# Patient Record
Sex: Male | Born: 1940 | Race: White | Hispanic: No | Marital: Married | Smoking: Former smoker
Health system: Southern US, Community
[De-identification: ages and names within clinical notes are randomized; demographics above are authoritative.]

## PROBLEM LIST (undated history)

## (undated) DIAGNOSIS — E119 Type 2 diabetes mellitus without complications: Secondary | ICD-10-CM

## (undated) DIAGNOSIS — I2510 Atherosclerotic heart disease of native coronary artery without angina pectoris: Secondary | ICD-10-CM

## (undated) DIAGNOSIS — Z8612 Personal history of poliomyelitis: Secondary | ICD-10-CM

## (undated) DIAGNOSIS — I714 Abdominal aortic aneurysm, without rupture: Secondary | ICD-10-CM

## (undated) DIAGNOSIS — N183 Chronic kidney disease, stage 3 unspecified: Secondary | ICD-10-CM

## (undated) DIAGNOSIS — I10 Essential (primary) hypertension: Secondary | ICD-10-CM

## (undated) DIAGNOSIS — K635 Polyp of colon: Secondary | ICD-10-CM

## (undated) DIAGNOSIS — M1990 Unspecified osteoarthritis, unspecified site: Secondary | ICD-10-CM

## (undated) DIAGNOSIS — I6521 Occlusion and stenosis of right carotid artery: Secondary | ICD-10-CM

## (undated) DIAGNOSIS — E785 Hyperlipidemia, unspecified: Secondary | ICD-10-CM

## (undated) DIAGNOSIS — I739 Peripheral vascular disease, unspecified: Secondary | ICD-10-CM

## (undated) DIAGNOSIS — G4730 Sleep apnea, unspecified: Secondary | ICD-10-CM

## (undated) DIAGNOSIS — G709 Myoneural disorder, unspecified: Secondary | ICD-10-CM

## (undated) HISTORY — DX: Peripheral vascular disease, unspecified: I73.9

## (undated) HISTORY — DX: Personal history of poliomyelitis: Z86.12

## (undated) HISTORY — DX: Occlusion and stenosis of right carotid artery: I65.21

## (undated) HISTORY — DX: Chronic kidney disease, stage 3 unspecified: N18.30

## (undated) HISTORY — DX: Essential (primary) hypertension: I10

## (undated) HISTORY — PX: CATARACT EXTRACTION W/ INTRAOCULAR LENS IMPLANT: SHX1309

## (undated) HISTORY — PX: TONSILLECTOMY: SUR1361

## (undated) HISTORY — DX: Type 2 diabetes mellitus without complications: E11.9

## (undated) HISTORY — DX: Polyp of colon: K63.5

## (undated) HISTORY — DX: Atherosclerotic heart disease of native coronary artery without angina pectoris: I25.10

## (undated) HISTORY — DX: Myoneural disorder, unspecified: G70.9

## (undated) HISTORY — DX: Abdominal aortic aneurysm, without rupture: I71.4

## (undated) HISTORY — DX: Chronic kidney disease, stage 3 (moderate): N18.3

## (undated) HISTORY — PX: CORONARY ANGIOPLASTY: SHX604

## (undated) HISTORY — DX: Hyperlipidemia, unspecified: E78.5

---
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
## 2006-03-13 LAB — HM COLONOSCOPY

## 2009-12-15 ENCOUNTER — Encounter: Admission: RE | Admit: 2009-12-15 | Discharge: 2009-12-15 | Payer: Self-pay | Admitting: Family Medicine

## 2010-03-17 ENCOUNTER — Ambulatory Visit (HOSPITAL_COMMUNITY)
Admission: RE | Admit: 2010-03-17 | Discharge: 2010-03-17 | Payer: Self-pay | Source: Home / Self Care | Attending: Family Medicine | Admitting: Family Medicine

## 2010-10-12 DIAGNOSIS — I714 Abdominal aortic aneurysm, without rupture, unspecified: Secondary | ICD-10-CM

## 2010-10-12 HISTORY — DX: Abdominal aortic aneurysm, without rupture, unspecified: I71.40

## 2010-10-12 HISTORY — DX: Abdominal aortic aneurysm, without rupture: I71.4

## 2010-10-17 ENCOUNTER — Other Ambulatory Visit: Payer: Self-pay | Admitting: Family Medicine

## 2010-10-17 DIAGNOSIS — M545 Low back pain: Secondary | ICD-10-CM

## 2010-10-17 DIAGNOSIS — M5410 Radiculopathy, site unspecified: Secondary | ICD-10-CM

## 2010-10-19 ENCOUNTER — Ambulatory Visit (HOSPITAL_COMMUNITY)
Admission: RE | Admit: 2010-10-19 | Discharge: 2010-10-19 | Disposition: A | Discharge status: Home / Self Care | Payer: Medicare HMO | Source: Ambulatory Visit | Attending: Family Medicine | Admitting: Family Medicine

## 2010-10-19 DIAGNOSIS — M51379 Other intervertebral disc degeneration, lumbosacral region without mention of lumbar back pain or lower extremity pain: Secondary | ICD-10-CM | POA: Insufficient documentation

## 2010-10-19 DIAGNOSIS — M545 Low back pain, unspecified: Secondary | ICD-10-CM

## 2010-10-19 DIAGNOSIS — M5410 Radiculopathy, site unspecified: Secondary | ICD-10-CM

## 2010-10-19 DIAGNOSIS — M5137 Other intervertebral disc degeneration, lumbosacral region: Secondary | ICD-10-CM | POA: Insufficient documentation

## 2010-11-04 ENCOUNTER — Other Ambulatory Visit: Payer: Self-pay | Admitting: Family Medicine

## 2010-11-04 DIAGNOSIS — M545 Low back pain: Secondary | ICD-10-CM

## 2010-11-04 DIAGNOSIS — M79604 Pain in right leg: Secondary | ICD-10-CM

## 2010-11-07 ENCOUNTER — Ambulatory Visit
Admission: RE | Admit: 2010-11-07 | Discharge: 2010-11-07 | Disposition: A | Discharge status: Home / Self Care | Payer: Medicare HMO | Source: Ambulatory Visit | Attending: Family Medicine | Admitting: Family Medicine

## 2010-11-07 DIAGNOSIS — M79604 Pain in right leg: Secondary | ICD-10-CM

## 2010-11-07 DIAGNOSIS — M545 Low back pain: Secondary | ICD-10-CM

## 2010-11-07 MED ORDER — METHYLPREDNISOLONE ACETATE 40 MG/ML INJ SUSP (RADIOLOG
120.0000 mg | Freq: Once | INTRAMUSCULAR | Status: AC
  Administered 2010-11-07: 120 mg via EPIDURAL

## 2010-11-07 MED ORDER — IOHEXOL 180 MG/ML  SOLN
1.0000 mL | Freq: Once | INTRAMUSCULAR | Status: AC | PRN
  Administered 2010-11-07: 1 mL via EPIDURAL

## 2010-12-02 ENCOUNTER — Other Ambulatory Visit: Payer: Self-pay | Admitting: Family Medicine

## 2010-12-02 DIAGNOSIS — M79604 Pain in right leg: Secondary | ICD-10-CM

## 2010-12-08 ENCOUNTER — Other Ambulatory Visit: Payer: Self-pay | Admitting: Family Medicine

## 2010-12-08 ENCOUNTER — Ambulatory Visit
Admission: RE | Admit: 2010-12-08 | Discharge: 2010-12-08 | Disposition: A | Discharge status: Home / Self Care | Payer: Medicare HMO | Source: Ambulatory Visit | Attending: Family Medicine | Admitting: Family Medicine

## 2010-12-08 DIAGNOSIS — M79604 Pain in right leg: Secondary | ICD-10-CM

## 2010-12-08 MED ORDER — IOHEXOL 180 MG/ML  SOLN
1.0000 mL | Freq: Once | INTRAMUSCULAR | Status: AC | PRN
  Administered 2010-12-08: 1 mL via EPIDURAL

## 2010-12-08 MED ORDER — METHYLPREDNISOLONE ACETATE 40 MG/ML INJ SUSP (RADIOLOG
120.0000 mg | Freq: Once | INTRAMUSCULAR | Status: AC
  Administered 2010-12-08: 120 mg via EPIDURAL

## 2011-12-01 ENCOUNTER — Other Ambulatory Visit: Payer: Self-pay | Admitting: Family Medicine

## 2011-12-01 DIAGNOSIS — I714 Abdominal aortic aneurysm, without rupture: Secondary | ICD-10-CM

## 2011-12-07 ENCOUNTER — Ambulatory Visit (HOSPITAL_COMMUNITY)
Admission: RE | Admit: 2011-12-07 | Discharge: 2011-12-07 | Disposition: A | Discharge status: Home / Self Care | Payer: Medicare Other | Source: Ambulatory Visit | Attending: Family Medicine | Admitting: Family Medicine

## 2011-12-07 ENCOUNTER — Other Ambulatory Visit: Payer: Self-pay | Admitting: Family Medicine

## 2011-12-07 DIAGNOSIS — E7800 Pure hypercholesterolemia, unspecified: Secondary | ICD-10-CM | POA: Insufficient documentation

## 2011-12-07 DIAGNOSIS — I714 Abdominal aortic aneurysm, without rupture, unspecified: Secondary | ICD-10-CM

## 2011-12-07 DIAGNOSIS — E119 Type 2 diabetes mellitus without complications: Secondary | ICD-10-CM | POA: Insufficient documentation

## 2011-12-07 DIAGNOSIS — I10 Essential (primary) hypertension: Secondary | ICD-10-CM | POA: Insufficient documentation

## 2011-12-27 ENCOUNTER — Encounter: Payer: Self-pay | Admitting: *Deleted

## 2011-12-27 ENCOUNTER — Encounter: Payer: Self-pay | Admitting: Vascular Surgery

## 2012-01-01 ENCOUNTER — Encounter: Payer: Self-pay | Admitting: Vascular Surgery

## 2012-01-02 ENCOUNTER — Ambulatory Visit (INDEPENDENT_AMBULATORY_CARE_PROVIDER_SITE_OTHER): Payer: Medicare Other | Admitting: Vascular Surgery

## 2012-01-02 ENCOUNTER — Encounter: Payer: Self-pay | Admitting: Vascular Surgery

## 2012-01-02 VITALS — BP 169/69 | HR 97 | Resp 20 | Ht 71.0 in | Wt 233.0 lb

## 2012-01-02 DIAGNOSIS — I714 Abdominal aortic aneurysm, without rupture, unspecified: Secondary | ICD-10-CM | POA: Insufficient documentation

## 2012-01-02 NOTE — Progress Notes (Signed)
Subjective:     Patient ID: Juan [name], male   DOB: March 13, 1941, 71 y.o.   MRN: [id number]  HPI this 71 year old male is referred for examination regarding abdominal aortic aneurysm. Patient had an MR scan about 14 months ago for back problems and was found to have a 3.5 cm infrarenal abdominal aortic aneurysm. He had injections for his back problem and that his resolved. In September of this year he had ultrasound which revealed enlargement of the aneurysm to 3.7 x 4.1 cm in maximum diameter. Patient has had no abnormal or new back symptoms.  Past Medical History  Diagnosis Date  . Diabetes mellitus without complication   . Hypertension   . Hyperlipidemia   . AAA (abdominal aortic aneurysm)   . CAD (coronary artery disease)     History  Substance Use Topics  . Smoking status: Former Smoker -- 35 years    Types: Cigarettes    Quit date: 01/02/1988  . Smokeless tobacco: Never Used  . Alcohol Use: Yes    Family History  Problem Relation Age of Onset  . Diabetes Mother   . Heart disease Mother   . Diabetes Father   . Heart disease Father     No Known Allergies  Current outpatient prescriptions:allopurinol (ZYLOPRIM) 300 MG tablet, Take 300 mg by mouth daily., Disp: , Rfl: ;  aspirin 325 MG EC tablet, Take 325 mg by mouth daily., Disp: , Rfl: ;  diltiazem (DILACOR XR) 240 MG 24 hr capsule, Take 240 mg by mouth daily., Disp: , Rfl: ;  glipiZIDE (GLUCOTROL) 5 MG tablet, Take 5 mg by mouth 2 (two) times daily before a meal., Disp: , Rfl:  lisinopril (PRINIVIL,ZESTRIL) 40 MG tablet, Take 40 mg by mouth daily., Disp: , Rfl: ;  metFORMIN (GLUCOPHAGE) 1000 MG tablet, Take 1,000 mg by mouth 2 (two) times daily with a meal., Disp: , Rfl: ;  simvastatin (ZOCOR) 20 MG tablet, Take 20 mg by mouth every evening., Disp: , Rfl: ;  hydrochlorothiazide (HYDRODIURIL) 25 MG tablet, Take 25 mg by mouth daily., Disp: , Rfl:   BP 169/69  Pulse 97  Resp 20  Ht 5\' 11"  (1.803 m)  Wt 233 lb (105.688  kg)  BMI 32.50 kg/m2  Body mass index is 32.50 kg/(m^2).          Review of Systems denies chest pain, dyspnea on exertion, PND, orthopnea, claudication. Has a remote history of PT CA in 1989 this had no cardiac symptoms since that time. Other systems negative and complete review of systems    Objective:   Physical Exam blood pressure 169/69 heart rate 97 respirations 20 Gen.-alert and oriented x3 in no apparent distress HEENT normal for age Lungs no rhonchi or wheezing Cardiovascular regular rhythm no murmurs carotid pulses 3+ palpable no bruits audible Abdomen soft nontender no palpable masses Musculoskeletal free of  major deformities Skin clear -no rashes Neurologic normal Lower extremities 3+ femoral and dorsalis pedis pulses palpable bilaterally with no edema  I reviewed the ultrasound study report performed at [facility] on 12/07/2011 which reveals 3.7 x 4.1 cm AAA     Assessment:     3.7 x 4.1 cm AAA the ultrasound September of 2013    Plan:     Return in one year with duplex scan of the abdominal aortic aneurysm and see nurse practitioner

## 2012-01-03 NOTE — Addendum Note (Signed)
Addended by: Sharee Pimple on: 01/03/2012 08:35 AM   Modules accepted: Orders

## 2012-05-11 ENCOUNTER — Encounter: Payer: Self-pay | Admitting: Family Medicine

## 2012-05-11 DIAGNOSIS — E119 Type 2 diabetes mellitus without complications: Secondary | ICD-10-CM | POA: Insufficient documentation

## 2012-05-11 DIAGNOSIS — I2510 Atherosclerotic heart disease of native coronary artery without angina pectoris: Secondary | ICD-10-CM | POA: Insufficient documentation

## 2012-05-11 DIAGNOSIS — E785 Hyperlipidemia, unspecified: Secondary | ICD-10-CM | POA: Insufficient documentation

## 2012-08-20 ENCOUNTER — Other Ambulatory Visit: Payer: Self-pay | Admitting: Family Medicine

## 2012-08-20 DIAGNOSIS — Z79899 Other long term (current) drug therapy: Secondary | ICD-10-CM

## 2012-08-20 DIAGNOSIS — E785 Hyperlipidemia, unspecified: Secondary | ICD-10-CM

## 2012-08-20 DIAGNOSIS — Z125 Encounter for screening for malignant neoplasm of prostate: Secondary | ICD-10-CM

## 2012-08-20 DIAGNOSIS — E119 Type 2 diabetes mellitus without complications: Secondary | ICD-10-CM

## 2012-08-22 ENCOUNTER — Other Ambulatory Visit (INDEPENDENT_AMBULATORY_CARE_PROVIDER_SITE_OTHER): Payer: Medicare Other

## 2012-08-22 DIAGNOSIS — E785 Hyperlipidemia, unspecified: Secondary | ICD-10-CM

## 2012-08-22 DIAGNOSIS — Z79899 Other long term (current) drug therapy: Secondary | ICD-10-CM

## 2012-08-22 DIAGNOSIS — Z125 Encounter for screening for malignant neoplasm of prostate: Secondary | ICD-10-CM

## 2012-08-22 DIAGNOSIS — E119 Type 2 diabetes mellitus without complications: Secondary | ICD-10-CM

## 2012-08-22 LAB — CBC WITH DIFFERENTIAL/PLATELET
Basophils Absolute: 0 10*3/uL (ref 0.0–0.1)
Basophils Relative: 1 % (ref 0–1)
HCT: 37.9 % — ABNORMAL LOW (ref 39.0–52.0)
Lymphocytes Relative: 30 % (ref 12–46)
MCHC: 33.2 g/dL (ref 30.0–36.0)
Monocytes Absolute: 0.4 10*3/uL (ref 0.1–1.0)
Neutro Abs: 3.9 10*3/uL (ref 1.7–7.7)
Neutrophils Relative %: 59 % (ref 43–77)
RDW: 14.4 % (ref 11.5–15.5)
WBC: 6.5 10*3/uL (ref 4.0–10.5)

## 2012-08-22 LAB — COMPREHENSIVE METABOLIC PANEL
ALT: 32 U/L (ref 0–53)
AST: 24 U/L (ref 0–37)
Albumin: 4.3 g/dL (ref 3.5–5.2)
Alkaline Phosphatase: 59 U/L (ref 39–117)
BUN: 21 mg/dL (ref 6–23)
Calcium: 9.5 mg/dL (ref 8.4–10.5)
Chloride: 104 mEq/L (ref 96–112)
Potassium: 4.4 mEq/L (ref 3.5–5.3)
Sodium: 142 mEq/L (ref 135–145)
Total Protein: 6.4 g/dL (ref 6.0–8.3)

## 2012-08-22 LAB — LIPID PANEL
Cholesterol: 156 mg/dL (ref 0–200)
LDL Cholesterol: 77 mg/dL (ref 0–99)
Total CHOL/HDL Ratio: 4.3 Ratio
VLDL: 43 mg/dL — ABNORMAL HIGH (ref 0–40)

## 2012-08-27 ENCOUNTER — Encounter: Payer: Self-pay | Admitting: Family Medicine

## 2012-08-27 ENCOUNTER — Ambulatory Visit (INDEPENDENT_AMBULATORY_CARE_PROVIDER_SITE_OTHER): Payer: Medicare Other | Admitting: Family Medicine

## 2012-08-27 VITALS — BP 146/68 | HR 72 | Temp 98.0°F | Resp 16 | Ht 71.0 in | Wt 236.0 lb

## 2012-08-27 DIAGNOSIS — R198 Other specified symptoms and signs involving the digestive system and abdomen: Secondary | ICD-10-CM

## 2012-08-27 DIAGNOSIS — Z1211 Encounter for screening for malignant neoplasm of colon: Secondary | ICD-10-CM

## 2012-08-27 DIAGNOSIS — Z23 Encounter for immunization: Secondary | ICD-10-CM

## 2012-08-27 DIAGNOSIS — E1149 Type 2 diabetes mellitus with other diabetic neurological complication: Secondary | ICD-10-CM

## 2012-08-27 DIAGNOSIS — Z0000 Encounter for general adult medical examination without abnormal findings: Secondary | ICD-10-CM

## 2012-08-27 DIAGNOSIS — Z2911 Encounter for prophylactic immunotherapy for respiratory syncytial virus (RSV): Secondary | ICD-10-CM

## 2012-08-27 DIAGNOSIS — K6289 Other specified diseases of anus and rectum: Secondary | ICD-10-CM

## 2012-08-27 NOTE — Progress Notes (Signed)
Subjective:    Patient ID: Juan [name], male    DOB: 09/22/1940, 72 y.o.   MRN: [id number]  HPI  Patient is here today for complete physical exam.  He is also here to discuss his lab values with regards to his diabetes and hyperlipidemia. He also complains of a lump around his rectum that has been present for one month. He denies melena or hematochezia. He denies pain with defecation.  He is due for his next colonoscopy due to his history of colon polyps.  He has had a Pneumovax in 2005. He would like to get the shingle shot today. His tetanus shot is over due that he would like to defer today. Appointment on 08/22/2012  Component Date Value Range Status  . WBC 08/22/2012 6.5  4.0 - 10.5 K/uL Final  . RBC 08/22/2012 4.28  4.22 - 5.81 MIL/uL Final  . Hemoglobin 08/22/2012 12.6* 13.0 - 17.0 g/dL Final  . HCT 08/65/7846 37.9* 39.0 - 52.0 % Final  . MCV 08/22/2012 88.6  78.0 - 100.0 fL Final  . MCH 08/22/2012 29.4  26.0 - 34.0 pg Final  . MCHC 08/22/2012 33.2  30.0 - 36.0 g/dL Final  . RDW 96/29/5284 14.4  11.5 - 15.5 % Final  . Platelets 08/22/2012 161  150 - 400 K/uL Final  . Neutrophils Relative % 08/22/2012 59  43 - 77 % Final  . Neutro Abs 08/22/2012 3.9  1.7 - 7.7 K/uL Final  . Lymphocytes Relative 08/22/2012 30  12 - 46 % Final  . Lymphs Abs 08/22/2012 1.9  0.7 - 4.0 K/uL Final  . Monocytes Relative 08/22/2012 7  3 - 12 % Final  . Monocytes Absolute 08/22/2012 0.4  0.1 - 1.0 K/uL Final  . Eosinophils Relative 08/22/2012 3  0 - 5 % Final  . Eosinophils Absolute 08/22/2012 0.2  0.0 - 0.7 K/uL Final  . Basophils Relative 08/22/2012 1  0 - 1 % Final  . Basophils Absolute 08/22/2012 0.0  0.0 - 0.1 K/uL Final  . Smear Review 08/22/2012 Criteria for review not met   Final  . Sodium 08/22/2012 142  135 - 145 mEq/L Final  . Potassium 08/22/2012 4.4  3.5 - 5.3 mEq/L Final  . Chloride 08/22/2012 104  96 - 112 mEq/L Final  . CO2 08/22/2012 26  19 - 32 mEq/L Final  . Glucose, Bld  08/22/2012 179* 70 - 99 mg/dL Final  . BUN 13/24/4010 21  6 - 23 mg/dL Final  . Creat 27/25/3664 1.29  0.50 - 1.35 mg/dL Final  . Total Bilirubin 08/22/2012 0.4  0.3 - 1.2 mg/dL Final  . Alkaline Phosphatase 08/22/2012 59  39 - 117 U/L Final  . AST 08/22/2012 24  0 - 37 U/L Final  . ALT 08/22/2012 32  0 - 53 U/L Final  . Total Protein 08/22/2012 6.4  6.0 - 8.3 g/dL Final  . Albumin 40/34/7425 4.3  3.5 - 5.2 g/dL Final  . Calcium 95/63/8756 9.5  8.4 - 10.5 mg/dL Final  . Hemoglobin E3P 08/22/2012 6.4* <5.7 % Final   Comment:  According to the ADA Clinical Practice Recommendations for 2011, when                          HbA1c is used as a screening test:                                                       >=6.5%   Diagnostic of Diabetes Mellitus                                     (if abnormal result is confirmed)                                                     5.7-6.4%   Increased risk of developing Diabetes Mellitus                                                     References:Diagnosis and Classification of Diabetes Mellitus,Diabetes                          Care,2011,34(Suppl 1):S62-S69 and Standards of Medical Care in                                  Diabetes - 2011,Diabetes Care,2011,34 (Suppl 1):S11-S61.                             . Mean Plasma Glucose 08/22/2012 137* <117 mg/dL Final  . Cholesterol 16/12/9602 156  0 - 200 mg/dL Final   Comment: ATP III Classification:                                < 200        mg/dL        Desirable                               200 - 239     mg/dL        Borderline High                               >= 240        mg/dL        High                             . Triglycerides 08/22/2012 215* <150 mg/dL Final  . HDL 54/11/8117 36* >39 mg/dL Final  . Total CHOL/HDL Ratio 08/22/2012 4.3   Final  . VLDL 08/22/2012 43* 0 - 40 mg/dL Final  . LDL  Cholesterol 08/22/2012 77  0 - 99 mg/dL Final   Comment:                            Total Cholesterol/HDL Ratio:CHD Risk                                                 Coronary Heart Disease Risk Table                                                                 Men       Women                                   1/2 Average Risk              3.4        3.3                                       Average Risk              5.0        4.4                                    2X Average Risk              9.6        7.1                                    3X Average Risk             23.4       11.0                          Use the calculated Patient Ratio above and the CHD Risk table                           to determine the patient's CHD Risk.                          ATP III Classification (LDL):                                < 100        mg/dL         Optimal                               100 - 129     mg/dL  Near or Above Optimal                               130 - 159     mg/dL         Borderline High                               160 - 189     mg/dL         High                                > 190        mg/dL         Very High                             . PSA 08/22/2012 0.61  <=4.00 ng/mL Final   Comment: Test Methodology: ECLIA PSA (Electrochemiluminescence Immunoassay)                                                     For PSA values from 2.5-4.0, particularly in younger men <60 years                          old, the AUA and NCCN suggest testing for % Free PSA (3515) and                          evaluation of the rate of increase in PSA (PSA velocity).   these are significant for an LDL of 77, triglycerides of 215, HDL of 36, and hemoglobin W0J of 6.4. Past Medical History  Diagnosis Date  . AAA (abdominal aortic aneurysm) 8/12    3.5cm  . CAD (coronary artery disease)   . Colon polyps   . Diabetes mellitus without complication   . Hyperlipidemia   . Hypertension   .  Neuromuscular disorder     L2-3,L5-S1 bulging disc /nerve impingement  . 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 Tonsillectomy    . Coronary angioplasty      1989   Current Outpatient Prescriptions on File Prior to Visit  Medication Sig Dispense Refill  . allopurinol (ZYLOPRIM) 300 MG tablet Take 300 mg by mouth daily.      Marland Kitchen aspirin 325 MG EC tablet Take 325 mg by mouth daily.      Marland Kitchen diltiazem (DILACOR XR) 240 MG 24 hr capsule Take 240 mg by mouth daily.      Marland Kitchen glipiZIDE (GLUCOTROL) 5 MG tablet Take 5 mg by mouth 2 (two) times daily before a meal.      . lisinopril (PRINIVIL,ZESTRIL) 40 MG tablet Take 40 mg by mouth daily.      . metFORMIN (GLUCOPHAGE) 1000 MG tablet Take 1,000 mg by mouth 2 (two) times daily with a meal.      . metoprolol succinate (TOPROL-XL) 25 MG 24 hr tablet Take 25 mg by mouth daily.      . simvastatin (ZOCOR) 20  MG tablet Take 20 mg by mouth every evening.       No current facility-administered medications on file prior to visit.   No Known Allergies History   Social History  . Marital Status: Married    Spouse Name: N/A    Number of Children: N/A  . Years of Education: N/A   Occupational History  . Not on file.   Social History Main Topics  . Smoking status: Former Smoker -- 35 years    Types: Cigarettes    Quit date: 01/02/1988  . Smokeless tobacco: Never Used  . Alcohol Use: Yes  . Drug Use: No  . Sexually Active: Not on file   Other Topics Concern  . Not on file   Social History Narrative  . No narrative on file   Family History  Problem Relation Age of Onset  . Diabetes Mother   . Heart disease Mother   . Diabetes Father   . Heart disease Father      Review of Systems  All other systems reviewed and are negative.       Objective:   Physical Exam  Vitals reviewed. Constitutional: He is oriented to person, place, and time. He appears well-developed and  well-nourished. No distress.  HENT:  Head: Normocephalic and atraumatic.  Right Ear: External ear normal.  Left Ear: External ear normal.  Nose: Nose normal.  Mouth/Throat: Oropharynx is clear and moist.  Eyes: Conjunctivae and EOM are normal. Pupils are equal, round, and reactive to light. Right eye exhibits no discharge. Left eye exhibits no discharge. No scleral icterus.  Neck: Normal range of motion. Neck supple. No JVD present. No tracheal deviation present. No thyromegaly present.  Cardiovascular: Normal rate, regular rhythm, normal heart sounds and intact distal pulses.  Exam reveals no gallop and no friction rub.   No murmur heard. Pulmonary/Chest: Effort normal and breath sounds normal. No stridor. No respiratory distress. He has no wheezes. He has no rales. He exhibits no tenderness.  Abdominal: Soft. Bowel sounds are normal. He exhibits no distension and no mass. There is no tenderness. There is no rebound and no guarding.  Genitourinary: Prostate normal. Rectal exam shows mass (final palpable lump approximately 1 cm x 2 cm at 10:00.  Location would be consistent with a hemorrhoid. However the appearance and texture did not feel like a hemorrhoid.).  Musculoskeletal: Normal range of motion. He exhibits no edema and no tenderness.  Lymphadenopathy:    He has no cervical adenopathy.  Neurological: He is alert and oriented to person, place, and time. He has normal reflexes. He displays normal reflexes. No cranial nerve deficit. He exhibits normal muscle tone. Coordination normal.  Skin: Skin is warm and dry. No rash noted. He is not diaphoretic. No erythema. No pallor.  Psychiatric: He has a normal mood and affect. His behavior is normal. Judgment and thought content normal.          Assessment & Plan:  1. Routine general medical examination at a health care facility Physical exam Ms. normal except for mildly elevated blood pressure and a rectal mass. I've asked the patient to  check his blood pressure daily for one to 2 weeks and then allow me to review these readings. I would increase his medication if his blood pressures greater than 140/90. Patient was given a shingles shot today in clinic. His other vaccines are up-to-date. His cancer screening is up to date except for colonoscopy.  His diabetes is well-controlled. His cholesterol significantly  low HDL and elevated triglyceride level. I have asked the patient to begin increasing his aerobic exercise to try to increase his HDL and decrease his triglycerides. I also recommended a low carbohydrate low saturated fat diet  2. Need for prophylactic vaccination and inoculation against unspecified single disease - Varicella-zoster vaccine subcutaneous  3. Screening for colon cancer - Ambulatory referral to Gastroenterology  4. Rectal mass Location would be consistent with a hemorrhoid. However the mass does not appear like a hemorrhoid and does not feel like a hemorrhoid. Therefore I will consult GI.  He is most likely has a thrombosed hemorrhoid but the patient requires a colonoscopy. - Ambulatory referral to Gastroenterology  5. Type II or unspecified type diabetes mellitus with neurological manifestations, not stated as uncontrolled(250.60) Patient has a history of neuropathy. Otherwise his diabetes appears well controlled. I did recommend annual eye exam.

## 2012-09-05 ENCOUNTER — Encounter: Payer: Self-pay | Admitting: Gastroenterology

## 2012-09-09 ENCOUNTER — Encounter: Payer: Self-pay | Admitting: Gastroenterology

## 2012-09-09 ENCOUNTER — Ambulatory Visit (INDEPENDENT_AMBULATORY_CARE_PROVIDER_SITE_OTHER): Payer: Medicare Other | Admitting: Gastroenterology

## 2012-09-09 VITALS — BP 133/73 | HR 80 | Temp 97.3°F | Ht 70.5 in | Wt 234.0 lb

## 2012-09-09 DIAGNOSIS — Z860100 Personal history of colon polyps, unspecified: Secondary | ICD-10-CM | POA: Insufficient documentation

## 2012-09-09 MED ORDER — HYDROCORTISONE ACETATE 25 MG RE SUPP: 25.0000 mg | Freq: Two times a day (BID) | RECTAL | Status: DC

## 2012-09-09 MED ORDER — PEG 3350-KCL-NA BICARB-NACL 420 G PO SOLR: 4000.0000 mL | ORAL | Status: DC

## 2012-09-09 NOTE — Patient Instructions (Addendum)
Start taking Anusol twice a day for 6 days. I have sent this to the pharmacy.   We have scheduled you for a colonoscopy with Dr. Jena Gauss in the near future.

## 2012-09-09 NOTE — Progress Notes (Signed)
Referring Provider: Pickard, Warren T, MD Primary Care Physician:  PICKARD,WARREN TOM, MD Primary Gastroenterologist:  Dr. Rourk   Chief Complaint  Patient presents with  . Colonoscopy    HPI:   Juan [name] is a pleasant 72-year-old male presenting at the request of Dr. Pickard for surveillance colonoscopy. He was also noted to have a possible hemorrhoid on rectal exam recently.   Last colonoscopy in 2008 in [place], unsure who completed this. History of polyps in 2003. In 2008, 3 polyps. Denies rectal bleeding, no rectal discomfort. Notes rectal lump that has recurred; notes incidence of this in the past. No constipation or loose stools. No weight loss or lack of appetite. Walking more for exercise. Rare indigestion with spicy foods. No dysphagia.   Past Medical History  Diagnosis Date  . AAA (abdominal aortic aneurysm) 8/12    3.5cm  . CAD (coronary artery disease)   . Colon polyps   . Diabetes mellitus without complication   . Hyperlipidemia   . Hypertension   . Neuromuscular disorder     L2-3,L5-S1 bulging disc /nerve impingement  . 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 Tonsillectomy    . Coronary angioplasty      1989    Current Outpatient Prescriptions  Medication Sig Dispense Refill  . allopurinol (ZYLOPRIM) 300 MG tablet Take 300 mg by mouth daily.      . aspirin 325 MG EC tablet Take 325 mg by mouth daily.      . diltiazem (DILACOR XR) 240 MG 24 hr capsule Take 240 mg by mouth daily.      . glipiZIDE (GLUCOTROL) 5 MG tablet Take 5 mg by mouth 2 (two) times daily before a meal.      . lisinopril (PRINIVIL,ZESTRIL) 40 MG tablet Take 40 mg by mouth daily.      . metFORMIN (GLUCOPHAGE) 1000 MG tablet Take 1,000 mg by mouth 2 (two) times daily with a meal.      . metoprolol succinate (TOPROL-XL) 25 MG 24 hr tablet Take 25 mg by mouth daily.      . simvastatin (ZOCOR) 20 MG tablet Take 20 mg by mouth  every evening.       No current facility-administered medications for this visit.    Allergies as of 09/09/2012  . (No Known Allergies)    Family History  Problem Relation Age of Onset  . Diabetes Mother   . Heart disease Mother   . Diabetes Father   . Heart disease Father   . Colon cancer Neg Hx   . Brain cancer Sister     History   Social History  . Marital Status: Married    Spouse Name: N/A    Number of Children: N/A  . Years of Education: N/A   Occupational History  . retired     Automotive    Social History Main Topics  . Smoking status: Former Smoker -- 35 years    Types: Cigarettes    Quit date: 01/02/1988  . Smokeless tobacco: Never Used  . Alcohol Use: Yes     Comment: rare beer, no history of ETOH abuse  . Drug Use: No  . Sexually Active: Not on file   Other Topics Concern  . Not on file   Social History Narrative  . No narrative on file    Review of Systems: Gen: Denies any fever, chills, loss of appetite, fatigue, weight loss. CV:   Denies chest pain, heart palpitations, syncope, peripheral edema. Resp: Denies shortness of breath with rest, cough, wheezing GI: see HPI GU : Denies urinary burning, urinary frequency, urinary incontinence.  MS: occasional joint pain Derm: Denies rash, itching, dry skin Psych: Denies depression, anxiety, confusion or memory loss  Heme: Denies bruising, bleeding, and enlarged lymph nodes.  Physical Exam: BP 133/73  Pulse 80  Temp(Src) 97.3 F (36.3 C) (Oral)  Ht 5' 10.5" (1.791 m)  Wt 234 lb (106.142 kg)  BMI 33.09 kg/m2 General:   Alert and oriented. Well-developed, well-nourished, pleasant and cooperative. Head:  Normocephalic and atraumatic. Eyes:  Conjunctiva pink, sclera clear, no icterus.   Conjunctiva pink. Ears:  Normal auditory acuity. Nose:  No deformity, discharge,  or lesions. Mouth:  No deformity or lesions, mucosa pink and moist.  Neck:  Supple, without mass or thyromegaly. Lungs:  Clear to  auscultation bilaterally, without wheezing, rales, or rhonchi.  Heart:  S1, S2 present without murmurs noted.  Abdomen:  +BS, soft, non-tender and non-distended. Without mass or HSM. No rebound or guarding. No hernias noted. Rectal: internal exam with small, pea-sized "lump" around 8oclock just inside rectum, no pain or gross blood Msk:  Symmetrical without gross deformities. Normal posture. Extremities:  Without clubbing or edema. Neurologic:  Alert and  oriented x4;  grossly normal neurologically. Skin:  Intact, warm and dry without significant lesions or rashes Cervical Nodes:  No significant cervical adenopathy. Psych:  Alert and cooperative. Normal mood and affect.   

## 2012-09-10 ENCOUNTER — Telehealth: Payer: Self-pay | Admitting: Family Medicine

## 2012-09-10 MED ORDER — HYDROCHLOROTHIAZIDE 12.5 MG PO CAPS: 12.5000 mg | ORAL_CAPSULE | Freq: Every day | ORAL | Status: DC

## 2012-09-10 NOTE — Telephone Encounter (Signed)
Per Dr. Tanya Nones.....Marland KitchenMarland KitchenRecently his bp has been too high. I'd like to add HCTZ 12.5 mg poqday and recheck BP in 1 month. Can he tolerate diarrhea if metformin was the cause or does he want to switch to a different medication?     Pt is aware and would like to change his Metformin to something else. BP med sent to pharmacy.

## 2012-09-10 NOTE — Telephone Encounter (Signed)
Sandy, I reviewed his LOV note 08/27/12 and his last labs 08/22/12.  I want to make sure I understand your message correctly. 1-his bp has been high so you have added hctz 2- he is having diarrhea, which he thinks is secondary to metformin. Is this correct? If so...regarding the diarrhea: Med list shows he is on Metformin 1,000mg  BID-confirm this is correct. If so, then rather than stopping the metformin completely, would decrease it to 500 mg BID and see if diarrhea improves. Tell him to decrease to 500mg  BID for 2 weeks then let us know whether diarrhea is better or not. (If any of above is not correct, let me know)

## 2012-09-11 ENCOUNTER — Other Ambulatory Visit: Payer: Self-pay | Admitting: Internal Medicine

## 2012-09-11 NOTE — Progress Notes (Signed)
CC PCP 

## 2012-09-11 NOTE — Assessment & Plan Note (Signed)
72 year old male with history of polyps, due for surveillance now. Last colonoscopy in [place] around 2008, and patient is unsure where this was performed. Notes intermittent, recurrent rectal "bump", just inside the rectum, without associated discomfort or bleeding. Internal exam completed today with confirmation of firm, almost pea-sized area that may be a benign finding. He may have internal hemorrhoids, and I have provided Anusol in the interim until colonoscopy can be completed.   Proceed with TCS with Dr. Jena Gauss in near future: the risks, benefits, and alternatives have been discussed with the patient in detail. The patient states understanding and desires to proceed.

## 2012-09-12 ENCOUNTER — Encounter (HOSPITAL_COMMUNITY): Payer: Self-pay | Admitting: Pharmacy Technician

## 2012-09-12 NOTE — Telephone Encounter (Signed)
Patient aware.

## 2012-09-27 ENCOUNTER — Encounter (HOSPITAL_COMMUNITY)
Admission: RE | Disposition: A | Discharge status: Home / Self Care | Payer: Self-pay | Source: Ambulatory Visit | Attending: Internal Medicine

## 2012-09-27 ENCOUNTER — Ambulatory Visit (HOSPITAL_COMMUNITY)
Admission: RE | Admit: 2012-09-27 | Discharge: 2012-09-27 | Disposition: A | Discharge status: Home / Self Care | Payer: Medicare Other | Source: Ambulatory Visit | Attending: Internal Medicine | Admitting: Internal Medicine

## 2012-09-27 ENCOUNTER — Encounter (HOSPITAL_COMMUNITY): Payer: Self-pay

## 2012-09-27 DIAGNOSIS — E119 Type 2 diabetes mellitus without complications: Secondary | ICD-10-CM | POA: Insufficient documentation

## 2012-09-27 DIAGNOSIS — Z87891 Personal history of nicotine dependence: Secondary | ICD-10-CM | POA: Insufficient documentation

## 2012-09-27 DIAGNOSIS — Z860100 Personal history of colon polyps, unspecified: Secondary | ICD-10-CM | POA: Insufficient documentation

## 2012-09-27 DIAGNOSIS — Z8612 Personal history of poliomyelitis: Secondary | ICD-10-CM | POA: Insufficient documentation

## 2012-09-27 DIAGNOSIS — K5730 Diverticulosis of large intestine without perforation or abscess without bleeding: Secondary | ICD-10-CM | POA: Insufficient documentation

## 2012-09-27 DIAGNOSIS — I10 Essential (primary) hypertension: Secondary | ICD-10-CM | POA: Insufficient documentation

## 2012-09-27 DIAGNOSIS — I714 Abdominal aortic aneurysm, without rupture, unspecified: Secondary | ICD-10-CM | POA: Insufficient documentation

## 2012-09-27 DIAGNOSIS — Z7982 Long term (current) use of aspirin: Secondary | ICD-10-CM | POA: Insufficient documentation

## 2012-09-27 DIAGNOSIS — Z1211 Encounter for screening for malignant neoplasm of colon: Secondary | ICD-10-CM

## 2012-09-27 DIAGNOSIS — D126 Benign neoplasm of colon, unspecified: Secondary | ICD-10-CM

## 2012-09-27 DIAGNOSIS — E785 Hyperlipidemia, unspecified: Secondary | ICD-10-CM | POA: Insufficient documentation

## 2012-09-27 DIAGNOSIS — Z79899 Other long term (current) drug therapy: Secondary | ICD-10-CM | POA: Insufficient documentation

## 2012-09-27 DIAGNOSIS — I2510 Atherosclerotic heart disease of native coronary artery without angina pectoris: Secondary | ICD-10-CM | POA: Insufficient documentation

## 2012-09-27 DIAGNOSIS — G709 Myoneural disorder, unspecified: Secondary | ICD-10-CM | POA: Insufficient documentation

## 2012-09-27 HISTORY — PX: COLONOSCOPY: SHX5424

## 2012-09-27 LAB — GLUCOSE, CAPILLARY: Glucose-Capillary: 149 mg/dL — ABNORMAL HIGH (ref 70–99)

## 2012-09-27 SURGERY — COLONOSCOPY
Anesthesia: Moderate Sedation

## 2012-09-27 MED ORDER — MIDAZOLAM HCL 5 MG/5ML IJ SOLN
INTRAMUSCULAR | Status: DC | PRN
  Administered 2012-09-27: 1 mg via INTRAVENOUS
  Administered 2012-09-27: 2 mg via INTRAVENOUS
  Administered 2012-09-27 (×2): 1 mg via INTRAVENOUS

## 2012-09-27 MED ORDER — MIDAZOLAM HCL 5 MG/5ML IJ SOLN
INTRAMUSCULAR | Status: AC
  Filled 2012-09-27: qty 10

## 2012-09-27 MED ORDER — MEPERIDINE HCL 100 MG/ML IJ SOLN
INTRAMUSCULAR | Status: AC
  Filled 2012-09-27: qty 1

## 2012-09-27 MED ORDER — ONDANSETRON HCL 4 MG/2ML IJ SOLN
INTRAMUSCULAR | Status: DC | PRN
  Administered 2012-09-27: 4 mg via INTRAVENOUS

## 2012-09-27 MED ORDER — MEPERIDINE HCL 100 MG/ML IJ SOLN
INTRAMUSCULAR | Status: DC | PRN
  Administered 2012-09-27: 25 mg via INTRAVENOUS
  Administered 2012-09-27: 50 mg via INTRAVENOUS
  Administered 2012-09-27: 25 mg via INTRAVENOUS

## 2012-09-27 MED ORDER — SODIUM CHLORIDE 0.9 % IV SOLN
INTRAVENOUS | Status: DC
  Administered 2012-09-27: 09:00:00 via INTRAVENOUS

## 2012-09-27 MED ORDER — ONDANSETRON HCL 4 MG/2ML IJ SOLN
INTRAMUSCULAR | Status: AC
  Filled 2012-09-27: qty 2

## 2012-09-27 NOTE — Op Note (Signed)
[facility] [address] [place], [address]   COLONOSCOPY PROCEDURE REPORT  PATIENT: Juan [name], Juan [name]  MR#:         [id number] BIRTHDATE: [birth year] , 72  yrs. old GENDER: Male ENDOSCOPIST: R.  Roetta Sessions, MD FACP FACG REFERRED BY:  Lynnea Ferrier, M.D. PROCEDURE DATE:  09/27/2012 PROCEDURE:     Colonoscopy with multiple snare polypectomies  INDICATIONS:  History of colonic adenoma  INFORMED CONSENT:  The risks, benefits, alternatives and imponderables including but not limited to bleeding, perforation as well as the possibility of a missed lesion have been reviewed.  The potential for biopsy, lesion removal, etc. have also been discussed.  Questions have been answered.  All parties agreeable. Please see the history and physical in the medical record for more information.  MEDICATIONS: Versed 5 mg IV and Demerol 100 mg IV in divided doses. Zofran 4 mg IV. Cetacaine spray.  DESCRIPTION OF PROCEDURE:  After a digital rectal exam was performed, the [id number] ([id number])  colonoscope was advanced from the anus through the rectum and colon to the area of the cecum, ileocecal valve and appendiceal orifice.  The cecum was deeply intubated.  These structures were well-seen and photographed for the record.  From the level of the cecum and ileocecal valve, the scope was slowly and cautiously withdrawn.  The mucosal surfaces were carefully surveyed utilizing scope tip deflection to facilitate fold flattening as needed.  The scope was pulled down into the rectum where a thorough examination including retroflexion was performed.    FINDINGS:  Adequate preparation. Normal rectum. Scattered left-sided diverticula; (1) 3 mm polyp opposite the ileocecal valve and (1) 6 mm polyp in the mid ascending segment; otherwise, the remainder of the colonic mucosa appeared normal.  THERAPEUTIC / DIAGNOSTIC MANEUVERS PERFORMED:  The above-mentioned polyps were cold and hot snare  removed, respectively.  COMPLICATIONS: None  CECAL WITHDRAWAL TIME:  14 minutes  IMPRESSION:  Colonic polyps-removed as described above. Colonic diverticulosis  RECOMMENDATIONS: Followup on pathology. Further recommendations to follow   _______________________________ eSigned:  R. Roetta Sessions, MD FACP [facility] 09/27/2012 10:37 AM   CC:    PATIENT NAME:  Juan [name], Juan [name] MR#: [id number]

## 2012-09-27 NOTE — Interval H&P Note (Signed)
History and Physical Interval Note:  09/27/2012 9:52 AM  Juan [name]  has presented today for surgery, with the diagnosis of PERSONAL HISTORY OF COLON POLYPS  The various methods of treatment have been discussed with the patient and family. After consideration of risks, benefits and other options for treatment, the patient has consented to  Procedure(s) with comments: COLONOSCOPY (N/A) - 9:30 as a surgical intervention .  The patient's history has been reviewed, patient examined, no change in status, stable for surgery.  I have reviewed the patient's chart and labs.  Questions were answered to the patient's satisfaction.     No change. Colonoscopy per plan. The risks, benefits, limitations, alternatives and imponderables have been reviewed with the patient. Questions have been answered. All parties are agreeable.   Eula Listen

## 2012-09-27 NOTE — H&P (View-Only) (Signed)
Referring Provider: Donita Brooks, MD Primary Care Physician:  Leo Grosser, MD Primary Gastroenterologist:  Dr. Jena Gauss   Chief Complaint  Patient presents with  . Colonoscopy    HPI:   Mr. Juan [name] is a pleasant 72 year old male presenting at the request of Dr. Tanya Nones for surveillance colonoscopy. He was also noted to have a possible hemorrhoid on rectal exam recently.   Last colonoscopy in 2008 in [place], unsure who completed this. History of polyps in 2003. In 2008, 3 polyps. Denies rectal bleeding, no rectal discomfort. Notes rectal lump that has recurred; notes incidence of this in the past. No constipation or loose stools. No weight loss or lack of appetite. Walking more for exercise. Rare indigestion with spicy foods. No dysphagia.   Past Medical History  Diagnosis Date  . AAA (abdominal aortic aneurysm) 8/12    3.5cm  . CAD (coronary artery disease)   . Colon polyps   . Diabetes mellitus without complication   . Hyperlipidemia   . Hypertension   . Neuromuscular disorder     L2-3,L5-S1 bulging disc /nerve impingement  . 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 Tonsillectomy    . Coronary angioplasty      1989    Current Outpatient Prescriptions  Medication Sig Dispense Refill  . allopurinol (ZYLOPRIM) 300 MG tablet Take 300 mg by mouth daily.      Marland Kitchen aspirin 325 MG EC tablet Take 325 mg by mouth daily.      Marland Kitchen diltiazem (DILACOR XR) 240 MG 24 hr capsule Take 240 mg by mouth daily.      Marland Kitchen glipiZIDE (GLUCOTROL) 5 MG tablet Take 5 mg by mouth 2 (two) times daily before a meal.      . lisinopril (PRINIVIL,ZESTRIL) 40 MG tablet Take 40 mg by mouth daily.      . metFORMIN (GLUCOPHAGE) 1000 MG tablet Take 1,000 mg by mouth 2 (two) times daily with a meal.      . metoprolol succinate (TOPROL-XL) 25 MG 24 hr tablet Take 25 mg by mouth daily.      . simvastatin (ZOCOR) 20 MG tablet Take 20 mg by mouth  every evening.       No current facility-administered medications for this visit.    Allergies as of 09/09/2012  . (No Known Allergies)    Family History  Problem Relation Age of Onset  . Diabetes Mother   . Heart disease Mother   . Diabetes Father   . Heart disease Father   . Colon cancer Neg Hx   . Brain cancer Sister     History   Social History  . Marital Status: Married    Spouse Name: N/A    Number of Children: N/A  . Years of Education: N/A   Occupational History  . retired     Haematologist    Social History Main Topics  . Smoking status: Former Smoker -- 35 years    Types: Cigarettes    Quit date: 01/02/1988  . Smokeless tobacco: Never Used  . Alcohol Use: Yes     Comment: rare beer, no history of ETOH abuse  . Drug Use: No  . Sexually Active: Not on file   Other Topics Concern  . Not on file   Social History Narrative  . No narrative on file    Review of Systems: Gen: Denies any fever, chills, loss of appetite, fatigue, weight loss. CV:  Denies chest pain, heart palpitations, syncope, peripheral edema. Resp: Denies shortness of breath with rest, cough, wheezing GI: see HPI GU : Denies urinary burning, urinary frequency, urinary incontinence.  MS: occasional joint pain Derm: Denies rash, itching, dry skin Psych: Denies depression, anxiety, confusion or memory loss  Heme: Denies bruising, bleeding, and enlarged lymph nodes.  Physical Exam: BP 133/73  Pulse 80  Temp(Src) 97.3 F (36.3 C) (Oral)  Ht 5' 10.5" (1.791 m)  Wt 234 lb (106.142 kg)  BMI 33.09 kg/m2 General:   Alert and oriented. Well-developed, well-nourished, pleasant and cooperative. Head:  Normocephalic and atraumatic. Eyes:  Conjunctiva pink, sclera clear, no icterus.   Conjunctiva pink. Ears:  Normal auditory acuity. Nose:  No deformity, discharge,  or lesions. Mouth:  No deformity or lesions, mucosa pink and moist.  Neck:  Supple, without mass or thyromegaly. Lungs:  Clear to  auscultation bilaterally, without wheezing, rales, or rhonchi.  Heart:  S1, S2 present without murmurs noted.  Abdomen:  +BS, soft, non-tender and non-distended. Without mass or HSM. No rebound or guarding. No hernias noted. Rectal: internal exam with small, pea-sized "lump" around 8oclock just inside rectum, no pain or gross blood Msk:  Symmetrical without gross deformities. Normal posture. Extremities:  Without clubbing or edema. Neurologic:  Alert and  oriented x4;  grossly normal neurologically. Skin:  Intact, warm and dry without significant lesions or rashes Cervical Nodes:  No significant cervical adenopathy. Psych:  Alert and cooperative. Normal mood and affect.

## 2012-10-01 ENCOUNTER — Encounter (HOSPITAL_COMMUNITY): Payer: Self-pay | Admitting: Internal Medicine

## 2012-10-02 ENCOUNTER — Encounter: Payer: Self-pay | Admitting: Internal Medicine

## 2012-11-19 ENCOUNTER — Encounter: Payer: Medicare Other | Admitting: Gastroenterology

## 2012-12-24 ENCOUNTER — Ambulatory Visit (INDEPENDENT_AMBULATORY_CARE_PROVIDER_SITE_OTHER): Payer: Medicare Other | Admitting: *Deleted

## 2012-12-24 VITALS — Temp 98.1°F

## 2012-12-24 DIAGNOSIS — Z23 Encounter for immunization: Secondary | ICD-10-CM

## 2012-12-27 ENCOUNTER — Encounter: Payer: Self-pay | Admitting: Family

## 2012-12-30 ENCOUNTER — Ambulatory Visit (INDEPENDENT_AMBULATORY_CARE_PROVIDER_SITE_OTHER): Payer: Medicare Other | Admitting: Family

## 2012-12-30 ENCOUNTER — Encounter: Payer: Self-pay | Admitting: Family

## 2012-12-30 ENCOUNTER — Ambulatory Visit (HOSPITAL_COMMUNITY)
Admission: RE | Admit: 2012-12-30 | Discharge: 2012-12-30 | Disposition: A | Discharge status: Home / Self Care | Payer: Medicare Other | Source: Ambulatory Visit | Attending: Vascular Surgery | Admitting: Vascular Surgery

## 2012-12-30 VITALS — BP 151/78 | HR 70 | Resp 16 | Ht 70.0 in | Wt 232.0 lb

## 2012-12-30 DIAGNOSIS — I714 Abdominal aortic aneurysm, without rupture, unspecified: Secondary | ICD-10-CM

## 2012-12-30 NOTE — Patient Instructions (Signed)
Abdominal Aortic Aneurysm  An aneurysm is the enlargement (dilatation), bulging, or ballooning out of part of the wall of a vein or artery. An aortic aneurysm is a bulging in the largest artery of the body. This artery supplies blood from the heart to the rest of the body.  The first part of the aorta is called the thoracic aorta. It leaves the heart, rises (ascends), arches, and goes down (descends) through the chest until it reaches the diaphragm. The diaphragm is the muscular part between the chest and abdomen.  The second part of the aorta is called the abdominal aorta after it has passed the diaphragm and continues down through the abdomen. The abdominal aorta ends where it splits to form the two iliac arteries that go to the legs. Aortic aneurysms can develop anywhere along the length of the aorta. The majority are located along the abdominal aorta. The major concern with an aortic aneurysm is that it can enlarge and rupture. This can cause death unless diagnosed and treated promptly. Aneurysms can also develop blood clots or infections. CAUSES  Many aortic aneurysms are caused by arteriosclerosis. Arteriosclerosis can weaken the aortic wall. The pressure of the blood being pumped through the aorta causes it to balloon out at the site of weakness. Therefore, high blood pressure (hypertension) is associated with aneurysm. Other risk factors include:  Age over 60.  Tobacco use.  Being male.  White race.  Family history of aneurysm.  Less frequent causes of abdominal aortic aneurysms include:  Connective tissue diseases.  Abdominal trauma.  Inflammation of blood vessles (arteritis).  Inherited (congenital) malformations.  Infection. SYMPTOMS  The signs and symptoms of an unruptured aneurysm will partly depend on its size and rate of growth.   Abdominal aortic aneurysms may cause pain. The pain typically has a deep quality as if it is piercing into the person. It is felt most  often in the lower back area. The pain is usually steady but may be relieved by changing your body position.  The person may also become aware of an abnormally prominent pulse in the belly (abdominal pulsation). DIAGNOSIS  An aortic aneurysm may be discovered by chance on physical exam, or on X-ray studies done for other reasons. It may be suspected because of other problems such as back or abdominal pain. The following tests may help identify the problem.  X-rays of the abdomen can show calcium deposits in the aneurysm wall.  CT scanning of the abdomen, particularly with contrast medium, is accurate at showing the exact size and shape of the aneurysm.  Ultrasounds give a clear picture of the size of an aneurysm (about 98% accuracy).  MRI scanning is accurate, but often unnecessary.  An abdominal angiogram shows the source of the major blood vessels arising from the aorta. It reveals the size and extent of any aneurysm. It can also show a clot clinging to the wall of the aneurysm (mural thrombus). TREATMENT  Treating an abdominal aortic aneurysm depends on the size. A rupture of an aneurysm is uncommon when they are less than 5 cm wide (2 inches). Rupture is far more common in aneurysms that are over 6 cm wide (2.4 inches).  Surgical repair is usually recommended for all aneurysms over 6 cm wide (2.4 inches). This depends on the health, age, and other circumstances of the individual. This type of surgery consists of opening the abdomen, removing the aneurysm, and sewing a synthetic graft (similar to a cloth tube) in its place. A   less invasive form of this surgery, using stent grafts, is sometimes recommended.  For most patients, elective repair is recommended for aneurysms between 4 and 6 cm (1.6 and 2.4 inches). Elective means the surgery can be done at your convenience. This should not be put off too long if surgery is recommended.  If you smoke, stop immediately. Smoking is a major risk  factor for enlargement and rupture.  Medications may be used to help decrease complications  these include medicine to lower blood pressure and control cholesterol. HOME CARE INSTRUCTIONS   If you smoke, stop. Do not start smoking.  Take all medications as prescribed.  Your caregiver will tell you when to have your aneurysm rechecked, either by ultrasound or CT scan.  If your caregiver has given you a follow-up appointment, it is very important to keep that appointment. Not keeping the appointment could result in a chronic or permanent injury, pain, or disability. If there is any problem keeping the appointment, you must call back to this facility for assistance. SEEK MEDICAL CARE IF:   You develop mild abdominal pain or pressure.  You are able to feel or perceive your aneurysm, and you sense any change. SEEK IMMEDIATE MEDICAL CARE IF:   You develop severe abdominal pain, or severe pain moving (radiating) to your back.  You suddenly develop cold or blue toes or feet.  You suddenly develop lightheadedness or fainting spells. MAKE SURE YOU:   Understand these instructions.  Will watch your condition.  Will get help right away if you are not doing well or get worse. Document Released: 12/07/2004 Document Revised: 05/22/2011 Document Reviewed: 10/01/2007 [employer] Patient Information 2014 [employer].  

## 2012-12-30 NOTE — Progress Notes (Signed)
[facility]   Established Abdominal Aortic Aneurysm  History of Present Illness  Juan [name] is a 72 y.o. (30-Jul-1940) male patient initially referred to Dr. Hart Rochester for evaluation regarding abdominal aortic aneurysm. Patient had an MR scan in 2012 for back problems and was found to have a 3.5 cm infrarenal abdominal aortic aneurysm. He had injections for his back problem and that his resolved. In September, 2013 he had ultrasound which revealed enlargement of the aneurysm to 3.7 x 4.1 cm in maximum diameter.  The patient does not have back or abdominal pain.  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 The patient states rare tightness in calves after walking over an hour. The patient denies history of stroke or TIA symptoms. He had balloon angioplasty of a coronary artery in 1989 and stopped smoking then; patient denies history of MI. Patient states he is working with his PCP on getting his blood pressure under better control.   Pt Diabetic: Yes, states under control Pt smoker: former smoker, quit at age 79  Past Medical History  Diagnosis Date  . AAA (abdominal aortic aneurysm) 8/12    3.5cm  . CAD (coronary artery disease)   . Colon polyps   . Diabetes mellitus without complication   . Hyperlipidemia   . Hypertension   . Neuromuscular disorder     L2-3,L5-S1 bulging disc /nerve impingement  . 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 Tonsillectomy    . Coronary angioplasty      1989  . Colonoscopy N/A 09/27/2012    Procedure: COLONOSCOPY;  Surgeon: Corbin Ade, MD;  Location: [facility];  Service: Endoscopy;  Laterality: N/A;  9:30   Social History History   Social History  . Marital Status: Married    Spouse Name: N/A    Number of Children: N/A  . Years of Education: N/A   Occupational History  . retired     Haematologist    Social  History Main Topics  . Smoking status: Former Smoker -- 35 years    Types: Cigarettes    Quit date: 01/02/1988  . Smokeless tobacco: Never Used  . Alcohol Use: Yes     Comment: rare beer, no history of ETOH abuse  . Drug Use: No  . Sexual Activity: Not on file   Other Topics Concern  . Not on file   Social History Narrative  . No narrative on file   Family History Family History  Problem Relation Age of Onset  . Diabetes Mother   . Heart disease Mother   . Diabetes Father   . Heart disease Father   . Colon cancer Neg Hx   . Brain cancer Sister     Current Outpatient Prescriptions on File Prior to Visit  Medication Sig Dispense Refill  . allopurinol (ZYLOPRIM) 300 MG tablet Take 300 mg by mouth daily.      Marland Kitchen aspirin 325 MG EC tablet Take 325 mg by mouth daily.      Marland Kitchen diltiazem (DILACOR XR) 240 MG 24 hr capsule Take 240 mg by mouth daily.      Marland Kitchen glipiZIDE (GLUCOTROL) 5 MG tablet Take 5 mg by mouth daily.       . hydrochlorothiazide (MICROZIDE) 12.5 MG capsule Take 1 capsule (12.5 mg total) by mouth daily.  30 capsule  5  . hydrocortisone (ANUSOL-HC) 25 MG suppository Place 1 suppository (  25 mg total) rectally every 12 (twelve) hours.  12 suppository  1  . lisinopril (PRINIVIL,ZESTRIL) 40 MG tablet Take 40 mg by mouth daily.      . metoprolol succinate (TOPROL-XL) 25 MG 24 hr tablet Take 25 mg by mouth daily.      . Multiple Vitamin (MULTIVITAMIN WITH MINERALS) TABS Take 1 tablet by mouth daily.      . polyethylene glycol-electrolytes (TRILYTE) 420 G solution Take 4,000 mLs by mouth as directed.  4000 mL  0  . simvastatin (ZOCOR) 20 MG tablet Take 20 mg by mouth every evening.       No current facility-administered medications on file prior to visit.   No Known Allergies  ROS: [x]  Positive   [ ]  Negative   [ ]  All sytems reviewed and are negative  General: [ ]  Weight loss, [ ]  Fever, [ ]  chills Neurologic: [ ]  Dizziness, [ ]  Blackouts, [ ]  Seizure [ ]  Stroke, [ ]  "Mini  stroke", [ ]  Slurred speech, [ ]  Temporary blindness; [ ]  weakness in arms or legs, [ ]  Hoarseness Cardiac: [ ]  Chest pain/pressure, [ ]  Shortness of breath at rest [ ]  Shortness of breath with exertion, [ ]  Atrial fibrillation or irregular heartbeat Vascular: [ ]  Pain in legs with walking, [ ]  Pain in legs at rest, [ ]  Pain in legs at night,  [ ]  Non-healing ulcer, [ ]  Blood clot in vein/DVT,   Pulmonary: [ ]  Home oxygen, [ ]  Productive cough, [ ]  Coughing up blood, [ ]  Asthma,  [ ]  Wheezing Musculoskeletal:  [ ]  Arthritis, [ ]  Low back pain, [ ]  Joint pain Hematologic: [ ]  Easy Bruising, [ ]  Anemia; [ ]  Hepatitis Gastrointestinal: [ ]  Blood in stool, [ ]  Gastroesophageal Reflux/heartburn, [ ]  Trouble swallowing Urinary: [ ]  chronic Kidney disease, [ ]  on HD - [ ]  MWF or [ ]  TTHS, [ ]  Burning with urination, [ ]  Difficulty urinating Skin: [ ]  Rashes, [ ]  Wounds Psychological: [ ]  Anxiety, [ ]  Depression  Physical Examination Filed Vitals:   12/30/12 0915  BP: 151/78  Pulse: 70  Resp: 16   Filed Weights   12/30/12 0915  Weight: 232 lb (105.235 kg)   Body mass index is 33.29 kg/(m^2).  General: A&O x 3, WD, Obese.  Pulmonary: Sym exp, good air movt, CTAB, no rales, rhonchi, or wheezing.   Cardiac: RRR, Nl S1, S2, no Murmurs, rubs or gallops.  Carotid Bruits Left Right   Negative Negative   Aorta is not palpable Radial pulses are 3+ palpable and equal.                          VASCULAR EXAM:                                                                                                         LE Pulses LEFT RIGHT       POPLITEAL  not palpable   not palpable  POSTERIOR TIBIAL   palpable    palpable        DORSALIS PEDIS      ANTERIOR TIBIAL not palpable   palpable      Gastrointestinal: soft, NTND, -G/R, - HSM, - masses, - CVAT B.  Musculoskeletal: M/S 5/5 throughout, Extremities without ischemic changes.   Neurologic: CN 2-12 intact , Pain and light touch  intact in extremities, Motor exam as listed above.  Non-Invasive Vascular Imaging  AAA Duplex (12/30/2012)  Previous size: 4.1 cm (Date: 01/02/12)  Current size:  3.36 x 3.25 cm (Date: 12/30/2012)  Medical Decision Making  The patient is a 72 y.o. male who presents with asymptomatic AAA with no increase in size.   Based on this patient's exam and diagnostic studies, the patient will follow up in 1 year  with the following studies: AAA Duplex.  The threshold for repair is AAA size > 5.5 cm, growth > 1 cm/yr, and symptomatic status.  I emphasized the importance of maximal medical management including strict control of blood pressure, blood glucose, and lipid levels, antiplatelet agents, obtaining regular exercise, and cessation of smoking.   The patient was given information about AAA including signs, symptoms, treatment, and how to minimize the risk of enlargement and rupture of aneurysms.    The patient was advised to call [phone] should the patient experience sudden onset abdominal or back pain.   Thank you for allowing [place] to participate in this patient's care.  Charisse March, RN, MSN, FNP-C [facility] [place] Office: [phone]  Clinic Physician: Hart Rochester  12/30/2012, 9:00 AM

## 2012-12-31 ENCOUNTER — Ambulatory Visit: Payer: Medicare Other | Admitting: Neurosurgery

## 2012-12-31 NOTE — Addendum Note (Signed)
Addended by: Sharee Pimple on: 12/31/2012 07:50 AM   Modules accepted: Orders

## 2013-02-13 ENCOUNTER — Other Ambulatory Visit: Payer: Medicare Other

## 2013-02-13 DIAGNOSIS — I10 Essential (primary) hypertension: Secondary | ICD-10-CM

## 2013-02-13 DIAGNOSIS — E119 Type 2 diabetes mellitus without complications: Secondary | ICD-10-CM

## 2013-02-13 DIAGNOSIS — Z79899 Other long term (current) drug therapy: Secondary | ICD-10-CM

## 2013-02-13 DIAGNOSIS — E785 Hyperlipidemia, unspecified: Secondary | ICD-10-CM

## 2013-02-13 LAB — COMPREHENSIVE METABOLIC PANEL
AST: 26 U/L (ref 0–37)
Alkaline Phosphatase: 49 U/L (ref 39–117)
BUN: 22 mg/dL (ref 6–23)
Glucose, Bld: 172 mg/dL — ABNORMAL HIGH (ref 70–99)
Potassium: 4.2 mEq/L (ref 3.5–5.3)
Sodium: 140 mEq/L (ref 135–145)
Total Bilirubin: 0.5 mg/dL (ref 0.3–1.2)

## 2013-02-13 LAB — CBC WITH DIFFERENTIAL/PLATELET
Basophils Absolute: 0 10*3/uL (ref 0.0–0.1)
Basophils Relative: 0 % (ref 0–1)
Eosinophils Absolute: 0.2 10*3/uL (ref 0.0–0.7)
Hemoglobin: 12.6 g/dL — ABNORMAL LOW (ref 13.0–17.0)
MCH: 31 pg (ref 26.0–34.0)
MCHC: 35 g/dL (ref 30.0–36.0)
Monocytes Absolute: 0.4 10*3/uL (ref 0.1–1.0)
Monocytes Relative: 8 % (ref 3–12)
Neutro Abs: 2.7 10*3/uL (ref 1.7–7.7)
Neutrophils Relative %: 57 % (ref 43–77)
RDW: 14.5 % (ref 11.5–15.5)

## 2013-02-13 LAB — HEMOGLOBIN A1C: Hgb A1c MFr Bld: 7.5 % — ABNORMAL HIGH (ref <5.7)

## 2013-02-13 LAB — LIPID PANEL
HDL: 33 mg/dL — ABNORMAL LOW (ref >39)
LDL Cholesterol: 80 mg/dL (ref 0–99)
Triglycerides: 228 mg/dL — ABNORMAL HIGH (ref <150)
VLDL: 46 mg/dL — ABNORMAL HIGH (ref 0–40)

## 2013-02-18 ENCOUNTER — Ambulatory Visit (INDEPENDENT_AMBULATORY_CARE_PROVIDER_SITE_OTHER): Payer: Medicare Other | Admitting: Family Medicine

## 2013-02-18 ENCOUNTER — Encounter: Payer: Self-pay | Admitting: Family Medicine

## 2013-02-18 VITALS — BP 142/70 | HR 68 | Temp 97.1°F | Resp 16 | Ht 70.5 in | Wt 236.0 lb

## 2013-02-18 DIAGNOSIS — I10 Essential (primary) hypertension: Secondary | ICD-10-CM

## 2013-02-18 DIAGNOSIS — E119 Type 2 diabetes mellitus without complications: Secondary | ICD-10-CM

## 2013-02-18 DIAGNOSIS — Z23 Encounter for immunization: Secondary | ICD-10-CM

## 2013-02-18 DIAGNOSIS — E785 Hyperlipidemia, unspecified: Secondary | ICD-10-CM

## 2013-02-18 NOTE — Progress Notes (Signed)
Subjective:    Patient ID: Juan [name], male    DOB: 1940-03-30, 72 y.o.   MRN: [id number]  HPI  Patient is here for followup of his diabetes, hypertension, and hyperlipidemia. Discussed taking metformin 500 mg by mouth twice a day due to diarrhea. He is also on glipizide 5 mg by mouth daily. Since decreasing his metformin his hemoglobin A1c has risen from 6.4-7.5. He is asymptomatic. He otherwise is feeling well. He continues to take an aspirin 325 mg by mouth daily due to his history of coronary artery disease. His most recent labwork as listed below. His LDL is at goal. His creatinine has risen slightly to 1.4. Lab on 02/13/2013  Component Date Value Range Status  . WBC 02/13/2013 4.8  4.0 - 10.5 K/uL Final  . RBC 02/13/2013 4.07* 4.22 - 5.81 MIL/uL Final  . Hemoglobin 02/13/2013 12.6* 13.0 - 17.0 g/dL Final  . HCT 82/95/6213 36.0* 39.0 - 52.0 % Final  . MCV 02/13/2013 88.5  78.0 - 100.0 fL Final  . MCH 02/13/2013 31.0  26.0 - 34.0 pg Final  . MCHC 02/13/2013 35.0  30.0 - 36.0 g/dL Final  . RDW 08/65/7846 14.5  11.5 - 15.5 % Final  . Platelets 02/13/2013 136* 150 - 400 K/uL Final  . Neutrophils Relative % 02/13/2013 57  43 - 77 % Final  . Neutro Abs 02/13/2013 2.7  1.7 - 7.7 K/uL Final  . Lymphocytes Relative 02/13/2013 32  12 - 46 % Final  . Lymphs Abs 02/13/2013 1.5  0.7 - 4.0 K/uL Final  . Monocytes Relative 02/13/2013 8  3 - 12 % Final  . Monocytes Absolute 02/13/2013 0.4  0.1 - 1.0 K/uL Final  . Eosinophils Relative 02/13/2013 3  0 - 5 % Final  . Eosinophils Absolute 02/13/2013 0.2  0.0 - 0.7 K/uL Final  . Basophils Relative 02/13/2013 0  0 - 1 % Final  . Basophils Absolute 02/13/2013 0.0  0.0 - 0.1 K/uL Final  . Smear Review 02/13/2013 Criteria for review not met   Final  . Sodium 02/13/2013 140  135 - 145 mEq/L Final  . Potassium 02/13/2013 4.2  3.5 - 5.3 mEq/L Final  . Chloride 02/13/2013 104  96 - 112 mEq/L Final  . CO2 02/13/2013 25  19 - 32 mEq/L Final  .  Glucose, Bld 02/13/2013 172* 70 - 99 mg/dL Final  . BUN 96/29/5284 22  6 - 23 mg/dL Final  . Creat 13/24/4010 1.43* 0.50 - 1.35 mg/dL Final  . Total Bilirubin 02/13/2013 0.5  0.3 - 1.2 mg/dL Final  . Alkaline Phosphatase 02/13/2013 49  39 - 117 U/L Final  . AST 02/13/2013 26  0 - 37 U/L Final  . ALT 02/13/2013 26  0 - 53 U/L Final  . Total Protein 02/13/2013 6.5  6.0 - 8.3 g/dL Final  . Albumin 27/25/3664 3.9  3.5 - 5.2 g/dL Final  . Calcium 40/34/7425 9.1  8.4 - 10.5 mg/dL Final  . Cholesterol 95/63/8756 159  0 - 200 mg/dL Final   Comment: ATP III Classification:                                < 200        mg/dL        Desirable  200 - 239     mg/dL        Borderline High                               >= 240        mg/dL        High                             . Triglycerides 02/13/2013 228* <150 mg/dL Final  . HDL 81/19/1478 33* >39 mg/dL Final  . Total CHOL/HDL Ratio 02/13/2013 4.8   Final  . VLDL 02/13/2013 46* 0 - 40 mg/dL Final  . LDL Cholesterol 02/13/2013 80  0 - 99 mg/dL Final   Comment:                            Total Cholesterol/HDL Ratio:CHD Risk                                                 Coronary Heart Disease Risk Table                                                                 Men       Women                                   1/2 Average Risk              3.4        3.3                                       Average Risk              5.0        4.4                                    2X Average Risk              9.6        7.1                                    3X Average Risk             23.4       11.0                          Use the calculated Patient Ratio above and the CHD Risk table  to determine the patient's CHD Risk.                          ATP III Classification (LDL):                                < 100        mg/dL         Optimal                               100 - 129     mg/dL         Near  or Above Optimal                               130 - 159     mg/dL         Borderline High                               160 - 189     mg/dL         High                                > 190        mg/dL         Very High                             . Hemoglobin A1C 02/13/2013 7.5* <5.7 % Final   Comment:                                                                                                 According to the ADA Clinical Practice Recommendations for 2011, when                          HbA1c is used as a screening test:                                                       >=6.5%   Diagnostic of Diabetes Mellitus                                     (if abnormal result is confirmed)  5.7-6.4%   Increased risk of developing Diabetes Mellitus                                                     References:Diagnosis and Classification of Diabetes Mellitus,Diabetes                          Care,2011,34(Suppl 1):S62-S69 and Standards of Medical Care in                                  Diabetes - 2011,Diabetes Care,2011,34 (Suppl 1):S11-S61.                             . Mean Plasma Glucose 02/13/2013 169* <117 mg/dL Final   Past Medical History  Diagnosis Date  . AAA (abdominal aortic aneurysm) 8/12    3.5cm  . CAD (coronary artery disease)   . Colon polyps   . Diabetes mellitus without complication   . Hyperlipidemia   . Hypertension   . Neuromuscular disorder     L2-3,L5-S1 bulging disc /nerve impingement  . 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 allopurinol (ZYLOPRIM) 300 MG tablet Take 300 mg by mouth daily.      Marland Kitchen aspirin 325 MG EC tablet Take 325 mg by mouth daily.      Marland Kitchen CARTIA XT 240 MG 24 hr capsule Take 240 mg by mouth daily.       Marland Kitchen diltiazem (DILACOR XR) 240 MG 24 hr capsule Take 240 mg by mouth daily.      Marland Kitchen  glipiZIDE (GLUCOTROL) 5 MG tablet Take 5 mg by mouth 2 (two) times daily before a meal.       . lisinopril (PRINIVIL,ZESTRIL) 40 MG tablet Take 40 mg by mouth daily.      . metFORMIN (GLUCOPHAGE) 500 MG tablet Take 500 mg by mouth 2 (two) times daily with a meal.      . metoprolol succinate (TOPROL-XL) 25 MG 24 hr tablet Take 25 mg by mouth daily.      . Multiple Vitamin (MULTIVITAMIN WITH MINERALS) TABS Take 1 tablet by mouth daily.      . simvastatin (ZOCOR) 20 MG tablet Take 20 mg by mouth every evening.       No current facility-administered medications on file prior to visit.   No Known Allergies Past Surgical History  Procedure Laterality Date  . Tonsillectomy    . Coronary angioplasty      1989  . Colonoscopy N/A 09/27/2012    Procedure: COLONOSCOPY;  Surgeon: Corbin Ade, MD;  Location: [facility];  Service: Endoscopy;  Laterality: N/A;  9:30   History   Social History  . Marital Status: Married    Spouse Name: N/A    Number of Children: N/A  . Years of Education: N/A   Occupational History  . retired     Haematologist    Social History Main Topics  . Smoking status: Former Smoker -- 35 years    Types: Cigarettes    Quit date: 01/02/1988  . Smokeless tobacco: Never Used  . Alcohol  Use: Yes     Comment: rare beer, no history of ETOH abuse  . Drug Use: No  . Sexual Activity: Not on file   Other Topics Concern  . Not on file   Social History Narrative  . No narrative on file     Review of Systems  All other systems reviewed and are negative.       Objective:   Physical Exam  Vitals reviewed. Neck: Neck supple. No JVD present. No thyromegaly present.  Cardiovascular: Normal rate, regular rhythm and normal heart sounds.  Exam reveals no gallop and no friction rub.   No murmur heard. Pulmonary/Chest: Effort normal and breath sounds normal. No respiratory distress. He has no wheezes. He has no rales. He exhibits no tenderness.  Abdominal: Soft. Bowel  sounds are normal. He exhibits no distension. There is no tenderness. There is no rebound and no guarding.  Musculoskeletal: He exhibits no edema.  Lymphadenopathy:    He has no cervical adenopathy.          Assessment & Plan:  1. Type II or unspecified type diabetes mellitus without mention of complication, not stated as uncontrolled Increase glipizide to 5 mg by mouth twice a day and recheck hemoglobin A1c in 3 months. Patient received Prevnar 13 today in clinic.  2. Essential hypertension, benign Discontinue hydrochlorothiazide due to the slight rise in creatinine. Recheck creatinine in 3 months. Replace hydrochlorothiazide with Cardura 4 mg by mouth daily and recheck blood pressure in one month.  3. Other and unspecified hyperlipidemia LDL is acceptable. Continue simvastatin 20 mg by mouth daily.

## 2013-02-18 NOTE — Addendum Note (Signed)
Addended by: Legrand Rams B on: 02/18/2013 02:51 PM   Modules accepted: Orders

## 2013-03-18 ENCOUNTER — Telehealth: Payer: Self-pay | Admitting: Family Medicine

## 2013-03-18 MED ORDER — SIMVASTATIN 20 MG PO TABS: 20.0000 mg | ORAL_TABLET | Freq: Every evening | ORAL | Status: DC

## 2013-03-18 MED ORDER — ALLOPURINOL 300 MG PO TABS: 300.0000 mg | ORAL_TABLET | Freq: Every day | ORAL | Status: DC

## 2013-03-18 MED ORDER — METOPROLOL SUCCINATE ER 25 MG PO TB24: 25.0000 mg | ORAL_TABLET | Freq: Every day | ORAL | Status: DC

## 2013-03-18 MED ORDER — LISINOPRIL 40 MG PO TABS: 40.0000 mg | ORAL_TABLET | Freq: Every day | ORAL | Status: DC

## 2013-03-18 MED ORDER — METFORMIN HCL 500 MG PO TABS
500.0000 mg | ORAL_TABLET | Freq: Two times a day (BID) | ORAL | Status: DC
Start: 2013-03-18 — End: 2013-08-04

## 2013-03-18 MED ORDER — DILTIAZEM HCL ER 240 MG PO CP24: 240.0000 mg | ORAL_CAPSULE | Freq: Every day | ORAL | Status: DC

## 2013-03-18 MED ORDER — GLIPIZIDE 5 MG PO TABS: 5.0000 mg | ORAL_TABLET | Freq: Two times a day (BID) | ORAL | Status: DC

## 2013-03-18 MED ORDER — DOXAZOSIN MESYLATE 4 MG PO TABS
4.0000 mg | ORAL_TABLET | Freq: Every day | ORAL | Status: DC
Start: 2013-03-18 — End: 2013-03-25

## 2013-03-18 NOTE — Telephone Encounter (Signed)
All medications sent to mail order for 90 day supply.  Duplicate medication Cartia XT 240mg  discontinued per provider.

## 2013-03-18 NOTE — Telephone Encounter (Signed)
Please call regarding sending Rx's to [facility]

## 2013-03-20 ENCOUNTER — Other Ambulatory Visit: Payer: Self-pay | Admitting: Family Medicine

## 2013-03-20 MED ORDER — SIMVASTATIN 20 MG PO TABS: 20.0000 mg | ORAL_TABLET | Freq: Every evening | ORAL | Status: DC

## 2013-03-20 NOTE — Telephone Encounter (Signed)
Rx Refilled  

## 2013-03-25 ENCOUNTER — Other Ambulatory Visit: Payer: Self-pay | Admitting: Family Medicine

## 2013-03-25 MED ORDER — DILTIAZEM HCL ER 240 MG PO CP24: 240.0000 mg | ORAL_CAPSULE | Freq: Every day | ORAL | Status: DC

## 2013-03-25 MED ORDER — ALLOPURINOL 300 MG PO TABS: 300.0000 mg | ORAL_TABLET | Freq: Every day | ORAL | Status: DC

## 2013-03-25 MED ORDER — GLIPIZIDE 5 MG PO TABS: 5.0000 mg | ORAL_TABLET | Freq: Two times a day (BID) | ORAL | Status: DC

## 2013-03-25 MED ORDER — LISINOPRIL 40 MG PO TABS: 40.0000 mg | ORAL_TABLET | Freq: Every day | ORAL | Status: DC

## 2013-03-25 MED ORDER — DOXAZOSIN MESYLATE 4 MG PO TABS: 4.0000 mg | ORAL_TABLET | Freq: Every day | ORAL | Status: DC

## 2013-03-25 MED ORDER — SIMVASTATIN 20 MG PO TABS: 20.0000 mg | ORAL_TABLET | Freq: Every evening | ORAL | Status: DC

## 2013-03-26 ENCOUNTER — Encounter: Payer: Self-pay | Admitting: Family Medicine

## 2013-03-26 NOTE — Telephone Encounter (Signed)
This encounter was created in error - please disregard.

## 2013-03-27 ENCOUNTER — Other Ambulatory Visit: Payer: Self-pay | Admitting: Family Medicine

## 2013-03-27 MED ORDER — ATORVASTATIN CALCIUM 20 MG PO TABS: 20.0000 mg | ORAL_TABLET | Freq: Every day | ORAL | Status: DC

## 2013-03-27 MED ORDER — DILTIAZEM HCL ER COATED BEADS 240 MG PO CP24: 240.0000 mg | ORAL_CAPSULE | Freq: Every day | ORAL | Status: DC

## 2013-03-27 NOTE — Telephone Encounter (Signed)
Sent in new rx for Diltiazem CD instead of XR

## 2013-06-04 ENCOUNTER — Other Ambulatory Visit: Payer: Commercial Managed Care - HMO

## 2013-06-04 DIAGNOSIS — IMO0001 Reserved for inherently not codable concepts without codable children: Secondary | ICD-10-CM

## 2013-06-04 DIAGNOSIS — I10 Essential (primary) hypertension: Secondary | ICD-10-CM

## 2013-06-04 DIAGNOSIS — E1165 Type 2 diabetes mellitus with hyperglycemia: Principal | ICD-10-CM

## 2013-06-04 DIAGNOSIS — E785 Hyperlipidemia, unspecified: Secondary | ICD-10-CM

## 2013-06-04 DIAGNOSIS — Z79899 Other long term (current) drug therapy: Secondary | ICD-10-CM

## 2013-06-04 LAB — CBC WITH DIFFERENTIAL/PLATELET
BASOS ABS: 0 10*3/uL (ref 0.0–0.1)
Basophils Relative: 0 % (ref 0–1)
Eosinophils Absolute: 0.1 10*3/uL (ref 0.0–0.7)
Eosinophils Relative: 3 % (ref 0–5)
HCT: 37.5 % — ABNORMAL LOW (ref 39.0–52.0)
Hemoglobin: 12.6 g/dL — ABNORMAL LOW (ref 13.0–17.0)
LYMPHS PCT: 35 % (ref 12–46)
Lymphs Abs: 1.7 10*3/uL (ref 0.7–4.0)
MCH: 29.8 pg (ref 26.0–34.0)
MCHC: 33.6 g/dL (ref 30.0–36.0)
MCV: 88.7 fL (ref 78.0–100.0)
Monocytes Absolute: 0.4 10*3/uL (ref 0.1–1.0)
Monocytes Relative: 8 % (ref 3–12)
NEUTROS ABS: 2.6 10*3/uL (ref 1.7–7.7)
Neutrophils Relative %: 54 % (ref 43–77)
PLATELETS: 139 10*3/uL — AB (ref 150–400)
RBC: 4.23 MIL/uL (ref 4.22–5.81)
RDW: 14.5 % (ref 11.5–15.5)
WBC: 4.8 10*3/uL (ref 4.0–10.5)

## 2013-06-04 LAB — COMPLETE METABOLIC PANEL WITH GFR
ALBUMIN: 4.1 g/dL (ref 3.5–5.2)
ALT: 18 U/L (ref 0–53)
AST: 15 U/L (ref 0–37)
Alkaline Phosphatase: 61 U/L (ref 39–117)
BUN: 15 mg/dL (ref 6–23)
CALCIUM: 9.1 mg/dL (ref 8.4–10.5)
CHLORIDE: 105 meq/L (ref 96–112)
CO2: 27 mEq/L (ref 19–32)
Creat: 1.25 mg/dL (ref 0.50–1.35)
GFR, Est African American: 65 mL/min
GFR, Est Non African American: 56 mL/min — ABNORMAL LOW
Glucose, Bld: 106 mg/dL — ABNORMAL HIGH (ref 70–99)
Potassium: 4.2 mEq/L (ref 3.5–5.3)
Sodium: 140 mEq/L (ref 135–145)
Total Bilirubin: 0.5 mg/dL (ref 0.2–1.2)
Total Protein: 6.2 g/dL (ref 6.0–8.3)

## 2013-06-04 LAB — LIPID PANEL
CHOLESTEROL: 133 mg/dL (ref 0–200)
HDL: 37 mg/dL — ABNORMAL LOW (ref >39)
LDL Cholesterol: 73 mg/dL (ref 0–99)
Total CHOL/HDL Ratio: 3.6 Ratio
Triglycerides: 116 mg/dL (ref <150)
VLDL: 23 mg/dL (ref 0–40)

## 2013-06-04 LAB — HEMOGLOBIN A1C
Hgb A1c MFr Bld: 6.6 % — ABNORMAL HIGH (ref <5.7)
Mean Plasma Glucose: 143 mg/dL — ABNORMAL HIGH (ref <117)

## 2013-06-09 ENCOUNTER — Ambulatory Visit (INDEPENDENT_AMBULATORY_CARE_PROVIDER_SITE_OTHER): Payer: Commercial Managed Care - HMO | Admitting: Family Medicine

## 2013-06-09 ENCOUNTER — Encounter: Payer: Self-pay | Admitting: Family Medicine

## 2013-06-09 VITALS — BP 146/60 | HR 80 | Temp 97.7°F | Resp 18 | Ht 70.5 in | Wt 230.0 lb

## 2013-06-09 DIAGNOSIS — E785 Hyperlipidemia, unspecified: Secondary | ICD-10-CM

## 2013-06-09 DIAGNOSIS — E119 Type 2 diabetes mellitus without complications: Secondary | ICD-10-CM

## 2013-06-09 DIAGNOSIS — I10 Essential (primary) hypertension: Secondary | ICD-10-CM

## 2013-06-09 NOTE — Progress Notes (Signed)
Subjective:    Patient ID: Juan [name], male    DOB: 13-Mar-1941, 73 y.o.   MRN: [id number]  HPI  Patient is here today for followup of his diabetes, hypertension, hyperlipidemia. Regards to his hypertension, he is currently taking diltiazem CD 240 mg by mouth daily, Cardura 4 mg by mouth daily, lisinopril 40 mg by mouth daily, and Toprol-XL.  He denies any chest pain, shortness of breath, dyspnea on exertion. Unfortunately his blood pressure is elevated especially given his history of a AAA.Marland Kitchen  Regards to his diabetes, he is still taking metformin 500 mg by mouth twice a day and glipizide 5 mg by mouth twice a day. He is 6 pounds since his last office visit. He is exercising and trying to watch his diet. The hemoglobin A1c has dropped from 7.5 6.6. He is also taking Lipitor 20 mg by mouth daily. He denies any myalgia or right upper quadrant pain. His recent labwork as listed below: Lab on 06/04/2013  Component Date Value Ref Range Status  . WBC 06/04/2013 4.8  4.0 - 10.5 K/uL Final  . RBC 06/04/2013 4.23  4.22 - 5.81 MIL/uL Final  . Hemoglobin 06/04/2013 12.6* 13.0 - 17.0 g/dL Final  . HCT 06/04/2013 37.5* 39.0 - 52.0 % Final  . MCV 06/04/2013 88.7  78.0 - 100.0 fL Final  . MCH 06/04/2013 29.8  26.0 - 34.0 pg Final  . MCHC 06/04/2013 33.6  30.0 - 36.0 g/dL Final  . RDW 06/04/2013 14.5  11.5 - 15.5 % Final  . Platelets 06/04/2013 139* 150 - 400 K/uL Final  . Neutrophils Relative % 06/04/2013 54  43 - 77 % Final  . Neutro Abs 06/04/2013 2.6  1.7 - 7.7 K/uL Final  . Lymphocytes Relative 06/04/2013 35  12 - 46 % Final  . Lymphs Abs 06/04/2013 1.7  0.7 - 4.0 K/uL Final  . Monocytes Relative 06/04/2013 8  3 - 12 % Final  . Monocytes Absolute 06/04/2013 0.4  0.1 - 1.0 K/uL Final  . Eosinophils Relative 06/04/2013 3  0 - 5 % Final  . Eosinophils Absolute 06/04/2013 0.1  0.0 - 0.7 K/uL Final  . Basophils Relative 06/04/2013 0  0 - 1 % Final  . Basophils Absolute 06/04/2013 0.0  0.0 - 0.1 K/uL  Final  . Smear Review 06/04/2013 Criteria for review not met   Final  . Hemoglobin A1C 06/04/2013 6.6* <5.7 % Final   Comment:                                                                                                 According to the ADA Clinical Practice Recommendations for 2011, when                          HbA1c is used as a screening test:                                                       >=  6.5%   Diagnostic of Diabetes Mellitus                                     (if abnormal result is confirmed)                                                     5.7-6.4%   Increased risk of developing Diabetes Mellitus                                                     References:Diagnosis and Classification of Diabetes Mellitus,Diabetes                          EXBM,8413,24(MWNUU 1):S62-S69 and Standards of Medical Care in                                  Diabetes - 2011,Diabetes Care,2011,34 (Suppl 1):S11-S61.                             . Mean Plasma Glucose 06/04/2013 143* <117 mg/dL Final  . Cholesterol 06/04/2013 133  0 - 200 mg/dL Final   Comment: ATP III Classification:                                < 200        mg/dL        Desirable                               200 - 239     mg/dL        Borderline High                               >= 240        mg/dL        High                             . Triglycerides 06/04/2013 116  <150 mg/dL Final  . HDL 06/04/2013 37* >39 mg/dL Final  . Total CHOL/HDL Ratio 06/04/2013 3.6   Final  . VLDL 06/04/2013 23  0 - 40 mg/dL Final  . LDL Cholesterol 06/04/2013 73  0 - 99 mg/dL Final   Comment:                            Total Cholesterol/HDL Ratio:CHD Risk                                                 Coronary Heart Disease Risk  Table                                                                 Men       Women                                   1/2 Average Risk              3.4        3.3                                        Average Risk              5.0        4.4                                    2X Average Risk              9.6        7.1                                    3X Average Risk             23.4       11.0                          Use the calculated Patient Ratio above and the CHD Risk table                           to determine the patient's CHD Risk.                          ATP III Classification (LDL):                                < 100        mg/dL         Optimal                               100 - 129     mg/dL         Near or Above Optimal                               130 - 159     mg/dL         Borderline High                               160 - 189     mg/dL  High                                > 190        mg/dL         Very High                             . Sodium 06/04/2013 140  135 - 145 mEq/L Final  . Potassium 06/04/2013 4.2  3.5 - 5.3 mEq/L Final  . Chloride 06/04/2013 105  96 - 112 mEq/L Final  . CO2 06/04/2013 27  19 - 32 mEq/L Final  . Glucose, Bld 06/04/2013 106* 70 - 99 mg/dL Final  . BUN 06/04/2013 15  6 - 23 mg/dL Final  . Creat 06/04/2013 1.25  0.50 - 1.35 mg/dL Final  . Total Bilirubin 06/04/2013 0.5  0.2 - 1.2 mg/dL Final  . Alkaline Phosphatase 06/04/2013 61  39 - 117 U/L Final  . AST 06/04/2013 15  0 - 37 U/L Final  . ALT 06/04/2013 18  0 - 53 U/L Final  . Total Protein 06/04/2013 6.2  6.0 - 8.3 g/dL Final  . Albumin 06/04/2013 4.1  3.5 - 5.2 g/dL Final  . Calcium 06/04/2013 9.1  8.4 - 10.5 mg/dL Final  . GFR, Est African American 06/04/2013 65   Final  . GFR, Est Non African American 06/04/2013 56*  Final   Comment:                            The estimated GFR is a calculation valid for adults (>=55 years old)                          that uses the CKD-EPI algorithm to adjust for age and sex. It is                            not to be used for children, pregnant women, hospitalized patients,                             patients on dialysis, or with  rapidly changing kidney function.                          According to the NKDEP, eGFR >89 is normal, 60-89 shows mild                          impairment, 30-59 shows moderate impairment, 15-29 shows severe                          impairment and <15 is ESRD.                              Past Medical History  Diagnosis Date  . AAA (abdominal aortic aneurysm) 8/12    3.5cm  . CAD (coronary artery disease)   . Colon polyps   . Diabetes mellitus without complication   . Hyperlipidemia   . Hypertension   . Neuromuscular disorder     L2-3,L5-S1 bulging disc /nerve impingement  .  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 Tonsillectomy    . Coronary angioplasty      1989  . Colonoscopy N/A 09/27/2012    Procedure: COLONOSCOPY;  Surgeon: Daneil Dolin, MD;  Location: [facility];  Service: Endoscopy;  Laterality: N/A;  9:30   Current Outpatient Prescriptions on File Prior to Visit  Medication Sig Dispense Refill  . allopurinol (ZYLOPRIM) 300 MG tablet Take 1 tablet (300 mg total) by mouth daily.  90 tablet  3  . aspirin 325 MG EC tablet Take 325 mg by mouth daily.      Marland Kitchen atorvastatin (LIPITOR) 20 MG tablet Take 1 tablet (20 mg total) by mouth daily.  90 tablet  3  . diltiazem (CARDIZEM CD) 240 MG 24 hr capsule Take 1 capsule (240 mg total) by mouth daily.  90 capsule  4  . doxazosin (CARDURA) 4 MG tablet Take 1 tablet (4 mg total) by mouth daily.  90 tablet  3  . glipiZIDE (GLUCOTROL) 5 MG tablet Take 1 tablet (5 mg total) by mouth 2 (two) times daily before a meal.  180 tablet  3  . lisinopril (PRINIVIL,ZESTRIL) 40 MG tablet Take 1 tablet (40 mg total) by mouth daily.  90 tablet  3  . metFORMIN (GLUCOPHAGE) 500 MG tablet Take 1 tablet (500 mg total) by mouth 2 (two) times daily with a meal.  180 tablet  1  . metoprolol succinate (TOPROL-XL) 25 MG 24 hr tablet Take 1 tablet (25 mg total) by mouth daily.  90 tablet  1  .  Multiple Vitamin (MULTIVITAMIN WITH MINERALS) TABS Take 1 tablet by mouth daily.       No current facility-administered medications on file prior to visit.   No Known Allergies History   Social History  . Marital Status: Married    Spouse Name: N/A    Number of Children: N/A  . Years of Education: N/A   Occupational History  . retired     Designer, television/film set    Social History Main Topics  . Smoking status: Former Smoker -- 35 years    Types: Cigarettes    Quit date: 01/02/1988  . Smokeless tobacco: Never Used  . Alcohol Use: Yes     Comment: rare beer, no history of ETOH abuse  . Drug Use: No  . Sexual Activity: Not on file   Other Topics Concern  . Not on file   Social History Narrative  . No narrative on file     Review of Systems  All other systems reviewed and are negative.       Objective:   Physical Exam  Vitals reviewed. Constitutional: He appears well-developed and well-nourished.  HENT:  Mouth/Throat: Oropharynx is clear and moist.  Eyes: Conjunctivae are normal. No scleral icterus.  Neck: Neck supple. No JVD present. No thyromegaly present.  Cardiovascular: Normal rate, regular rhythm, normal heart sounds and intact distal pulses.  Exam reveals no gallop and no friction rub.   No murmur heard. Pulmonary/Chest: Effort normal and breath sounds normal. No respiratory distress. He has no wheezes. He has no rales. He exhibits no tenderness.  Abdominal: Soft. Bowel sounds are normal. He exhibits no distension and no mass. There is no tenderness. There is no rebound and no guarding.  Musculoskeletal: He exhibits edema.  Lymphadenopathy:    He has no cervical adenopathy.          Assessment & Plan:  1. Type II or unspecified type diabetes mellitus without mention of complication, not stated as uncontrolled Diabetes is well controlled. Patient currently taking an aspirin every day. His diabetic foot exam is normal. He has been over a year since his last  diabetic eye exam. I recommended the patient call his ophthalmologist and schedule this appointment.  2. HLD (hyperlipidemia) Patient's cholesterol is well controlled. I congratulated him on his diet exercise and lifestyle changes.  3. HTN (hypertension) Blood pressures elevated, I recommend we increase Toprol-XL to 50 mg by mouth daily and recheck blood pressure in one month.

## 2013-08-04 ENCOUNTER — Other Ambulatory Visit: Payer: Self-pay | Admitting: Family Medicine

## 2013-09-25 ENCOUNTER — Other Ambulatory Visit: Payer: Commercial Managed Care - HMO

## 2013-09-25 ENCOUNTER — Other Ambulatory Visit: Payer: Self-pay | Admitting: Family Medicine

## 2013-09-25 DIAGNOSIS — E119 Type 2 diabetes mellitus without complications: Secondary | ICD-10-CM

## 2013-09-25 DIAGNOSIS — E785 Hyperlipidemia, unspecified: Secondary | ICD-10-CM

## 2013-09-25 LAB — COMPLETE METABOLIC PANEL WITH GFR
ALBUMIN: 4 g/dL (ref 3.5–5.2)
ALT: 13 U/L (ref 0–53)
AST: 14 U/L (ref 0–37)
Alkaline Phosphatase: 61 U/L (ref 39–117)
BUN: 12 mg/dL (ref 6–23)
CALCIUM: 9.1 mg/dL (ref 8.4–10.5)
CHLORIDE: 105 meq/L (ref 96–112)
CO2: 26 mEq/L (ref 19–32)
CREATININE: 1.08 mg/dL (ref 0.50–1.35)
GFR, Est African American: 77 mL/min
GFR, Est Non African American: 67 mL/min
Glucose, Bld: 115 mg/dL — ABNORMAL HIGH (ref 70–99)
POTASSIUM: 4.2 meq/L (ref 3.5–5.3)
SODIUM: 137 meq/L (ref 135–145)
Total Bilirubin: 0.5 mg/dL (ref 0.2–1.2)
Total Protein: 6.1 g/dL (ref 6.0–8.3)

## 2013-09-25 LAB — CBC WITH DIFFERENTIAL/PLATELET
BASOS PCT: 0 % (ref 0–1)
Basophils Absolute: 0 10*3/uL (ref 0.0–0.1)
Eosinophils Absolute: 0.1 10*3/uL (ref 0.0–0.7)
Eosinophils Relative: 3 % (ref 0–5)
HEMATOCRIT: 35.9 % — AB (ref 39.0–52.0)
Hemoglobin: 12.3 g/dL — ABNORMAL LOW (ref 13.0–17.0)
Lymphocytes Relative: 29 % (ref 12–46)
Lymphs Abs: 1.4 10*3/uL (ref 0.7–4.0)
MCH: 29.9 pg (ref 26.0–34.0)
MCHC: 34.3 g/dL (ref 30.0–36.0)
MCV: 87.1 fL (ref 78.0–100.0)
Monocytes Absolute: 0.3 10*3/uL (ref 0.1–1.0)
Monocytes Relative: 6 % (ref 3–12)
Neutro Abs: 3 10*3/uL (ref 1.7–7.7)
Neutrophils Relative %: 62 % (ref 43–77)
Platelets: 136 10*3/uL — ABNORMAL LOW (ref 150–400)
RBC: 4.12 MIL/uL — ABNORMAL LOW (ref 4.22–5.81)
RDW: 15 % (ref 11.5–15.5)
WBC: 4.9 10*3/uL (ref 4.0–10.5)

## 2013-09-25 LAB — LIPID PANEL
Cholesterol: 129 mg/dL (ref 0–200)
HDL: 39 mg/dL — AB (ref >39)
LDL CALC: 68 mg/dL (ref 0–99)
Total CHOL/HDL Ratio: 3.3 Ratio
Triglycerides: 110 mg/dL (ref <150)
VLDL: 22 mg/dL (ref 0–40)

## 2013-09-25 LAB — HEMOGLOBIN A1C
Hgb A1c MFr Bld: 6.2 % — ABNORMAL HIGH (ref <5.7)
MEAN PLASMA GLUCOSE: 131 mg/dL — AB (ref <117)

## 2013-09-30 ENCOUNTER — Encounter: Payer: Self-pay | Admitting: Family Medicine

## 2013-09-30 ENCOUNTER — Ambulatory Visit (INDEPENDENT_AMBULATORY_CARE_PROVIDER_SITE_OTHER): Payer: Commercial Managed Care - HMO | Admitting: Family Medicine

## 2013-09-30 VITALS — BP 108/60 | HR 84 | Temp 97.0°F | Resp 18 | Ht 70.5 in | Wt 227.0 lb

## 2013-09-30 DIAGNOSIS — E119 Type 2 diabetes mellitus without complications: Secondary | ICD-10-CM

## 2013-09-30 DIAGNOSIS — E785 Hyperlipidemia, unspecified: Secondary | ICD-10-CM

## 2013-09-30 DIAGNOSIS — I10 Essential (primary) hypertension: Secondary | ICD-10-CM

## 2013-09-30 NOTE — Progress Notes (Signed)
Subjective:    Patient ID: Juan [name], male    DOB: 12-Jan-1941, 73 y.o.   MRN: [id number]  HPI   Patient is here today for followup of his diabetes, hypertension, hyperlipidemia. He is currently taking diltiazem CD 240 mg by mouth daily, Cardura 4 mg by mouth daily, lisinopril 40 mg by mouth daily, and Toprol-XL 50 mg poqday.  He denies any chest pain, shortness of breath, dyspnea on exertion. Regards to his diabetes, he is still taking metformin 500 mg by mouth twice a day and glipizide 5 mg by mouth twice a day.  He is exercising and trying to watch his diet. The hemoglobin A1c has dropped ever further to 6.2.   He is also taking Lipitor 20 mg by mouth daily. He denies any myalgia or right upper quadrant pain. His recent labwork as listed below: Lab on 09/25/2013  Component Date Value Ref Range Status  . Sodium 09/25/2013 137  135 - 145 mEq/L Final  . Potassium 09/25/2013 4.2  3.5 - 5.3 mEq/L Final  . Chloride 09/25/2013 105  96 - 112 mEq/L Final  . CO2 09/25/2013 26  19 - 32 mEq/L Final  . Glucose, Bld 09/25/2013 115* 70 - 99 mg/dL Final  . BUN 09/25/2013 12  6 - 23 mg/dL Final  . Creat 09/25/2013 1.08  0.50 - 1.35 mg/dL Final  . Total Bilirubin 09/25/2013 0.5  0.2 - 1.2 mg/dL Final  . Alkaline Phosphatase 09/25/2013 61  39 - 117 U/L Final  . AST 09/25/2013 14  0 - 37 U/L Final  . ALT 09/25/2013 13  0 - 53 U/L Final  . Total Protein 09/25/2013 6.1  6.0 - 8.3 g/dL Final  . Albumin 09/25/2013 4.0  3.5 - 5.2 g/dL Final  . Calcium 09/25/2013 9.1  8.4 - 10.5 mg/dL Final  . GFR, Est African American 09/25/2013 77   Final  . GFR, Est Non African American 09/25/2013 67   Final   Comment:                            The estimated GFR is a calculation valid for adults (>=57 years old)                          that uses the CKD-EPI algorithm to adjust for age and sex. It is                            not to be used for children, pregnant women, hospitalized patients,             patients on dialysis, or with rapidly changing kidney function.                          According to the NKDEP, eGFR >89 is normal, 60-89 shows mild                          impairment, 30-59 shows moderate impairment, 15-29 shows severe                          impairment and <15 is ESRD.                             Marland Kitchen  Hemoglobin A1C 09/25/2013 6.2* <5.7 % Final   Comment:                                                                                                 According to the ADA Clinical Practice Recommendations for 2011, when                          HbA1c is used as a screening test:                                                       >=6.5%   Diagnostic of Diabetes Mellitus                                     (if abnormal result is confirmed)                                                     5.7-6.4%   Increased risk of developing Diabetes Mellitus                                                     References:Diagnosis and Classification of Diabetes Mellitus,Diabetes                          YIAX,6553,74(MOLMB 1):S62-S69 and Standards of Medical Care in                                  Diabetes - 2011,Diabetes EMLJ,4492,01 (Suppl 1):S11-S61.                             . Mean Plasma Glucose 09/25/2013 131* <117 mg/dL Final  . WBC 09/25/2013 4.9  4.0 - 10.5 K/uL Final  . RBC 09/25/2013 4.12* 4.22 - 5.81 MIL/uL Final  . Hemoglobin 09/25/2013 12.3* 13.0 - 17.0 g/dL Final  . HCT 09/25/2013 35.9* 39.0 - 52.0 % Final  . MCV 09/25/2013 87.1  78.0 - 100.0 fL Final  . MCH 09/25/2013 29.9  26.0 - 34.0 pg Final  . MCHC 09/25/2013 34.3  30.0 - 36.0 g/dL Final  . RDW 09/25/2013 15.0  11.5 - 15.5 % Final  . Platelets 09/25/2013 136* 150 - 400 K/uL Final  . Neutrophils Relative % 09/25/2013 62  43 - 77 % Final  . Neutro Abs 09/25/2013 3.0  1.7 - 7.7 K/uL Final  .  Lymphocytes Relative 09/25/2013 29  12 - 46 % Final  . Lymphs Abs 09/25/2013 1.4  0.7 - 4.0 K/uL Final  .  Monocytes Relative 09/25/2013 6  3 - 12 % Final  . Monocytes Absolute 09/25/2013 0.3  0.1 - 1.0 K/uL Final  . Eosinophils Relative 09/25/2013 3  0 - 5 % Final  . Eosinophils Absolute 09/25/2013 0.1  0.0 - 0.7 K/uL Final  . Basophils Relative 09/25/2013 0  0 - 1 % Final  . Basophils Absolute 09/25/2013 0.0  0.0 - 0.1 K/uL Final  . Smear Review 09/25/2013 Criteria for review not met   Final  . Cholesterol 09/25/2013 129  0 - 200 mg/dL Final   Comment: ATP III Classification:                                < 200        mg/dL        Desirable                               200 - 239     mg/dL        Borderline High                               >= 240        mg/dL        High                             . Triglycerides 09/25/2013 110  <150 mg/dL Final  . HDL 09/25/2013 39* >39 mg/dL Final  . Total CHOL/HDL Ratio 09/25/2013 3.3   Final  . VLDL 09/25/2013 22  0 - 40 mg/dL Final  . LDL Cholesterol 09/25/2013 68  0 - 99 mg/dL Final   Comment:                            Total Cholesterol/HDL Ratio:CHD Risk                                                 Coronary Heart Disease Risk Table                                                                 Men       Women                                   1/2 Average Risk              3.4        3.3  Average Risk              5.0        4.4                                    2X Average Risk              9.6        7.1                                    3X Average Risk             23.4       11.0                          Use the calculated Patient Ratio above and the CHD Risk table                           to determine the patient's CHD Risk.                          ATP III Classification (LDL):                                < 100        mg/dL         Optimal                               100 - 129     mg/dL         Near or Above Optimal                               130 - 159     mg/dL         Borderline High                                160 - 189     mg/dL         High                                > 190        mg/dL         Very High                              Past Medical History  Diagnosis Date  . AAA (abdominal aortic aneurysm) 8/12    3.5cm  . CAD (coronary artery disease)   . Colon polyps   . Diabetes mellitus without complication   . Hyperlipidemia   . Hypertension   . Neuromuscular disorder     L2-3,L5-S1 bulging disc /nerve impingement  . 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 Tonsillectomy    . Coronary angioplasty  1989  . Colonoscopy N/A 09/27/2012    Procedure: COLONOSCOPY;  Surgeon: Daneil Dolin, MD;  [facility];  Service: Endoscopy;  Laterality: N/A;  9:30   Current Outpatient Prescriptions on File Prior to Visit  Medication Sig Dispense Refill  . allopurinol (ZYLOPRIM) 300 MG tablet Take 1 tablet (300 mg total) by mouth daily.  90 tablet  3  . aspirin 325 MG EC tablet Take 325 mg by mouth daily.      Marland Kitchen atorvastatin (LIPITOR) 20 MG tablet Take 1 tablet (20 mg total) by mouth daily.  90 tablet  3  . diltiazem (CARDIZEM CD) 240 MG 24 hr capsule Take 1 capsule (240 mg total) by mouth daily.  90 capsule  4  . doxazosin (CARDURA) 4 MG tablet Take 1 tablet (4 mg total) by mouth daily.  90 tablet  3  . glipiZIDE (GLUCOTROL) 5 MG tablet Take 1 tablet (5 mg total) by mouth 2 (two) times daily before a meal.  180 tablet  3  . lisinopril (PRINIVIL,ZESTRIL) 40 MG tablet Take 1 tablet (40 mg total) by mouth daily.  90 tablet  3  . metFORMIN (GLUCOPHAGE) 500 MG tablet TAKE 1 TABLET TWICE DAILY WITH MEALS  180 tablet  1  . metoprolol succinate (TOPROL-XL) 25 MG 24 hr tablet Take 1 tablet (25 mg total) by mouth daily.  90 tablet  1  . Multiple Vitamin (MULTIVITAMIN WITH MINERALS) TABS Take 1 tablet by mouth daily.       No current facility-administered medications on file prior to visit.   No Known  Allergies History   Social History  . Marital Status: Married    Spouse Name: N/A    Number of Children: N/A  . Years of Education: N/A   Occupational History  . retired     Designer, television/film set    Social History Main Topics  . Smoking status: Former Smoker -- 35 years    Types: Cigarettes    Quit date: 01/02/1988  . Smokeless tobacco: Never Used  . Alcohol Use: Yes     Comment: rare beer, no history of ETOH abuse  . Drug Use: No  . Sexual Activity: Not on file   Other Topics Concern  . Not on file   Social History Narrative  . No narrative on file     Review of Systems  All other systems reviewed and are negative.      Objective:   Physical Exam  Vitals reviewed. Constitutional: He appears well-developed and well-nourished.  HENT:  Mouth/Throat: Oropharynx is clear and moist.  Eyes: Conjunctivae are normal. No scleral icterus.  Neck: Neck supple. No JVD present. No thyromegaly present.  Cardiovascular: Normal rate, regular rhythm, normal heart sounds and intact distal pulses.  Exam reveals no gallop and no friction rub.   No murmur heard. Pulmonary/Chest: Effort normal and breath sounds normal. No respiratory distress. He has no wheezes. He has no rales. He exhibits no tenderness.  Abdominal: Soft. Bowel sounds are normal. He exhibits no distension and no mass. There is no tenderness. There is no rebound and no guarding.  Musculoskeletal: He exhibits edema.  Lymphadenopathy:    He has no cervical adenopathy.          Assessment & Plan:  1. Type II or unspecified type diabetes mellitus without mention of complication, not stated as uncontrolled Diabetes is well controlled. Patient currently taking an aspirin every day. His diabetic foot exam is normal.   2. HLD (hyperlipidemia)  Patient's cholesterol is well controlled. I congratulated him on his diet exercise and lifestyle changes.  3. HTN (hypertension) Is now well controlled. We'll make no changes in his  medication at this time.  Recheck an ultrasound of his AAA in November 2015.

## 2013-10-01 ENCOUNTER — Other Ambulatory Visit: Payer: Self-pay | Admitting: Family Medicine

## 2013-10-01 LAB — PSA: PSA: 0.67 ng/mL (ref <=4.00)

## 2013-10-01 NOTE — Telephone Encounter (Signed)
Strips sent to [facility] via fax

## 2013-10-25 ENCOUNTER — Other Ambulatory Visit: Payer: Self-pay | Admitting: *Deleted

## 2013-10-25 MED ORDER — TRUEPLUS LANCETS 28G MISC: Status: DC

## 2013-10-25 NOTE — Telephone Encounter (Signed)
Received fax requesting refill on lancets.   Refill appropriate and filled per protocol.

## 2013-11-20 ENCOUNTER — Ambulatory Visit (INDEPENDENT_AMBULATORY_CARE_PROVIDER_SITE_OTHER): Payer: Commercial Managed Care - HMO | Admitting: *Deleted

## 2013-11-20 DIAGNOSIS — Z23 Encounter for immunization: Secondary | ICD-10-CM

## 2013-11-20 NOTE — Progress Notes (Signed)
Patient ID: Juan [name], male   DOB: [birth year], 73 y.o.   MRN: [id number] Patient here for Flu shot only.

## 2013-12-05 ENCOUNTER — Telehealth: Payer: Self-pay | Admitting: Family Medicine

## 2013-12-05 NOTE — Telephone Encounter (Signed)
John from [employer] regarding an rx we should have received for diabetic testing supplies  [phone]

## 2013-12-05 NOTE — Telephone Encounter (Signed)
lmtrc

## 2013-12-08 NOTE — Telephone Encounter (Signed)
Pt called back in reference to RX, pt does not who [facility] discount is and states he just received all his supplies last week.

## 2013-12-22 ENCOUNTER — Other Ambulatory Visit: Payer: Self-pay | Admitting: Family Medicine

## 2013-12-22 NOTE — Telephone Encounter (Signed)
Medication refilled per protocol. 

## 2014-01-05 ENCOUNTER — Ambulatory Visit: Payer: Medicare Other | Admitting: Family

## 2014-01-05 ENCOUNTER — Other Ambulatory Visit (HOSPITAL_COMMUNITY): Payer: Medicare Other

## 2014-02-13 ENCOUNTER — Other Ambulatory Visit: Payer: Commercial Managed Care - HMO

## 2014-02-13 DIAGNOSIS — E119 Type 2 diabetes mellitus without complications: Secondary | ICD-10-CM

## 2014-02-13 DIAGNOSIS — E785 Hyperlipidemia, unspecified: Secondary | ICD-10-CM

## 2014-02-13 LAB — CBC WITH DIFFERENTIAL/PLATELET
BASOS ABS: 0 10*3/uL (ref 0.0–0.1)
BASOS PCT: 0 % (ref 0–1)
EOS ABS: 0.2 10*3/uL (ref 0.0–0.7)
Eosinophils Relative: 3 % (ref 0–5)
HCT: 34.6 % — ABNORMAL LOW (ref 39.0–52.0)
Hemoglobin: 12 g/dL — ABNORMAL LOW (ref 13.0–17.0)
Lymphocytes Relative: 29 % (ref 12–46)
Lymphs Abs: 1.5 10*3/uL (ref 0.7–4.0)
MCH: 31 pg (ref 26.0–34.0)
MCHC: 34.7 g/dL (ref 30.0–36.0)
MCV: 89.4 fL (ref 78.0–100.0)
MONO ABS: 0.4 10*3/uL (ref 0.1–1.0)
MPV: 9.6 fL (ref 9.4–12.4)
Monocytes Relative: 8 % (ref 3–12)
Neutro Abs: 3.2 10*3/uL (ref 1.7–7.7)
Neutrophils Relative %: 60 % (ref 43–77)
PLATELETS: 128 10*3/uL — AB (ref 150–400)
RBC: 3.87 MIL/uL — ABNORMAL LOW (ref 4.22–5.81)
RDW: 13.8 % (ref 11.5–15.5)
WBC: 5.3 10*3/uL (ref 4.0–10.5)

## 2014-02-13 LAB — COMPLETE METABOLIC PANEL WITH GFR
ALT: 11 U/L (ref 0–53)
AST: 13 U/L (ref 0–37)
Albumin: 3.9 g/dL (ref 3.5–5.2)
Alkaline Phosphatase: 58 U/L (ref 39–117)
BILIRUBIN TOTAL: 0.6 mg/dL (ref 0.2–1.2)
BUN: 10 mg/dL (ref 6–23)
CO2: 26 meq/L (ref 19–32)
Calcium: 9 mg/dL (ref 8.4–10.5)
Chloride: 105 mEq/L (ref 96–112)
Creat: 1.13 mg/dL (ref 0.50–1.35)
GFR, EST AFRICAN AMERICAN: 73 mL/min
GFR, EST NON AFRICAN AMERICAN: 63 mL/min
GLUCOSE: 98 mg/dL (ref 70–99)
Potassium: 4 mEq/L (ref 3.5–5.3)
Sodium: 141 mEq/L (ref 135–145)
Total Protein: 6.1 g/dL (ref 6.0–8.3)

## 2014-02-13 LAB — LIPID PANEL
Cholesterol: 129 mg/dL (ref 0–200)
HDL: 37 mg/dL — ABNORMAL LOW (ref >39)
LDL CALC: 75 mg/dL (ref 0–99)
TRIGLYCERIDES: 87 mg/dL (ref <150)
Total CHOL/HDL Ratio: 3.5 Ratio
VLDL: 17 mg/dL (ref 0–40)

## 2014-02-13 LAB — HEMOGLOBIN A1C
Hgb A1c MFr Bld: 5.8 % — ABNORMAL HIGH (ref <5.7)
MEAN PLASMA GLUCOSE: 120 mg/dL — AB (ref <117)

## 2014-02-20 ENCOUNTER — Encounter: Payer: Self-pay | Admitting: Family Medicine

## 2014-02-20 ENCOUNTER — Ambulatory Visit (INDEPENDENT_AMBULATORY_CARE_PROVIDER_SITE_OTHER): Payer: Commercial Managed Care - HMO | Admitting: Family Medicine

## 2014-02-20 VITALS — BP 140/70 | HR 78 | Temp 97.1°F | Resp 18 | Ht 71.0 in | Wt 217.0 lb

## 2014-02-20 DIAGNOSIS — E119 Type 2 diabetes mellitus without complications: Secondary | ICD-10-CM

## 2014-02-20 DIAGNOSIS — E785 Hyperlipidemia, unspecified: Secondary | ICD-10-CM

## 2014-02-20 DIAGNOSIS — I10 Essential (primary) hypertension: Secondary | ICD-10-CM

## 2014-02-20 MED ORDER — GLIPIZIDE 5 MG PO TABS: 5.0000 mg | ORAL_TABLET | Freq: Two times a day (BID) | ORAL | Status: DC

## 2014-02-20 MED ORDER — ATORVASTATIN CALCIUM 20 MG PO TABS: 20.0000 mg | ORAL_TABLET | Freq: Every day | ORAL | Status: DC

## 2014-02-20 MED ORDER — DOXAZOSIN MESYLATE 4 MG PO TABS: 4.0000 mg | ORAL_TABLET | Freq: Every day | ORAL | Status: DC

## 2014-02-20 MED ORDER — ALLOPURINOL 300 MG PO TABS: 300.0000 mg | ORAL_TABLET | Freq: Every day | ORAL | Status: DC

## 2014-02-20 MED ORDER — METOPROLOL SUCCINATE ER 25 MG PO TB24: 25.0000 mg | ORAL_TABLET | Freq: Every day | ORAL | Status: DC

## 2014-02-20 MED ORDER — LISINOPRIL 40 MG PO TABS: 40.0000 mg | ORAL_TABLET | Freq: Every day | ORAL | Status: DC

## 2014-02-20 MED ORDER — DILTIAZEM HCL ER COATED BEADS 240 MG PO CP24: 240.0000 mg | ORAL_CAPSULE | Freq: Every day | ORAL | Status: DC

## 2014-02-20 MED ORDER — METFORMIN HCL 500 MG PO TABS: 500.0000 mg | ORAL_TABLET | Freq: Two times a day (BID) | ORAL | Status: DC

## 2014-02-20 NOTE — Progress Notes (Signed)
Subjective:    Patient ID: Juan [name], male    DOB: 08/30/1940, 73 y.o.   MRN: [id number]  HPI Patient is a very pleasant 73 year old white male with a history of diabetes mellitus type 2. He is here today for follow-up of that. His hemoglobin A1c has fallen from 6.2-5.8. He denies any hypoglycemic episodes. He is currently on metformin as well as glipizide. He is taking glipizide 5 mg twice a day. He does complain of some slight numbness in his feet although there is no appreciable numbness to 10 g monofilament. His diabetic eye exam is up-to-date. His diabetic foot exam was performed today. He is due for urine microalbumin at his next visit. Blood pressure is marginal today at 140/70. Furthermore the patient has discontinue metoprolol at some point accidentally. I would like the patient to resume metoprolol. He denies any chest pain shortness of breath or dyspnea on exertion. His LDL cholesterol is slightly elevated today at 75. He is currently on Lipitor 20 mg by mouth daily. I've given the patient the option of increasing Lipitor versus working on therapeutic lifestyle changes to try to achieve a goal LDL cholesterol less than 70. Patient also has a history of a AAA. He was due for repeat ultrasound in October. The patient has canceled that and will reschedule it for after the new year. Past Medical History  Diagnosis Date  . AAA (abdominal aortic aneurysm) 8/12    3.5cm  . CAD (coronary artery disease)   . Colon polyps   . Diabetes mellitus without complication   . Hyperlipidemia   . Hypertension   . Neuromuscular disorder     L2-3,L5-S1 bulging disc /nerve impingement  . 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 Tonsillectomy    . Coronary angioplasty      1989  . Colonoscopy N/A 09/27/2012    Procedure: COLONOSCOPY;  Surgeon: Daneil Dolin, MD;  Location: [facility];  Service: Endoscopy;  Laterality: N/A;  9:30    Current Outpatient Prescriptions on File Prior to Visit  Medication Sig Dispense Refill  . aspirin 325 MG EC tablet Take 325 mg by mouth daily.    Marland Kitchen glucose blood (TRUETEST TEST) test strip 1 each by Other route as needed for other. Check bs bid  #200 / 11 refills    . Multiple Vitamin (MULTIVITAMIN WITH MINERALS) TABS Take 1 tablet by mouth daily.    . TRUEPLUS LANCETS 28G MISC Use as directed to monitor FSBS. DX: 250.00 210 each 3   No current facility-administered medications on file prior to visit.   No Known Allergies History   Social History  . Marital Status: Married    Spouse Name: N/A    Number of Children: N/A  . Years of Education: N/A   Occupational History  . retired     Designer, television/film set    Social History Main Topics  . Smoking status: Former Smoker -- 35 years    Types: Cigarettes    Quit date: 01/02/1988  . Smokeless tobacco: Never Used  . Alcohol Use: Yes     Comment: rare beer, no history of ETOH abuse  . Drug Use: No  . Sexual Activity: Not on file   Other Topics Concern  . Not on file   Social History Narrative      Review of Systems  All other systems reviewed and are negative.      Objective:  Physical Exam  Constitutional: He appears well-developed and well-nourished. No distress.  Neck: Neck supple. No JVD present. No thyromegaly present.  Cardiovascular: Normal rate, regular rhythm, normal heart sounds and intact distal pulses.   No murmur heard. Pulmonary/Chest: Effort normal and breath sounds normal. No respiratory distress. He has no wheezes. He has no rales. He exhibits no tenderness.  Abdominal: Soft. Bowel sounds are normal. He exhibits no distension. There is no tenderness. There is no rebound and no guarding.  Musculoskeletal: He exhibits no edema.  Lymphadenopathy:    He has no cervical adenopathy.  Skin: He is not diaphoretic.  Vitals reviewed.         Assessment & Plan:  HLD (hyperlipidemia)  Essential hypertension,  benign  Diabetes mellitus type II, controlled  Add metoprolol XL 25 mg by mouth daily to help achieve a goal blood pressure closer to 135/85. I also would like to resume a beta blocker in this patient with a history of coronary artery disease. Patient Simon A1c is excellent. I will decrease his glipizide to 5 mg once a day to avoid the risk of hypoglycemia. I would like to recheck a urine microalbumin at his next office visit. I encouraged the patient to have annual diabetic eye exams. LDL cholesterol is slightly elevated at 75. I have recommended decreasing saturated fat and increasing aerobic exercise to manage. Recheck in 3 months. Also recommended that the patient schedule an ultrasound of the abdomen to monitor his AAA at his convenience

## 2014-03-19 ENCOUNTER — Telehealth: Payer: Self-pay | Admitting: Family Medicine

## 2014-03-19 DIAGNOSIS — Z0100 Encounter for examination of eyes and vision without abnormal findings: Principal | ICD-10-CM

## 2014-03-19 DIAGNOSIS — E119 Type 2 diabetes mellitus without complications: Secondary | ICD-10-CM

## 2014-03-19 NOTE — Telephone Encounter (Signed)
[phone] Pt is wanting to be referred to [facility]

## 2014-03-20 NOTE — Telephone Encounter (Signed)
Contacted pt and spoke to his wife she is stating that pt is needing a referral to [facility] for AAA aneursym, I informed her that pt is already Established with them and should not need a referral from [place], pt has [employer] so informed her that I can do the [facility] referral but an appointment has to be made first so that I can put it it, pt is going to have husband to call and schedule appt and cal me back to let me know, also pt needs a referral to eye doctor for Diabetic Eye exam, referral has been placed.

## 2014-03-27 ENCOUNTER — Other Ambulatory Visit: Payer: Self-pay | Admitting: Family Medicine

## 2014-03-31 ENCOUNTER — Telehealth: Payer: Self-pay | Admitting: *Deleted

## 2014-03-31 NOTE — Telephone Encounter (Signed)
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

## 2014-04-13 ENCOUNTER — Encounter: Payer: Self-pay | Admitting: Family

## 2014-04-14 ENCOUNTER — Telehealth: Payer: Self-pay | Admitting: Family Medicine

## 2014-04-14 ENCOUNTER — Ambulatory Visit (HOSPITAL_COMMUNITY)
Admission: RE | Admit: 2014-04-14 | Discharge: 2014-04-14 | Disposition: A | Discharge status: Home / Self Care | Payer: Commercial Managed Care - HMO | Source: Ambulatory Visit | Attending: Family | Admitting: Family

## 2014-04-14 ENCOUNTER — Ambulatory Visit (INDEPENDENT_AMBULATORY_CARE_PROVIDER_SITE_OTHER): Payer: Commercial Managed Care - HMO | Admitting: Family

## 2014-04-14 ENCOUNTER — Encounter: Payer: Self-pay | Admitting: Family

## 2014-04-14 VITALS — BP 184/88 | HR 62 | Resp 16 | Ht 70.5 in | Wt 214.0 lb

## 2014-04-14 DIAGNOSIS — I714 Abdominal aortic aneurysm, without rupture, unspecified: Secondary | ICD-10-CM

## 2014-04-14 DIAGNOSIS — Z87891 Personal history of nicotine dependence: Secondary | ICD-10-CM

## 2014-04-14 DIAGNOSIS — I10 Essential (primary) hypertension: Secondary | ICD-10-CM | POA: Diagnosis not present

## 2014-04-14 NOTE — Telephone Encounter (Signed)
Juan [name] has called and the pt has a apt on Feb 23 with Dr Gershon Crane she was not sure of the diagnosis because we referred the patient but there are needing a [employer] referral

## 2014-04-14 NOTE — Patient Instructions (Signed)
Abdominal Aortic Aneurysm An aneurysm is a weakened or damaged part of an artery wall that bulges from the normal force of blood pumping through the body. An abdominal aortic aneurysm is an aneurysm that occurs in the lower part of the aorta, the main artery of the body.  The major concern with an abdominal aortic aneurysm is that it can enlarge and burst (rupture) or blood can flow between the layers of the wall of the aorta through a tear (aorticdissection). Both of these conditions can cause bleeding inside the body and can be life threatening unless diagnosed and treated promptly. CAUSES  The exact cause of an abdominal aortic aneurysm is unknown. Some contributing factors are:   A hardening of the arteries caused by the buildup of fat and other substances in the lining of a blood vessel (arteriosclerosis).  Inflammation of the walls of an artery (arteritis).   Connective tissue diseases, such as Marfan syndrome.   Abdominal trauma.   An infection, such as syphilis or staphylococcus, in the wall of the aorta (infectious aortitis) caused by bacteria. RISK FACTORS  Risk factors that contribute to an abdominal aortic aneurysm may include:  Age older than 60 years.   High blood pressure (hypertension).  Male gender.  Ethnicity (white race).  Obesity.  Family history of aneurysm (first degree relatives only).  Tobacco use. PREVENTION  The following healthy lifestyle habits may help decrease your risk of abdominal aortic aneurysm:  Quitting smoking. Smoking can raise your blood pressure and cause arteriosclerosis.  Limiting or avoiding alcohol.  Keeping your blood pressure, blood sugar level, and cholesterol levels within normal limits.  Decreasing your salt intake. In somepeople, too much salt can raise blood pressure and increase your risk of abdominal aortic aneurysm.  Eating a diet low in saturated fats and cholesterol.  Increasing your fiber intake by including  whole grains, vegetables, and fruits in your diet. Eating these foods may help lower blood pressure.  Maintaining a healthy weight.  Staying physically active and exercising regularly. SYMPTOMS  The symptoms of abdominal aortic aneurysm may vary depending on the size and rate of growth of the aneurysm.Most grow slowly and do not have any symptoms. When symptoms do occur, they may include:  Pain (abdomen, side, lower back, or groin). The pain may vary in intensity. A sudden onset of severe pain may indicate that the aneurysm has ruptured.  Feeling full after eating only small amounts of food.  Nausea or vomiting or both.  Feeling a pulsating lump in the abdomen.  Feeling faint or passing out. DIAGNOSIS  Since most unruptured abdominal aortic aneurysms have no symptoms, they are often discovered during diagnostic exams for other conditions. An aneurysm may be found during the following procedures:  Ultrasonography (A one-time screening for abdominal aortic aneurysm by ultrasonography is also recommended for all men aged 65-75 years who have ever smoked).  X-ray exams.  A computed tomography (CT).  Magnetic resonance imaging (MRI).  Angiography or arteriography. TREATMENT  Treatment of an abdominal aortic aneurysm depends on the size of your aneurysm, your age, and risk factors for rupture. Medication to control blood pressure and pain may be used to manage aneurysms smaller than 6 cm. Regular monitoring for enlargement may be recommended by your caregiver if:  The aneurysm is 3-4 cm in size (an annual ultrasonography may be recommended).  The aneurysm is 4-4.5 cm in size (an ultrasonography every 6 months may be recommended).  The aneurysm is larger than 4.5 cm in   size (your caregiver may ask that you be examined by a vascular surgeon). If your aneurysm is larger than 6 cm, surgical repair may be recommended. There are two main methods for repair of an aneurysm:   Endovascular  repair (a minimally invasive surgery). This is done most often.  Open repair. This method is used if an endovascular repair is not possible. Document Released: 12/07/2004 Document Revised: 06/24/2012 Document Reviewed: 03/29/2012 Exit[employer] Patient Information 2015 [employer]. This information is not intended to replace advice given to you by your health care provider. Make sure you discuss any questions you have with your health care provider.  

## 2014-04-14 NOTE — Progress Notes (Signed)
[facility]  Established Abdominal Aortic Aneurysm  History of Present Illness  Juan [name] is a 74 y.o. ([birth year]) male patient initially referred to Dr. Kellie Simmering for evaluation regarding abdominal aortic aneurysm. Patient had an MR scan in 2012 for back problems and was found to have a 3.5 cm infrarenal abdominal aortic aneurysm. He had injections for his back problem and that his resolved. In September, 2013 he had ultrasound which revealed enlargement of the aneurysm to 3.7 x 4.1 cm in maximum diameter.  The patient does not have back or abdominal pain. 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 The patient states rare tightness in calves after walking over an hour. The patient denies history of stroke or TIA symptoms. He had balloon angioplasty of a coronary artery in 1989 and stopped smoking then; patient denies history of MI. Patient states he is working with his PCP on getting his blood pressure under better control, states it runs 884-166 systolic.   He takes a daily 325 mg ASA and a daily statin, no other antiplatelet agents nor anticoagulants.   Pt Diabetic: Yes, states under control Pt smoker: former smoker, quit at age 39  Past Medical History  Diagnosis Date  . AAA (abdominal aortic aneurysm) 8/12    3.5cm  . CAD (coronary artery disease)   . Colon polyps   . Diabetes mellitus without complication   . Hyperlipidemia   . Hypertension   . Neuromuscular disorder     L2-3,L5-S1 bulging disc /nerve impingement  . 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 Tonsillectomy    . Coronary angioplasty      1989  . Colonoscopy N/A 09/27/2012    Procedure: COLONOSCOPY;  Surgeon: Daneil Dolin, MD;  Location: [facility];  Service: Endoscopy;  Laterality: N/A;  9:30   Social History History   Social History  . Marital Status: Married    Spouse  Name: N/A    Number of Children: N/A  . Years of Education: N/A   Occupational History  . retired     Designer, television/film set    Social History Main Topics  . Smoking status: Former Smoker -- 35 years    Types: Cigarettes    Quit date: 01/02/1988  . Smokeless tobacco: Never Used  . Alcohol Use: Yes     Comment: rare beer, no history of ETOH abuse  . Drug Use: No  . Sexual Activity: Not on file   Other Topics Concern  . Not on file   Social History Narrative   Family History Family History  Problem Relation Age of Onset  . Diabetes Mother   . Heart disease Mother     After age 53  . Heart attack Mother   . Hypertension Mother   . Diabetes Father   . Heart disease Father     After age 6  . Hypertension Father   . Heart attack Father   . Colon cancer Neg Hx   . Brain cancer Sister   . Cancer Sister     Brain  . Diabetes Sister   . Hypertension Sister   . Cancer Brother     Chest Tumor  . Diabetes Brother   . Hypertension Brother   . Deep vein thrombosis Sister   . Diabetes Sister   . Diabetes Brother     Bilateral leg    Current Outpatient Prescriptions on  File Prior to Visit  Medication Sig Dispense Refill  . allopurinol (ZYLOPRIM) 300 MG tablet TAKE 1 TABLET EVERY DAY 90 tablet 3  . aspirin 325 MG EC tablet Take 325 mg by mouth daily.    Marland Kitchen atorvastatin (LIPITOR) 20 MG tablet Take 1 tablet (20 mg total) by mouth daily. 90 tablet 3  . diltiazem (CARDIZEM CD) 240 MG 24 hr capsule Take 1 capsule (240 mg total) by mouth daily. 90 capsule 4  . glipiZIDE (GLUCOTROL) 5 MG tablet TAKE 1 TABLET TWO TIMES DAILY BEFORE A MEAL 180 tablet 3  . glucose blood (TRUETEST TEST) test strip 1 each by Other route as needed for other. Check bs bid  #200 / 11 refills    . lisinopril (PRINIVIL,ZESTRIL) 40 MG tablet TAKE 1 TABLET EVERY DAY 90 tablet 3  . metFORMIN (GLUCOPHAGE) 500 MG tablet Take 1 tablet (500 mg total) by mouth 2 (two) times daily with a meal. 180 tablet 3  . metoprolol  succinate (TOPROL-XL) 25 MG 24 hr tablet Take 1 tablet (25 mg total) by mouth daily. 90 tablet 3  . Multiple Vitamin (MULTIVITAMIN WITH MINERALS) TABS Take 1 tablet by mouth daily.    . TRUEPLUS LANCETS 28G MISC Use as directed to monitor FSBS. DX: 250.00 210 each 3  . doxazosin (CARDURA) 4 MG tablet Take 1 tablet (4 mg total) by mouth daily. (Patient not taking: Reported on 04/14/2014) 90 tablet 3   No current facility-administered medications on file prior to visit.   No Known Allergies  ROS: See HPI for pertinent positives and negatives.  Physical Examination  Filed Vitals:   04/14/14 1037  BP: 184/88  Pulse: 62  Resp: 16  Height: 5' 10.5" (1.791 m)  Weight: 214 lb (97.07 kg)  SpO2: 100%   Body mass index is 30.26 kg/(m^2).  General: A&O x 3, WD, Obese male  Pulmonary: Sym exp, good air movt, CTAB, no rales, rhonchi, or wheezing.   Cardiac: RRR, Nl S1, S2, no detected murmur.  Carotid Bruits Left Right   Negative Negative  Aorta is not palpable Radial pulses are 3+ palpable and equal.   VASCULAR EXAM:     LE Pulses LEFT RIGHT       FEMORAL palpable palpable   POPLITEAL not palpable  not palpable   POSTERIOR TIBIAL  palpable   palpable    DORSALIS PEDIS  ANTERIOR TIBIAL not palpable  palpable      Gastrointestinal: soft, NTND, -G/R, - HSM, - palpable masses, - CVAT B.  Musculoskeletal: M/S 5/5 throughout, Extremities without ischemic changes.   Neurologic: CN 2-12 intact , Pain and light touch intact in extremities, Motor exam as listed above.    Non-Invasive Vascular Imaging  AAA Duplex (04/14/2014) ABDOMINAL AORTA DUPLEX EVALUATION    INDICATION: Abdominal aortic aneurysm     PREVIOUS INTERVENTION(S): NA    DUPLEX EXAM:     LOCATION DIAMETER AP (cm)  DIAMETER TRANSVERSE (cm) VELOCITIES (cm/sec)  Aorta Proximal 2.74 2.65 61  Aorta Mid 2.07 2.10 79  Aorta Distal 3.35 3.30 51  Right Common Iliac Artery 1.10 1.15 252  Left Common Iliac Artery 0.80 0.90 208    Previous max aortic diameter:  3.36 x 3.25 Date: 12/30/2012  ADDITIONAL FINDINGS:     IMPRESSION: Abdominal aortic aneurysm present measuring 3.35cm AP x 3.30cm TRV, with non-occluding intramural thrombus present. Mildly elevated bilateral common iliac artery velocities which may suggest greater than 50% stenosis.    Compared to the previous exam:  Essentially unchanged since previous study on 12/30/2012.     Medical Decision Making  The patient is a 74 y.o. male who presents with asymptomatic AAA with no increase in size. Patient was advised to work closely with his PCP to get his blood pressure under control.  Face to face time with patient was 25 minutes. Over 50% of this time was spent on counseling and coordination of care.    Based on this patient's exam and diagnostic studies, the patient will follow up in 1nyear  with the following studies: AAA Duplex.  Consideration for repair of AAA would be made when the size approaches 4.8 or 5.0 cm, growth > 1 cm/yr, and symptomatic status.  I emphasized the importance of maximal medical management including strict control of blood pressure, blood glucose, and lipid levels, antiplatelet agents, obtaining regular exercise, and continued cessation of smoking.   The patient was given information about AAA including signs, symptoms, treatment, and how to minimize the risk of enlargement and rupture of aneurysms.    The patient was advised to call 911 should the patient experience sudden onset abdominal or back pain.   Thank you for allowing [place] to participate in this patient's care.  Clemon Chambers, RN, MSN, FNP-C [facility] Office: [facility] Physician: Kellie Simmering  04/14/2014, 10:53  AM

## 2014-04-15 ENCOUNTER — Other Ambulatory Visit: Payer: Self-pay | Admitting: *Deleted

## 2014-04-15 DIAGNOSIS — I714 Abdominal aortic aneurysm, without rupture, unspecified: Secondary | ICD-10-CM

## 2014-04-15 NOTE — Telephone Encounter (Signed)
Submitted [employer] referral thru [facility] for authorization for Dr. Rutherford Guys with authorization number [id number]  Dx: E11.9, Z01.00  Number of visits: 6  Start Date: 05/05/14 end date 10/13/14  Once receive paper copy from [employer] will fax authorization to [employer]

## 2014-05-05 DIAGNOSIS — H5210 Myopia, unspecified eye: Secondary | ICD-10-CM | POA: Diagnosis not present

## 2014-05-05 DIAGNOSIS — H5203 Hypermetropia, bilateral: Secondary | ICD-10-CM | POA: Diagnosis not present

## 2014-05-05 LAB — HM DIABETES EYE EXAM

## 2014-05-20 ENCOUNTER — Other Ambulatory Visit: Payer: Commercial Managed Care - HMO

## 2014-05-20 DIAGNOSIS — E785 Hyperlipidemia, unspecified: Secondary | ICD-10-CM | POA: Diagnosis not present

## 2014-05-20 DIAGNOSIS — E119 Type 2 diabetes mellitus without complications: Secondary | ICD-10-CM

## 2014-05-20 LAB — CBC WITH DIFFERENTIAL/PLATELET
BASOS PCT: 0 % (ref 0–1)
Basophils Absolute: 0 10*3/uL (ref 0.0–0.1)
Eosinophils Absolute: 0.2 10*3/uL (ref 0.0–0.7)
Eosinophils Relative: 3 % (ref 0–5)
HCT: 38.6 % — ABNORMAL LOW (ref 39.0–52.0)
Hemoglobin: 12.6 g/dL — ABNORMAL LOW (ref 13.0–17.0)
LYMPHS ABS: 1.5 10*3/uL (ref 0.7–4.0)
Lymphocytes Relative: 28 % (ref 12–46)
MCH: 30.1 pg (ref 26.0–34.0)
MCHC: 32.6 g/dL (ref 30.0–36.0)
MCV: 92.3 fL (ref 78.0–100.0)
MONOS PCT: 8 % (ref 3–12)
MPV: 9 fL (ref 8.6–12.4)
Monocytes Absolute: 0.4 10*3/uL (ref 0.1–1.0)
Neutro Abs: 3.2 10*3/uL (ref 1.7–7.7)
Neutrophils Relative %: 61 % (ref 43–77)
PLATELETS: 146 10*3/uL — AB (ref 150–400)
RBC: 4.18 MIL/uL — AB (ref 4.22–5.81)
RDW: 14.1 % (ref 11.5–15.5)
WBC: 5.3 10*3/uL (ref 4.0–10.5)

## 2014-05-20 LAB — COMPLETE METABOLIC PANEL WITH GFR
ALBUMIN: 3.8 g/dL (ref 3.5–5.2)
ALT: 11 U/L (ref 0–53)
AST: 14 U/L (ref 0–37)
Alkaline Phosphatase: 61 U/L (ref 39–117)
BUN: 12 mg/dL (ref 6–23)
CO2: 24 mEq/L (ref 19–32)
Calcium: 9 mg/dL (ref 8.4–10.5)
Chloride: 102 mEq/L (ref 96–112)
Creat: 1.15 mg/dL (ref 0.50–1.35)
GFR, EST NON AFRICAN AMERICAN: 62 mL/min
GFR, Est African American: 72 mL/min
Glucose, Bld: 134 mg/dL — ABNORMAL HIGH (ref 70–99)
Potassium: 3.9 mEq/L (ref 3.5–5.3)
Sodium: 138 mEq/L (ref 135–145)
Total Bilirubin: 0.5 mg/dL (ref 0.2–1.2)
Total Protein: 6 g/dL (ref 6.0–8.3)

## 2014-05-20 LAB — LIPID PANEL
CHOL/HDL RATIO: 3.3 ratio
Cholesterol: 125 mg/dL (ref 0–200)
HDL: 38 mg/dL — ABNORMAL LOW (ref >=40)
LDL Cholesterol: 66 mg/dL (ref 0–99)
TRIGLYCERIDES: 104 mg/dL (ref <150)
VLDL: 21 mg/dL (ref 0–40)

## 2014-05-20 LAB — HEMOGLOBIN A1C
Hgb A1c MFr Bld: 6.2 % — ABNORMAL HIGH (ref <5.7)
Mean Plasma Glucose: 131 mg/dL — ABNORMAL HIGH (ref <117)

## 2014-05-26 ENCOUNTER — Encounter: Payer: Self-pay | Admitting: Family Medicine

## 2014-05-26 ENCOUNTER — Ambulatory Visit (INDEPENDENT_AMBULATORY_CARE_PROVIDER_SITE_OTHER): Payer: Commercial Managed Care - HMO | Admitting: Family Medicine

## 2014-05-26 VITALS — BP 110/58 | HR 86 | Temp 97.9°F | Resp 18 | Ht 70.5 in | Wt 216.0 lb

## 2014-05-26 DIAGNOSIS — Z0000 Encounter for general adult medical examination without abnormal findings: Secondary | ICD-10-CM | POA: Diagnosis not present

## 2014-05-26 NOTE — Progress Notes (Signed)
Subjective:    Patient ID: Juan [name], male    DOB: [birth year], 74 y.o.   MRN: [id number]  HPI Patient is here today for complete physical exam. He has no concerns. His last colonoscopy was 2014. They did find several tubular adenomas. He is due again in 2019. His last prostate exam was in July of last year and is not due until July of this year. Prevnar 13, Pneumovax 23, and the shingles vaccine are all up-to-date. We did discuss tetanus shot in the patient does not want a tetanus shot today. Patient's AAA was recently evaluated in February and there was no significant change in the size of his aneurysm. Otherwise he is doing well and his most recent lab work is listed below: Appointment on 05/20/2014  Component Date Value Ref Range Status  . Sodium 05/20/2014 138  135 - 145 mEq/L Final  . Potassium 05/20/2014 3.9  3.5 - 5.3 mEq/L Final  . Chloride 05/20/2014 102  96 - 112 mEq/L Final  . CO2 05/20/2014 24  19 - 32 mEq/L Final  . Glucose, Bld 05/20/2014 134* 70 - 99 mg/dL Final  . BUN 05/20/2014 12  6 - 23 mg/dL Final  . Creat 05/20/2014 1.15  0.50 - 1.35 mg/dL Final  . Total Bilirubin 05/20/2014 0.5  0.2 - 1.2 mg/dL Final  . Alkaline Phosphatase 05/20/2014 61  39 - 117 U/L Final  . AST 05/20/2014 14  0 - 37 U/L Final  . ALT 05/20/2014 11  0 - 53 U/L Final  . Total Protein 05/20/2014 6.0  6.0 - 8.3 g/dL Final  . Albumin 05/20/2014 3.8  3.5 - 5.2 g/dL Final  . Calcium 05/20/2014 9.0  8.4 - 10.5 mg/dL Final  . GFR, Est African American 05/20/2014 72   Final  . GFR, Est Non African American 05/20/2014 62   Final   Comment:   The estimated GFR is a calculation valid for adults (>=28 years old) that uses the CKD-EPI algorithm to adjust for age and sex. It is   not to be used for children, pregnant women, hospitalized patients,    patients on dialysis, or with rapidly changing kidney function. According to the NKDEP, eGFR >89 is normal, 60-89 shows mild impairment, 30-59 shows  moderate impairment, 15-29 shows severe impairment and <15 is ESRD.     Marland Kitchen Hgb A1c MFr Bld 05/20/2014 6.2* <5.7 % Final   Comment:                                                                        According to the ADA Clinical Practice Recommendations for 2011, when HbA1c is used as a screening test:     >=6.5%   Diagnostic of Diabetes Mellitus            (if abnormal result is confirmed)   5.7-6.4%   Increased risk of developing Diabetes Mellitus   References:Diagnosis and Classification of Diabetes Mellitus,Diabetes QJJH,4174,08(XKGYJ 1):S62-S69 and Standards of Medical Care in         Diabetes - 2011,Diabetes EHUD,1497,02 (Suppl 1):S11-S61.     . Mean Plasma Glucose 05/20/2014 131* <117 mg/dL Final  . WBC 05/20/2014 5.3  4.0 - 10.5 K/uL Final  . RBC 05/20/2014  4.18* 4.22 - 5.81 MIL/uL Final  . Hemoglobin 05/20/2014 12.6* 13.0 - 17.0 g/dL Final  . HCT 05/20/2014 38.6* 39.0 - 52.0 % Final  . MCV 05/20/2014 92.3  78.0 - 100.0 fL Final  . MCH 05/20/2014 30.1  26.0 - 34.0 pg Final  . MCHC 05/20/2014 32.6  30.0 - 36.0 g/dL Final  . RDW 05/20/2014 14.1  11.5 - 15.5 % Final  . Platelets 05/20/2014 146* 150 - 400 K/uL Final  . MPV 05/20/2014 9.0  8.6 - 12.4 fL Final  . Neutrophils Relative % 05/20/2014 61  43 - 77 % Final  . Neutro Abs 05/20/2014 3.2  1.7 - 7.7 K/uL Final  . Lymphocytes Relative 05/20/2014 28  12 - 46 % Final  . Lymphs Abs 05/20/2014 1.5  0.7 - 4.0 K/uL Final  . Monocytes Relative 05/20/2014 8  3 - 12 % Final  . Monocytes Absolute 05/20/2014 0.4  0.1 - 1.0 K/uL Final  . Eosinophils Relative 05/20/2014 3  0 - 5 % Final  . Eosinophils Absolute 05/20/2014 0.2  0.0 - 0.7 K/uL Final  . Basophils Relative 05/20/2014 0  0 - 1 % Final  . Basophils Absolute 05/20/2014 0.0  0.0 - 0.1 K/uL Final  . Smear Review 05/20/2014 Criteria for review not met   Final  . Cholesterol 05/20/2014 125  0 - 200 mg/dL Final   Comment: ATP III Classification:       < 200        mg/dL         Desirable      200 - 239     mg/dL        Borderline High      >= 240        mg/dL        High     . Triglycerides 05/20/2014 104  <150 mg/dL Final  . HDL 05/20/2014 38* >=40 mg/dL Final   ** Please note change in reference range(s). **  . Total CHOL/HDL Ratio 05/20/2014 3.3   Final  . VLDL 05/20/2014 21  0 - 40 mg/dL Final  . LDL Cholesterol 05/20/2014 66  0 - 99 mg/dL Final   Comment:   Total Cholesterol/HDL Ratio:CHD Risk                        Coronary Heart Disease Risk Table                                        Men       Women          1/2 Average Risk              3.4        3.3              Average Risk              5.0        4.4           2X Average Risk              9.6        7.1           3X Average Risk             23.4       11.0 Use the  calculated Patient Ratio above and the CHD Risk table  to determine the patient's CHD Risk. ATP III Classification (LDL):       < 100        mg/dL         Optimal      100 - 129     mg/dL         Near or Above Optimal      130 - 159     mg/dL         Borderline High      160 - 189     mg/dL         High       > 190        mg/dL         Very High      Past Medical History  Diagnosis Date  . AAA (abdominal aortic aneurysm) 8/12    3.5cm  . CAD (coronary artery disease)   . Colon polyps   . Diabetes mellitus without complication   . Hyperlipidemia   . Hypertension   . Neuromuscular disorder     L2-3,L5-S1 bulging disc /nerve impingement  . 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 Tonsillectomy    . Coronary angioplasty      1989  . Colonoscopy N/A 09/27/2012    Procedure: COLONOSCOPY;  Surgeon: Daneil Dolin, MD;  [facility];  Service: Endoscopy;  Laterality: N/A;  9:30   Current Outpatient Prescriptions on File Prior to Visit  Medication Sig Dispense Refill  . allopurinol (ZYLOPRIM) 300 MG tablet TAKE 1 TABLET EVERY DAY 90 tablet 3  .  aspirin 325 MG EC tablet Take 325 mg by mouth daily.    Marland Kitchen atorvastatin (LIPITOR) 20 MG tablet Take 1 tablet (20 mg total) by mouth daily. 90 tablet 3  . diltiazem (CARDIZEM CD) 240 MG 24 hr capsule Take 1 capsule (240 mg total) by mouth daily. 90 capsule 4  . glipiZIDE (GLUCOTROL) 5 MG tablet TAKE 1 TABLET TWO TIMES DAILY BEFORE A MEAL (Patient taking differently: TAKE 1 TABLET ONCE DAILY BEFORE A MEAL) 180 tablet 3  . glucose blood (TRUETEST TEST) test strip 1 each by Other route as needed for other. Check bs bid  #200 / 11 refills    . lisinopril (PRINIVIL,ZESTRIL) 40 MG tablet TAKE 1 TABLET EVERY DAY 90 tablet 3  . metFORMIN (GLUCOPHAGE) 500 MG tablet Take 1 tablet (500 mg total) by mouth 2 (two) times daily with a meal. 180 tablet 3  . metoprolol succinate (TOPROL-XL) 25 MG 24 hr tablet Take 1 tablet (25 mg total) by mouth daily. 90 tablet 3  . Multiple Vitamin (MULTIVITAMIN WITH MINERALS) TABS Take 1 tablet by mouth daily.    . TRUEPLUS LANCETS 28G MISC Use as directed to monitor FSBS. DX: 250.00 210 each 3   No current facility-administered medications on file prior to visit.   No Known Allergies History   Social History  . Marital Status: Married    Spouse Name: N/A  . Number of Children: N/A  . Years of Education: N/A   Occupational History  . retired     Designer, television/film set    Social History Main Topics  . Smoking status: Former Smoker -- 35 years    Types: Cigarettes    Quit date: 01/02/1988  . Smokeless tobacco: Never Used  . Alcohol Use: Yes  Comment: rare beer, no history of ETOH abuse  . Drug Use: No  . Sexual Activity: Not on file   Other Topics Concern  . Not on file   Social History Narrative   Family History  Problem Relation Age of Onset  . Diabetes Mother   . Heart disease Mother     After age 32  . Heart attack Mother   . Hypertension Mother   . Diabetes Father   . Heart disease Father     After age 76  . Hypertension Father   . Heart attack Father     . Colon cancer Neg Hx   . Brain cancer Sister   . Cancer Sister     Brain  . Diabetes Sister   . Hypertension Sister   . Cancer Brother     Chest Tumor  . Diabetes Brother   . Hypertension Brother   . Deep vein thrombosis Sister   . Diabetes Sister   . Diabetes Brother     Bilateral leg      Review of Systems  All other systems reviewed and are negative.      Objective:   Physical Exam  Constitutional: He is oriented to person, place, and time. He appears well-developed and well-nourished. No distress.  HENT:  Head: Normocephalic and atraumatic.  Right Ear: External ear normal.  Left Ear: External ear normal.  Nose: Nose normal.  Mouth/Throat: Oropharynx is clear and moist. No oropharyngeal exudate.  Eyes: Conjunctivae are normal. Pupils are equal, round, and reactive to light. Right eye exhibits no discharge. Left eye exhibits no discharge. No scleral icterus.  Neck: Normal range of motion. Neck supple. No JVD present. No tracheal deviation present. No thyromegaly present.  Cardiovascular: Normal rate, regular rhythm, normal heart sounds and intact distal pulses.  Exam reveals no gallop and no friction rub.   No murmur heard. Pulmonary/Chest: Effort normal and breath sounds normal. No stridor. No respiratory distress. He has no wheezes. He has no rales. He exhibits no tenderness.  Abdominal: Soft. Bowel sounds are normal. He exhibits no distension and no mass. There is no tenderness. There is no rebound and no guarding.  Musculoskeletal: Normal range of motion. He exhibits no edema or tenderness.  Lymphadenopathy:    He has no cervical adenopathy.  Neurological: He is alert and oriented to person, place, and time. He has normal reflexes. He displays normal reflexes. No cranial nerve deficit. He exhibits normal muscle tone. Coordination normal.  Skin: Skin is warm. No rash noted. He is not diaphoretic. No erythema. No pallor.  Psychiatric: He has a normal mood and affect.  His behavior is normal. Judgment and thought content normal.  Vitals reviewed.         Assessment & Plan:  Routine general medical examination at a health care facility  Patient's physical exam is normal. His lab work is excellent. His blood pressure is excellent. Patient's AAA screening is up-to-date. His colonoscopy is up-to-date. His immunizations are up-to-date. I have recommended that the patient have his prostate checked here in September when he comes back for six-month check. At that time we can recheck a PSA as well as perform a digital rectal exam. Diabetic eye exam is up-to-date. Diabetic foot exam is performed today. He does have some slightly diminished pulses in his left foot particularly the posterior tibialis. However he has normal sensation in the feet

## 2014-05-29 ENCOUNTER — Telehealth: Payer: Self-pay | Admitting: Family Medicine

## 2014-05-29 NOTE — Telephone Encounter (Signed)
Patient would like a call back from you asap regarding j

## 2014-05-29 NOTE — Telephone Encounter (Signed)
Pt wife calling stating that pt had gotten up this am and had pain in his shoulder and neck, and when he raises his head or stands up he vomits, wife has put ice pack on his shoulder for the pain for 57mins and stated it has helped but now is coming back, wife said that he had same symptoms back in Dec last year and wants to know what can he do. Please advise!  [facility] [place]

## 2014-05-29 NOTE — Telephone Encounter (Signed)
Called and I will see him at 10 on Monday.

## 2014-06-01 ENCOUNTER — Encounter: Payer: Self-pay | Admitting: Family Medicine

## 2014-06-01 ENCOUNTER — Ambulatory Visit (INDEPENDENT_AMBULATORY_CARE_PROVIDER_SITE_OTHER): Payer: Commercial Managed Care - HMO | Admitting: Family Medicine

## 2014-06-01 VITALS — BP 140/60 | HR 76 | Temp 97.7°F | Resp 18 | Ht 70.5 in | Wt 217.0 lb

## 2014-06-01 DIAGNOSIS — S161XXA Strain of muscle, fascia and tendon at neck level, initial encounter: Secondary | ICD-10-CM | POA: Diagnosis not present

## 2014-06-01 DIAGNOSIS — H8110 Benign paroxysmal vertigo, unspecified ear: Secondary | ICD-10-CM | POA: Diagnosis not present

## 2014-06-01 NOTE — Progress Notes (Signed)
Subjective:    Patient ID: Juan [name], male    DOB: [birth year], 74 y.o.   MRN: [id number]  HPI  Patient reports episodic pain in his neck that radiates into both shoulders and down into his middle of his back. It seems to relate to position. It also seems to be associated with poor posture in his head and his neck. He does report stiffness and pain in the trapezius muscle bilaterally. There are no palpable muscle spasms but he is tender to palpation along the lateral borders of the trapezius and the lower edges of the trapezius in the thoracic spine. There is no tenderness to palpation over the spinous processes in the cervical spine or upper thoracic spine. He has normal strength in both arms and normal reflexes. He denies any numbness or tingling or weakness in his arms. He also reports occasional nausea and vertigo. At least once a year, he will stand up to get out of bed, he will feel very dizzy like the room is moving and he would become nauseated and feel like he needs to vomit. This usually last 2-3 days and then resolve spontaneously. At present he is completely asymptomatic. He denies any vision changes or other neurologic deficits. He denies any hearing loss or headaches. Today he is completely asymptomatic Past Medical History  Diagnosis Date  . AAA (abdominal aortic aneurysm) 8/12    3.5cm  . CAD (coronary artery disease)   . Colon polyps   . Diabetes mellitus without complication   . Hyperlipidemia   . Hypertension   . Neuromuscular disorder     L2-3,L5-S1 bulging disc /nerve impingement  . 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 Tonsillectomy    . Coronary angioplasty      1989  . Colonoscopy N/A 09/27/2012    Procedure: COLONOSCOPY;  Surgeon: Daneil Dolin, MD;  Location: [facility];  Service: Endoscopy;  Laterality: N/A;  9:30   Current Outpatient Prescriptions on File Prior to Visit  Medication Sig  Dispense Refill  . allopurinol (ZYLOPRIM) 300 MG tablet TAKE 1 TABLET EVERY DAY 90 tablet 3  . aspirin 325 MG EC tablet Take 325 mg by mouth daily.    Marland Kitchen atorvastatin (LIPITOR) 20 MG tablet Take 1 tablet (20 mg total) by mouth daily. 90 tablet 3  . diltiazem (CARDIZEM CD) 240 MG 24 hr capsule Take 1 capsule (240 mg total) by mouth daily. 90 capsule 4  . glipiZIDE (GLUCOTROL) 5 MG tablet TAKE 1 TABLET TWO TIMES DAILY BEFORE A MEAL (Patient taking differently: TAKE 1 TABLET ONCE DAILY BEFORE A MEAL) 180 tablet 3  . glucose blood (TRUETEST TEST) test strip 1 each by Other route as needed for other. Check bs bid  #200 / 11 refills    . lisinopril (PRINIVIL,ZESTRIL) 40 MG tablet TAKE 1 TABLET EVERY DAY 90 tablet 3  . metFORMIN (GLUCOPHAGE) 500 MG tablet Take 1 tablet (500 mg total) by mouth 2 (two) times daily with a meal. 180 tablet 3  . metoprolol succinate (TOPROL-XL) 25 MG 24 hr tablet Take 1 tablet (25 mg total) by mouth daily. 90 tablet 3  . Multiple Vitamin (MULTIVITAMIN WITH MINERALS) TABS Take 1 tablet by mouth daily.    . TRUEPLUS LANCETS 28G MISC Use as directed to monitor FSBS. DX: 250.00 210 each 3   No current facility-administered medications on file prior to visit.   No Known Allergies History  Social History  . Marital Status: Married    Spouse Name: N/A  . Number of Children: N/A  . Years of Education: N/A   Occupational History  . retired     Designer, television/film set    Social History Main Topics  . Smoking status: Former Smoker -- 35 years    Types: Cigarettes    Quit date: 01/02/1988  . Smokeless tobacco: Never Used  . Alcohol Use: Yes     Comment: rare beer, no history of ETOH abuse  . Drug Use: No  . Sexual Activity: Not on file   Other Topics Concern  . Not on file   Social History Narrative     Review of Systems  All other systems reviewed and are negative.      Objective:   Physical Exam  Constitutional: He is oriented to person, place, and time.    Cardiovascular: Normal rate, regular rhythm and normal heart sounds.   Pulmonary/Chest: Effort normal and breath sounds normal. No respiratory distress. He has no wheezes. He has no rales.  Musculoskeletal:       Cervical back: He exhibits tenderness and pain. He exhibits normal range of motion, no bony tenderness, no swelling, no edema, no deformity and no spasm.  Neurological: He is alert and oriented to person, place, and time. He has normal strength. He displays no atrophy and no tremor. No cranial nerve deficit or sensory deficit. He exhibits normal muscle tone. He displays a negative Romberg sign.  Reflex Scores:      Tricep reflexes are 2+ on the right side and 2+ on the left side.      Bicep reflexes are 2+ on the right side and 2+ on the left side.      Brachioradialis reflexes are 2+ on the right side and 2+ on the left side. Vitals reviewed.         Assessment & Plan:  Cervical strain, initial encounter - Plan: DG Cervical Spine Complete  Benign paroxysmal positional vertigo, unspecified laterality  Based on his physical exam, I believe he is having pain and tenderness in his trapezius muscle from likely a cervical strain. I recommended proceeding with a cervical spine x-ray to rule out underlying skeletal pathology and if normal, I will treat the cervical strain with muscle relaxers on an as-needed basis. The patient states that he can live with the pain. It is only very mild and it does seem to respond to position and better posture. Therefore he would like to treat the pain conservatively if the x-rays are normal. I believe the patient's dizziness and nausea is likely vertigo. At the present time he is completely asymptomatic and his exam is completely normal. Therefore I reassured the patient I thought that this is vertigo based on his history. If symptoms worsen or return week and certainly perform imaging of the brain however he has no concerning features for an acoustic  neuroma.

## 2014-06-05 ENCOUNTER — Ambulatory Visit
Admission: RE | Admit: 2014-06-05 | Discharge: 2014-06-05 | Disposition: A | Discharge status: Home / Self Care | Payer: Commercial Managed Care - HMO | Source: Ambulatory Visit | Attending: Family Medicine | Admitting: Family Medicine

## 2014-06-05 DIAGNOSIS — S161XXA Strain of muscle, fascia and tendon at neck level, initial encounter: Secondary | ICD-10-CM

## 2014-06-05 DIAGNOSIS — M4312 Spondylolisthesis, cervical region: Secondary | ICD-10-CM | POA: Diagnosis not present

## 2014-06-05 DIAGNOSIS — M5030 Other cervical disc degeneration, unspecified cervical region: Secondary | ICD-10-CM | POA: Diagnosis not present

## 2014-06-08 ENCOUNTER — Encounter: Payer: Self-pay | Admitting: Family Medicine

## 2014-11-30 ENCOUNTER — Other Ambulatory Visit: Payer: Self-pay | Admitting: Family Medicine

## 2014-11-30 ENCOUNTER — Other Ambulatory Visit: Payer: Commercial Managed Care - HMO

## 2014-11-30 DIAGNOSIS — E119 Type 2 diabetes mellitus without complications: Secondary | ICD-10-CM | POA: Diagnosis not present

## 2014-11-30 DIAGNOSIS — Z125 Encounter for screening for malignant neoplasm of prostate: Secondary | ICD-10-CM | POA: Diagnosis not present

## 2014-11-30 DIAGNOSIS — E785 Hyperlipidemia, unspecified: Secondary | ICD-10-CM

## 2014-11-30 LAB — COMPLETE METABOLIC PANEL WITH GFR
ALT: 14 U/L (ref 9–46)
AST: 15 U/L (ref 10–35)
Albumin: 4 g/dL (ref 3.6–5.1)
Alkaline Phosphatase: 59 U/L (ref 40–115)
BILIRUBIN TOTAL: 0.5 mg/dL (ref 0.2–1.2)
BUN: 13 mg/dL (ref 7–25)
CO2: 28 mmol/L (ref 20–31)
Calcium: 9.3 mg/dL (ref 8.6–10.3)
Chloride: 103 mmol/L (ref 98–110)
Creat: 1.18 mg/dL (ref 0.70–1.18)
GFR, EST AFRICAN AMERICAN: 69 mL/min (ref >=60)
GFR, Est Non African American: 60 mL/min (ref >=60)
GLUCOSE: 111 mg/dL — AB (ref 70–99)
Potassium: 4.2 mmol/L (ref 3.5–5.3)
SODIUM: 141 mmol/L (ref 135–146)
TOTAL PROTEIN: 6.1 g/dL (ref 6.1–8.1)

## 2014-11-30 LAB — CBC WITH DIFFERENTIAL/PLATELET
Basophils Absolute: 0 10*3/uL (ref 0.0–0.1)
Basophils Relative: 0 % (ref 0–1)
Eosinophils Absolute: 0.2 10*3/uL (ref 0.0–0.7)
Eosinophils Relative: 3 % (ref 0–5)
HEMATOCRIT: 38.4 % — AB (ref 39.0–52.0)
HEMOGLOBIN: 13.1 g/dL (ref 13.0–17.0)
LYMPHS ABS: 1.5 10*3/uL (ref 0.7–4.0)
LYMPHS PCT: 27 % (ref 12–46)
MCH: 31 pg (ref 26.0–34.0)
MCHC: 34.1 g/dL (ref 30.0–36.0)
MCV: 90.8 fL (ref 78.0–100.0)
MONOS PCT: 7 % (ref 3–12)
MPV: 9.2 fL (ref 8.6–12.4)
Monocytes Absolute: 0.4 10*3/uL (ref 0.1–1.0)
NEUTROS ABS: 3.5 10*3/uL (ref 1.7–7.7)
Neutrophils Relative %: 63 % (ref 43–77)
Platelets: 145 10*3/uL — ABNORMAL LOW (ref 150–400)
RBC: 4.23 MIL/uL (ref 4.22–5.81)
RDW: 14.4 % (ref 11.5–15.5)
WBC: 5.6 10*3/uL (ref 4.0–10.5)

## 2014-11-30 LAB — LIPID PANEL
CHOLESTEROL: 147 mg/dL (ref 125–200)
HDL: 43 mg/dL (ref >=40)
LDL Cholesterol: 84 mg/dL (ref <130)
TRIGLYCERIDES: 100 mg/dL (ref <150)
Total CHOL/HDL Ratio: 3.4 Ratio (ref <=5.0)
VLDL: 20 mg/dL (ref <30)

## 2014-11-30 LAB — HEMOGLOBIN A1C
Hgb A1c MFr Bld: 6.2 % — ABNORMAL HIGH (ref <5.7)
Mean Plasma Glucose: 131 mg/dL — ABNORMAL HIGH (ref <117)

## 2014-12-03 ENCOUNTER — Encounter: Payer: Self-pay | Admitting: Family Medicine

## 2014-12-03 ENCOUNTER — Ambulatory Visit (INDEPENDENT_AMBULATORY_CARE_PROVIDER_SITE_OTHER): Payer: Commercial Managed Care - HMO | Admitting: Family Medicine

## 2014-12-03 VITALS — BP 142/70 | HR 86 | Temp 98.5°F | Resp 16 | Ht 70.5 in | Wt 213.0 lb

## 2014-12-03 DIAGNOSIS — E1159 Type 2 diabetes mellitus with other circulatory complications: Secondary | ICD-10-CM

## 2014-12-03 DIAGNOSIS — E785 Hyperlipidemia, unspecified: Secondary | ICD-10-CM

## 2014-12-03 DIAGNOSIS — Z23 Encounter for immunization: Secondary | ICD-10-CM

## 2014-12-03 DIAGNOSIS — E1151 Type 2 diabetes mellitus with diabetic peripheral angiopathy without gangrene: Secondary | ICD-10-CM

## 2014-12-03 DIAGNOSIS — Z125 Encounter for screening for malignant neoplasm of prostate: Secondary | ICD-10-CM | POA: Diagnosis not present

## 2014-12-03 DIAGNOSIS — I10 Essential (primary) hypertension: Secondary | ICD-10-CM | POA: Diagnosis not present

## 2014-12-03 DIAGNOSIS — I739 Peripheral vascular disease, unspecified: Secondary | ICD-10-CM | POA: Insufficient documentation

## 2014-12-03 DIAGNOSIS — I2510 Atherosclerotic heart disease of native coronary artery without angina pectoris: Secondary | ICD-10-CM | POA: Diagnosis not present

## 2014-12-03 MED ORDER — METOPROLOL SUCCINATE ER 25 MG PO TB24: 50.0000 mg | ORAL_TABLET | Freq: Every day | ORAL | Status: DC

## 2014-12-03 NOTE — Progress Notes (Signed)
Subjective:    Patient ID: Juan [name], male    DOB: 02-01-1941, 74 y.o.   MRN: [id number]  HPI 05/2014 Patient is here today for complete physical exam. He has no concerns. His last colonoscopy was 2014. They did find several tubular adenomas. He is due again in 2019. His last prostate exam was in July of last year and is not due until July of this year. Prevnar 13, Pneumovax 23, and the shingles vaccine are all up-to-date. We did discuss tetanus shot in the patient does not want a tetanus shot today. Patient's AAA was recently evaluated in February and there was no significant change in the size of his aneurysm. At that time, my plan was: Patient's physical exam is normal. His lab work is excellent. His blood pressure is excellent. Patient's AAA screening is up-to-date. His colonoscopy is up-to-date. His immunizations are up-to-date. I have recommended that the patient have his prostate checked here in September when he comes back for six-month check. At that time we can recheck a PSA as well as perform a digital rectal exam. Diabetic eye exam is up-to-date. Diabetic foot exam is performed today. He does have some slightly diminished pulses in his left foot particularly the posterior tibialis. However he has normal sensation in the feet  12/03/14 He is here today for follow up.  Hemoglobin A1c is outstanding at 6.2. However his blood pressure slightly elevated at 142/70. He states his blood pressure tends to range between 135 and 546 at home systolic. He has a history of a AAA as well as peripheral artery disease with less than 50% blockages in the iliac arteries bilaterally. He is taking an aspirin. He is quit smoking many many years ago. His LDL cholesterol is slightly higher than goal at 84. He is due for a PSA today. He is also due for his flu shot. Appointment on 11/30/2014  Component Date Value Ref Range Status  . Sodium 11/30/2014 141  135 - 146 mmol/L Final  . Potassium 11/30/2014 4.2   3.5 - 5.3 mmol/L Final  . Chloride 11/30/2014 103  98 - 110 mmol/L Final  . CO2 11/30/2014 28  20 - 31 mmol/L Final  . Glucose, Bld 11/30/2014 111* 70 - 99 mg/dL Final  . BUN 11/30/2014 13  7 - 25 mg/dL Final  . Creat 11/30/2014 1.18  0.70 - 1.18 mg/dL Final  . Total Bilirubin 11/30/2014 0.5  0.2 - 1.2 mg/dL Final  . Alkaline Phosphatase 11/30/2014 59  40 - 115 U/L Final  . AST 11/30/2014 15  10 - 35 U/L Final  . ALT 11/30/2014 14  9 - 46 U/L Final  . Total Protein 11/30/2014 6.1  6.1 - 8.1 g/dL Final  . Albumin 11/30/2014 4.0  3.6 - 5.1 g/dL Final  . Calcium 11/30/2014 9.3  8.6 - 10.3 mg/dL Final  . GFR, Est African American 11/30/2014 69  >=60 mL/min Final  . GFR, Est Non African American 11/30/2014 60  >=60 mL/min Final   Comment:   The estimated GFR is a calculation valid for adults (>=45 years old) that uses the CKD-EPI algorithm to adjust for age and sex. It is   not to be used for children, pregnant women, hospitalized patients,    patients on dialysis, or with rapidly changing kidney function. According to the NKDEP, eGFR >89 is normal, 60-89 shows mild impairment, 30-59 shows moderate impairment, 15-29 shows severe impairment and <15 is ESRD.     Marland Kitchen Hgb A1c MFr  Bld 11/30/2014 6.2* <5.7 % Final   Comment:                                                                        According to the ADA Clinical Practice Recommendations for 2011, when HbA1c is used as a screening test:     >=6.5%   Diagnostic of Diabetes Mellitus            (if abnormal result is confirmed)   5.7-6.4%   Increased risk of developing Diabetes Mellitus   References:Diagnosis and Classification of Diabetes Mellitus,Diabetes AJOI,7867,67(MCNOB 1):S62-S69 and Standards of Medical Care in         Diabetes - 2011,Diabetes SJGG,8366,29 (Suppl 1):S11-S61.     . Mean Plasma Glucose 11/30/2014 131* <117 mg/dL Final   Comment:   Footnotes:  (1) ** Please note change in unit of measure and reference  range(s). **     . WBC 11/30/2014 5.6  4.0 - 10.5 K/uL Final  . RBC 11/30/2014 4.23  4.22 - 5.81 MIL/uL Final  . Hemoglobin 11/30/2014 13.1  13.0 - 17.0 g/dL Final  . HCT 11/30/2014 38.4* 39.0 - 52.0 % Final  . MCV 11/30/2014 90.8  78.0 - 100.0 fL Final  . MCH 11/30/2014 31.0  26.0 - 34.0 pg Final  . MCHC 11/30/2014 34.1  30.0 - 36.0 g/dL Final  . RDW 11/30/2014 14.4  11.5 - 15.5 % Final  . Platelets 11/30/2014 145* 150 - 400 K/uL Final  . MPV 11/30/2014 9.2  8.6 - 12.4 fL Final  . Neutrophils Relative % 11/30/2014 63  43 - 77 % Final  . Neutro Abs 11/30/2014 3.5  1.7 - 7.7 K/uL Final  . Lymphocytes Relative 11/30/2014 27  12 - 46 % Final  . Lymphs Abs 11/30/2014 1.5  0.7 - 4.0 K/uL Final  . Monocytes Relative 11/30/2014 7  3 - 12 % Final  . Monocytes Absolute 11/30/2014 0.4  0.1 - 1.0 K/uL Final  . Eosinophils Relative 11/30/2014 3  0 - 5 % Final  . Eosinophils Absolute 11/30/2014 0.2  0.0 - 0.7 K/uL Final  . Basophils Relative 11/30/2014 0  0 - 1 % Final  . Basophils Absolute 11/30/2014 0.0  0.0 - 0.1 K/uL Final  . Smear Review 11/30/2014 Criteria for review not met   Final  . Cholesterol 11/30/2014 147  125 - 200 mg/dL Final  . Triglycerides 11/30/2014 100  <150 mg/dL Final  . HDL 11/30/2014 43  >=40 mg/dL Final  . Total CHOL/HDL Ratio 11/30/2014 3.4  <=5.0 Ratio Final  . VLDL 11/30/2014 20  <30 mg/dL Final  . LDL Cholesterol 11/30/2014 84  <130 mg/dL Final   Comment:   Total Cholesterol/HDL Ratio:CHD Risk                        Coronary Heart Disease Risk Table                                        Men       Women          1/2 Average Risk  3.4        3.3              Average Risk              5.0        4.4           2X Average Risk              9.6        7.1           3X Average Risk             23.4       11.0 Use the calculated Patient Ratio above and the CHD Risk table  to determine the patient's CHD Risk.    Past Medical History  Diagnosis Date  .  AAA (abdominal aortic aneurysm) 8/12    3.5cm  . CAD (coronary artery disease)   . Colon polyps   . Diabetes mellitus without complication   . Hyperlipidemia   . Hypertension   . Neuromuscular disorder     L2-3,L5-S1 bulging disc /nerve impingement  . 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 Tonsillectomy    . Coronary angioplasty      1989  . Colonoscopy N/A 09/27/2012    Procedure: COLONOSCOPY;  Surgeon: Daneil Dolin, MD;  Location: [facility];  Service: Endoscopy;  Laterality: N/A;  9:30   Current Outpatient Prescriptions on File Prior to Visit  Medication Sig Dispense Refill  . allopurinol (ZYLOPRIM) 300 MG tablet TAKE 1 TABLET EVERY DAY 90 tablet 3  . aspirin 325 MG EC tablet Take 325 mg by mouth daily.    Marland Kitchen atorvastatin (LIPITOR) 20 MG tablet Take 1 tablet (20 mg total) by mouth daily. 90 tablet 3  . diltiazem (CARDIZEM CD) 240 MG 24 hr capsule Take 1 capsule (240 mg total) by mouth daily. 90 capsule 4  . glipiZIDE (GLUCOTROL) 5 MG tablet TAKE 1 TABLET TWO TIMES DAILY BEFORE A MEAL (Patient taking differently: TAKE 1 TABLET ONCE DAILY BEFORE A MEAL) 180 tablet 3  . glucose blood (TRUETEST TEST) test strip 1 each by Other route as needed for other. Check bs bid  #200 / 11 refills    . lisinopril (PRINIVIL,ZESTRIL) 40 MG tablet TAKE 1 TABLET EVERY DAY 90 tablet 3  . metFORMIN (GLUCOPHAGE) 500 MG tablet Take 1 tablet (500 mg total) by mouth 2 (two) times daily with a meal. 180 tablet 3  . metoprolol succinate (TOPROL-XL) 25 MG 24 hr tablet Take 1 tablet (25 mg total) by mouth daily. 90 tablet 3  . Multiple Vitamin (MULTIVITAMIN WITH MINERALS) TABS Take 1 tablet by mouth daily.    . TRUEPLUS LANCETS 28G MISC Use as directed to monitor FSBS. DX: 250.00 210 each 3   No current facility-administered medications on file prior to visit.   No Known Allergies Social History   Social History  . Marital Status: Married      Spouse Name: N/A  . Number of Children: N/A  . Years of Education: N/A   Occupational History  . retired     Designer, television/film set    Social History Main Topics  . Smoking status: Former Smoker -- 35 years    Types: Cigarettes    Quit date: 01/02/1988  . Smokeless tobacco: Never Used  . Alcohol Use: Yes     Comment: rare beer, no history of  ETOH abuse  . Drug Use: No  . Sexual Activity: Not on file   Other Topics Concern  . Not on file   Social History Narrative   Family History  Problem Relation Age of Onset  . Diabetes Mother   . Heart disease Mother     After age 36  . Heart attack Mother   . Hypertension Mother   . Diabetes Father   . Heart disease Father     After age 40  . Hypertension Father   . Heart attack Father   . Colon cancer Neg Hx   . Brain cancer Sister   . Cancer Sister     Brain  . Diabetes Sister   . Hypertension Sister   . Cancer Brother     Chest Tumor  . Diabetes Brother   . Hypertension Brother   . Deep vein thrombosis Sister   . Diabetes Sister   . Diabetes Brother     Bilateral leg      Review of Systems  All other systems reviewed and are negative.      Objective:   Physical Exam  Constitutional: He is oriented to person, place, and time. He appears well-developed and well-nourished. No distress.  HENT:  Head: Normocephalic and atraumatic.  Right Ear: External ear normal.  Left Ear: External ear normal.  Nose: Nose normal.  Mouth/Throat: Oropharynx is clear and moist. No oropharyngeal exudate.  Eyes: Conjunctivae are normal. Pupils are equal, round, and reactive to light. Right eye exhibits no discharge. Left eye exhibits no discharge. No scleral icterus.  Neck: Normal range of motion. Neck supple. No JVD present. No tracheal deviation present. No thyromegaly present.  Cardiovascular: Normal rate, regular rhythm, normal heart sounds and intact distal pulses.  Exam reveals no gallop and no friction rub.   No murmur  heard. Pulmonary/Chest: Effort normal and breath sounds normal. No stridor. No respiratory distress. He has no wheezes. He has no rales. He exhibits no tenderness.  Abdominal: Soft. Bowel sounds are normal. He exhibits no distension and no mass. There is no tenderness. There is no rebound and no guarding.  Musculoskeletal: Normal range of motion. He exhibits no edema or tenderness.  Lymphadenopathy:    He has no cervical adenopathy.  Neurological: He is alert and oriented to person, place, and time. He has normal reflexes. No cranial nerve deficit. He exhibits normal muscle tone. Coordination normal.  Skin: Skin is warm. No rash noted. He is not diaphoretic. No erythema. No pallor.  Psychiatric: He has a normal mood and affect. His behavior is normal. Judgment and thought content normal.  Vitals reviewed.         Assessment & Plan:  Benign essential HTN - Plan: metoprolol succinate (TOPROL-XL) 25 MG 24 hr tablet  Prostate cancer screening - Plan: PSA, Medicare  DM (diabetes mellitus) type II controlled peripheral vascular disorder  HLD (hyperlipidemia)  ASCVD (arteriosclerotic cardiovascular disease)  Blood pressure is high given his AAA. I will increase metoprolol XL to 50 mg by mouth daily. He is due for PSA and I will add that to his blood work. His diabetes is well controlled on make no changes in his medication at this time. However given his history of peripheral vascular disease, ASCVD, and coronary artery disease, his goal LDL cholesterol is less than 70. Patient would like to work on diet and exercise and recheck fasting lipid panel in 3 months. If LDL cholesterol is still greater than 70, I would increase  Lipitor to 80 mg a day

## 2014-12-03 NOTE — Addendum Note (Signed)
Addended by: Haskel Khan A on: 12/03/2014 03:00 PM   Modules accepted: Orders

## 2014-12-03 NOTE — Addendum Note (Signed)
Addended by: Shary Decamp B on: 12/03/2014 02:57 PM   Modules accepted: Orders

## 2014-12-04 ENCOUNTER — Encounter: Payer: Self-pay | Admitting: Family Medicine

## 2014-12-04 LAB — PSA: PSA: 0.73 ng/mL (ref <=4.00)

## 2015-04-14 ENCOUNTER — Encounter: Payer: Self-pay | Admitting: Family Medicine

## 2015-04-14 ENCOUNTER — Other Ambulatory Visit: Payer: Self-pay | Admitting: Family Medicine

## 2015-04-20 ENCOUNTER — Encounter: Payer: Self-pay | Admitting: Family

## 2015-04-23 ENCOUNTER — Telehealth: Payer: Self-pay | Admitting: *Deleted

## 2015-04-23 NOTE — Telephone Encounter (Signed)
Submitted [facility] referral thru [facility] for authorization to Dr Dolphus Jenny with authorization [phone]  Requesting provider: WarrenPickard,MD  Treating provider: Dolphus Jenny  Number of visits:6  Start Date: 04/27/15  End Date;10/24/15  Dx: [id number]- Abdominal aortic aneurysm,without rupture

## 2015-04-27 ENCOUNTER — Inpatient Hospital Stay (HOSPITAL_COMMUNITY): Admission: RE | Admit: 2015-04-27 | Payer: Commercial Managed Care - HMO | Source: Ambulatory Visit

## 2015-04-27 ENCOUNTER — Ambulatory Visit: Payer: Commercial Managed Care - HMO | Admitting: Family

## 2015-04-29 ENCOUNTER — Encounter: Payer: Self-pay | Admitting: Family Medicine

## 2015-04-30 MED ORDER — ATORVASTATIN CALCIUM 20 MG PO TABS: 20.0000 mg | ORAL_TABLET | Freq: Every day | ORAL | Status: DC

## 2015-05-04 ENCOUNTER — Encounter: Payer: Self-pay | Admitting: Family Medicine

## 2015-05-05 ENCOUNTER — Encounter: Payer: Self-pay | Admitting: Family Medicine

## 2015-05-05 ENCOUNTER — Ambulatory Visit (INDEPENDENT_AMBULATORY_CARE_PROVIDER_SITE_OTHER): Payer: Commercial Managed Care - HMO | Admitting: Family Medicine

## 2015-05-05 VITALS — BP 140/82 | HR 78 | Temp 98.7°F | Resp 16 | Ht 70.5 in | Wt 223.0 lb

## 2015-05-05 DIAGNOSIS — J069 Acute upper respiratory infection, unspecified: Secondary | ICD-10-CM

## 2015-05-05 MED ORDER — AZITHROMYCIN 250 MG PO TABS
ORAL_TABLET | ORAL | Status: DC
Start: 2015-05-05 — End: 2015-05-27

## 2015-05-05 MED ORDER — GUAIFENESIN-CODEINE 100-10 MG/5ML PO SOLN: 5.0000 mL | Freq: Four times a day (QID) | ORAL | Status: DC | PRN

## 2015-05-05 NOTE — Progress Notes (Signed)
Patient ID: Juan [name], male   DOB: [birth year], 75 y.o.   MRN: [id number]    Subjective:    Patient ID: Juan [name], male    DOB: [birth year], 75 y.o.   MRN: [id number]  Patient presents for Illness patient here with cough with production thick sputum and chest congestion for the past 3-4 days. Does not appear to be getting better. He's been taking Robitussin over-the-counter. He's not had any significant fever and he is a nonsmoker but the cough is keeping him up at night.    Review Of Systems:  GEN- denies fatigue, fever, weight loss,weakness, recent illness HEENT- denies eye drainage, change in vision, +nasal discharge, CVS- denies chest pain, palpitations RESP- denies SOB, +cough, wheeze ABD- denies N/V, change in stools, abd pain GU- denies dysuria, hematuria, dribbling, incontinence MSK- denies joint pain, muscle aches, injury Neuro- denies headache, dizziness, syncope, seizure activity       Objective:    BP 140/82 mmHg  Pulse 78  Temp(Src) 98.7 F (37.1 C) (Oral)  Resp 16  Ht 5' 10.5" (1.791 m)  Wt 223 lb (101.152 kg)  BMI 31.53 kg/m2  SpO2 98% GEN- NAD, alert and oriented x3 HEENT- PERRL, EOMI, non injected sclera, pink conjunctiva, MMM, oropharynx clear  TM clear bilat no effusion, no  maxillary sinus tenderness,  Clear  Nasal drainage  Neck- Supple, no LAD CVS- RRR, no murmur RESP-congestion bilat, no discrete rales, no wheeze, normal WOB  EXT- No edema Pulses- Radial 2+         Assessment & Plan:      Problem List Items Addressed This Visit    None    Visit Diagnoses    URI, acute    -  Primary    Based on age, worsening symptoms, higher risk for PNA, treat with ZPAK, robitussin Codiene    Relevant Medications    azithromycin (ZITHROMAX) 250 MG tablet       Note: This dictation was prepared with Dragon dictation along with smaller phrase technology. Any transcriptional errors that result from this process are unintentional.

## 2015-05-05 NOTE — Patient Instructions (Signed)
Take the cough medicine  Take antibiotics Nasal saline spray for nose  Call if not improved  F/U as needed

## 2015-05-24 ENCOUNTER — Other Ambulatory Visit: Payer: Commercial Managed Care - HMO

## 2015-05-24 ENCOUNTER — Encounter: Payer: Self-pay | Admitting: Family Medicine

## 2015-05-24 DIAGNOSIS — E785 Hyperlipidemia, unspecified: Secondary | ICD-10-CM | POA: Diagnosis not present

## 2015-05-24 DIAGNOSIS — E119 Type 2 diabetes mellitus without complications: Secondary | ICD-10-CM | POA: Diagnosis not present

## 2015-05-24 LAB — CBC WITH DIFFERENTIAL/PLATELET
BASOS ABS: 0 10*3/uL (ref 0.0–0.1)
BASOS PCT: 0 % (ref 0–1)
EOS ABS: 0.2 10*3/uL (ref 0.0–0.7)
EOS PCT: 3 % (ref 0–5)
HCT: 38.1 % — ABNORMAL LOW (ref 39.0–52.0)
Hemoglobin: 12.8 g/dL — ABNORMAL LOW (ref 13.0–17.0)
Lymphocytes Relative: 25 % (ref 12–46)
Lymphs Abs: 1.6 10*3/uL (ref 0.7–4.0)
MCH: 30.3 pg (ref 26.0–34.0)
MCHC: 33.6 g/dL (ref 30.0–36.0)
MCV: 90.1 fL (ref 78.0–100.0)
MPV: 9.3 fL (ref 8.6–12.4)
Monocytes Absolute: 0.5 10*3/uL (ref 0.1–1.0)
Monocytes Relative: 8 % (ref 3–12)
NEUTROS PCT: 64 % (ref 43–77)
Neutro Abs: 4 10*3/uL (ref 1.7–7.7)
PLATELETS: 158 10*3/uL (ref 150–400)
RBC: 4.23 MIL/uL (ref 4.22–5.81)
RDW: 14.3 % (ref 11.5–15.5)
WBC: 6.2 10*3/uL (ref 4.0–10.5)

## 2015-05-24 LAB — COMPLETE METABOLIC PANEL WITH GFR
ALK PHOS: 68 U/L (ref 40–115)
ALT: 17 U/L (ref 9–46)
AST: 14 U/L (ref 10–35)
Albumin: 3.9 g/dL (ref 3.6–5.1)
BUN: 12 mg/dL (ref 7–25)
CO2: 28 mmol/L (ref 20–31)
Calcium: 9 mg/dL (ref 8.6–10.3)
Chloride: 103 mmol/L (ref 98–110)
Creat: 1.08 mg/dL (ref 0.70–1.18)
GFR, EST NON AFRICAN AMERICAN: 66 mL/min (ref >=60)
GFR, Est African American: 77 mL/min (ref >=60)
GLUCOSE: 161 mg/dL — AB (ref 70–99)
Potassium: 4.6 mmol/L (ref 3.5–5.3)
SODIUM: 138 mmol/L (ref 135–146)
TOTAL PROTEIN: 6 g/dL — AB (ref 6.1–8.1)
Total Bilirubin: 0.4 mg/dL (ref 0.2–1.2)

## 2015-05-24 LAB — HEMOGLOBIN A1C
HEMOGLOBIN A1C: 6.8 % — AB (ref <5.7)
Mean Plasma Glucose: 148 mg/dL — ABNORMAL HIGH (ref <117)

## 2015-05-24 LAB — LIPID PANEL
CHOLESTEROL: 135 mg/dL (ref 125–200)
HDL: 42 mg/dL (ref >=40)
LDL Cholesterol: 72 mg/dL (ref <130)
TRIGLYCERIDES: 103 mg/dL (ref <150)
Total CHOL/HDL Ratio: 3.2 Ratio (ref <=5.0)
VLDL: 21 mg/dL (ref <30)

## 2015-05-25 ENCOUNTER — Other Ambulatory Visit: Payer: Self-pay | Admitting: Family Medicine

## 2015-05-25 DIAGNOSIS — Z0100 Encounter for examination of eyes and vision without abnormal findings: Secondary | ICD-10-CM

## 2015-05-25 DIAGNOSIS — H2513 Age-related nuclear cataract, bilateral: Secondary | ICD-10-CM | POA: Diagnosis not present

## 2015-05-25 DIAGNOSIS — E119 Type 2 diabetes mellitus without complications: Secondary | ICD-10-CM

## 2015-05-27 ENCOUNTER — Encounter: Payer: Self-pay | Admitting: Family Medicine

## 2015-05-27 ENCOUNTER — Ambulatory Visit (INDEPENDENT_AMBULATORY_CARE_PROVIDER_SITE_OTHER): Payer: Commercial Managed Care - HMO | Admitting: Family Medicine

## 2015-05-27 VITALS — BP 156/86 | HR 76 | Temp 98.1°F | Resp 18 | Wt 223.0 lb

## 2015-05-27 DIAGNOSIS — I10 Essential (primary) hypertension: Secondary | ICD-10-CM | POA: Diagnosis not present

## 2015-05-27 DIAGNOSIS — E1159 Type 2 diabetes mellitus with other circulatory complications: Secondary | ICD-10-CM

## 2015-05-27 DIAGNOSIS — I2510 Atherosclerotic heart disease of native coronary artery without angina pectoris: Secondary | ICD-10-CM | POA: Diagnosis not present

## 2015-05-27 DIAGNOSIS — E785 Hyperlipidemia, unspecified: Secondary | ICD-10-CM

## 2015-05-27 MED ORDER — HYDROCHLOROTHIAZIDE 25 MG PO TABS: 25.0000 mg | ORAL_TABLET | Freq: Every day | ORAL | Status: DC

## 2015-05-27 NOTE — Progress Notes (Signed)
Subjective:    Patient ID: Juan [name], male    DOB: 08-23-1940, 75 y.o.   MRN: [id number]  HPI 05/2014 Patient is here today for complete physical exam. He has no concerns. His last colonoscopy was 2014. They did find several tubular adenomas. He is due again in 2019. His last prostate exam was in July of last year and is not due until July of this year. Prevnar 13, Pneumovax 23, and the shingles vaccine are all up-to-date. We did discuss tetanus shot in the patient does not want a tetanus shot today. Patient's AAA was recently evaluated in February and there was no significant change in the size of his aneurysm. At that time, my plan was: Patient's physical exam is normal. His lab work is excellent. His blood pressure is excellent. Patient's AAA screening is up-to-date. His colonoscopy is up-to-date. His immunizations are up-to-date. I have recommended that the patient have his prostate checked here in September when he comes back for six-month check. At that time we can recheck a PSA as well as perform a digital rectal exam. Diabetic eye exam is up-to-date. Diabetic foot exam is performed today. He does have some slightly diminished pulses in his left foot particularly the posterior tibialis. However he has normal sensation in the feet  12/03/14 He is here today for follow up.  Hemoglobin A1c is outstanding at 6.2. However his blood pressure slightly elevated at 142/70. He states his blood pressure tends to range between 135 and 758 at home systolic. He has a history of a AAA as well as peripheral artery disease with less than 50% blockages in the iliac arteries bilaterally. He is taking an aspirin. He is quit smoking many many years ago. His LDL cholesterol is slightly higher than goal at 84. He is due for a PSA today. He is also due for his flu shot.  At that time, my plan was: Blood pressure is high given his AAA. I will increase metoprolol XL to 50 mg by mouth daily. He is due for PSA and I  will add that to his blood work. His diabetes is well controlled on make no changes in his medication at this time. However given his history of peripheral vascular disease, ASCVD, and coronary artery disease, his goal LDL cholesterol is less than 70. Patient would like to work on diet and exercise and recheck fasting lipid panel in 3 months. If LDL cholesterol is still greater than 70, I would increase Lipitor to 80 mg a day  05/27/15 Patient just had his diabetic eye exam and it was normal. His blood pressure is still high. He denies any chest pain shortness of breath or dyspnea on exertion. He has not been exercising recently and he has gained 10 pounds. Subsequently his hemoglobin A1c has risen from 6.2-6.8. He denies any polyuria, polydipsia, or blurred vision. He denies any myalgias or right upper quadrant pain. He has a history of peripheral artery disease with greater than 50% blockages in the iliac arteries bilaterally. However he denies any claudication. He does have some neuropathic pain in his feet. Appointment on 05/24/2015  Component Date Value Ref Range Status  . Sodium 05/24/2015 138  135 - 146 mmol/L Final  . Potassium 05/24/2015 4.6  3.5 - 5.3 mmol/L Final  . Chloride 05/24/2015 103  98 - 110 mmol/L Final  . CO2 05/24/2015 28  20 - 31 mmol/L Final  . Glucose, Bld 05/24/2015 161* 70 - 99 mg/dL Final  . BUN 05/24/2015  12  7 - 25 mg/dL Final  . Creat 05/24/2015 1.08  0.70 - 1.18 mg/dL Final  . Total Bilirubin 05/24/2015 0.4  0.2 - 1.2 mg/dL Final  . Alkaline Phosphatase 05/24/2015 68  40 - 115 U/L Final  . AST 05/24/2015 14  10 - 35 U/L Final  . ALT 05/24/2015 17  9 - 46 U/L Final  . Total Protein 05/24/2015 6.0* 6.1 - 8.1 g/dL Final  . Albumin 05/24/2015 3.9  3.6 - 5.1 g/dL Final  . Calcium 05/24/2015 9.0  8.6 - 10.3 mg/dL Final  . GFR, Est African American 05/24/2015 77  >=60 mL/min Final  . GFR, Est Non African American 05/24/2015 66  >=60 mL/min Final   Comment:   The  estimated GFR is a calculation valid for adults (>=51 years old) that uses the CKD-EPI algorithm to adjust for age and sex. It is   not to be used for children, pregnant women, hospitalized patients,    patients on dialysis, or with rapidly changing kidney function. According to the NKDEP, eGFR >89 is normal, 60-89 shows mild impairment, 30-59 shows moderate impairment, 15-29 shows severe impairment and <15 is ESRD.     Marland Kitchen Hgb A1c MFr Bld 05/24/2015 6.8* <5.7 % Final   Comment:                                                                        According to the ADA Clinical Practice Recommendations for 2011, when HbA1c is used as a screening test:     >=6.5%   Diagnostic of Diabetes Mellitus            (if abnormal result is confirmed)   5.7-6.4%   Increased risk of developing Diabetes Mellitus   References:Diagnosis and Classification of Diabetes Mellitus,Diabetes SWHQ,7591,63(WGYKZ 1):S62-S69 and Standards of Medical Care in         Diabetes - 2011,Diabetes LDJT,7017,79 (Suppl 1):S11-S61.     . Mean Plasma Glucose 05/24/2015 148* <117 mg/dL Final  . WBC 05/24/2015 6.2  4.0 - 10.5 K/uL Final  . RBC 05/24/2015 4.23  4.22 - 5.81 MIL/uL Final  . Hemoglobin 05/24/2015 12.8* 13.0 - 17.0 g/dL Final  . HCT 05/24/2015 38.1* 39.0 - 52.0 % Final  . MCV 05/24/2015 90.1  78.0 - 100.0 fL Final  . MCH 05/24/2015 30.3  26.0 - 34.0 pg Final  . MCHC 05/24/2015 33.6  30.0 - 36.0 g/dL Final  . RDW 05/24/2015 14.3  11.5 - 15.5 % Final  . Platelets 05/24/2015 158  150 - 400 K/uL Final  . MPV 05/24/2015 9.3  8.6 - 12.4 fL Final  . Neutrophils Relative % 05/24/2015 64  43 - 77 % Final  . Neutro Abs 05/24/2015 4.0  1.7 - 7.7 K/uL Final  . Lymphocytes Relative 05/24/2015 25  12 - 46 % Final  . Lymphs Abs 05/24/2015 1.6  0.7 - 4.0 K/uL Final  . Monocytes Relative 05/24/2015 8  3 - 12 % Final  . Monocytes Absolute 05/24/2015 0.5  0.1 - 1.0 K/uL Final  . Eosinophils Relative 05/24/2015 3  0 - 5 %  Final  . Eosinophils Absolute 05/24/2015 0.2  0.0 - 0.7 K/uL Final  . Basophils Relative 05/24/2015 0  0 - 1 % Final  . Basophils Absolute 05/24/2015 0.0  0.0 - 0.1 K/uL Final  . Smear Review 05/24/2015 Criteria for review not met   Final  . Cholesterol 05/24/2015 135  125 - 200 mg/dL Final  . Triglycerides 05/24/2015 103  <150 mg/dL Final  . HDL 05/24/2015 42  >=40 mg/dL Final  . Total CHOL/HDL Ratio 05/24/2015 3.2  <=5.0 Ratio Final  . VLDL 05/24/2015 21  <30 mg/dL Final  . LDL Cholesterol 05/24/2015 72  <130 mg/dL Final   Comment:   Total Cholesterol/HDL Ratio:CHD Risk                        Coronary Heart Disease Risk Table                                        Men       Women          1/2 Average Risk              3.4        3.3              Average Risk              5.0        4.4           2X Average Risk              9.6        7.1           3X Average Risk             23.4       11.0 Use the calculated Patient Ratio above and the CHD Risk table  to determine the patient's CHD Risk.    Past Medical History  Diagnosis Date  . AAA (abdominal aortic aneurysm) ([place]) 8/12    3.5cm  . CAD (coronary artery disease)   . Colon polyps   . Diabetes mellitus without complication ([place])   . Hyperlipidemia   . Hypertension   . Neuromuscular disorder ([place])     L2-3,L5-S1 bulging disc /nerve impingement  . 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 PAD (peripheral artery disease) [facility])    Past Surgical History  Procedure Laterality Date  . Tonsillectomy    . Coronary angioplasty      1989  . Colonoscopy N/A 09/27/2012    Procedure: COLONOSCOPY;  Surgeon: Daneil Dolin, MD;  Location: [facility];  Service: Endoscopy;  Laterality: N/A;  9:30   Current Outpatient Prescriptions on File Prior to Visit  Medication Sig Dispense Refill  . allopurinol (ZYLOPRIM) 300 MG tablet TAKE 1 TABLET EVERY DAY 90 tablet 0  . aspirin 325 MG EC tablet Take 325 mg by mouth daily.    Marland Kitchen  atorvastatin (LIPITOR) 20 MG tablet Take 1 tablet (20 mg total) by mouth daily. 90 tablet 3  . CARTIA XT 240 MG 24 hr capsule TAKE 1 CAPSULE EVERY DAY 90 capsule 0  . glipiZIDE (GLUCOTROL) 5 MG tablet TAKE 1 TABLET TWO TIMES DAILY BEFORE A MEAL (Patient taking differently: TAKE 1 TABLET ONCE DAILY BEFORE A MEAL) 180 tablet 3  . glucose blood (TRUETEST TEST) test strip 1 each by Other route as needed for other. Check bs bid  #200 / 11 refills    .  lisinopril (PRINIVIL,ZESTRIL) 40 MG tablet TAKE 1 TABLET EVERY DAY 90 tablet 0  . metFORMIN (GLUCOPHAGE) 500 MG tablet TAKE 1 TABLET TWICE DAILY WITH MEALS 180 tablet 0  . metoprolol succinate (TOPROL-XL) 25 MG 24 hr tablet Take 2 tablets (50 mg total) by mouth daily. 180 tablet 3  . Multiple Vitamin (MULTIVITAMIN WITH MINERALS) TABS Take 1 tablet by mouth daily.    . TRUEPLUS LANCETS 28G MISC Use as directed to monitor FSBS. DX: 250.00 210 each 3   No current facility-administered medications on file prior to visit.   No Known Allergies Social History   Social History  . Marital Status: Married    Spouse Name: N/A  . Number of Children: N/A  . Years of Education: N/A   Occupational History  . retired     Designer, television/film set    Social History Main Topics  . Smoking status: Former Smoker -- 35 years    Types: Cigarettes    Quit date: 01/02/1988  . Smokeless tobacco: Never Used  . Alcohol Use: Yes     Comment: rare beer, no history of ETOH abuse  . Drug Use: No  . Sexual Activity: Not on file   Other Topics Concern  . Not on file   Social History Narrative   Family History  Problem Relation Age of Onset  . Diabetes Mother   . Heart disease Mother     After age 48  . Heart attack Mother   . Hypertension Mother   . Diabetes Father   . Heart disease Father     After age 37  . Hypertension Father   . Heart attack Father   . Colon cancer Neg Hx   . Brain cancer Sister   . Cancer Sister     Brain  . Diabetes Sister   . Hypertension  Sister   . Cancer Brother     Chest Tumor  . Diabetes Brother   . Hypertension Brother   . Deep vein thrombosis Sister   . Diabetes Sister   . Diabetes Brother     Bilateral leg      Review of Systems  All other systems reviewed and are negative.      Objective:   Physical Exam  Constitutional: He is oriented to person, place, and time. He appears well-developed and well-nourished. No distress.  HENT:  Head: Normocephalic and atraumatic.  Right Ear: External ear normal.  Left Ear: External ear normal.  Nose: Nose normal.  Mouth/Throat: Oropharynx is clear and moist. No oropharyngeal exudate.  Eyes: Conjunctivae are normal. Pupils are equal, round, and reactive to light. Right eye exhibits no discharge. Left eye exhibits no discharge. No scleral icterus.  Neck: Normal range of motion. Neck supple. No JVD present. No tracheal deviation present. No thyromegaly present.  Cardiovascular: Normal rate, regular rhythm, normal heart sounds and intact distal pulses.  Exam reveals no gallop and no friction rub.   No murmur heard. Pulmonary/Chest: Effort normal and breath sounds normal. No stridor. No respiratory distress. He has no wheezes. He has no rales. He exhibits no tenderness.  Abdominal: Soft. Bowel sounds are normal. He exhibits no distension and no mass. There is no tenderness. There is no rebound and no guarding.  Musculoskeletal: Normal range of motion. He exhibits no edema or tenderness.  Lymphadenopathy:    He has no cervical adenopathy.  Neurological: He is alert and oriented to person, place, and time. He has normal reflexes. No cranial nerve deficit. He exhibits  normal muscle tone. Coordination normal.  Skin: Skin is warm. No rash noted. He is not diaphoretic. No erythema. No pallor.  Psychiatric: He has a normal mood and affect. His behavior is normal. Judgment and thought content normal.  Vitals reviewed.         Assessment & Plan:  Benign essential HTN - Plan:  hydrochlorothiazide (HYDRODIURIL) 25 MG tablet  HLD (hyperlipidemia)  ASCVD (arteriosclerotic cardiovascular disease)  Controlled type 2 diabetes mellitus with other circulatory complication, without long-term current use of insulin ([place])  Diabetic eye exam is up-to-date. Diabetic foot exam is performed today. I recommended adding hydrochlorothiazide 25 mg by mouth daily and recheck blood pressure in one month. Cholesterol is excellent. Hemoglobin A1c is above goal. I recommended diet exercise and 10 pounds weight loss and recheck in 6 months. We discussed repeating the ultrasound to evaluate his AAA which is been stable for 6 years. He would like to wait until next year. Prostate exam is due in September. The remainder of his preventative care is up-to-date.

## 2015-06-03 DIAGNOSIS — Z0100 Encounter for examination of eyes and vision without abnormal findings: Secondary | ICD-10-CM | POA: Diagnosis not present

## 2015-06-04 ENCOUNTER — Telehealth: Payer: Self-pay | Admitting: *Deleted

## 2015-06-04 NOTE — Telephone Encounter (Signed)
Submitted [employer] referral thru [facility] for authorization on 05/25/15 to Dr Early Chars with authorization [phone]  Requesting provider: Flonnie Hailstone  Treating provider:Mark Clayton  Number of visits: 6  Start Date: 05/25/15  End Date: 11/21/15  Dx: E11.9- Type 2 diabetes mellitus without complications       [id number] encounter for exam of eyes and vision w/o abnormal findings

## 2015-06-15 ENCOUNTER — Other Ambulatory Visit: Payer: Self-pay | Admitting: Family Medicine

## 2015-06-15 NOTE — Telephone Encounter (Signed)
Refill appropriate and filled per protocol. 

## 2015-08-19 ENCOUNTER — Other Ambulatory Visit: Payer: Self-pay | Admitting: Family Medicine

## 2015-08-20 NOTE — Telephone Encounter (Signed)
Refill appropriate and filled per protocol. 

## 2015-09-26 ENCOUNTER — Other Ambulatory Visit: Payer: Self-pay | Admitting: Family Medicine

## 2015-10-26 ENCOUNTER — Other Ambulatory Visit: Payer: Self-pay | Admitting: Family Medicine

## 2015-11-22 ENCOUNTER — Other Ambulatory Visit: Payer: Commercial Managed Care - HMO

## 2015-11-22 ENCOUNTER — Other Ambulatory Visit: Payer: Self-pay | Admitting: Family Medicine

## 2015-11-22 DIAGNOSIS — E785 Hyperlipidemia, unspecified: Secondary | ICD-10-CM

## 2015-11-22 DIAGNOSIS — Z125 Encounter for screening for malignant neoplasm of prostate: Secondary | ICD-10-CM | POA: Diagnosis not present

## 2015-11-22 DIAGNOSIS — E119 Type 2 diabetes mellitus without complications: Secondary | ICD-10-CM | POA: Diagnosis not present

## 2015-11-22 LAB — CBC WITH DIFFERENTIAL/PLATELET
BASOS ABS: 0 {cells}/uL (ref 0–200)
Basophils Relative: 0 %
EOS ABS: 186 {cells}/uL (ref 15–500)
Eosinophils Relative: 3 %
HEMATOCRIT: 37.8 % — AB (ref 38.5–50.0)
HEMOGLOBIN: 12.6 g/dL — AB (ref 13.0–17.0)
LYMPHS ABS: 1736 {cells}/uL (ref 850–3900)
LYMPHS PCT: 28 %
MCH: 30.7 pg (ref 27.0–33.0)
MCHC: 33.3 g/dL (ref 32.0–36.0)
MCV: 92 fL (ref 80.0–100.0)
MONO ABS: 558 {cells}/uL (ref 200–950)
MPV: 9.4 fL (ref 7.5–12.5)
Monocytes Relative: 9 %
NEUTROS PCT: 60 %
Neutro Abs: 3720 cells/uL (ref 1500–7800)
Platelets: 141 10*3/uL (ref 140–400)
RBC: 4.11 MIL/uL — AB (ref 4.20–5.80)
RDW: 14 % (ref 11.0–15.0)
WBC: 6.2 10*3/uL (ref 3.8–10.8)

## 2015-11-22 LAB — COMPLETE METABOLIC PANEL WITH GFR
ALBUMIN: 4.4 g/dL (ref 3.6–5.1)
ALK PHOS: 57 U/L (ref 40–115)
ALT: 16 U/L (ref 9–46)
AST: 18 U/L (ref 10–35)
BUN: 17 mg/dL (ref 7–25)
CALCIUM: 9.7 mg/dL (ref 8.6–10.3)
CO2: 26 mmol/L (ref 20–31)
Chloride: 101 mmol/L (ref 98–110)
Creat: 1.3 mg/dL — ABNORMAL HIGH (ref 0.70–1.18)
GFR, EST AFRICAN AMERICAN: 61 mL/min (ref >=60)
GFR, EST NON AFRICAN AMERICAN: 53 mL/min — AB (ref >=60)
GLUCOSE: 165 mg/dL — AB (ref 70–99)
POTASSIUM: 4.4 mmol/L (ref 3.5–5.3)
SODIUM: 140 mmol/L (ref 135–146)
Total Bilirubin: 0.5 mg/dL (ref 0.2–1.2)
Total Protein: 6.6 g/dL (ref 6.1–8.1)

## 2015-11-22 LAB — LIPID PANEL
Cholesterol: 137 mg/dL (ref 125–200)
HDL: 38 mg/dL — AB (ref >=40)
LDL CALC: 59 mg/dL (ref <130)
TRIGLYCERIDES: 198 mg/dL — AB (ref <150)
Total CHOL/HDL Ratio: 3.6 Ratio (ref <=5.0)
VLDL: 40 mg/dL — AB (ref <30)

## 2015-11-23 LAB — HEMOGLOBIN A1C
HEMOGLOBIN A1C: 6.3 % — AB (ref <5.7)
MEAN PLASMA GLUCOSE: 134 mg/dL

## 2015-11-26 ENCOUNTER — Encounter: Payer: Self-pay | Admitting: Family Medicine

## 2015-11-26 ENCOUNTER — Ambulatory Visit (INDEPENDENT_AMBULATORY_CARE_PROVIDER_SITE_OTHER): Payer: Commercial Managed Care - HMO | Admitting: Family Medicine

## 2015-11-26 VITALS — BP 132/66 | HR 74 | Temp 98.2°F | Resp 18 | Ht 70.5 in | Wt 223.0 lb

## 2015-11-26 DIAGNOSIS — Z0000 Encounter for general adult medical examination without abnormal findings: Secondary | ICD-10-CM | POA: Diagnosis not present

## 2015-11-26 DIAGNOSIS — R0989 Other specified symptoms and signs involving the circulatory and respiratory systems: Secondary | ICD-10-CM | POA: Diagnosis not present

## 2015-11-26 DIAGNOSIS — I2510 Atherosclerotic heart disease of native coronary artery without angina pectoris: Secondary | ICD-10-CM | POA: Diagnosis not present

## 2015-11-26 DIAGNOSIS — I10 Essential (primary) hypertension: Secondary | ICD-10-CM

## 2015-11-26 DIAGNOSIS — Z23 Encounter for immunization: Secondary | ICD-10-CM | POA: Diagnosis not present

## 2015-11-26 DIAGNOSIS — E785 Hyperlipidemia, unspecified: Secondary | ICD-10-CM

## 2015-11-26 DIAGNOSIS — Z125 Encounter for screening for malignant neoplasm of prostate: Secondary | ICD-10-CM

## 2015-11-26 DIAGNOSIS — E1159 Type 2 diabetes mellitus with other circulatory complications: Secondary | ICD-10-CM | POA: Diagnosis not present

## 2015-11-26 NOTE — Progress Notes (Signed)
Subjective:    Patient ID: Juan [name], male    DOB: 1940/04/19, 75 y.o.   MRN: [id number]  HPI Patient is here today for complete physical exam. He has no concerns. His last colonoscopy was 2014. They did find several tubular adenomas. He is due again in 2019.  Prevnar 13, Pneumovax 23, and the shingles vaccine are all up-to-date. We did discuss tetanus shot in the patient does not want a tetanus shot today. Today on his examination, he has a coincidental finding of a carotid bruit bilaterally.  He also has a very soft murmur stemming from the aortic valve which mimic a bruit.  Otherwise he is doing well and his most recent lab work is listed below: Appointment on 11/22/2015  Component Date Value Ref Range Status  . Sodium 11/22/2015 140  135 - 146 mmol/L Final  . Potassium 11/22/2015 4.4  3.5 - 5.3 mmol/L Final  . Chloride 11/22/2015 101  98 - 110 mmol/L Final  . CO2 11/22/2015 26  20 - 31 mmol/L Final  . Glucose, Bld 11/22/2015 165* 70 - 99 mg/dL Final  . BUN 11/22/2015 17  7 - 25 mg/dL Final  . Creat 11/22/2015 1.30* 0.70 - 1.18 mg/dL Final   Comment:   For patients > or = 77 years of age: The upper reference limit for Creatinine is approximately 13% higher for people identified as African-American.     . Total Bilirubin 11/22/2015 0.5  0.2 - 1.2 mg/dL Final  . Alkaline Phosphatase 11/22/2015 57  40 - 115 U/L Final  . AST 11/22/2015 18  10 - 35 U/L Final  . ALT 11/22/2015 16  9 - 46 U/L Final  . Total Protein 11/22/2015 6.6  6.1 - 8.1 g/dL Final  . Albumin 11/22/2015 4.4  3.6 - 5.1 g/dL Final  . Calcium 11/22/2015 9.7  8.6 - 10.3 mg/dL Final  . GFR, Est African American 11/22/2015 61  >=60 mL/min Final  . GFR, Est Non African American 11/22/2015 53* >=60 mL/min Final  . Hgb A1c MFr Bld 11/23/2015 6.3* <5.7 % Final   Comment:   For someone without known diabetes, a hemoglobin A1c value between 5.7% and 6.4% is consistent with prediabetes and should be confirmed with a  follow-up test.   For someone with known diabetes, a value <7% indicates that their diabetes is well controlled. A1c targets should be individualized based on duration of diabetes, age, co-morbid conditions and other considerations.   This assay result is consistent with an increased risk of diabetes.   Currently, no consensus exists regarding use of hemoglobin A1c for diagnosis of diabetes in children.     . Mean Plasma Glucose 11/23/2015 134  mg/dL Final  . WBC 11/22/2015 6.2  3.8 - 10.8 K/uL Final  . RBC 11/22/2015 4.11* 4.20 - 5.80 MIL/uL Final  . Hemoglobin 11/22/2015 12.6* 13.0 - 17.0 g/dL Final  . HCT 11/22/2015 37.8* 38.5 - 50.0 % Final  . MCV 11/22/2015 92.0  80.0 - 100.0 fL Final  . MCH 11/22/2015 30.7  27.0 - 33.0 pg Final  . MCHC 11/22/2015 33.3  32.0 - 36.0 g/dL Final  . RDW 11/22/2015 14.0  11.0 - 15.0 % Final  . Platelets 11/22/2015 141  140 - 400 K/uL Final  . MPV 11/22/2015 9.4  7.5 - 12.5 fL Final  . Neutro Abs 11/22/2015 3720  1,500 - 7,800 cells/uL Final  . Lymphs Abs 11/22/2015 1736  850 - 3,900 cells/uL Final  . Monocytes Absolute  11/22/2015 558  200 - 950 cells/uL Final  . Eosinophils Absolute 11/22/2015 186  15 - 500 cells/uL Final  . Basophils Absolute 11/22/2015 0  0 - 200 cells/uL Final  . Neutrophils Relative % 11/22/2015 60  % Final  . Lymphocytes Relative 11/22/2015 28  % Final  . Monocytes Relative 11/22/2015 9  % Final  . Eosinophils Relative 11/22/2015 3  % Final  . Basophils Relative 11/22/2015 0  % Final  . Smear Review 11/22/2015 Criteria for review not met   Final  . Cholesterol 11/22/2015 137  125 - 200 mg/dL Final  . Triglycerides 11/22/2015 198* <150 mg/dL Final  . HDL 11/22/2015 38* >=40 mg/dL Final  . Total CHOL/HDL Ratio 11/22/2015 3.6  <=5.0 Ratio Final  . VLDL 11/22/2015 40* <30 mg/dL Final  . LDL Cholesterol 11/22/2015 59  <130 mg/dL Final   Comment:   Total Cholesterol/HDL Ratio:CHD Risk                        Coronary Heart  Disease Risk Table                                        Men       Women          1/2 Average Risk              3.4        3.3              Average Risk              5.0        4.4           2X Average Risk              9.6        7.1           3X Average Risk             23.4       11.0 Use the calculated Patient Ratio above and the CHD Risk table  to determine the patient's CHD Risk.    Past Medical History:  Diagnosis Date  . AAA (abdominal aortic aneurysm) ([place]) 8/12   3.5cm  . CAD (coronary artery disease)   . Colon polyps   . Diabetes mellitus without complication ([place])   . 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 Hyperlipidemia   . Hypertension   . Neuromuscular disorder ([place])    L2-3,L5-S1 bulging disc /nerve impingement  . PAD (peripheral artery disease) ([place])    Past Surgical History:  Procedure Laterality Date  . COLONOSCOPY N/A 09/27/2012   Procedure: COLONOSCOPY;  Surgeon: Daneil Dolin, MD;  Location: [facility];  Service: Endoscopy;  Laterality: N/A;  9:30  . CORONARY ANGIOPLASTY     1989  . TONSILLECTOMY     Current Outpatient Prescriptions on File Prior to Visit  Medication Sig Dispense Refill  . allopurinol (ZYLOPRIM) 300 MG tablet TAKE 1 TABLET EVERY DAY (NEED MD APPOINTMENT) 90 tablet 3  . aspirin 325 MG EC tablet Take 325 mg by mouth daily.    Marland Kitchen atorvastatin (LIPITOR) 20 MG tablet Take 1 tablet (20 mg total) by mouth daily. 90 tablet 3  . diltiazem (CARTIA XT) 240 MG 24 hr capsule Take 1 capsule (240 mg  total) by mouth daily. 90 capsule 3  . glipiZIDE (GLUCOTROL) 5 MG tablet TAKE 1 TABLET TWO TIMES DAILY BEFORE A MEAL (Patient taking differently: TAKE 1 TABLET ONCE DAILY BEFORE A MEAL) 180 tablet 3  . glucose blood (TRUETEST TEST) test strip 1 each by Other route as needed for other. Check bs bid  #200 / 11 refills    . hydrochlorothiazide (HYDRODIURIL) 25 MG tablet Take 1 tablet (25 mg total) by mouth daily. 90 tablet 3  . lisinopril  (PRINIVIL,ZESTRIL) 40 MG tablet TAKE 1 TABLET EVERY DAY (NEED MD APPOINTMENT) 90 tablet 3  . metFORMIN (GLUCOPHAGE) 500 MG tablet TAKE 1 TABLET TWICE DAILY WITH MEALS (NEED MD APPOINTMENT) 180 tablet 3  . metoprolol succinate (TOPROL-XL) 25 MG 24 hr tablet TAKE 2 TABLETS EVERY DAY 180 tablet 3  . Multiple Vitamin (MULTIVITAMIN WITH MINERALS) TABS Take 1 tablet by mouth daily.    . TRUEPLUS LANCETS 28G MISC Use as directed to monitor FSBS. DX: 250.00 210 each 3   No current facility-administered medications on file prior to visit.    No Known Allergies Social History   Social History  . Marital status: Married    Spouse name: N/A  . Number of children: N/A  . Years of education: N/A   Occupational History  . retired Retired    Designer, television/film set    Social History Main Topics  . Smoking status: Former Smoker    Years: 35.00    Types: Cigarettes    Quit date: 01/02/1988  . Smokeless tobacco: Never Used  . Alcohol use Yes     Comment: rare beer, no history of ETOH abuse  . Drug use: No  . Sexual activity: Not on file   Other Topics Concern  . Not on file   Social History Narrative  . No narrative on file   Family History  Problem Relation Age of Onset  . Diabetes Mother   . Heart disease Mother     After age 42  . Heart attack Mother   . Hypertension Mother   . Diabetes Father   . Heart disease Father     After age 34  . Hypertension Father   . Heart attack Father   . Brain cancer Sister   . Cancer Sister     Brain  . Diabetes Sister   . Hypertension Sister   . Cancer Brother     Chest Tumor  . Diabetes Brother   . Hypertension Brother   . Deep vein thrombosis Sister   . Diabetes Sister   . Diabetes Brother     Bilateral leg  . Colon cancer Neg Hx       Review of Systems  All other systems reviewed and are negative.      Objective:   Physical Exam  Constitutional: He is oriented to person, place, and time. He appears well-developed and well-nourished. No  distress.  HENT:  Head: Normocephalic and atraumatic.  Right Ear: External ear normal.  Left Ear: External ear normal.  Nose: Nose normal.  Mouth/Throat: Oropharynx is clear and moist. No oropharyngeal exudate.  Eyes: Conjunctivae are normal. Pupils are equal, round, and reactive to light. Right eye exhibits no discharge. Left eye exhibits no discharge. No scleral icterus.  Neck: Normal range of motion. Neck supple. No JVD present. No tracheal deviation present. No thyromegaly present.  Cardiovascular: Normal rate, regular rhythm, normal heart sounds and intact distal pulses.  Exam reveals no gallop and no friction rub.  No murmur heard. Pulmonary/Chest: Effort normal and breath sounds normal. No stridor. No respiratory distress. He has no wheezes. He has no rales. He exhibits no tenderness.  Abdominal: Soft. Bowel sounds are normal. He exhibits no distension and no mass. There is no tenderness. There is no rebound and no guarding.  Musculoskeletal: Normal range of motion. He exhibits no edema or tenderness.  Lymphadenopathy:    He has no cervical adenopathy.  Neurological: He is alert and oriented to person, place, and time. He has normal reflexes. No cranial nerve deficit. He exhibits normal muscle tone. Coordination normal.  Skin: Skin is warm. No rash noted. He is not diaphoretic. No erythema. No pallor.  Psychiatric: He has a normal mood and affect. His behavior is normal. Judgment and thought content normal.  Vitals reviewed.         Assessment & Plan:  Controlled type 2 diabetes mellitus with other circulatory complication, without long-term current use of insulin ([facility])  ASCVD (arteriosclerotic cardiovascular disease)  HLD (hyperlipidemia)  Benign essential HTN  Prostate cancer screening  Routine general medical examination at a health care facility  Patient's blood sugar is well controlled. His blood pressure is excellent. Colon cancer screening is up-to-date. I will  add a PSA to his lab work. He declines a tetanus shot. The remainder of his immunizations are up-to-date.  I am concerned about the bruit I appreciate in his carotid arteries. It is possible that this is transmitted sounds stemming from his murmur heard best over the aortic valve however I will schedule the patient for carotid Dopplers to evaluate further.

## 2015-11-27 LAB — PSA: PSA: 0.6 ng/mL (ref <=4.0)

## 2015-11-29 ENCOUNTER — Encounter: Payer: Self-pay | Admitting: Family Medicine

## 2015-11-29 NOTE — Addendum Note (Signed)
Addended by: Shary Decamp B on: 11/29/2015 09:01 AM   Modules accepted: Orders

## 2015-12-06 ENCOUNTER — Encounter: Payer: Self-pay | Admitting: Family Medicine

## 2015-12-10 ENCOUNTER — Encounter: Payer: Self-pay | Admitting: Family Medicine

## 2015-12-16 ENCOUNTER — Ambulatory Visit (HOSPITAL_COMMUNITY): Payer: Commercial Managed Care - HMO

## 2015-12-17 ENCOUNTER — Ambulatory Visit (HOSPITAL_COMMUNITY)
Admission: RE | Admit: 2015-12-17 | Discharge: 2015-12-17 | Disposition: A | Discharge status: Home / Self Care | Payer: Commercial Managed Care - HMO | Source: Ambulatory Visit | Attending: Family Medicine | Admitting: Family Medicine

## 2015-12-17 DIAGNOSIS — R0989 Other specified symptoms and signs involving the circulatory and respiratory systems: Secondary | ICD-10-CM | POA: Diagnosis present

## 2015-12-17 DIAGNOSIS — I6523 Occlusion and stenosis of bilateral carotid arteries: Secondary | ICD-10-CM | POA: Insufficient documentation

## 2015-12-20 ENCOUNTER — Encounter: Payer: Self-pay | Admitting: Family Medicine

## 2015-12-20 DIAGNOSIS — I6521 Occlusion and stenosis of right carotid artery: Secondary | ICD-10-CM | POA: Insufficient documentation

## 2015-12-24 ENCOUNTER — Other Ambulatory Visit: Payer: Self-pay | Admitting: Family Medicine

## 2015-12-24 DIAGNOSIS — I6521 Occlusion and stenosis of right carotid artery: Secondary | ICD-10-CM

## 2015-12-24 MED ORDER — ATORVASTATIN CALCIUM 80 MG PO TABS: 80.0000 mg | ORAL_TABLET | Freq: Every day | ORAL | 3 refills | Status: DC

## 2016-01-18 ENCOUNTER — Encounter: Payer: Self-pay | Admitting: Vascular Surgery

## 2016-01-19 ENCOUNTER — Ambulatory Visit (INDEPENDENT_AMBULATORY_CARE_PROVIDER_SITE_OTHER): Payer: Commercial Managed Care - HMO | Admitting: Vascular Surgery

## 2016-01-19 ENCOUNTER — Encounter: Payer: Self-pay | Admitting: Vascular Surgery

## 2016-01-19 VITALS — BP 141/69 | HR 64 | Temp 97.0°F | Resp 20 | Ht 70.5 in | Wt 225.1 lb

## 2016-01-19 DIAGNOSIS — I714 Abdominal aortic aneurysm, without rupture, unspecified: Secondary | ICD-10-CM

## 2016-01-19 DIAGNOSIS — I6523 Occlusion and stenosis of bilateral carotid arteries: Secondary | ICD-10-CM | POA: Diagnosis not present

## 2016-01-19 NOTE — Progress Notes (Signed)
Referring Physician: Dr Dennard Schaumann  Patient name: Juan [name] MRN: [id number] DOB: 08-10-1940 Sex: male  REASON FOR CONSULT: asymptomatic right carotid stenosis  HPI: Juan [name] is a 75 y.o. male,  patient had a carotid duplex scan at [facility] for evaluation of an  Asymptomatic carotid bruit. Right side was 50-69%, left side less than 50% antegrade vertebral flow bilaterally. This ultrasound was done on 12/17/2015. Other medical problems include 3.3 cm AAA 2/16. He has been followed for the abdominal aortic aneurysm by [place] for several years. Other medical problems include diabetes, hyperlipidemia, hypertension all of which have been stable. He is on aspirin and a statin.  Past Medical History:  Diagnosis Date  . AAA (abdominal aortic aneurysm) ([place]) 8/12   3.5cm  . CAD (coronary artery disease)   . Colon polyps   . Diabetes mellitus without complication ([place])   . 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 Hyperlipidemia   . Hypertension   . 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 Neuromuscular disorder ([place])    L2-3,L5-S1 bulging disc /nerve impingement  . PAD (peripheral artery disease) ([place])    Past Surgical History:  Procedure Laterality Date  . COLONOSCOPY N/A 09/27/2012   Procedure: COLONOSCOPY;  Surgeon: Daneil Dolin, MD;  Location: [facility];  Service: Endoscopy;  Laterality: N/A;  9:30  . CORONARY ANGIOPLASTY     1989  . TONSILLECTOMY      Family History  Problem Relation Age of Onset  . Diabetes Mother   . Heart disease Mother     After age 83  . Heart attack Mother   . Hypertension Mother   . Diabetes Father   . Heart disease Father     After age 42  . Hypertension Father   . Heart attack Father   . Brain cancer Sister   . Cancer Sister     Brain  . Diabetes Sister   . Hypertension Sister   . Cancer Brother     Chest Tumor  . Diabetes Brother   . Hypertension Brother   .  Deep vein thrombosis Sister   . Diabetes Sister   . Diabetes Brother     Bilateral leg  . Colon cancer Neg Hx     SOCIAL HISTORY: Social History   Social History  . Marital status: Married    Spouse name: N/A  . Number of children: N/A  . Years of education: N/A   Occupational History  . retired Retired    Designer, television/film set    Social History Main Topics  . Smoking status: Former Smoker    Years: 35.00    Types: Cigarettes    Quit date: 01/02/1988  . Smokeless tobacco: Never Used  . Alcohol use Yes     Comment: rare beer, no history of ETOH abuse  . Drug use: No  . Sexual activity: Not on file   Other Topics Concern  . Not on file   Social History Narrative  . No narrative on file    No Known Allergies  Current Outpatient Prescriptions  Medication Sig Dispense Refill  . allopurinol (ZYLOPRIM) 300 MG tablet TAKE 1 TABLET EVERY DAY (NEED MD APPOINTMENT) 90 tablet 3  . aspirin 325 MG EC tablet Take 325 mg by mouth daily.    Marland Kitchen atorvastatin (LIPITOR) 80 MG tablet Take 1 tablet (80 mg total) by mouth daily. 90 tablet 3  .  diltiazem (CARTIA XT) 240 MG 24 hr capsule Take 1 capsule (240 mg total) by mouth daily. 90 capsule 3  . glipiZIDE (GLUCOTROL) 5 MG tablet TAKE 1 TABLET TWO TIMES DAILY BEFORE A MEAL (Patient taking differently: TAKE 1 TABLET ONCE DAILY BEFORE A MEAL) 180 tablet 3  . glucose blood (TRUETEST TEST) test strip 1 each by Other route as needed for other. Check bs bid  #200 / 11 refills    . hydrochlorothiazide (HYDRODIURIL) 25 MG tablet Take 1 tablet (25 mg total) by mouth daily. 90 tablet 3  . lisinopril (PRINIVIL,ZESTRIL) 40 MG tablet TAKE 1 TABLET EVERY DAY (NEED MD APPOINTMENT) 90 tablet 3  . metFORMIN (GLUCOPHAGE) 500 MG tablet TAKE 1 TABLET TWICE DAILY WITH MEALS (NEED MD APPOINTMENT) 180 tablet 3  . metoprolol succinate (TOPROL-XL) 25 MG 24 hr tablet TAKE 2 TABLETS EVERY DAY 180 tablet 3  . Multiple Vitamin (MULTIVITAMIN WITH MINERALS) TABS Take 1 tablet by  mouth daily.    . TRUEPLUS LANCETS 28G MISC Use as directed to monitor FSBS. DX: 250.00 210 each 3   No current facility-administered medications for this visit.     ROS:   General:  No weight loss, Fever, chills  HEENT: No recent headaches, no nasal bleeding, no visual changes, no sore throat  Neurologic: No dizziness, blackouts, seizures. No recent symptoms of stroke or mini- stroke. No recent episodes of slurred speech, or temporary blindness.  Cardiac: No recent episodes of chest pain/pressure, no shortness of breath at rest.  No shortness of breath with exertion.  Denies history of atrial fibrillation or irregular heartbeat  Vascular: No history of rest pain in feet.  No history of claudication.  No history of non-healing ulcer, No history of DVT   Pulmonary: No home oxygen, no productive cough, no hemoptysis,  No asthma or wheezing  Musculoskeletal:  [ ]  Arthritis, [ ]  Low back pain,  [ ]  Joint pain  Hematologic:No history of hypercoagulable state.  No history of easy bleeding.  No history of anemia  Gastrointestinal: No hematochezia or melena,  No gastroesophageal reflux, no trouble swallowing  Urinary: [ ]  chronic Kidney disease, [ ]  on HD - [ ]  MWF or [ ]  TTHS, [ ]  Burning with urination, [ ]  Frequent urination, [ ]  Difficulty urinating;   Skin: No rashes  Psychological: No history of anxiety,  No history of depression   Physical Examination  Vitals:   01/19/16 1010 01/19/16 1011  BP: (!) 150/70 (!) 141/69  Pulse: 64   Resp: 20   Temp: 97 F (36.1 C)   TempSrc: Oral   SpO2: 99%   Weight: 225 lb 1.6 oz (102.1 kg)   Height: 5' 10.5" (1.791 m)     Body mass index is 31.84 kg/m.  General:  Alert and oriented, no acute distress HEENT: Normal Neck: No bruit or JVD Pulmonary: Clear to auscultation bilaterally Cardiac: Regular Rate and Rhythm without murmur Abdomen: Soft, non-tender, non-distended, no mass, no scars Skin: No rash Extremity Pulses:  2+  radial, brachial, femoral, dorsalis pedis, posterior tibial pulses bilaterally Musculoskeletal: No deformity or edema  Neurologic: Upper and lower extremity motor 5/5 and symmetric  DATA:  I reviewed the patient's carotid duplex scan from [facility]. Velocities on the right were 149/30 left was 122/26  ASSESSMENT:  Moderate right internal carotid artery stenosis some atherosclerotic plaque left side asymptomatic   PLAN:  Follow-up carotid duplex scan as well as abdominal aortic ultrasound in 6 months time. Patient  was advised on signs symptoms of TIA amaurosis or stroke. He develops any these symptoms prior to his 6 month appointment to return sooner. Otherwise he'll continue his aspirin and statin.   Ruta Hinds, MD [facility] Office: [phone] Pager: [phone]

## 2016-02-01 NOTE — Addendum Note (Signed)
Addended by: Lianne Cure A on: 02/01/2016 10:28 AM   Modules accepted: Orders

## 2016-03-09 ENCOUNTER — Other Ambulatory Visit: Payer: Self-pay | Admitting: Family Medicine

## 2016-03-09 DIAGNOSIS — I10 Essential (primary) hypertension: Secondary | ICD-10-CM

## 2016-05-30 DIAGNOSIS — E119 Type 2 diabetes mellitus without complications: Secondary | ICD-10-CM | POA: Diagnosis not present

## 2016-05-30 DIAGNOSIS — H2513 Age-related nuclear cataract, bilateral: Secondary | ICD-10-CM | POA: Diagnosis not present

## 2016-05-30 DIAGNOSIS — H524 Presbyopia: Secondary | ICD-10-CM | POA: Diagnosis not present

## 2016-05-30 DIAGNOSIS — H5203 Hypermetropia, bilateral: Secondary | ICD-10-CM | POA: Diagnosis not present

## 2016-05-30 DIAGNOSIS — H52203 Unspecified astigmatism, bilateral: Secondary | ICD-10-CM | POA: Diagnosis not present

## 2016-05-31 ENCOUNTER — Other Ambulatory Visit: Payer: Medicare HMO

## 2016-05-31 DIAGNOSIS — E785 Hyperlipidemia, unspecified: Secondary | ICD-10-CM | POA: Diagnosis not present

## 2016-05-31 DIAGNOSIS — E119 Type 2 diabetes mellitus without complications: Secondary | ICD-10-CM | POA: Diagnosis not present

## 2016-05-31 LAB — CBC WITH DIFFERENTIAL/PLATELET
BASOS ABS: 0 {cells}/uL (ref 0–200)
Basophils Relative: 0 %
EOS ABS: 252 {cells}/uL (ref 15–500)
Eosinophils Relative: 4 %
HEMATOCRIT: 37.9 % — AB (ref 38.5–50.0)
HEMOGLOBIN: 12.6 g/dL — AB (ref 13.0–17.0)
LYMPHS ABS: 1638 {cells}/uL (ref 850–3900)
Lymphocytes Relative: 26 %
MCH: 31.1 pg (ref 27.0–33.0)
MCHC: 33.2 g/dL (ref 32.0–36.0)
MCV: 93.6 fL (ref 80.0–100.0)
MONO ABS: 567 {cells}/uL (ref 200–950)
MPV: 9 fL (ref 7.5–12.5)
Monocytes Relative: 9 %
NEUTROS PCT: 61 %
Neutro Abs: 3843 cells/uL (ref 1500–7800)
PLATELETS: 154 10*3/uL (ref 140–400)
RBC: 4.05 MIL/uL — ABNORMAL LOW (ref 4.20–5.80)
RDW: 14.7 % (ref 11.0–15.0)
WBC: 6.3 10*3/uL (ref 3.8–10.8)

## 2016-05-31 LAB — COMPLETE METABOLIC PANEL WITH GFR
ALT: 82 U/L — ABNORMAL HIGH (ref 9–46)
AST: 43 U/L — ABNORMAL HIGH (ref 10–35)
Albumin: 4.1 g/dL (ref 3.6–5.1)
Alkaline Phosphatase: 165 U/L — ABNORMAL HIGH (ref 40–115)
BILIRUBIN TOTAL: 0.6 mg/dL (ref 0.2–1.2)
BUN: 17 mg/dL (ref 7–25)
CO2: 24 mmol/L (ref 20–31)
CREATININE: 1.33 mg/dL — AB (ref 0.70–1.18)
Calcium: 9.2 mg/dL (ref 8.6–10.3)
Chloride: 100 mmol/L (ref 98–110)
GFR, EST AFRICAN AMERICAN: 59 mL/min — AB (ref >=60)
GFR, Est Non African American: 51 mL/min — ABNORMAL LOW (ref >=60)
GLUCOSE: 202 mg/dL — AB (ref 70–99)
Potassium: 4.5 mmol/L (ref 3.5–5.3)
SODIUM: 137 mmol/L (ref 135–146)
Total Protein: 6.4 g/dL (ref 6.1–8.1)

## 2016-05-31 LAB — LIPID PANEL
CHOL/HDL RATIO: 3.2 ratio (ref <5.0)
Cholesterol: 148 mg/dL (ref <200)
HDL: 46 mg/dL (ref >40)
LDL Cholesterol: 75 mg/dL (ref <100)
Triglycerides: 133 mg/dL (ref <150)
VLDL: 27 mg/dL (ref <30)

## 2016-06-01 LAB — HEMOGLOBIN A1C
Hgb A1c MFr Bld: 8.2 % — ABNORMAL HIGH (ref <5.7)
Mean Plasma Glucose: 189 mg/dL

## 2016-06-02 ENCOUNTER — Encounter: Payer: Self-pay | Admitting: Family Medicine

## 2016-06-02 ENCOUNTER — Ambulatory Visit (INDEPENDENT_AMBULATORY_CARE_PROVIDER_SITE_OTHER): Payer: Medicare HMO | Admitting: Family Medicine

## 2016-06-02 VITALS — BP 150/82 | HR 81 | Temp 97.5°F | Resp 16 | Wt 225.0 lb

## 2016-06-02 DIAGNOSIS — I10 Essential (primary) hypertension: Secondary | ICD-10-CM | POA: Diagnosis not present

## 2016-06-02 DIAGNOSIS — E1159 Type 2 diabetes mellitus with other circulatory complications: Secondary | ICD-10-CM

## 2016-06-02 DIAGNOSIS — E7800 Pure hypercholesterolemia, unspecified: Secondary | ICD-10-CM | POA: Diagnosis not present

## 2016-06-02 DIAGNOSIS — I2510 Atherosclerotic heart disease of native coronary artery without angina pectoris: Secondary | ICD-10-CM

## 2016-06-02 MED ORDER — EMPAGLIFLOZIN 25 MG PO TABS: 25.0000 mg | ORAL_TABLET | Freq: Every day | ORAL | 5 refills | Status: DC

## 2016-06-02 NOTE — Progress Notes (Signed)
Subjective:    Patient ID: Juan [name], male    DOB: 10-Aug-1940, 76 y.o.   MRN: [id number]  HPI At the patient's last visit, he was found to have bilateral carotid bruits. Carotid Doppler showed bilateral carotid artery stenosis most severe on the right with 50-69% stenosis.  Less than 50% on the left.  Lipitor was increased to 80 mg a day and vascular surgery was consulted.  Saw Dr. Oneida Alar who recommended recheck in 6 months.  During that 6 month period of time, the patient admits that his diet has been poor. He is very much eating whatever he wants. As result his LDL cholesterol has actually worsened even though he is on a higher dose of Lipitor. His hemoglobin A1c has risen from 6.3 to greater than 8. His liver tests have also become slightly inflamed suggesting fatty liver disease versus hepatitis secondary to the statin medication.    Appointment on 05/31/2016  Component Date Value Ref Range Status  . Sodium 05/31/2016 137  135 - 146 mmol/L Final  . Potassium 05/31/2016 4.5  3.5 - 5.3 mmol/L Final  . Chloride 05/31/2016 100  98 - 110 mmol/L Final  . CO2 05/31/2016 24  20 - 31 mmol/L Final  . Glucose, Bld 05/31/2016 202* 70 - 99 mg/dL Final  . BUN 05/31/2016 17  7 - 25 mg/dL Final  . Creat 05/31/2016 1.33* 0.70 - 1.18 mg/dL Final   Comment:   For patients > or = 78 years of age: The upper reference limit for Creatinine is approximately 13% higher for people identified as African-American.     . Total Bilirubin 05/31/2016 0.6  0.2 - 1.2 mg/dL Final  . Alkaline Phosphatase 05/31/2016 165* 40 - 115 U/L Final  . AST 05/31/2016 43* 10 - 35 U/L Final  . ALT 05/31/2016 82* 9 - 46 U/L Final  . Total Protein 05/31/2016 6.4  6.1 - 8.1 g/dL Final  . Albumin 05/31/2016 4.1  3.6 - 5.1 g/dL Final  . Calcium 05/31/2016 9.2  8.6 - 10.3 mg/dL Final  . GFR, Est African American 05/31/2016 59* >=60 mL/min Final  . GFR, Est Non African American 05/31/2016 51* >=60 mL/min Final  . Hgb A1c MFr  Bld 05/31/2016 8.2* <5.7 % Final   Comment:   For someone without known diabetes, a hemoglobin A1c value of 6.5% or greater indicates that they may have diabetes and this should be confirmed with a follow-up test.   For someone with known diabetes, a value <7% indicates that their diabetes is well controlled and a value greater than or equal to 7% indicates suboptimal control. A1c targets should be individualized based on duration of diabetes, age, comorbid conditions, and other considerations.   Currently, no consensus exists for use of hemoglobin A1c for diagnosis of diabetes for children.     . Mean Plasma Glucose 05/31/2016 189  mg/dL Final  . WBC 05/31/2016 6.3  3.8 - 10.8 K/uL Final  . RBC 05/31/2016 4.05* 4.20 - 5.80 MIL/uL Final  . Hemoglobin 05/31/2016 12.6* 13.0 - 17.0 g/dL Final  . HCT 05/31/2016 37.9* 38.5 - 50.0 % Final  . MCV 05/31/2016 93.6  80.0 - 100.0 fL Final  . MCH 05/31/2016 31.1  27.0 - 33.0 pg Final  . MCHC 05/31/2016 33.2  32.0 - 36.0 g/dL Final  . RDW 05/31/2016 14.7  11.0 - 15.0 % Final  . Platelets 05/31/2016 154  140 - 400 K/uL Final  . MPV 05/31/2016 9.0  7.5 -  12.5 fL Final  . Neutro Abs 05/31/2016 3843  1,500 - 7,800 cells/uL Final  . Lymphs Abs 05/31/2016 1638  850 - 3,900 cells/uL Final  . Monocytes Absolute 05/31/2016 567  200 - 950 cells/uL Final  . Eosinophils Absolute 05/31/2016 252  15 - 500 cells/uL Final  . Basophils Absolute 05/31/2016 0  0 - 200 cells/uL Final  . Neutrophils Relative % 05/31/2016 61  % Final  . Lymphocytes Relative 05/31/2016 26  % Final  . Monocytes Relative 05/31/2016 9  % Final  . Eosinophils Relative 05/31/2016 4  % Final  . Basophils Relative 05/31/2016 0  % Final  . Smear Review 05/31/2016 Criteria for review not met   Final  . Cholesterol 05/31/2016 148  <200 mg/dL Final  . Triglycerides 05/31/2016 133  <150 mg/dL Final  . HDL 05/31/2016 46  >40 mg/dL Final  . Total CHOL/HDL Ratio 05/31/2016 3.2  <5.0 Ratio  Final  . VLDL 05/31/2016 27  <30 mg/dL Final  . LDL Cholesterol 05/31/2016 75  <100 mg/dL Final   Past Medical History:  Diagnosis Date  . AAA (abdominal aortic aneurysm) ([place]) 8/12   3.5cm  . CAD (coronary artery disease)   . Colon polyps   . Diabetes mellitus without complication ([place])   . 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 Hyperlipidemia   . Hypertension   . 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 Neuromuscular disorder ([place])    L2-3,L5-S1 bulging disc /nerve impingement  . PAD (peripheral artery disease) ([place])    Past Surgical History:  Procedure Laterality Date  . COLONOSCOPY N/A 09/27/2012   Procedure: COLONOSCOPY;  Surgeon: Daneil Dolin, MD;  Location: [facility];  Service: Endoscopy;  Laterality: N/A;  9:30  . CORONARY ANGIOPLASTY     1989  . TONSILLECTOMY     Current Outpatient Prescriptions on File Prior to Visit  Medication Sig Dispense Refill  . allopurinol (ZYLOPRIM) 300 MG tablet TAKE 1 TABLET EVERY DAY (NEED MD APPOINTMENT) 90 tablet 3  . aspirin 325 MG EC tablet Take 325 mg by mouth daily.    Marland Kitchen atorvastatin (LIPITOR) 80 MG tablet Take 1 tablet (80 mg total) by mouth daily. 90 tablet 3  . diltiazem (CARTIA XT) 240 MG 24 hr capsule Take 1 capsule (240 mg total) by mouth daily. 90 capsule 3  . glipiZIDE (GLUCOTROL) 5 MG tablet TAKE 1 TABLET TWO TIMES DAILY BEFORE A MEAL (Patient taking differently: TAKE 1 TABLET ONCE DAILY BEFORE A MEAL) 180 tablet 3  . glucose blood (TRUETEST TEST) test strip 1 each by Other route as needed for other. Check bs bid  #200 / 11 refills    . hydrochlorothiazide (HYDRODIURIL) 25 MG tablet TAKE 1 TABLET (25 MG TOTAL) BY MOUTH DAILY. 90 tablet 3  . lisinopril (PRINIVIL,ZESTRIL) 40 MG tablet TAKE 1 TABLET EVERY DAY (NEED MD APPOINTMENT) 90 tablet 3  . metFORMIN (GLUCOPHAGE) 500 MG tablet TAKE 1 TABLET TWICE DAILY WITH MEALS (NEED MD APPOINTMENT) 180 tablet 3  . metoprolol  succinate (TOPROL-XL) 25 MG 24 hr tablet TAKE 2 TABLETS EVERY DAY 180 tablet 3  . Multiple Vitamin (MULTIVITAMIN WITH MINERALS) TABS Take 1 tablet by mouth daily.    . TRUEPLUS LANCETS 28G MISC Use as directed to monitor FSBS. DX: 250.00 210 each 3   No current facility-administered medications on file prior to visit.    No Known Allergies Social History  Social History  . Marital status: Married    Spouse name: N/A  . Number of children: N/A  . Years of education: N/A   Occupational History  . retired Retired    Designer, television/film set    Social History Main Topics  . Smoking status: Former Smoker    Years: 35.00    Types: Cigarettes    Quit date: 01/02/1988  . Smokeless tobacco: Never Used  . Alcohol use Yes     Comment: rare beer, no history of ETOH abuse  . Drug use: No  . Sexual activity: Not on file   Other Topics Concern  . Not on file   Social History Narrative  . No narrative on file   Family History  Problem Relation Age of Onset  . Diabetes Mother   . Heart disease Mother     After age 109  . Heart attack Mother   . Hypertension Mother   . Diabetes Father   . Heart disease Father     After age 44  . Hypertension Father   . Heart attack Father   . Brain cancer Sister   . Cancer Sister     Brain  . Diabetes Sister   . Hypertension Sister   . Cancer Brother     Chest Tumor  . Diabetes Brother   . Hypertension Brother   . Deep vein thrombosis Sister   . Diabetes Sister   . Diabetes Brother     Bilateral leg  . Colon cancer Neg Hx       Review of Systems  All other systems reviewed and are negative.      Objective:   Physical Exam  Constitutional: He is oriented to person, place, and time. He appears well-developed and well-nourished. No distress.  HENT:  Head: Normocephalic and atraumatic.  Right Ear: External ear normal.  Left Ear: External ear normal.  Nose: Nose normal.  Mouth/Throat: Oropharynx is clear and moist. No oropharyngeal exudate.    Eyes: Conjunctivae are normal. Pupils are equal, round, and reactive to light. Right eye exhibits no discharge. Left eye exhibits no discharge. No scleral icterus.  Neck: Normal range of motion. Neck supple. No JVD present. No tracheal deviation present. No thyromegaly present.  Cardiovascular: Normal rate, regular rhythm, normal heart sounds and intact distal pulses.  Exam reveals no gallop and no friction rub.   No murmur heard. Pulmonary/Chest: Effort normal and breath sounds normal. No stridor. No respiratory distress. He has no wheezes. He has no rales. He exhibits no tenderness.  Abdominal: Soft. Bowel sounds are normal. He exhibits no distension and no mass. There is no tenderness. There is no rebound and no guarding.  Musculoskeletal: Normal range of motion. He exhibits no edema or tenderness.  Lymphadenopathy:    He has no cervical adenopathy.  Neurological: He is alert and oriented to person, place, and time. He has normal reflexes. No cranial nerve deficit. He exhibits normal muscle tone. Coordination normal.  Skin: Skin is warm. No rash noted. He is not diaphoretic. No erythema. No pallor.  Psychiatric: He has a normal mood and affect. His behavior is normal. Judgment and thought content normal.  Vitals reviewed.         Assessment & Plan:  Controlled type 2 diabetes mellitus with other circulatory complication, without long-term current use of insulin (HCC)  ASCVD (arteriosclerotic cardiovascular disease)  Pure hypercholesterolemia  Benign essential HTN  Decrease Lipitor to 40 mg a day due to elevated liver function test  and recheck liver function tests in 2 weeks. Patient will aggressively pursue dietary changes. Discontinue glipizide and replaced with Jardiance 25 mg a day due to cardiovascular mortality reduction particularly given his known carotid artery stenosis. Recheck all labs in 3 months. In 2 weeks he will return for a CMP to monitor his liver function tests

## 2016-06-14 ENCOUNTER — Other Ambulatory Visit: Payer: Medicare HMO

## 2016-06-14 DIAGNOSIS — E785 Hyperlipidemia, unspecified: Secondary | ICD-10-CM

## 2016-06-14 DIAGNOSIS — E119 Type 2 diabetes mellitus without complications: Secondary | ICD-10-CM

## 2016-06-14 DIAGNOSIS — R7989 Other specified abnormal findings of blood chemistry: Secondary | ICD-10-CM

## 2016-06-14 DIAGNOSIS — R945 Abnormal results of liver function studies: Principal | ICD-10-CM

## 2016-06-15 LAB — COMPLETE METABOLIC PANEL WITH GFR
ALBUMIN: 3.9 g/dL (ref 3.6–5.1)
ALK PHOS: 335 U/L — AB (ref 40–115)
ALT: 211 U/L — AB (ref 9–46)
AST: 134 U/L — AB (ref 10–35)
BUN: 42 mg/dL — AB (ref 7–25)
CALCIUM: 9.4 mg/dL (ref 8.6–10.3)
CHLORIDE: 102 mmol/L (ref 98–110)
CO2: 22 mmol/L (ref 20–31)
CREATININE: 2.03 mg/dL — AB (ref 0.70–1.18)
GFR, Est African American: 35 mL/min — ABNORMAL LOW (ref >=60)
GFR, Est Non African American: 30 mL/min — ABNORMAL LOW (ref >=60)
Glucose, Bld: 235 mg/dL — ABNORMAL HIGH (ref 70–99)
Potassium: 4.5 mmol/L (ref 3.5–5.3)
SODIUM: 137 mmol/L (ref 135–146)
Total Bilirubin: 0.5 mg/dL (ref 0.2–1.2)
Total Protein: 6.5 g/dL (ref 6.1–8.1)

## 2016-06-16 ENCOUNTER — Telehealth: Payer: Self-pay | Admitting: Family Medicine

## 2016-06-16 ENCOUNTER — Ambulatory Visit (HOSPITAL_COMMUNITY)
Admission: RE | Admit: 2016-06-16 | Discharge: 2016-06-16 | Disposition: A | Discharge status: Home / Self Care | Payer: Medicare HMO | Source: Ambulatory Visit | Attending: Family Medicine | Admitting: Family Medicine

## 2016-06-16 DIAGNOSIS — R945 Abnormal results of liver function studies: Secondary | ICD-10-CM

## 2016-06-16 DIAGNOSIS — R7989 Other specified abnormal findings of blood chemistry: Secondary | ICD-10-CM | POA: Insufficient documentation

## 2016-06-16 NOTE — Telephone Encounter (Signed)
Spoke to pt about lab results.  Told to stop taking Atorvastatin, metformin and Jardiance until further notice.  Abd [place] order placed and pt having done today at [facility] 230 PM

## 2016-06-16 NOTE — Telephone Encounter (Signed)
-----   Message from Susy Frizzle, MD sent at 06/15/2016  6:55 AM EDT ----- LFT are much worse.  STop lipitor and get RUQ [place] ASAP.  Renal fcn is also worse.  Hold metformin and jardiance and await the results of ruq [place].

## 2016-06-20 ENCOUNTER — Other Ambulatory Visit: Payer: Self-pay | Admitting: Family Medicine

## 2016-06-20 DIAGNOSIS — R945 Abnormal results of liver function studies: Principal | ICD-10-CM

## 2016-06-20 DIAGNOSIS — R7989 Other specified abnormal findings of blood chemistry: Secondary | ICD-10-CM

## 2016-06-23 ENCOUNTER — Encounter: Payer: Self-pay | Admitting: Family Medicine

## 2016-06-23 ENCOUNTER — Ambulatory Visit (INDEPENDENT_AMBULATORY_CARE_PROVIDER_SITE_OTHER): Payer: Medicare HMO | Admitting: Family Medicine

## 2016-06-23 ENCOUNTER — Other Ambulatory Visit: Payer: Medicare HMO

## 2016-06-23 VITALS — BP 132/50 | HR 78 | Temp 98.6°F | Resp 18 | Ht 70.5 in | Wt 211.0 lb

## 2016-06-23 DIAGNOSIS — N289 Disorder of kidney and ureter, unspecified: Secondary | ICD-10-CM

## 2016-06-23 DIAGNOSIS — R945 Abnormal results of liver function studies: Principal | ICD-10-CM

## 2016-06-23 DIAGNOSIS — R7989 Other specified abnormal findings of blood chemistry: Secondary | ICD-10-CM

## 2016-06-23 DIAGNOSIS — J209 Acute bronchitis, unspecified: Secondary | ICD-10-CM

## 2016-06-23 LAB — COMPLETE METABOLIC PANEL WITH GFR
ALBUMIN: 3.6 g/dL (ref 3.6–5.1)
ALT: 97 U/L — ABNORMAL HIGH (ref 9–46)
AST: 42 U/L — AB (ref 10–35)
Alkaline Phosphatase: 284 U/L — ABNORMAL HIGH (ref 40–115)
BUN: 37 mg/dL — AB (ref 7–25)
CALCIUM: 8.9 mg/dL (ref 8.6–10.3)
CO2: 20 mmol/L (ref 20–31)
CREATININE: 1.75 mg/dL — AB (ref 0.70–1.18)
Chloride: 101 mmol/L (ref 98–110)
GFR, Est African American: 42 mL/min — ABNORMAL LOW (ref >=60)
GFR, Est Non African American: 36 mL/min — ABNORMAL LOW (ref >=60)
Glucose, Bld: 316 mg/dL — ABNORMAL HIGH (ref 70–99)
POTASSIUM: 4.8 mmol/L (ref 3.5–5.3)
Sodium: 133 mmol/L — ABNORMAL LOW (ref 135–146)
Total Bilirubin: 0.6 mg/dL (ref 0.2–1.2)
Total Protein: 6.4 g/dL (ref 6.1–8.1)

## 2016-06-23 MED ORDER — AZITHROMYCIN 250 MG PO TABS: ORAL_TABLET | ORAL | 0 refills | Status: DC

## 2016-06-23 MED ORDER — GLIPIZIDE 5 MG PO TABS: ORAL_TABLET | ORAL | 3 refills | Status: DC

## 2016-06-23 NOTE — Progress Notes (Signed)
Subjective:    Patient ID: Juan [name], male    DOB: [birth year], 76 y.o.   MRN: [id number]  Hyperlipidemia   Diabetes   06/02/16 At the patient's last visit, he was found to have bilateral carotid bruits. Carotid Doppler showed bilateral carotid artery stenosis most severe on the right with 50-69% stenosis.  Less than 50% on the left.  Lipitor was increased to 80 mg a day and vascular surgery was consulted.  Saw Dr. Oneida Alar who recommended recheck in 6 months.  During that 6 month period of time, the patient admits that his diet has been poor. He is very much eating whatever he wants. As result his LDL cholesterol has actually worsened even though he is on a higher dose of Lipitor. His hemoglobin A1c has risen from 6.3 to greater than 8. His liver tests have also become slightly inflamed suggesting fatty liver disease versus hepatitis secondary to the statin medication. At that time, my plan was: Decrease Lipitor to 40 mg a day due to elevated liver function test and recheck liver function tests in 2 weeks. Patient will aggressively pursue dietary changes. Discontinue glipizide and replaced with Jardiance 25 mg a day due to cardiovascular mortality reduction particularly given his known carotid artery stenosis. Recheck all labs in 3 months. In 2 weeks he will return for a CMP to monitor his liver function tests  06/23/16 Unfortunately on the repeat lab work, the patient's creatinine had increased to greater than 2 indicating acute on chronic renal insufficiency. His liver function test were over 102 100 respectively: No visits with results within 1 Week(s) from this visit.  Latest known visit with results is:  Appointment on 06/14/2016  Component Date Value Ref Range Status  . Sodium 06/14/2016 137  135 - 146 mmol/L Final  . Potassium 06/14/2016 4.5  3.5 - 5.3 mmol/L Final  . Chloride 06/14/2016 102  98 - 110 mmol/L Final  . CO2 06/14/2016 22  20 - 31 mmol/L Final  . Glucose, Bld 06/14/2016  235* 70 - 99 mg/dL Final  . BUN 06/14/2016 42* 7 - 25 mg/dL Final  . Creat 06/14/2016 2.03* 0.70 - 1.18 mg/dL Final   Comment:   For patients > or = 78 years of age: The upper reference limit for Creatinine is approximately 13% higher for people identified as African-American.     . Total Bilirubin 06/14/2016 0.5  0.2 - 1.2 mg/dL Final  . Alkaline Phosphatase 06/14/2016 335* 40 - 115 U/L Final  . AST 06/14/2016 134* 10 - 35 U/L Final  . ALT 06/14/2016 211* 9 - 46 U/L Final  . Total Protein 06/14/2016 6.5  6.1 - 8.1 g/dL Final  . Albumin 06/14/2016 3.9  3.6 - 5.1 g/dL Final  . Calcium 06/14/2016 9.4  8.6 - 10.3 mg/dL Final  . GFR, Est African American 06/14/2016 35* >=60 mL/min Final  . GFR, Est Non African American 06/14/2016 30* >=60 mL/min Final   Right upper quadrant ultrasound was obtained which revealed a right renal cyst and no abnormality in the liver. It was at that time the decision was made to discontinue Lipitor, hold metformin and Giardia and skin of the renal insufficiency, and recheck lab work in one week to see if lab abnormalities will improve. Over that time, the patient reports worsening fatigue. He is also developed a cough productive of green sputum, chest congestion, subjective fevers. Patient has lost 14 pounds over the last 2-1/2 weeks although he says that he has tried  to change his diet and exercise more. Patient appears very tired. He is in no acute distress. He does appear sickly Past Medical History:  Diagnosis Date  . AAA (abdominal aortic aneurysm) ([place]) 8/12   3.5cm  . CAD (coronary artery disease)   . Colon polyps   . Diabetes mellitus without complication ([place])   . 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 Hyperlipidemia   . Hypertension   . 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 Neuromuscular disorder ([place])    L2-3,L5-S1 bulging disc /nerve impingement  . PAD (peripheral artery disease) ([place])     Past Surgical History:  Procedure Laterality Date  . COLONOSCOPY N/A 09/27/2012   Procedure: COLONOSCOPY;  Surgeon: Daneil Dolin, MD;  Location: [facility];  Service: Endoscopy;  Laterality: N/A;  9:30  . CORONARY ANGIOPLASTY     1989  . TONSILLECTOMY      Current Outpatient Prescriptions on File Prior to Visit  Medication Sig Dispense Refill  . allopurinol (ZYLOPRIM) 300 MG tablet TAKE 1 TABLET EVERY DAY (NEED MD APPOINTMENT) 90 tablet 3  . aspirin 325 MG EC tablet Take 325 mg by mouth daily.    Marland Kitchen diltiazem (CARTIA XT) 240 MG 24 hr capsule Take 1 capsule (240 mg total) by mouth daily. 90 capsule 3  . glucose blood (TRUETEST TEST) test strip 1 each by Other route as needed for other. Check bs bid  #200 / 11 refills    . hydrochlorothiazide (HYDRODIURIL) 25 MG tablet TAKE 1 TABLET (25 MG TOTAL) BY MOUTH DAILY. 90 tablet 3  . lisinopril (PRINIVIL,ZESTRIL) 40 MG tablet TAKE 1 TABLET EVERY DAY (NEED MD APPOINTMENT) 90 tablet 3  . metoprolol succinate (TOPROL-XL) 25 MG 24 hr tablet TAKE 2 TABLETS EVERY DAY 180 tablet 3  . Multiple Vitamin (MULTIVITAMIN WITH MINERALS) TABS Take 1 tablet by mouth daily.    . TRUEPLUS LANCETS 28G MISC Use as directed to monitor FSBS. DX: 250.00 210 each 3  . atorvastatin (LIPITOR) 80 MG tablet Take 1 tablet (80 mg total) by mouth daily. (Patient not taking: Reported on 06/23/2016) 90 tablet 3  . empagliflozin (JARDIANCE) 25 MG TABS tablet Take 25 mg by mouth daily. (Patient not taking: Reported on 06/23/2016) 30 tablet 5  . metFORMIN (GLUCOPHAGE) 500 MG tablet TAKE 1 TABLET TWICE DAILY WITH MEALS (NEED MD APPOINTMENT) (Patient not taking: Reported on 06/23/2016) 180 tablet 3   No current facility-administered medications on file prior to visit.    No Known Allergies Social History   Social History  . Marital status: Married    Spouse name: N/A  . Number of children: N/A  . Years of education: N/A   Occupational History  . retired Retired     Designer, television/film set    Social History Main Topics  . Smoking status: Former Smoker    Years: 35.00    Types: Cigarettes    Quit date: 01/02/1988  . Smokeless tobacco: Never Used  . Alcohol use Yes     Comment: rare beer, no history of ETOH abuse  . Drug use: No  . Sexual activity: Not on file   Other Topics Concern  . Not on file   Social History Narrative  . No narrative on file   Family History  Problem Relation Age of Onset  . Diabetes Mother   . Heart disease Mother     After age 57  . Heart attack  Mother   . Hypertension Mother   . Diabetes Father   . Heart disease Father     After age 28  . Hypertension Father   . Heart attack Father   . Brain cancer Sister   . Cancer Sister     Brain  . Diabetes Sister   . Hypertension Sister   . Cancer Brother     Chest Tumor  . Diabetes Brother   . Hypertension Brother   . Deep vein thrombosis Sister   . Diabetes Sister   . Diabetes Brother     Bilateral leg  . Colon cancer Neg Hx       Review of Systems  All other systems reviewed and are negative.      Objective:   Physical Exam  Constitutional: He is oriented to person, place, and time. He appears well-developed and well-nourished. He has a sickly appearance. No distress.  HENT:  Head: Normocephalic and atraumatic.  Right Ear: External ear normal.  Left Ear: External ear normal.  Nose: Mucosal edema and rhinorrhea present.  Mouth/Throat: Oropharynx is clear and moist. No oropharyngeal exudate.  Eyes: Conjunctivae are normal. Pupils are equal, round, and reactive to light. Right eye exhibits no discharge. Left eye exhibits no discharge. No scleral icterus.  Neck: Normal range of motion. Neck supple. No JVD present. No tracheal deviation present. No thyromegaly present.  Cardiovascular: Normal rate, regular rhythm, normal heart sounds and intact distal pulses.  Exam reveals no gallop and no friction rub.   No murmur heard. Pulmonary/Chest: Effort normal and breath  sounds normal. No stridor. No respiratory distress. He has no wheezes. He has no rales. He exhibits no tenderness.  Abdominal: Soft. Bowel sounds are normal. He exhibits no distension and no mass. There is no tenderness. There is no rebound and no guarding.  Musculoskeletal: Normal range of motion. He exhibits no edema or tenderness.  Lymphadenopathy:    He has no cervical adenopathy.  Neurological: He is alert and oriented to person, place, and time. He has normal reflexes. No cranial nerve deficit. He exhibits normal muscle tone. Coordination normal.  Skin: Skin is warm. No rash noted. He is not diaphoretic. No erythema. No pallor.  Psychiatric: He has a normal mood and affect. His behavior is normal. Judgment and thought content normal.  Vitals reviewed.         Assessment & Plan:  Elevated LFTs  Acute renal insufficiency  Acute bronchitis, unspecified organism  I'm very concerned about the patient's worsening liver function test, worsening renal function, and 14 pound weight loss. Right quadrant ultrasound was reassuring however there is no explanation yet to the patient's problem. Repeat CMP today now that the patient's off Lipitor, metformin, and Giardia. Treat the patient for possible bronchitis sinusitis with a Z-Pak over the weekend. Recheck on Monday. Resume glipizide 5 mg by mouth twice a day as the patient's blood sugars are over 300 off his medication. If liver function tests continue to worsen and renal function tests continue to worsen, the patient will need an SPEP/UPEP, renal ultrasound is unnecessary because right upper quadrant ultrasound revealed no hydronephrosis, however I would perform a urinalysis, screen the patient for autoimmune hepatitis.  Given his age I do not believe the hemochromatosis is likely or Wilson's disease. Would consider screen the patient for viral hepatitis. Push fluids over the weekend. Recheck on Monday

## 2016-06-26 ENCOUNTER — Ambulatory Visit (INDEPENDENT_AMBULATORY_CARE_PROVIDER_SITE_OTHER): Payer: Medicare HMO | Admitting: Family Medicine

## 2016-06-26 ENCOUNTER — Encounter: Payer: Self-pay | Admitting: Family Medicine

## 2016-06-26 VITALS — BP 120/56 | HR 84 | Temp 98.0°F | Resp 16 | Ht 70.5 in | Wt 214.0 lb

## 2016-06-26 DIAGNOSIS — E1165 Type 2 diabetes mellitus with hyperglycemia: Secondary | ICD-10-CM | POA: Diagnosis not present

## 2016-06-26 DIAGNOSIS — R7989 Other specified abnormal findings of blood chemistry: Secondary | ICD-10-CM | POA: Diagnosis not present

## 2016-06-26 DIAGNOSIS — N289 Disorder of kidney and ureter, unspecified: Secondary | ICD-10-CM

## 2016-06-26 DIAGNOSIS — R945 Abnormal results of liver function studies: Principal | ICD-10-CM

## 2016-06-26 MED ORDER — BENZONATATE 100 MG PO CAPS: 200.0000 mg | ORAL_CAPSULE | Freq: Three times a day (TID) | ORAL | 0 refills | Status: DC | PRN

## 2016-06-26 NOTE — Progress Notes (Signed)
Subjective:    Patient ID: Juan [name], male    DOB: 1940-10-15, 76 y.o.   MRN: [id number]  Hyperlipidemia   Diabetes   06/02/16 At the patient's last visit, he was found to have bilateral carotid bruits. Carotid Doppler showed bilateral carotid artery stenosis most severe on the right with 50-69% stenosis.  Less than 50% on the left.  Lipitor was increased to 80 mg a day and vascular surgery was consulted.  Saw Dr. Oneida Alar who recommended recheck in 6 months.  During that 6 month period of time, the patient admits that his diet has been poor. He is very much eating whatever he wants. As result his LDL cholesterol has actually worsened even though he is on a higher dose of Lipitor. His hemoglobin A1c has risen from 6.3 to greater than 8. His liver tests have also become slightly inflamed suggesting fatty liver disease versus hepatitis secondary to the statin medication. At that time, my plan was: Decrease Lipitor to 40 mg a day due to elevated liver function test and recheck liver function tests in 2 weeks. Patient will aggressively pursue dietary changes. Discontinue glipizide and replaced with Jardiance 25 mg a day due to cardiovascular mortality reduction particularly given his known carotid artery stenosis. Recheck all labs in 3 months. In 2 weeks he will return for a CMP to monitor his liver function tests  06/23/16 Unfortunately on the repeat lab work, the patient's creatinine had increased to greater than 2 indicating acute on chronic renal insufficiency. His liver function test were over 200 and 100 respectfully.   Right upper quadrant ultrasound was obtained which revealed a right renal cyst and no abnormality in the liver. It was at that time the decision was made to discontinue Lipitor, hold metformin and Giardia and skin of the renal insufficiency, and recheck lab work in one week to see if lab abnormalities will improve. Over that time, the patient reports worsening fatigue. He is also  developed a cough productive of green sputum, chest congestion, subjective fevers. Patient has lost 14 pounds over the last 2-1/2 weeks although he says that he has tried to change his diet and exercise more. Patient appears very tired. He is in no acute distress. He does appear sickly.  AT that time, my plan was: I'm very concerned about the patient's worsening liver function test, worsening renal function, and 14 pound weight loss. Right quadrant ultrasound was reassuring however there is no explanation yet to the patient's problem. Repeat CMP today now that the patient's off Lipitor, metformin, and Giardia. Treat the patient for possible bronchitis sinusitis with a Z-Pak over the weekend. Recheck on Monday. Resume glipizide 5 mg by mouth twice a day as the patient's blood sugars are over 300 off his medication. If liver function tests continue to worsen and renal function tests continue to worsen, the patient will need an SPEP/UPEP, renal ultrasound is unnecessary because right upper quadrant ultrasound revealed no hydronephrosis, however I would perform a urinalysis, screen the patient for autoimmune hepatitis.  Given his age I do not believe the hemochromatosis is likely or Wilson's disease. Would consider screen the patient for viral hepatitis. Push fluids over the weekend. Recheck on Monday.  06/26/16 Patient states that he feels much better. He continues to have postnasal drip causing a cough and some sinus pressure and sinus drainage. The lab work obtained last week revealed: Appointment on 06/23/2016  Component Date Value Ref Range Status  . Sodium 06/23/2016 133* 135 - 146  mmol/L Final  . Potassium 06/23/2016 4.8  3.5 - 5.3 mmol/L Final  . Chloride 06/23/2016 101  98 - 110 mmol/L Final  . CO2 06/23/2016 20  20 - 31 mmol/L Final  . Glucose, Bld 06/23/2016 316* 70 - 99 mg/dL Final  . BUN 06/23/2016 37* 7 - 25 mg/dL Final  . Creat 06/23/2016 1.75* 0.70 - 1.18 mg/dL Final   Comment:   For  patients > or = 78 years of age: The upper reference limit for Creatinine is approximately 13% higher for people identified as African-American.     . Total Bilirubin 06/23/2016 0.6  0.2 - 1.2 mg/dL Final  . Alkaline Phosphatase 06/23/2016 284* 40 - 115 U/L Final  . AST 06/23/2016 42* 10 - 35 U/L Final  . ALT 06/23/2016 97* 9 - 46 U/L Final  . Total Protein 06/23/2016 6.4  6.1 - 8.1 g/dL Final  . Albumin 06/23/2016 3.6  3.6 - 5.1 g/dL Final  . Calcium 06/23/2016 8.9  8.6 - 10.3 mg/dL Final  . GFR, Est African American 06/23/2016 42* >=60 mL/min Final  . GFR, Est Non African American 06/23/2016 36* >=60 mL/min Final   . ALT dropped more than 100 points and AST dropped by 50% off Lipitor. Creatinine also improved after holding Jardiance and metformin.  Blood pressure remains low. He also continues to have hyperglycemia. Blood sugars last week were over 300. Since starting glipizide they're now down to 250 this morning however he's only been on the medication for 48 hours Past Medical History:  Diagnosis Date  . AAA (abdominal aortic aneurysm) ([place]) 8/12   3.5cm  . CAD (coronary artery disease)   . Colon polyps   . Diabetes mellitus without complication ([place])   . 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 Hyperlipidemia   . Hypertension   . 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 Neuromuscular disorder ([place])    L2-3,L5-S1 bulging disc /nerve impingement  . PAD (peripheral artery disease) ([place])    Past Surgical History:  Procedure Laterality Date  . COLONOSCOPY N/A 09/27/2012   Procedure: COLONOSCOPY;  Surgeon: Daneil Dolin, MD;  Location: [facility];  Service: Endoscopy;  Laterality: N/A;  9:30  . CORONARY ANGIOPLASTY     1989  . TONSILLECTOMY      Current Outpatient Prescriptions on File Prior to Visit  Medication Sig Dispense Refill  . allopurinol (ZYLOPRIM) 300 MG tablet TAKE 1 TABLET EVERY DAY (NEED MD APPOINTMENT)  90 tablet 3  . aspirin 325 MG EC tablet Take 325 mg by mouth daily.    Marland Kitchen azithromycin (ZITHROMAX) 250 MG tablet 2 tabs poqday1, 1 tab poqday 2-5 6 tablet 0  . diltiazem (CARTIA XT) 240 MG 24 hr capsule Take 1 capsule (240 mg total) by mouth daily. 90 capsule 3  . glipiZIDE (GLUCOTROL) 5 MG tablet TAKE 1 TABLET TWO TIMES DAILY BEFORE A MEAL 60 tablet 3  . glucose blood (TRUETEST TEST) test strip 1 each by Other route as needed for other. Check bs bid  #200 / 11 refills    . hydrochlorothiazide (HYDRODIURIL) 25 MG tablet TAKE 1 TABLET (25 MG TOTAL) BY MOUTH DAILY. 90 tablet 3  . lisinopril (PRINIVIL,ZESTRIL) 40 MG tablet TAKE 1 TABLET EVERY DAY (NEED MD APPOINTMENT) 90 tablet 3  . metoprolol succinate (TOPROL-XL) 25 MG 24 hr tablet TAKE 2 TABLETS EVERY DAY 180 tablet 3  . Multiple Vitamin (MULTIVITAMIN WITH  MINERALS) TABS Take 1 tablet by mouth daily.    . TRUEPLUS LANCETS 28G MISC Use as directed to monitor FSBS. DX: 250.00 210 each 3  . atorvastatin (LIPITOR) 80 MG tablet Take 1 tablet (80 mg total) by mouth daily. (Patient not taking: Reported on 06/23/2016) 90 tablet 3  . empagliflozin (JARDIANCE) 25 MG TABS tablet Take 25 mg by mouth daily. (Patient not taking: Reported on 06/23/2016) 30 tablet 5  . metFORMIN (GLUCOPHAGE) 500 MG tablet TAKE 1 TABLET TWICE DAILY WITH MEALS (NEED MD APPOINTMENT) (Patient not taking: Reported on 06/23/2016) 180 tablet 3   No current facility-administered medications on file prior to visit.    No Known Allergies Social History   Social History  . Marital status: Married    Spouse name: N/A  . Number of children: N/A  . Years of education: N/A   Occupational History  . retired Retired    Designer, television/film set    Social History Main Topics  . Smoking status: Former Smoker    Years: 35.00    Types: Cigarettes    Quit date: 01/02/1988  . Smokeless tobacco: Never Used  . Alcohol use Yes     Comment: rare beer, no history of ETOH abuse  . Drug use: No  . Sexual  activity: Not on file   Other Topics Concern  . Not on file   Social History Narrative  . No narrative on file   Family History  Problem Relation Age of Onset  . Diabetes Mother   . Heart disease Mother     After age 16  . Heart attack Mother   . Hypertension Mother   . Diabetes Father   . Heart disease Father     After age 42  . Hypertension Father   . Heart attack Father   . Brain cancer Sister   . Cancer Sister     Brain  . Diabetes Sister   . Hypertension Sister   . Cancer Brother     Chest Tumor  . Diabetes Brother   . Hypertension Brother   . Deep vein thrombosis Sister   . Diabetes Sister   . Diabetes Brother     Bilateral leg  . Colon cancer Neg Hx       Review of Systems  All other systems reviewed and are negative.      Objective:   Physical Exam  Constitutional: He is oriented to person, place, and time. He appears well-developed and well-nourished. He has a sickly appearance. No distress.  HENT:  Head: Normocephalic and atraumatic.  Right Ear: External ear normal.  Left Ear: External ear normal.  Nose: Mucosal edema and rhinorrhea present.  Mouth/Throat: Oropharynx is clear and moist. No oropharyngeal exudate.  Eyes: Conjunctivae are normal. Pupils are equal, round, and reactive to light. Right eye exhibits no discharge. Left eye exhibits no discharge. No scleral icterus.  Neck: Normal range of motion. Neck supple. No JVD present. No tracheal deviation present. No thyromegaly present.  Cardiovascular: Normal rate, regular rhythm, normal heart sounds and intact distal pulses.  Exam reveals no gallop and no friction rub.   No murmur heard. Pulmonary/Chest: Effort normal and breath sounds normal. No stridor. No respiratory distress. He has no wheezes. He has no rales. He exhibits no tenderness.  Abdominal: Soft. Bowel sounds are normal. He exhibits no distension and no mass. There is no tenderness. There is no rebound and no guarding.    Musculoskeletal: Normal range of motion. He exhibits no  edema or tenderness.  Lymphadenopathy:    He has no cervical adenopathy.  Neurological: He is alert and oriented to person, place, and time. He has normal reflexes. No cranial nerve deficit. He exhibits normal muscle tone. Coordination normal.  Skin: Skin is warm. No rash noted. He is not diaphoretic. No erythema. No pallor.  Psychiatric: He has a normal mood and affect. His behavior is normal. Judgment and thought content normal.  Vitals reviewed.         Assessment & Plan:  Elevated LFTs  Acute renal insufficiency  Type 2 diabetes mellitus with hyperglycemia, without long-term current use of insulin ([place])  Patient seems to be improving. Recheck CMP in one week. Hold hydrochlorothiazide given the acute renal insufficiency. If in one week, the patient's creatinine is back to normal, I will resume metformin. Hopefully the combination of metformin and glipizide will control his hyperglycemia. If the patient's liver function tests have not improved at that point I will discontinue allopurinol. If they have improved at that point, we will need to make a decision about when to resume a statin and at what dose.

## 2016-07-03 ENCOUNTER — Other Ambulatory Visit: Payer: Medicare HMO

## 2016-07-03 DIAGNOSIS — E119 Type 2 diabetes mellitus without complications: Secondary | ICD-10-CM | POA: Diagnosis not present

## 2016-07-03 DIAGNOSIS — E785 Hyperlipidemia, unspecified: Secondary | ICD-10-CM | POA: Diagnosis not present

## 2016-07-03 LAB — COMPLETE METABOLIC PANEL WITH GFR
ALBUMIN: 3.7 g/dL (ref 3.6–5.1)
ALK PHOS: 158 U/L — AB (ref 40–115)
ALT: 35 U/L (ref 9–46)
AST: 20 U/L (ref 10–35)
BILIRUBIN TOTAL: 0.3 mg/dL (ref 0.2–1.2)
BUN: 18 mg/dL (ref 7–25)
CO2: 25 mmol/L (ref 20–31)
Calcium: 9.3 mg/dL (ref 8.6–10.3)
Chloride: 104 mmol/L (ref 98–110)
Creat: 1.46 mg/dL — ABNORMAL HIGH (ref 0.70–1.18)
GFR, EST AFRICAN AMERICAN: 53 mL/min — AB (ref >=60)
GFR, EST NON AFRICAN AMERICAN: 45 mL/min — AB (ref >=60)
GLUCOSE: 206 mg/dL — AB (ref 70–99)
Potassium: 4.8 mmol/L (ref 3.5–5.3)
SODIUM: 138 mmol/L (ref 135–146)
TOTAL PROTEIN: 6.6 g/dL (ref 6.1–8.1)

## 2016-07-03 LAB — CBC WITH DIFFERENTIAL/PLATELET
BASOS PCT: 0 %
Basophils Absolute: 0 cells/uL (ref 0–200)
EOS PCT: 3 %
Eosinophils Absolute: 216 cells/uL (ref 15–500)
HCT: 34.4 % — ABNORMAL LOW (ref 38.5–50.0)
Hemoglobin: 11.3 g/dL — ABNORMAL LOW (ref 13.0–17.0)
LYMPHS PCT: 23 %
Lymphs Abs: 1656 cells/uL (ref 850–3900)
MCH: 30.9 pg (ref 27.0–33.0)
MCHC: 32.8 g/dL (ref 32.0–36.0)
MCV: 94 fL (ref 80.0–100.0)
MONOS PCT: 8 %
MPV: 8.8 fL (ref 7.5–12.5)
Monocytes Absolute: 576 cells/uL (ref 200–950)
NEUTROS PCT: 66 %
Neutro Abs: 4752 cells/uL (ref 1500–7800)
PLATELETS: 209 10*3/uL (ref 140–400)
RBC: 3.66 MIL/uL — AB (ref 4.20–5.80)
RDW: 14.2 % (ref 11.0–15.0)
WBC: 7.2 10*3/uL (ref 3.8–10.8)

## 2016-07-03 LAB — LIPID PANEL
CHOLESTEROL: 213 mg/dL — AB (ref <200)
HDL: 37 mg/dL — AB (ref >40)
LDL CALC: 133 mg/dL — AB (ref <100)
TRIGLYCERIDES: 216 mg/dL — AB (ref <150)
Total CHOL/HDL Ratio: 5.8 Ratio — ABNORMAL HIGH (ref <5.0)
VLDL: 43 mg/dL — ABNORMAL HIGH (ref <30)

## 2016-07-04 ENCOUNTER — Encounter: Payer: Self-pay | Admitting: Family Medicine

## 2016-07-04 ENCOUNTER — Ambulatory Visit (INDEPENDENT_AMBULATORY_CARE_PROVIDER_SITE_OTHER): Payer: Medicare HMO | Admitting: Family Medicine

## 2016-07-04 VITALS — BP 122/60 | HR 80 | Temp 97.7°F | Resp 16 | Ht 70.5 in | Wt 221.0 lb

## 2016-07-04 DIAGNOSIS — R945 Abnormal results of liver function studies: Principal | ICD-10-CM

## 2016-07-04 DIAGNOSIS — N289 Disorder of kidney and ureter, unspecified: Secondary | ICD-10-CM | POA: Diagnosis not present

## 2016-07-04 DIAGNOSIS — R7989 Other specified abnormal findings of blood chemistry: Secondary | ICD-10-CM

## 2016-07-04 DIAGNOSIS — E1165 Type 2 diabetes mellitus with hyperglycemia: Secondary | ICD-10-CM | POA: Diagnosis not present

## 2016-07-04 DIAGNOSIS — I2510 Atherosclerotic heart disease of native coronary artery without angina pectoris: Secondary | ICD-10-CM

## 2016-07-04 LAB — HEMOGLOBIN A1C
Hgb A1c MFr Bld: 9.2 % — ABNORMAL HIGH (ref <5.7)
MEAN PLASMA GLUCOSE: 217 mg/dL

## 2016-07-04 MED ORDER — GLIPIZIDE 5 MG PO TABS: ORAL_TABLET | ORAL | 3 refills | Status: DC

## 2016-07-04 MED ORDER — ATORVASTATIN CALCIUM 20 MG PO TABS: 20.0000 mg | ORAL_TABLET | Freq: Every day | ORAL | 3 refills | Status: DC

## 2016-07-04 NOTE — Progress Notes (Signed)
Subjective:    Patient ID: Juan [name], male    DOB: 1940/12/27, 76 y.o.   MRN: [id number]  Hyperlipidemia   Diabetes   06/02/16 At the patient's last visit, he was found to have bilateral carotid bruits. Carotid Doppler showed bilateral carotid artery stenosis most severe on the right with 50-69% stenosis.  Less than 50% on the left.  Lipitor was increased to 80 mg a day and vascular surgery was consulted.  Saw Dr. Oneida Alar who recommended recheck in 6 months.  During that 6 month period of time, the patient admits that his diet has been poor. He is very much eating whatever he wants. As result his LDL cholesterol has actually worsened even though he is on a higher dose of Lipitor. His hemoglobin A1c has risen from 6.3 to greater than 8. His liver tests have also become slightly inflamed suggesting fatty liver disease versus hepatitis secondary to the statin medication. At that time, my plan was: Decrease Lipitor to 40 mg a day due to elevated liver function test and recheck liver function tests in 2 weeks. Patient will aggressively pursue dietary changes. Discontinue glipizide and replaced with Jardiance 25 mg a day due to cardiovascular mortality reduction particularly given his known carotid artery stenosis. Recheck all labs in 3 months. In 2 weeks he will return for a CMP to monitor his liver function tests  06/23/16 Unfortunately on the repeat lab work, the patient's creatinine had increased to greater than 2 indicating acute on chronic renal insufficiency. His liver function test were over 200 and 100 respectfully.   Right upper quadrant ultrasound was obtained which revealed a right renal cyst and no abnormality in the liver. It was at that time the decision was made to discontinue Lipitor, hold metformin and Giardia and skin of the renal insufficiency, and recheck lab work in one week to see if lab abnormalities will improve. Over that time, the patient reports worsening fatigue. He is also  developed a cough productive of green sputum, chest congestion, subjective fevers. Patient has lost 14 pounds over the last 2-1/2 weeks although he says that he has tried to change his diet and exercise more. Patient appears very tired. He is in no acute distress. He does appear sickly.  AT that time, my plan was: I'm very concerned about the patient's worsening liver function test, worsening renal function, and 14 pound weight loss. Right quadrant ultrasound was reassuring however there is no explanation yet to the patient's problem. Repeat CMP today now that the patient's off Lipitor, metformin, and Giardia. Treat the patient for possible bronchitis sinusitis with a Z-Pak over the weekend. Recheck on Monday. Resume glipizide 5 mg by mouth twice a day as the patient's blood sugars are over 300 off his medication. If liver function tests continue to worsen and renal function tests continue to worsen, the patient will need an SPEP/UPEP, renal ultrasound is unnecessary because right upper quadrant ultrasound revealed no hydronephrosis, however I would perform a urinalysis, screen the patient for autoimmune hepatitis.  Given his age I do not believe the hemochromatosis is likely or Wilson's disease. Would consider screen the patient for viral hepatitis. Push fluids over the weekend. Recheck on Monday.  06/26/16 Patient states that he feels much better. He continues to have postnasal drip causing a cough and some sinus pressure and sinus drainage. The lab work obtained last week revealed: Appointment on 07/03/2016  Component Date Value Ref Range Status  . Sodium 07/03/2016 138  135 -  146 mmol/L Final  . Potassium 07/03/2016 4.8  3.5 - 5.3 mmol/L Final  . Chloride 07/03/2016 104  98 - 110 mmol/L Final  . CO2 07/03/2016 25  20 - 31 mmol/L Final  . Glucose, Bld 07/03/2016 206* 70 - 99 mg/dL Final  . BUN 07/03/2016 18  7 - 25 mg/dL Final  . Creat 07/03/2016 1.46* 0.70 - 1.18 mg/dL Final   Comment:   For  patients > or = 78 years of age: The upper reference limit for Creatinine is approximately 13% higher for people identified as African-American.     . Total Bilirubin 07/03/2016 0.3  0.2 - 1.2 mg/dL Final  . Alkaline Phosphatase 07/03/2016 158* 40 - 115 U/L Final  . AST 07/03/2016 20  10 - 35 U/L Final  . ALT 07/03/2016 35  9 - 46 U/L Final  . Total Protein 07/03/2016 6.6  6.1 - 8.1 g/dL Final  . Albumin 07/03/2016 3.7  3.6 - 5.1 g/dL Final  . Calcium 07/03/2016 9.3  8.6 - 10.3 mg/dL Final  . GFR, Est African American 07/03/2016 53* >=60 mL/min Final  . GFR, Est Non African American 07/03/2016 45* >=60 mL/min Final  . Hgb A1c MFr Bld 07/03/2016 9.2* <5.7 % Final   Comment:   For someone without known diabetes, a hemoglobin A1c value of 6.5% or greater indicates that they may have diabetes and this should be confirmed with a follow-up test.   For someone with known diabetes, a value <7% indicates that their diabetes is well controlled and a value greater than or equal to 7% indicates suboptimal control. A1c targets should be individualized based on duration of diabetes, age, comorbid conditions, and other considerations.   Currently, no consensus exists for use of hemoglobin A1c for diagnosis of diabetes for children.     . Mean Plasma Glucose 07/03/2016 217  mg/dL Final  . WBC 07/03/2016 7.2  3.8 - 10.8 K/uL Final  . RBC 07/03/2016 3.66* 4.20 - 5.80 MIL/uL Final  . Hemoglobin 07/03/2016 11.3* 13.0 - 17.0 g/dL Final  . HCT 07/03/2016 34.4* 38.5 - 50.0 % Final  . MCV 07/03/2016 94.0  80.0 - 100.0 fL Final  . MCH 07/03/2016 30.9  27.0 - 33.0 pg Final  . MCHC 07/03/2016 32.8  32.0 - 36.0 g/dL Final  . RDW 07/03/2016 14.2  11.0 - 15.0 % Final  . Platelets 07/03/2016 209  140 - 400 K/uL Final  . MPV 07/03/2016 8.8  7.5 - 12.5 fL Final  . Neutro Abs 07/03/2016 4752  1,500 - 7,800 cells/uL Final  . Lymphs Abs 07/03/2016 1656  850 - 3,900 cells/uL Final  . Monocytes Absolute  07/03/2016 576  200 - 950 cells/uL Final  . Eosinophils Absolute 07/03/2016 216  15 - 500 cells/uL Final  . Basophils Absolute 07/03/2016 0  0 - 200 cells/uL Final  . Neutrophils Relative % 07/03/2016 66  % Final  . Lymphocytes Relative 07/03/2016 23  % Final  . Monocytes Relative 07/03/2016 8  % Final  . Eosinophils Relative 07/03/2016 3  % Final  . Basophils Relative 07/03/2016 0  % Final  . Smear Review 07/03/2016 Criteria for review not met   Final  . Cholesterol 07/03/2016 213* <200 mg/dL Final  . Triglycerides 07/03/2016 216* <150 mg/dL Final  . HDL 07/03/2016 37* >40 mg/dL Final  . Total CHOL/HDL Ratio 07/03/2016 5.8* <5.0 Ratio Final  . VLDL 07/03/2016 43* <30 mg/dL Final  . LDL Cholesterol 07/03/2016 133* <100 mg/dL Final   .  ALT dropped more than 100 points and AST dropped by 50% off Lipitor. Creatinine also improved after holding Jardiance and metformin.  Blood pressure remains low. He also continues to have hyperglycemia. Blood sugars last week were over 300. Since starting glipizide they're now down to 250 this morning however he's only been on the medication for 48 hours.  At that time, my plan was: Patient seems to be improving. Recheck CMP in one week. Hold hydrochlorothiazide given the acute renal insufficiency. If in one week, the patient's creatinine is back to normal, I will resume metformin. Hopefully the combination of metformin and glipizide will control his hyperglycemia. If the patient's liver function tests have not improved at that point I will discontinue allopurinol. If they have improved at that point, we will need to make a decision about when to resume a statin and at what dose.  07/04/17 Liver function tests have now normalized off Lipitor. Creatinine is approaching his baseline at 1.45. He feels back to normal. Unfortunately hemoglobin A1c has risen to 9.2 off metformin and Jardiance.  LDL cholesterol has risen greater than 130 off Lipitor. His goal LDL  cholesterol is less than 70 given his history of 50% stenosis in the right internal carotid artery and coronary artery disease  Past Medical History:  Diagnosis Date  . AAA (abdominal aortic aneurysm) ([place]) 8/12   3.5cm  . CAD (coronary artery disease)   . Colon polyps   . Diabetes mellitus without complication ([place])   . 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 Hyperlipidemia   . Hypertension   . 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 Neuromuscular disorder ([place])    L2-3,L5-S1 bulging disc /nerve impingement  . PAD (peripheral artery disease) ([place])    Past Surgical History:  Procedure Laterality Date  . COLONOSCOPY N/A 09/27/2012   Procedure: COLONOSCOPY;  Surgeon: Daneil Dolin, MD;  Location: [facility];  Service: Endoscopy;  Laterality: N/A;  9:30  . CORONARY ANGIOPLASTY     1989  . TONSILLECTOMY      Current Outpatient Prescriptions on File Prior to Visit  Medication Sig Dispense Refill  . allopurinol (ZYLOPRIM) 300 MG tablet TAKE 1 TABLET EVERY DAY (NEED MD APPOINTMENT) 90 tablet 3  . aspirin 325 MG EC tablet Take 325 mg by mouth daily.    Marland Kitchen diltiazem (CARTIA XT) 240 MG 24 hr capsule Take 1 capsule (240 mg total) by mouth daily. 90 capsule 3  . glucose blood (TRUETEST TEST) test strip 1 each by Other route as needed for other. Check bs bid  #200 / 11 refills    . hydrochlorothiazide (HYDRODIURIL) 25 MG tablet TAKE 1 TABLET (25 MG TOTAL) BY MOUTH DAILY. 90 tablet 3  . lisinopril (PRINIVIL,ZESTRIL) 40 MG tablet TAKE 1 TABLET EVERY DAY (NEED MD APPOINTMENT) 90 tablet 3  . metoprolol succinate (TOPROL-XL) 25 MG 24 hr tablet TAKE 2 TABLETS EVERY DAY 180 tablet 3  . Multiple Vitamin (MULTIVITAMIN WITH MINERALS) TABS Take 1 tablet by mouth daily.    . TRUEPLUS LANCETS 28G MISC Use as directed to monitor FSBS. DX: 250.00 210 each 3  . empagliflozin (JARDIANCE) 25 MG TABS tablet Take 25 mg by mouth daily. (Patient not  taking: Reported on 06/23/2016) 30 tablet 5  . metFORMIN (GLUCOPHAGE) 500 MG tablet TAKE 1 TABLET TWICE DAILY WITH MEALS (NEED MD APPOINTMENT) (Patient not taking: Reported on 06/23/2016) 180 tablet 3   No  current facility-administered medications on file prior to visit.    No Known Allergies Social History   Social History  . Marital status: Married    Spouse name: N/A  . Number of children: N/A  . Years of education: N/A   Occupational History  . retired Retired    Designer, television/film set    Social History Main Topics  . Smoking status: Former Smoker    Years: 35.00    Types: Cigarettes    Quit date: 01/02/1988  . Smokeless tobacco: Never Used  . Alcohol use Yes     Comment: rare beer, no history of ETOH abuse  . Drug use: No  . Sexual activity: Not on file   Other Topics Concern  . Not on file   Social History Narrative  . No narrative on file   Family History  Problem Relation Age of Onset  . Diabetes Mother   . Heart disease Mother     After age 64  . Heart attack Mother   . Hypertension Mother   . Diabetes Father   . Heart disease Father     After age 72  . Hypertension Father   . Heart attack Father   . Brain cancer Sister   . Cancer Sister     Brain  . Diabetes Sister   . Hypertension Sister   . Cancer Brother     Chest Tumor  . Diabetes Brother   . Hypertension Brother   . Deep vein thrombosis Sister   . Diabetes Sister   . Diabetes Brother     Bilateral leg  . Colon cancer Neg Hx       Review of Systems  All other systems reviewed and are negative.      Objective:   Physical Exam  Constitutional: He is oriented to person, place, and time. He appears well-developed and well-nourished.  Non-toxic appearance. He does not have a sickly appearance. He does not appear ill. No distress.  HENT:  Head: Normocephalic and atraumatic.  Right Ear: External ear normal.  Left Ear: External ear normal.  Nose: No mucosal edema or rhinorrhea.  Mouth/Throat:  Oropharynx is clear and moist. No oropharyngeal exudate.  Eyes: Conjunctivae are normal. Pupils are equal, round, and reactive to light. Right eye exhibits no discharge. Left eye exhibits no discharge. No scleral icterus.  Neck: Normal range of motion. Neck supple. No JVD present. No tracheal deviation present. No thyromegaly present.  Cardiovascular: Normal rate, regular rhythm, normal heart sounds and intact distal pulses.  Exam reveals no gallop and no friction rub.   No murmur heard. Pulmonary/Chest: Effort normal and breath sounds normal. No stridor. No respiratory distress. He has no wheezes. He has no rales. He exhibits no tenderness.  Abdominal: Soft. Bowel sounds are normal. He exhibits no distension and no mass. There is no tenderness. There is no rebound and no guarding.  Musculoskeletal: Normal range of motion. He exhibits no edema or tenderness.  Lymphadenopathy:    He has no cervical adenopathy.  Neurological: He is alert and oriented to person, place, and time. He has normal reflexes. No cranial nerve deficit. He exhibits normal muscle tone. Coordination normal.  Skin: Skin is warm. No rash noted. He is not diaphoretic. No erythema. No pallor.  Psychiatric: He has a normal mood and affect. His behavior is normal. Judgment and thought content normal.  Vitals reviewed.         Assessment & Plan:  Elevated LFTs  Acute renal insufficiency  Type 2 diabetes mellitus with hyperglycemia, without long-term current use of insulin (HCC)  ASCVD (arteriosclerotic cardiovascular disease)  Thankfully, the patient is now back to his baseline. His elevated liver function tests seem to be secondary to Lipitor. We discussed his options to manage his diabetes. Patient is unable to afford name brand medication. He also does not want to try Actos. Therefore his option is limited to resuming metformin with the glipizide and rechecking hemoglobin A1c in 3 months. We will need to monitor his  creatinine closely as he is borderline to use metformin. He understands the risk of lactic acidosis and he wishes to proceed. Regarding his cholesterol, I will have the patient resume Lipitor but only 20 mg, he was taking 80. Recheck liver function test/CMP in 2 weeks. Recheck hemoglobin A1c and fasting lipid panel in 3 months

## 2016-07-05 ENCOUNTER — Other Ambulatory Visit: Payer: Self-pay | Admitting: Family Medicine

## 2016-07-05 MED ORDER — PRODIGY LANCETS 28G MISC: 3 refills | Status: DC

## 2016-07-05 MED ORDER — PRODIGY LANCING DEVICE MISC
2 refills | Status: AC
Start: 2016-07-05 — End: ?

## 2016-07-05 MED ORDER — PRODIGY CONTROL SOLUTION HIGH VI SOLN: 1 refills | Status: DC

## 2016-07-05 MED ORDER — GLUCOSE BLOOD VI STRP: ORAL_STRIP | 3 refills | Status: DC

## 2016-07-05 MED ORDER — PRODIGY AUTOCODE BLOOD GLUCOSE W/DEVICE KIT: PACK | 2 refills | Status: DC

## 2016-07-06 ENCOUNTER — Other Ambulatory Visit: Payer: Self-pay | Admitting: *Deleted

## 2016-07-06 MED ORDER — PRODIGY AUTOCODE BLOOD GLUCOSE W/DEVICE KIT: PACK | 2 refills | Status: DC

## 2016-07-12 ENCOUNTER — Other Ambulatory Visit: Payer: Self-pay | Admitting: Family Medicine

## 2016-07-12 MED ORDER — TRUEPLUS LANCETS 33G MISC: 3 refills | Status: DC

## 2016-07-12 MED ORDER — GLUCOSE BLOOD VI STRP: ORAL_STRIP | 12 refills | Status: DC

## 2016-07-12 MED ORDER — TRUE METRIX METER DEVI: 1.0000 | Freq: Once | 0 refills | Status: AC

## 2016-07-12 NOTE — Telephone Encounter (Signed)
Received fax from pharm that he need true metrix testing meter and supplies for his diabetes. Rx's for all sent to [facility].

## 2016-07-17 ENCOUNTER — Other Ambulatory Visit: Payer: Medicare HMO

## 2016-07-17 ENCOUNTER — Other Ambulatory Visit: Payer: Self-pay | Admitting: Family Medicine

## 2016-07-17 DIAGNOSIS — E785 Hyperlipidemia, unspecified: Secondary | ICD-10-CM | POA: Diagnosis not present

## 2016-07-17 DIAGNOSIS — E119 Type 2 diabetes mellitus without complications: Secondary | ICD-10-CM | POA: Diagnosis not present

## 2016-07-17 LAB — COMPLETE METABOLIC PANEL WITH GFR
ALT: 49 U/L — AB (ref 9–46)
AST: 42 U/L — ABNORMAL HIGH (ref 10–35)
Albumin: 3.8 g/dL (ref 3.6–5.1)
Alkaline Phosphatase: 136 U/L — ABNORMAL HIGH (ref 40–115)
BUN: 11 mg/dL (ref 7–25)
CHLORIDE: 106 mmol/L (ref 98–110)
CO2: 23 mmol/L (ref 20–31)
Calcium: 8.9 mg/dL (ref 8.6–10.3)
Creat: 1.22 mg/dL — ABNORMAL HIGH (ref 0.70–1.18)
GFR, EST AFRICAN AMERICAN: 65 mL/min (ref >=60)
GFR, EST NON AFRICAN AMERICAN: 56 mL/min — AB (ref >=60)
Glucose, Bld: 118 mg/dL — ABNORMAL HIGH (ref 70–99)
Potassium: 4.3 mmol/L (ref 3.5–5.3)
Sodium: 139 mmol/L (ref 135–146)
Total Bilirubin: 0.4 mg/dL (ref 0.2–1.2)
Total Protein: 6.2 g/dL (ref 6.1–8.1)

## 2016-07-18 ENCOUNTER — Other Ambulatory Visit: Payer: Self-pay | Admitting: Family Medicine

## 2016-07-18 MED ORDER — GLUCOSE BLOOD VI STRP: ORAL_STRIP | 12 refills | Status: DC

## 2016-07-18 MED ORDER — TRUE METRIX METER W/DEVICE KIT: PACK | 1 refills | Status: AC

## 2016-07-18 MED ORDER — TRUEPLUS LANCETS 33G MISC
3 refills | Status: DC
Start: 2016-07-18 — End: 2020-10-04

## 2016-07-19 ENCOUNTER — Encounter: Payer: Self-pay | Admitting: Family Medicine

## 2016-07-24 ENCOUNTER — Encounter: Payer: Self-pay | Admitting: Family

## 2016-07-27 ENCOUNTER — Ambulatory Visit (HOSPITAL_COMMUNITY)
Admission: RE | Admit: 2016-07-27 | Discharge: 2016-07-27 | Disposition: A | Discharge status: Home / Self Care | Payer: Medicare HMO | Source: Ambulatory Visit | Attending: Family | Admitting: Family

## 2016-07-27 ENCOUNTER — Ambulatory Visit (INDEPENDENT_AMBULATORY_CARE_PROVIDER_SITE_OTHER): Payer: Medicare HMO | Admitting: Family

## 2016-07-27 ENCOUNTER — Ambulatory Visit (INDEPENDENT_AMBULATORY_CARE_PROVIDER_SITE_OTHER)
Admission: RE | Admit: 2016-07-27 | Discharge: 2016-07-27 | Disposition: A | Discharge status: Home / Self Care | Payer: Medicare HMO | Source: Ambulatory Visit | Attending: Family | Admitting: Family

## 2016-07-27 ENCOUNTER — Encounter: Payer: Self-pay | Admitting: Family

## 2016-07-27 VITALS — BP 156/69 | HR 60 | Resp 16 | Ht 69.0 in | Wt 215.0 lb

## 2016-07-27 DIAGNOSIS — I6523 Occlusion and stenosis of bilateral carotid arteries: Secondary | ICD-10-CM | POA: Diagnosis not present

## 2016-07-27 DIAGNOSIS — I714 Abdominal aortic aneurysm, without rupture, unspecified: Secondary | ICD-10-CM

## 2016-07-27 DIAGNOSIS — I6521 Occlusion and stenosis of right carotid artery: Secondary | ICD-10-CM | POA: Diagnosis not present

## 2016-07-27 DIAGNOSIS — R0989 Other specified symptoms and signs involving the circulatory and respiratory systems: Secondary | ICD-10-CM | POA: Diagnosis not present

## 2016-07-27 LAB — VAS US CAROTID
LEFT ECA DIAS: -11 cm/s
LEFT VERTEBRAL DIAS: 5 cm/s
LICAPDIAS: -26 cm/s
LICAPSYS: -102 cm/s
Left CCA dist dias: -16 cm/s
Left CCA dist sys: -130 cm/s
Left CCA prox dias: 17 cm/s
Left CCA prox sys: 111 cm/s
Left ICA dist dias: -31 cm/s
Left ICA dist sys: -126 cm/s
RCCAPDIAS: 16 cm/s
RIGHT CCA MID DIAS: 19 cm/s
RIGHT ECA DIAS: -12 cm/s
RIGHT VERTEBRAL DIAS: -9 cm/s
Right CCA prox sys: 101 cm/s
Right cca dist sys: -96 cm/s

## 2016-07-27 NOTE — Patient Instructions (Signed)
Stroke Prevention Some medical conditions and behaviors are associated with an increased chance of having a stroke. You may prevent a stroke by making healthy choices and managing medical conditions. How can I reduce my risk of having a stroke?  Stay physically active. Get at least 30 minutes of activity on most or all days.  Do not smoke. It may also be helpful to avoid exposure to secondhand smoke.  Limit alcohol use. Moderate alcohol use is considered to be:  No more than 2 drinks per day for men.  No more than 1 drink per day for nonpregnant women.  Eat healthy foods. This involves:  Eating 5 or more servings of fruits and vegetables a day.  Making dietary changes that address high blood pressure (hypertension), high cholesterol, diabetes, or obesity.  Manage your cholesterol levels.  Making food choices that are high in fiber and low in saturated fat, trans fat, and cholesterol may control cholesterol levels.  Take any prescribed medicines to control cholesterol as directed by your health care provider.  Manage your diabetes.  Controlling your carbohydrate and sugar intake is recommended to manage diabetes.  Take any prescribed medicines to control diabetes as directed by your health care provider.  Control your hypertension.  Making food choices that are low in salt (sodium), saturated fat, trans fat, and cholesterol is recommended to manage hypertension.  Ask your health care provider if you need treatment to lower your blood pressure. Take any prescribed medicines to control hypertension as directed by your health care provider.  If you are 18-39 years of age, have your blood pressure checked every 3-5 years. If you are 40 years of age or older, have your blood pressure checked every year.  Maintain a healthy weight.  Reducing calorie intake and making food choices that are low in sodium, saturated fat, trans fat, and cholesterol are recommended to manage  weight.  Stop drug abuse.  Avoid taking birth control pills.  Talk to your health care provider about the risks of taking birth control pills if you are over 35 years old, smoke, get migraines, or have ever had a blood clot.  Get evaluated for sleep disorders (sleep apnea).  Talk to your health care provider about getting a sleep evaluation if you snore a lot or have excessive sleepiness.  Take medicines only as directed by your health care provider.  For some people, aspirin or blood thinners (anticoagulants) are helpful in reducing the risk of forming abnormal blood clots that can lead to stroke. If you have the irregular heart rhythm of atrial fibrillation, you should be on a blood thinner unless there is a good reason you cannot take them.  Understand all your medicine instructions.  Make sure that other conditions (such as anemia or atherosclerosis) are addressed. Get help right away if:  You have sudden weakness or numbness of the face, arm, or leg, especially on one side of the body.  Your face or eyelid droops to one side.  You have sudden confusion.  You have trouble speaking (aphasia) or understanding.  You have sudden trouble seeing in one or both eyes.  You have sudden trouble walking.  You have dizziness.  You have a loss of balance or coordination.  You have a sudden, severe headache with no known cause.  You have new chest pain or an irregular heartbeat. Any of these symptoms may represent a serious problem that is an emergency. Do not wait to see if the symptoms will go away.   Get medical help at once. Call your local emergency services (911 in [place] Do not drive yourself to the hospital. This information is not intended to replace advice given to you by your health care provider. Make sure you discuss any questions you have with your health care provider. Document Released: 04/06/2004 Document Revised: 08/05/2015 Document Reviewed: 08/30/2012 Elsevier  Interactive Patient Education  2017 [employer].     Abdominal Aortic Aneurysm Blood pumps away from the heart through tubes (blood vessels) called arteries. Aneurysms are weak or damaged places in the wall of an artery. It bulges out like a balloon. An abdominal aortic aneurysm happens in the main artery of the body (aorta). It can burst or tear, causing bleeding inside the body. This is an emergency. It needs treatment right away. What are the causes? The exact cause is unknown. Things that could cause this problem include:  Fat and other substances building up in the lining of a tube.  Swelling of the walls of a blood vessel.  Certain tissue diseases.  Belly (abdominal) trauma.  An infection in the main artery of the body. What increases the risk? There are things that make it more likely for you to have an aneurysm. These include:  Being over the age of 78 years old.  Having high blood pressure (hypertension).  Being a male.  Being white.  Being very overweight (obese).  Having a family history of aneurysm.  Using tobacco products. What are the signs or symptoms? Symptoms depend on the size of the aneurysm and how fast it grows. There may not be symptoms. If symptoms occur, they can include:  Pain (belly, side, lower back, or groin).  Feeling full after eating a small amount of food.  Feeling sick to your stomach (nauseous), throwing up (vomiting), or both.  Feeling a lump in your belly that feels like it is beating (pulsating).  Feeling like you will pass out (faint). How is this treated?  Medicine to control blood pressure and pain.  Imaging tests to see if the aneurysm gets bigger.  Surgery. How is this prevented? To lessen your chance of getting this condition:  Stop smoking. Stop chewing tobacco.  Limit or avoid alcohol.  Keep your blood pressure, blood sugar, and cholesterol within normal limits.  Eat less salt.  Eat foods low in saturated  fats and cholesterol. These are found in animal and whole dairy products.  Eat more fiber. Fiber is found in whole grains, vegetables, and fruits.  Keep a healthy weight.  Stay active and exercise often. This information is not intended to replace advice given to you by your health care provider. Make sure you discuss any questions you have with your health care provider. Document Released: 06/24/2012 Document Revised: 08/05/2015 Document Reviewed: 03/29/2012 Elsevier Interactive Patient Education  2017 Elsevier Inc.  

## 2016-07-27 NOTE — Progress Notes (Signed)
[facility] [place] HISTORY AND PHYSICAL   MRN : [id number]  History of Present Illness:   Juan [name] is a 76 y.o. male had a carotid duplex scan at [facility] for evaluation of an asymptomatic carotid bruit. Right side was 50-69%, left side less than 50% antegrade vertebral flow bilaterally. This ultrasound was done on 12/17/2015. Other medical problems include 3.3 cm AAA 2/16. He has been followed for the abdominal aortic aneurysm by [place] for several years.  Dr. Oneida Alar evaluated Juan [name] on 01-19-16.   The patient does not have back or abdominal pain. 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 He walks about a mile daily, denies claudications symptoms.  The patient denies history of stroke or TIA symptoms.  He had balloon angioplasty of a coronary artery in 1989 and stopped smoking then; patient denies history of MI. Patient states he is working with his PCP on getting his blood pressure under better control, states it runs [phone] systolic.   He denies dyspnea, denies chest pain, but states he has had an occasional cough lately.   He takes a daily 325 mg ASA, no other antiplatelet agents nor anticoagulants.  Juan [name] states his PCP stopped his Lipitor due to a liver issue.   Juan [name] Diabetic: Yes, 9.2 A1C on 07-03-16 (review of records), uncontrolled Juan [name] smoker: former smoker, quit at age 1   Current Outpatient Prescriptions  Medication Sig Dispense Refill  . allopurinol (ZYLOPRIM) 300 MG tablet TAKE 1 TABLET EVERY DAY (NEED MD APPOINTMENT) 90 tablet 3  . aspirin 325 MG EC tablet Take 325 mg by mouth daily.    . Blood Glucose Monitoring Suppl (TRUE METRIX METER) w/Device KIT Use as Directed 1 kit 1  . diltiazem (CARTIA XT) 240 MG 24 hr capsule Take 1 capsule (240 mg total) by mouth daily. 90 capsule 3  . glipiZIDE (GLUCOTROL) 5 MG tablet TAKE 1 TABLET TWO TIMES DAILY BEFORE A MEAL 180 tablet 3  . glucose blood (TRUE METRIX BLOOD  GLUCOSE TEST) test strip Check BS QD - dx: e11.9 100 each 12  . Lancet Devices (PRODIGY LANCING DEVICE) MISC Use bid DX e11.9 1 each 2  . lisinopril (PRINIVIL,ZESTRIL) 40 MG tablet TAKE 1 TABLET EVERY DAY (NEED MD APPOINTMENT) 90 tablet 3  . metFORMIN (GLUCOPHAGE) 500 MG tablet TAKE 1 TABLET TWICE DAILY WITH MEALS (NEED MD APPOINTMENT) 180 tablet 3  . metoprolol succinate (TOPROL-XL) 25 MG 24 hr tablet TAKE 2 TABLETS EVERY DAY 180 tablet 3  . Multiple Vitamin (MULTIVITAMIN WITH MINERALS) TABS Take 1 tablet by mouth daily.    . TRUEPLUS LANCETS 33G MISC Check BS QD - dx: e11.9 100 each 3   No current facility-administered medications for this visit.     Past Medical History:  Diagnosis Date  . AAA (abdominal aortic aneurysm) ([place]) 8/12   3.5cm  . CAD (coronary artery disease)   . Colon polyps   . Diabetes mellitus without complication ([place])   . 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 Hyperlipidemia   . Hypertension   . 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 Neuromuscular disorder ([place])    L2-3,L5-S1 bulging disc /nerve impingement  . PAD (peripheral artery disease) ([place])     Social History Social History  Substance Use Topics  . Smoking status: Former Smoker    Years: 35.00    Types: Cigarettes  Quit date: 01/02/1988  . Smokeless tobacco: Never Used  . Alcohol use Yes     Comment: rare beer, no history of ETOH abuse    Family History Family History  Problem Relation Age of Onset  . Diabetes Mother   . Heart disease Mother        After age 42  . Heart attack Mother   . Hypertension Mother   . Diabetes Father   . Heart disease Father        After age 35  . Hypertension Father   . Heart attack Father   . Brain cancer Sister   . Cancer Sister        Brain  . Diabetes Sister   . Hypertension Sister   . Cancer Brother        Chest Tumor  . Diabetes Brother   . Hypertension Brother   . Deep vein  thrombosis Sister   . Diabetes Sister   . Diabetes Brother        Bilateral leg  . Colon cancer Neg Hx     Surgical History Past Surgical History:  Procedure Laterality Date  . COLONOSCOPY N/A 09/27/2012   Procedure: COLONOSCOPY;  Surgeon: Daneil Dolin, MD;  [facility];  Service: Endoscopy;  Laterality: N/A;  9:30  . CORONARY ANGIOPLASTY     1989  . TONSILLECTOMY      No Known Allergies  Current Outpatient Prescriptions  Medication Sig Dispense Refill  . allopurinol (ZYLOPRIM) 300 MG tablet TAKE 1 TABLET EVERY DAY (NEED MD APPOINTMENT) 90 tablet 3  . aspirin 325 MG EC tablet Take 325 mg by mouth daily.    . Blood Glucose Monitoring Suppl (TRUE METRIX METER) w/Device KIT Use as Directed 1 kit 1  . diltiazem (CARTIA XT) 240 MG 24 hr capsule Take 1 capsule (240 mg total) by mouth daily. 90 capsule 3  . glipiZIDE (GLUCOTROL) 5 MG tablet TAKE 1 TABLET TWO TIMES DAILY BEFORE A MEAL 180 tablet 3  . glucose blood (TRUE METRIX BLOOD GLUCOSE TEST) test strip Check BS QD - dx: e11.9 100 each 12  . Lancet Devices (PRODIGY LANCING DEVICE) MISC Use bid DX e11.9 1 each 2  . lisinopril (PRINIVIL,ZESTRIL) 40 MG tablet TAKE 1 TABLET EVERY DAY (NEED MD APPOINTMENT) 90 tablet 3  . metFORMIN (GLUCOPHAGE) 500 MG tablet TAKE 1 TABLET TWICE DAILY WITH MEALS (NEED MD APPOINTMENT) 180 tablet 3  . metoprolol succinate (TOPROL-XL) 25 MG 24 hr tablet TAKE 2 TABLETS EVERY DAY 180 tablet 3  . Multiple Vitamin (MULTIVITAMIN WITH MINERALS) TABS Take 1 tablet by mouth daily.    . TRUEPLUS LANCETS 33G MISC Check BS QD - dx: e11.9 100 each 3   No current facility-administered medications for this visit.      REVIEW OF SYSTEMS: See HPI for pertinent positives and negatives.  Physical Examination Vitals:   07/27/16 0920 07/27/16 0921 07/27/16 0925  BP: (!) 172/70 (!) 162/68 (!) 156/69  Pulse: 60 62 60  Resp: 16    SpO2: 98% 98% 98%  Weight: 215 lb (97.5 kg) 215 lb (97.5 kg) 215 lb (97.5 kg)   Height: _0  (1.753 m) _1  (1.753 m) _2  (1.753 m)   Body mass index is 31.75 kg/m.  General:  A&O x 3, WD, obese male  Pulmonary: Sym exp, respirations are non labored, good air movt, + rales in both bases, no rhonchi or wheezing.   Cardiac: RRR, Nl S1, S2, no  detected murmur.  Carotid Bruits Left Right   Negative Negative  Aorta is not palpable Radial pulses are 3+ palpable and equal.   VASCULAR EXAM:     LE Pulses LEFT RIGHT       FEMORAL 3+palpable 3+palpable   POPLITEAL 3+palpable  1+ palpable   POSTERIOR TIBIAL  3+palpable   3+palpable    DORSALIS PEDIS  ANTERIOR TIBIAL not palpable  3+palpable      Gastrointestinal: soft, NTND, -G/R, - HSM, - palpable masses, - CVAT B.  Musculoskeletal: M/S 5/5 throughout, Extremities without ischemic changes.   Neurologic: CN 2-12 intact , Pain and light touch intact in extremities, Motor exam as listed above.     ASSESSMENT:  Niquan Charnley is a 76 y.o. male who is being monitored for a moderate sized AAA (today 4.0 cm), and minimal extracranial carotid artery stenosis. He has no back pain nor abdominal pain. He has no history of stroke or TIA.  He has no claudication symptoms with walking.   Prominent left popliteal pulse, see Plan.  His atherosclerotic risk factors include uncontrolled DM, CAD, former smoker (for 34 years), and obesity.   I advised Juan [name] to seek advice from his PCP re his cough, rales in lung bases, and uncontrolled hypertension.   DATA (07/27/16):  AAA Duplex: Saccular aneurysm at the distal aorta with maximal diameter of 4.0 cm. Elevated velocities in the in the common iliac arteries suggest >50% stenosis bilaterally. Right CIA: 1.0 cm; Left CIA: 1.2 cm [facility]-14 [facility]: 3.4 cm  AAA.  Carotid Duplex: Right ICA: <40% Left ICA: no stenosis. Bilateral vertebral artery flow is antegrade.  Bilateral subclavian artery waveforms are normal.  No previous exam at this facility for comparison.    PLAN:   Based on today's exam and non-invasive vascular lab results, the patient will follow up in 1 year with the following tests: AAA duplex and bilateral popliteal artery duplex,  2 years for carotid duplex. I discussed in depth with the patient the nature of atherosclerosis, and emphasized the importance of maximal medical management including strict control of blood pressure, blood glucose, and lipid levels, obtaining regular exercise, and cessation of smoking.  The patient is aware that without maximal medical management the underlying atherosclerotic disease process will progress, limiting the benefit of any interventions. Consideration for repair of AAA would be made when the size approaches 5.5 cm, growth > 1 cm/yr, and symptomatic status. The patient was given information about stroke prevention and what symptoms should prompt the patient to seek immediate medical care. The patient was given information about AAA including signs, symptoms, treatment,  what symptoms should prompt the patient to seek immediate medical care, and how to minimize the risk of enlargement and rupture of aneurysms.  Thank you for allowing [place] to participate in this patient's care.  Clemon Chambers, RN, MSN, FNP-C [facility] Office: [phone]  Clinic MD: [facility] 07/27/2016 9:48 AM

## 2016-08-02 NOTE — Addendum Note (Signed)
Addended by: Lianne Cure A on: 08/02/2016 01:00 PM   Modules accepted: Orders

## 2016-08-28 ENCOUNTER — Other Ambulatory Visit: Payer: Medicare HMO

## 2016-08-28 DIAGNOSIS — E119 Type 2 diabetes mellitus without complications: Secondary | ICD-10-CM

## 2016-08-28 DIAGNOSIS — E785 Hyperlipidemia, unspecified: Secondary | ICD-10-CM | POA: Diagnosis not present

## 2016-08-28 LAB — CBC WITH DIFFERENTIAL/PLATELET
BASOS ABS: 54 {cells}/uL (ref 0–200)
Basophils Relative: 1 %
EOS ABS: 216 {cells}/uL (ref 15–500)
Eosinophils Relative: 4 %
HCT: 37.8 % — ABNORMAL LOW (ref 38.5–50.0)
HEMOGLOBIN: 12.6 g/dL — AB (ref 13.0–17.0)
LYMPHS ABS: 1512 {cells}/uL (ref 850–3900)
Lymphocytes Relative: 28 %
MCH: 30.4 pg (ref 27.0–33.0)
MCHC: 33.3 g/dL (ref 32.0–36.0)
MCV: 91.1 fL (ref 80.0–100.0)
MPV: 9.4 fL (ref 7.5–12.5)
Monocytes Absolute: 432 cells/uL (ref 200–950)
Monocytes Relative: 8 %
NEUTROS ABS: 3186 {cells}/uL (ref 1500–7800)
Neutrophils Relative %: 59 %
Platelets: 137 10*3/uL — ABNORMAL LOW (ref 140–400)
RBC: 4.15 MIL/uL — ABNORMAL LOW (ref 4.20–5.80)
RDW: 14.5 % (ref 11.0–15.0)
WBC: 5.4 10*3/uL (ref 3.8–10.8)

## 2016-08-28 LAB — COMPLETE METABOLIC PANEL WITH GFR
ALT: 13 U/L (ref 9–46)
AST: 16 U/L (ref 10–35)
Albumin: 3.8 g/dL (ref 3.6–5.1)
Alkaline Phosphatase: 70 U/L (ref 40–115)
BUN: 17 mg/dL (ref 7–25)
CALCIUM: 8.6 mg/dL (ref 8.6–10.3)
CHLORIDE: 105 mmol/L (ref 98–110)
CO2: 24 mmol/L (ref 20–31)
CREATININE: 1.17 mg/dL (ref 0.70–1.18)
GFR, Est African American: 69 mL/min (ref >=60)
GFR, Est Non African American: 59 mL/min — ABNORMAL LOW (ref >=60)
Glucose, Bld: 114 mg/dL — ABNORMAL HIGH (ref 70–99)
Potassium: 3.9 mmol/L (ref 3.5–5.3)
Sodium: 139 mmol/L (ref 135–146)
Total Bilirubin: 0.6 mg/dL (ref 0.2–1.2)
Total Protein: 6.5 g/dL (ref 6.1–8.1)

## 2016-08-28 LAB — LIPID PANEL
CHOL/HDL RATIO: 6.6 ratio — AB (ref <5.0)
Cholesterol: 237 mg/dL — ABNORMAL HIGH (ref <200)
HDL: 36 mg/dL — ABNORMAL LOW (ref >40)
LDL Cholesterol: 166 mg/dL — ABNORMAL HIGH (ref <100)
Triglycerides: 176 mg/dL — ABNORMAL HIGH (ref <150)
VLDL: 35 mg/dL — AB (ref <30)

## 2016-08-29 ENCOUNTER — Encounter: Payer: Self-pay | Admitting: Family Medicine

## 2016-08-29 LAB — HEMOGLOBIN A1C
HEMOGLOBIN A1C: 6.7 % — AB (ref <5.7)
Mean Plasma Glucose: 146 mg/dL

## 2016-09-01 ENCOUNTER — Ambulatory Visit: Payer: Medicare HMO | Admitting: Family Medicine

## 2016-09-04 ENCOUNTER — Encounter: Payer: Self-pay | Admitting: Family Medicine

## 2016-09-05 MED ORDER — PRAVASTATIN SODIUM 40 MG PO TABS: 40.0000 mg | ORAL_TABLET | Freq: Every day | ORAL | 3 refills | Status: DC

## 2016-10-16 ENCOUNTER — Other Ambulatory Visit: Payer: Medicare HMO

## 2016-10-16 DIAGNOSIS — E785 Hyperlipidemia, unspecified: Secondary | ICD-10-CM | POA: Diagnosis not present

## 2016-10-16 DIAGNOSIS — E119 Type 2 diabetes mellitus without complications: Secondary | ICD-10-CM

## 2016-10-16 LAB — COMPLETE METABOLIC PANEL WITH GFR
ALBUMIN: 3.9 g/dL (ref 3.6–5.1)
ALK PHOS: 56 U/L (ref 40–115)
ALT: 10 U/L (ref 9–46)
AST: 13 U/L (ref 10–35)
BILIRUBIN TOTAL: 0.4 mg/dL (ref 0.2–1.2)
BUN: 16 mg/dL (ref 7–25)
CO2: 26 mmol/L (ref 20–32)
Calcium: 9.1 mg/dL (ref 8.6–10.3)
Chloride: 104 mmol/L (ref 98–110)
Creat: 1.26 mg/dL — ABNORMAL HIGH (ref 0.70–1.18)
GFR, Est African American: 63 mL/min (ref >=60)
GFR, Est Non African American: 54 mL/min — ABNORMAL LOW (ref >=60)
GLUCOSE: 115 mg/dL — AB (ref 70–99)
Potassium: 4.6 mmol/L (ref 3.5–5.3)
SODIUM: 138 mmol/L (ref 135–146)
TOTAL PROTEIN: 6.2 g/dL (ref 6.1–8.1)

## 2016-10-19 ENCOUNTER — Ambulatory Visit (INDEPENDENT_AMBULATORY_CARE_PROVIDER_SITE_OTHER): Payer: Medicare HMO | Admitting: Family Medicine

## 2016-10-19 ENCOUNTER — Encounter: Payer: Self-pay | Admitting: Family Medicine

## 2016-10-19 VITALS — BP 146/80 | HR 64 | Temp 97.5°F | Resp 14 | Ht 70.5 in | Wt 218.0 lb

## 2016-10-19 DIAGNOSIS — E119 Type 2 diabetes mellitus without complications: Secondary | ICD-10-CM

## 2016-10-19 DIAGNOSIS — Z201 Contact with and (suspected) exposure to tuberculosis: Secondary | ICD-10-CM | POA: Diagnosis not present

## 2016-10-19 NOTE — Progress Notes (Signed)
Subjective:    Patient ID: Juan [name], male    DOB: 07-26-1940, 76 y.o.   MRN: [id number]  Hyperlipidemia   Diabetes   06/02/16 At the patient's last visit, he was found to have bilateral carotid bruits. Carotid Doppler showed bilateral carotid artery stenosis most severe on the right with 50-69% stenosis.  Less than 50% on the left.  Lipitor was increased to 80 mg a day and vascular surgery was consulted.  Saw Dr. Oneida Alar who recommended recheck in 6 months.  During that 6 month period of time, the patient admits that his diet has been poor. He is very much eating whatever he wants. As result his LDL cholesterol has actually worsened even though he is on a higher dose of Lipitor. His hemoglobin A1c has risen from 6.3 to greater than 8. His liver tests have also become slightly inflamed suggesting fatty liver disease versus hepatitis secondary to the statin medication. At that time, my plan was: Decrease Lipitor to 40 mg a day due to elevated liver function test and recheck liver function tests in 2 weeks. Patient will aggressively pursue dietary changes. Discontinue glipizide and replaced with Jardiance 25 mg a day due to cardiovascular mortality reduction particularly given his known carotid artery stenosis. Recheck all labs in 3 months. In 2 weeks he will return for a CMP to monitor his liver function tests  06/23/16 Unfortunately on the repeat lab work, the patient's creatinine had increased to greater than 2 indicating acute on chronic renal insufficiency. His liver function test were over 200 and 100 respectfully.   Right upper quadrant ultrasound was obtained which revealed a right renal cyst and no abnormality in the liver. It was at that time the decision was made to discontinue Lipitor, hold metformin and Giardia and skin of the renal insufficiency, and recheck lab work in one week to see if lab abnormalities will improve. Over that time, the patient reports worsening fatigue. He is also  developed a cough productive of green sputum, chest congestion, subjective fevers. Patient has lost 14 pounds over the last 2-1/2 weeks although he says that he has tried to change his diet and exercise more. Patient appears very tired. He is in no acute distress. He does appear sickly.  AT that time, my plan was: I'm very concerned about the patient's worsening liver function test, worsening renal function, and 14 pound weight loss. Right quadrant ultrasound was reassuring however there is no explanation yet to the patient's problem. Repeat CMP today now that the patient's off Lipitor, metformin, and Giardia. Treat the patient for possible bronchitis sinusitis with a Z-Pak over the weekend. Recheck on Monday. Resume glipizide 5 mg by mouth twice a day as the patient's blood sugars are over 300 off his medication. If liver function tests continue to worsen and renal function tests continue to worsen, the patient will need an SPEP/UPEP, renal ultrasound is unnecessary because right upper quadrant ultrasound revealed no hydronephrosis, however I would perform a urinalysis, screen the patient for autoimmune hepatitis.  Given his age I do not believe the hemochromatosis is likely or Wilson's disease. Would consider screen the patient for viral hepatitis. Push fluids over the weekend. Recheck on Monday.  06/26/16 Patient states that he feels much better. He continues to have postnasal drip causing a cough and some sinus pressure and sinus drainage. ALT dropped more than 100 points and AST dropped by 50% off Lipitor. Creatinine also improved after holding Jardiance and metformin.  Blood pressure remains  low. He also continues to have hyperglycemia. Blood sugars last week were over 300. Since starting glipizide they're now down to 250 this morning however he's only been on the medication for 48 hours.  At that time, my plan was: Patient seems to be improving. Recheck CMP in one week. Hold hydrochlorothiazide given the  acute renal insufficiency. If in one week, the patient's creatinine is back to normal, I will resume metformin. Hopefully the combination of metformin and glipizide will control his hyperglycemia. If the patient's liver function tests have not improved at that point I will discontinue allopurinol. If they have improved at that point, we will need to make a decision about when to resume a statin and at what dose.  07/04/17 Liver function tests have now normalized off Lipitor. Creatinine is approaching his baseline at 1.45. He feels back to normal. Unfortunately hemoglobin A1c has risen to 9.2 off metformin and Jardiance.  LDL cholesterol has risen greater than 130 off Lipitor. His goal LDL cholesterol is less than 70 given his history of 50% stenosis in the right internal carotid artery and coronary artery disease.  AT that time, my plan was: Thankfully, the patient is now back to his baseline. His elevated liver function tests seem to be secondary to Lipitor. We discussed his options to manage his diabetes. Patient is unable to afford name brand medication. He also does not want to try Actos. Therefore his option is limited to resuming metformin with the glipizide and rechecking hemoglobin A1c in 3 months. We will need to monitor his creatinine closely as he is borderline to use metformin. He understands the risk of lactic acidosis and he wishes to proceed. Regarding his cholesterol, I will have the patient resume Lipitor but only 20 mg, he was taking 80. Recheck liver function test/CMP in 2 weeks. Recheck hemoglobin A1c and fasting lipid panel in 3 months  10/19/16 Here for follow up.  Labs are listed below: Appointment on 10/16/2016  Component Date Value Ref Range Status  . Sodium 10/16/2016 138  135 - 146 mmol/L Final  . Potassium 10/16/2016 4.6  3.5 - 5.3 mmol/L Final  . Chloride 10/16/2016 104  98 - 110 mmol/L Final  . CO2 10/16/2016 26  20 - 32 mmol/L Final   Comment: ** Please note change in  reference range(s). **     . Glucose, Bld 10/16/2016 115* 70 - 99 mg/dL Final  . BUN 10/16/2016 16  7 - 25 mg/dL Final  . Creat 10/16/2016 1.26* 0.70 - 1.18 mg/dL Final   Comment:   For patients > or = 78 years of age: The upper reference limit for Creatinine is approximately 13% higher for people identified as African-American.     . Total Bilirubin 10/16/2016 0.4  0.2 - 1.2 mg/dL Final  . Alkaline Phosphatase 10/16/2016 56  40 - 115 U/L Final  . AST 10/16/2016 13  10 - 35 U/L Final  . ALT 10/16/2016 10  9 - 46 U/L Final  . Total Protein 10/16/2016 6.2  6.1 - 8.1 g/dL Final  . Albumin 10/16/2016 3.9  3.6 - 5.1 g/dL Final  . Calcium 10/16/2016 9.1  8.6 - 10.3 mg/dL Final  . GFR, Est African American 10/16/2016 63  >=60 mL/min Final  . GFR, Est Non African American 10/16/2016 54* >=60 mL/min Final   Liver tests remain normal even on pravastatin 40 mg a day. Renal function remains stable despite being on glipizide and metformin. However we do not know how  well these medications are working. He will be due for fasting lipid panel and hemoglobin A1c at the end of September. However he wanted to come in today to ensure that his liver tests have not worsened since starting pravastatin. He is concerned because his wife has recently been diagnosed with latent tuberculosis through a positive quantiferon gold assay.  He has no symptoms of tuberculosis. He denies any cough or fever or night sweats hemoptysis or weight loss.  Past Medical History:  Diagnosis Date  . AAA (abdominal aortic aneurysm) ([place]) 8/12   3.5cm  . CAD (coronary artery disease)   . Colon polyps   . Diabetes mellitus without complication ([place])   . 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 Hyperlipidemia   . Hypertension   . 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 Neuromuscular disorder ([employer])    L2-3,L5-S1 bulging disc /nerve impingement  . PAD (peripheral artery  disease) ([place])    Past Surgical History:  Procedure Laterality Date  . COLONOSCOPY N/A 09/27/2012   Procedure: COLONOSCOPY;  Surgeon: Daneil Dolin, MD;  Location[facility];  Service: Endoscopy;  Laterality: N/A;  9:30  . CORONARY ANGIOPLASTY     1989  . TONSILLECTOMY      Current Outpatient Prescriptions on File Prior to Visit  Medication Sig Dispense Refill  . allopurinol (ZYLOPRIM) 300 MG tablet TAKE 1 TABLET EVERY DAY (NEED MD APPOINTMENT) 90 tablet 3  . aspirin 325 MG EC tablet Take 325 mg by mouth daily.    . Blood Glucose Monitoring Suppl (TRUE METRIX METER) w/Device KIT Use as Directed 1 kit 1  . diltiazem (CARTIA XT) 240 MG 24 hr capsule Take 1 capsule (240 mg total) by mouth daily. 90 capsule 3  . glipiZIDE (GLUCOTROL) 5 MG tablet TAKE 1 TABLET TWO TIMES DAILY BEFORE A MEAL 180 tablet 3  . glucose blood (TRUE METRIX BLOOD GLUCOSE TEST) test strip Check BS QD - dx: e11.9 100 each 12  . Lancet Devices (PRODIGY LANCING DEVICE) MISC Use bid DX e11.9 1 each 2  . lisinopril (PRINIVIL,ZESTRIL) 40 MG tablet TAKE 1 TABLET EVERY DAY (NEED MD APPOINTMENT) 90 tablet 3  . metFORMIN (GLUCOPHAGE) 500 MG tablet TAKE 1 TABLET TWICE DAILY WITH MEALS (NEED MD APPOINTMENT) 180 tablet 3  . metoprolol succinate (TOPROL-XL) 25 MG 24 hr tablet TAKE 2 TABLETS EVERY DAY 180 tablet 3  . Multiple Vitamin (MULTIVITAMIN WITH MINERALS) TABS Take 1 tablet by mouth daily.    . pravastatin (PRAVACHOL) 40 MG tablet Take 1 tablet (40 mg total) by mouth daily. 90 tablet 3  . TRUEPLUS LANCETS 33G MISC Check BS QD - dx: e11.9 100 each 3   No current facility-administered medications on file prior to visit.    No Known Allergies Social History   Social History  . Marital status: Married    Spouse name: N/A  . Number of children: N/A  . Years of education: N/A   Occupational History  . retired Retired    Designer, television/film set    Social History Main Topics  . Smoking status: Former Smoker    Years: 35.00     Types: Cigarettes    Quit date: 01/02/1988  . Smokeless tobacco: Never Used  . Alcohol use Yes     Comment: rare beer, no history of ETOH abuse  . Drug use: No  . Sexual activity: Not on file   Other Topics Concern  .  Not on file   Social History Narrative  . No narrative on file   Family History  Problem Relation Age of Onset  . Diabetes Mother   . Heart disease Mother        After age 70  . Heart attack Mother   . Hypertension Mother   . Diabetes Father   . Heart disease Father        After age 59  . Hypertension Father   . Heart attack Father   . Brain cancer Sister   . Cancer Sister        Brain  . Diabetes Sister   . Hypertension Sister   . Cancer Brother        Chest Tumor  . Diabetes Brother   . Hypertension Brother   . Deep vein thrombosis Sister   . Diabetes Sister   . Diabetes Brother        Bilateral leg  . Colon cancer Neg Hx       Review of Systems  All other systems reviewed and are negative.      Objective:   Physical Exam  Constitutional: He is oriented to person, place, and time. He appears well-developed and well-nourished.  Non-toxic appearance. He does not have a sickly appearance. He does not appear ill. No distress.  HENT:  Head: Normocephalic and atraumatic.  Right Ear: External ear normal.  Left Ear: External ear normal.  Nose: No mucosal edema or rhinorrhea.  Mouth/Throat: Oropharynx is clear and moist. No oropharyngeal exudate.  Eyes: Pupils are equal, round, and reactive to light. Conjunctivae are normal. Right eye exhibits no discharge. Left eye exhibits no discharge. No scleral icterus.  Neck: Normal range of motion. Neck supple. No JVD present. No tracheal deviation present. No thyromegaly present.  Cardiovascular: Normal rate, regular rhythm, normal heart sounds and intact distal pulses.  Exam reveals no gallop and no friction rub.   No murmur heard. Pulmonary/Chest: Effort normal and breath sounds normal. No stridor. No  respiratory distress. He has no wheezes. He has no rales. He exhibits no tenderness.  Abdominal: Soft. Bowel sounds are normal. He exhibits no distension and no mass. There is no tenderness. There is no rebound and no guarding.  Musculoskeletal: Normal range of motion. He exhibits no edema or tenderness.  Lymphadenopathy:    He has no cervical adenopathy.  Neurological: He is alert and oriented to person, place, and time. He has normal reflexes. No cranial nerve deficit. He exhibits normal muscle tone. Coordination normal.  Skin: Skin is warm. No rash noted. He is not diaphoretic. No erythema. No pallor.  Psychiatric: He has a normal mood and affect. His behavior is normal. Judgment and thought content normal.  Vitals reviewed.         Assessment & Plan:  Exposure to TB - Plan: Quantiferon tb gold assay (blood)  Patient may have been exposed to be similar to his wife. I will screen the patient required gold assay. If positive, he will require treatment for latent tuberculosis. I would like the patient to return in the end of September for a fasting lipid panel as well as a hemoglobin A1c. Goal LDL cholesterol is less than 70. Goal hemoglobin A1c is less than 7. I will place the orders and he can return at that time for the lab work.

## 2016-10-21 LAB — QUANTIFERON TB GOLD ASSAY (BLOOD)
INTERFERON GAMMA RELEASE ASSAY: NEGATIVE
Mitogen-Nil: >10 IU/mL
QUANTIFERON NIL VALUE: 0.07 [IU]/mL
Quantiferon Tb Ag Minus Nil Value: 0.04 IU/mL

## 2016-11-19 ENCOUNTER — Other Ambulatory Visit: Payer: Self-pay | Admitting: Family Medicine

## 2016-12-04 ENCOUNTER — Other Ambulatory Visit: Payer: Medicare HMO

## 2016-12-04 DIAGNOSIS — E119 Type 2 diabetes mellitus without complications: Secondary | ICD-10-CM

## 2016-12-05 LAB — HEMOGLOBIN A1C
EAG (MMOL/L): 6.8 (calc)
Hgb A1c MFr Bld: 5.9 % of total Hgb — ABNORMAL HIGH (ref <5.7)
MEAN PLASMA GLUCOSE: 123 (calc)

## 2016-12-05 LAB — LIPID PANEL
Cholesterol: 146 mg/dL (ref <200)
HDL: 41 mg/dL (ref >40)
LDL Cholesterol (Calc): 85 mg/dL (calc)
NON-HDL CHOLESTEROL (CALC): 105 mg/dL (ref <130)
Total CHOL/HDL Ratio: 3.6 (calc) (ref <5.0)
Triglycerides: 108 mg/dL (ref <150)

## 2016-12-05 LAB — MICROALBUMIN, URINE: MICROALB UR: 8.4 mg/dL

## 2016-12-06 ENCOUNTER — Encounter: Payer: Self-pay | Admitting: Family Medicine

## 2016-12-14 ENCOUNTER — Ambulatory Visit (INDEPENDENT_AMBULATORY_CARE_PROVIDER_SITE_OTHER): Payer: Medicare HMO | Admitting: Family Medicine

## 2016-12-14 DIAGNOSIS — Z23 Encounter for immunization: Secondary | ICD-10-CM | POA: Diagnosis not present

## 2017-04-16 ENCOUNTER — Other Ambulatory Visit: Payer: Self-pay | Admitting: Family Medicine

## 2017-05-09 ENCOUNTER — Other Ambulatory Visit: Payer: Self-pay | Admitting: Family Medicine

## 2017-05-09 DIAGNOSIS — I10 Essential (primary) hypertension: Secondary | ICD-10-CM | POA: Insufficient documentation

## 2017-05-09 DIAGNOSIS — E119 Type 2 diabetes mellitus without complications: Secondary | ICD-10-CM

## 2017-05-09 DIAGNOSIS — E785 Hyperlipidemia, unspecified: Secondary | ICD-10-CM

## 2017-05-09 DIAGNOSIS — Z79899 Other long term (current) drug therapy: Secondary | ICD-10-CM

## 2017-05-14 ENCOUNTER — Other Ambulatory Visit: Payer: Medicare HMO

## 2017-05-14 DIAGNOSIS — E785 Hyperlipidemia, unspecified: Secondary | ICD-10-CM

## 2017-05-14 DIAGNOSIS — I10 Essential (primary) hypertension: Secondary | ICD-10-CM

## 2017-05-14 DIAGNOSIS — E119 Type 2 diabetes mellitus without complications: Secondary | ICD-10-CM

## 2017-05-14 DIAGNOSIS — Z79899 Other long term (current) drug therapy: Secondary | ICD-10-CM | POA: Diagnosis not present

## 2017-05-15 LAB — CBC WITH DIFFERENTIAL/PLATELET
BASOS PCT: 0.5 %
Basophils Absolute: 28 cells/uL (ref 0–200)
Eosinophils Absolute: 242 cells/uL (ref 15–500)
Eosinophils Relative: 4.4 %
HCT: 37.5 % — ABNORMAL LOW (ref 38.5–50.0)
Hemoglobin: 12.6 g/dL — ABNORMAL LOW (ref 13.2–17.1)
Lymphs Abs: 1573 cells/uL (ref 850–3900)
MCH: 30.4 pg (ref 27.0–33.0)
MCHC: 33.6 g/dL (ref 32.0–36.0)
MCV: 90.6 fL (ref 80.0–100.0)
MONOS PCT: 10.1 %
MPV: 9.6 fL (ref 7.5–12.5)
NEUTROS ABS: 3102 {cells}/uL (ref 1500–7800)
Neutrophils Relative %: 56.4 %
PLATELETS: 133 10*3/uL — AB (ref 140–400)
RBC: 4.14 10*6/uL — AB (ref 4.20–5.80)
RDW: 13 % (ref 11.0–15.0)
TOTAL LYMPHOCYTE: 28.6 %
WBC mixed population: 556 cells/uL (ref 200–950)
WBC: 5.5 10*3/uL (ref 3.8–10.8)

## 2017-05-15 LAB — COMPREHENSIVE METABOLIC PANEL
AG Ratio: 1.9 (calc) (ref 1.0–2.5)
ALBUMIN MSPROF: 4 g/dL (ref 3.6–5.1)
ALT: 13 U/L (ref 9–46)
AST: 16 U/L (ref 10–35)
Alkaline phosphatase (APISO): 59 U/L (ref 40–115)
BUN / CREAT RATIO: 14 (calc) (ref 6–22)
BUN: 19 mg/dL (ref 7–25)
CHLORIDE: 104 mmol/L (ref 98–110)
CO2: 26 mmol/L (ref 20–32)
CREATININE: 1.39 mg/dL — AB (ref 0.70–1.18)
Calcium: 9 mg/dL (ref 8.6–10.3)
GLOBULIN: 2.1 g/dL (ref 1.9–3.7)
GLUCOSE: 131 mg/dL — AB (ref 65–99)
POTASSIUM: 4.4 mmol/L (ref 3.5–5.3)
SODIUM: 139 mmol/L (ref 135–146)
TOTAL PROTEIN: 6.1 g/dL (ref 6.1–8.1)
Total Bilirubin: 0.5 mg/dL (ref 0.2–1.2)

## 2017-05-15 LAB — LIPID PANEL
CHOL/HDL RATIO: 4.1 (calc) (ref <5.0)
Cholesterol: 165 mg/dL (ref <200)
HDL: 40 mg/dL — AB (ref >40)
LDL Cholesterol (Calc): 99 mg/dL (calc)
NON-HDL CHOLESTEROL (CALC): 125 mg/dL (ref <130)
Triglycerides: 164 mg/dL — ABNORMAL HIGH (ref <150)

## 2017-05-15 LAB — HEMOGLOBIN A1C
Hgb A1c MFr Bld: 6.4 % of total Hgb — ABNORMAL HIGH (ref <5.7)
Mean Plasma Glucose: 137 (calc)
eAG (mmol/L): 7.6 (calc)

## 2017-05-17 ENCOUNTER — Encounter: Payer: Self-pay | Admitting: Family Medicine

## 2017-05-17 ENCOUNTER — Ambulatory Visit (INDEPENDENT_AMBULATORY_CARE_PROVIDER_SITE_OTHER): Payer: Medicare HMO | Admitting: Family Medicine

## 2017-05-17 VITALS — BP 150/80 | HR 66 | Temp 97.6°F | Resp 16 | Ht 70.5 in | Wt 227.0 lb

## 2017-05-17 DIAGNOSIS — E1165 Type 2 diabetes mellitus with hyperglycemia: Secondary | ICD-10-CM | POA: Diagnosis not present

## 2017-05-17 DIAGNOSIS — E119 Type 2 diabetes mellitus without complications: Secondary | ICD-10-CM

## 2017-05-17 DIAGNOSIS — E7800 Pure hypercholesterolemia, unspecified: Secondary | ICD-10-CM

## 2017-05-17 DIAGNOSIS — I2510 Atherosclerotic heart disease of native coronary artery without angina pectoris: Secondary | ICD-10-CM

## 2017-05-17 MED ORDER — DOXAZOSIN MESYLATE 2 MG PO TABS: 2.0000 mg | ORAL_TABLET | Freq: Every day | ORAL | 1 refills | Status: DC

## 2017-05-17 NOTE — Progress Notes (Signed)
Subjective:    Patient ID: Juan [name], male    DOB: January 10, 1941, 77 y.o.   MRN: [id number]  Hyperlipidemia   Diabetes   Medication Refill   06/02/16 At the patient's last visit, he was found to have bilateral carotid bruits. Carotid Doppler showed bilateral carotid artery stenosis most severe on the right with 50-69% stenosis.  Less than 50% on the left.  Lipitor was increased to 80 mg a day and vascular surgery was consulted.  Saw Dr. Oneida Alar who recommended recheck in 6 months.  During that 6 month period of time, the patient admits that his diet has been poor. He is very much eating whatever he wants. As result his LDL cholesterol has actually worsened even though he is on a higher dose of Lipitor. His hemoglobin A1c has risen from 6.3 to greater than 8. His liver tests have also become slightly inflamed suggesting fatty liver disease versus hepatitis secondary to the statin medication. At that time, my plan was: Decrease Lipitor to 40 mg a day due to elevated liver function test and recheck liver function tests in 2 weeks. Patient will aggressively pursue dietary changes. Discontinue glipizide and replaced with Jardiance 25 mg a day due to cardiovascular mortality reduction particularly given his known carotid artery stenosis. Recheck all labs in 3 months. In 2 weeks he will return for a CMP to monitor his liver function tests  06/23/16 Unfortunately on the repeat lab work, the patient's creatinine had increased to greater than 2 indicating acute on chronic renal insufficiency. His liver function test were over 200 and 100 respectfully.   Right upper quadrant ultrasound was obtained which revealed a right renal cyst and no abnormality in the liver. It was at that time the decision was made to discontinue Lipitor, hold metformin and Giardia and skin of the renal insufficiency, and recheck lab work in one week to see if lab abnormalities will improve. Over that time, the patient reports  worsening fatigue. He is also developed a cough productive of green sputum, chest congestion, subjective fevers. Patient has lost 14 pounds over the last 2-1/2 weeks although he says that he has tried to change his diet and exercise more. Patient appears very tired. He is in no acute distress. He does appear sickly.  AT that time, my plan was: I'm very concerned about the patient's worsening liver function test, worsening renal function, and 14 pound weight loss. Right quadrant ultrasound was reassuring however there is no explanation yet to the patient's problem. Repeat CMP today now that the patient's off Lipitor, metformin, and Giardia. Treat the patient for possible bronchitis sinusitis with a Z-Pak over the weekend. Recheck on Monday. Resume glipizide 5 mg by mouth twice a day as the patient's blood sugars are over 300 off his medication. If liver function tests continue to worsen and renal function tests continue to worsen, the patient will need an SPEP/UPEP, renal ultrasound is unnecessary because right upper quadrant ultrasound revealed no hydronephrosis, however I would perform a urinalysis, screen the patient for autoimmune hepatitis.  Given his age I do not believe the hemochromatosis is likely or Wilson's disease. Would consider screen the patient for viral hepatitis. Push fluids over the weekend. Recheck on Monday.  06/26/16 Patient states that he feels much better. He continues to have postnasal drip causing a cough and some sinus pressure and sinus drainage. The lab work obtained last week revealed:  . ALT dropped more than 100 points and AST dropped by 50% off  Lipitor. Creatinine also improved after holding Jardiance and metformin.  Blood pressure remains low. He also continues to have hyperglycemia. Blood sugars last week were over 300. Since starting glipizide they're now down to 250 this morning however he's only been on the medication for 48 hours.  At that time, my plan was: Patient seems  to be improving. Recheck CMP in one week. Hold hydrochlorothiazide given the acute renal insufficiency. If in one week, the patient's creatinine is back to normal, I will resume metformin. Hopefully the combination of metformin and glipizide will control his hyperglycemia. If the patient's liver function tests have not improved at that point I will discontinue allopurinol. If they have improved at that point, we will need to make a decision about when to resume a statin and at what dose.  07/04/17 Liver function tests have now normalized off Lipitor. Creatinine is approaching his baseline at 1.45. He feels back to normal. Unfortunately hemoglobin A1c has risen to 9.2 off metformin and Jardiance.  LDL cholesterol has risen greater than 130 off Lipitor. His goal LDL cholesterol is less than 70 given his history of 50% stenosis in the right internal carotid artery and coronary artery disease. At that time, my plan was: Thankfully, the patient is now back to his baseline. His elevated liver function tests seem to be secondary to Lipitor. We discussed his options to manage his diabetes. Patient is unable to afford name brand medication. He also does not want to try Actos. Therefore his option is limited to resuming metformin with the glipizide and rechecking hemoglobin A1c in 3 months. We will need to monitor his creatinine closely as he is borderline to use metformin. He understands the risk of lactic acidosis and he wishes to proceed. Regarding his cholesterol, I will have the patient resume Lipitor but only 20 mg, he was taking 80. Recheck liver function test/CMP in 2 weeks. Recheck hemoglobin A1c and fasting lipid panel in 3 months  05/17/17 Appointment on 05/14/2017  Component Date Value Ref Range Status  . WBC 05/14/2017 5.5  3.8 - 10.8 Thousand/uL Final  . RBC 05/14/2017 4.14* 4.20 - 5.80 Million/uL Final  . Hemoglobin 05/14/2017 12.6* 13.2 - 17.1 g/dL Final  . HCT 05/14/2017 37.5* 38.5 - 50.0 % Final  .  MCV 05/14/2017 90.6  80.0 - 100.0 fL Final  . MCH 05/14/2017 30.4  27.0 - 33.0 pg Final  . MCHC 05/14/2017 33.6  32.0 - 36.0 g/dL Final  . RDW 05/14/2017 13.0  11.0 - 15.0 % Final  . Platelets 05/14/2017 133* 140 - 400 Thousand/uL Final  . MPV 05/14/2017 9.6  7.5 - 12.5 fL Final  . Neutro Abs 05/14/2017 3,102  1,500 - 7,800 cells/uL Final  . Lymphs Abs 05/14/2017 1,573  850 - 3,900 cells/uL Final  . WBC mixed population 05/14/2017 556  200 - 950 cells/uL Final  . Eosinophils Absolute 05/14/2017 242  15 - 500 cells/uL Final  . Basophils Absolute 05/14/2017 28  0 - 200 cells/uL Final  . Neutrophils Relative % 05/14/2017 56.4  % Final  . Total Lymphocyte 05/14/2017 28.6  % Final  . Monocytes Relative 05/14/2017 10.1  % Final  . Eosinophils Relative 05/14/2017 4.4  % Final  . Basophils Relative 05/14/2017 0.5  % Final  . Glucose, Bld 05/14/2017 131* 65 - 99 mg/dL Final   Comment: .            Fasting reference interval . For someone without known diabetes, a glucose value >125  mg/dL indicates that they may have diabetes and this should be confirmed with a follow-up test. .   . BUN 05/14/2017 19  7 - 25 mg/dL Final  . Creat 05/14/2017 1.39* 0.70 - 1.18 mg/dL Final   Comment: For patients >63 years of age, the reference limit for Creatinine is approximately 13% higher for people identified as African-American. .   Havery Moros Ratio 05/14/2017 14  6 - 22 (calc) Final  . Sodium 05/14/2017 139  135 - 146 mmol/L Final  . Potassium 05/14/2017 4.4  3.5 - 5.3 mmol/L Final  . Chloride 05/14/2017 104  98 - 110 mmol/L Final  . CO2 05/14/2017 26  20 - 32 mmol/L Final  . Calcium 05/14/2017 9.0  8.6 - 10.3 mg/dL Final  . Total Protein 05/14/2017 6.1  6.1 - 8.1 g/dL Final  . Albumin 05/14/2017 4.0  3.6 - 5.1 g/dL Final  . Globulin 05/14/2017 2.1  1.9 - 3.7 g/dL (calc) Final  . AG Ratio 05/14/2017 1.9  1.0 - 2.5 (calc) Final  . Total Bilirubin 05/14/2017 0.5  0.2 - 1.2 mg/dL Final  .  Alkaline phosphatase (APISO) 05/14/2017 59  40 - 115 U/L Final  . AST 05/14/2017 16  10 - 35 U/L Final  . ALT 05/14/2017 13  9 - 46 U/L Final  . Hgb A1c MFr Bld 05/14/2017 6.4* <5.7 % of total Hgb Final   Comment: For someone without known diabetes, a hemoglobin  A1c value between 5.7% and 6.4% is consistent with prediabetes and should be confirmed with a  follow-up test. . For someone with known diabetes, a value <7% indicates that their diabetes is well controlled. A1c targets should be individualized based on duration of diabetes, age, comorbid conditions, and other considerations. . This assay result is consistent with an increased risk of diabetes. . Currently, no consensus exists regarding use of hemoglobin A1c for diagnosis of diabetes for children. .   . Mean Plasma Glucose 05/14/2017 137  (calc) Final  . eAG (mmol/L) 05/14/2017 7.6  (calc) Final  . Cholesterol 05/14/2017 165  <200 mg/dL Final  . HDL 05/14/2017 40* >40 mg/dL Final  . Triglycerides 05/14/2017 164* <150 mg/dL Final  . LDL Cholesterol (Calc) 05/14/2017 99  mg/dL (calc) Final   Comment: Reference range: <100 . Desirable range <100 mg/dL for primary prevention;   <70 mg/dL for patients with CHD or diabetic patients  with > or = 2 CHD risk factors. Marland Kitchen LDL-C is now calculated using the Martin-Hopkins  calculation, which is a validated novel method providing  better accuracy than the Friedewald equation in the  estimation of LDL-C.  Cresenciano Genre et al. Annamaria Helling. 9924;268(34): 2061-2068  (http://education.[facility].com/faq/FAQ164)   . Total CHOL/HDL Ratio 05/14/2017 4.1  <5.0 (calc) Final  . Non-HDL Cholesterol (Calc) 05/14/2017 125  <130 mg/dL (calc) Final   Comment: For patients with diabetes plus 1 major ASCVD risk  factor, treating to a non-HDL-C goal of <100 mg/dL  (LDL-C of <70 mg/dL) is considered a therapeutic  option.    Patient denies chest pain, sob, doe.  Denies polyuria, polydypsia, or  blurry vison.  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  Has bilateral carotid artery stenosis.  Has AAA that has been stable for years.  Denies myalgias or ruq pain.  Tolerating pravastatin well.   Past Medical History:  Diagnosis Date  . AAA (abdominal aortic aneurysm) ([place]) 8/12   3.5cm  . CAD (coronary artery disease)   . Colon polyps   . Diabetes mellitus  without complication ([place])   . 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 Hyperlipidemia   . Hypertension   . 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 Neuromuscular disorder ([place])    L2-3,L5-S1 bulging disc /nerve impingement  . PAD (peripheral artery disease) ([place])    Past Surgical History:  Procedure Laterality Date  . COLONOSCOPY N/A 09/27/2012   Procedure: COLONOSCOPY;  Surgeon: Daneil Dolin, MD;  Location: [facility];  Service: Endoscopy;  Laterality: N/A;  9:30  . CORONARY ANGIOPLASTY     1989  . TONSILLECTOMY      Current Outpatient Medications on File Prior to Visit  Medication Sig Dispense Refill  . allopurinol (ZYLOPRIM) 300 MG tablet TAKE 1 TABLET EVERY DAY (NEED MD APPOINTMENT) 90 tablet 3  . aspirin 325 MG EC tablet Take 325 mg by mouth daily.    . Blood Glucose Monitoring Suppl (TRUE METRIX METER) w/Device KIT Use as Directed 1 kit 1  . CARTIA XT 240 MG 24 hr capsule TAKE 1 CAPSULE EVERY DAY 90 capsule 3  . glipiZIDE (GLUCOTROL) 5 MG tablet TAKE 1 TABLET TWO TIMES DAILY BEFORE A MEAL 180 tablet 3  . glucose blood (TRUE METRIX BLOOD GLUCOSE TEST) test strip Check BS QD - dx: e11.9 100 each 12  . Lancet Devices (PRODIGY LANCING DEVICE) MISC Use bid DX e11.9 1 each 2  . lisinopril (PRINIVIL,ZESTRIL) 40 MG tablet TAKE 1 TABLET EVERY DAY (NEED MD APPOINTMENT) 90 tablet 3  . metFORMIN (GLUCOPHAGE) 500 MG tablet TAKE 1 TABLET TWICE DAILY WITH MEALS (NEED MD APPOINTMENT) 180 tablet 3  . metoprolol succinate (TOPROL-XL) 25 MG 24 hr tablet TAKE 2 TABLETS EVERY DAY 180 tablet  3  . Multiple Vitamin (MULTIVITAMIN WITH MINERALS) TABS Take 1 tablet by mouth daily.    . pravastatin (PRAVACHOL) 40 MG tablet Take 1 tablet (40 mg total) by mouth daily. 90 tablet 3  . TRUEPLUS LANCETS 33G MISC Check BS QD - dx: e11.9 100 each 3   No current facility-administered medications on file prior to visit.    No Known Allergies Social History   Socioeconomic History  . Marital status: Married    Spouse name: Not on file  . Number of children: Not on file  . Years of education: Not on file  . Highest education level: Not on file  Social Needs  . Financial resource strain: Not on file  . Food insecurity - worry: Not on file  . Food insecurity - inability: Not on file  . Transportation needs - medical: Not on file  . Transportation needs - non-medical: Not on file  Occupational History  . Occupation: retired    Fish farm manager: RETIRED    Comment: [employer]   Tobacco Use  . Smoking status: Former Smoker    Years: 35.00    Types: Cigarettes    Last attempt to quit: 01/02/1988    Years since quitting: 29.3  . Smokeless tobacco: Never Used  Substance and Sexual Activity  . Alcohol use: Yes    Comment: rare beer, no history of ETOH abuse  . Drug use: No  . Sexual activity: Not on file  Other Topics Concern  . Not on file  Social History Narrative  . Not on file   Family History  Problem Relation Age of Onset  . Diabetes Mother   . Heart disease Mother        After age 72  .  Heart attack Mother   . Hypertension Mother   . Diabetes Father   . Heart disease Father        After age 21  . Hypertension Father   . Heart attack Father   . Brain cancer Sister   . Cancer Sister        Brain  . Diabetes Sister   . Hypertension Sister   . Cancer Brother        Chest Tumor  . Diabetes Brother   . Hypertension Brother   . Deep vein thrombosis Sister   . Diabetes Sister   . Diabetes Brother        Bilateral leg  . Colon cancer Neg Hx       Review of Systems    All other systems reviewed and are negative.      Objective:   Physical Exam  Constitutional: He is oriented to person, place, and time. He appears well-developed and well-nourished.  Non-toxic appearance. He does not have a sickly appearance. He does not appear ill. No distress.  HENT:  Head: Normocephalic and atraumatic.  Right Ear: External ear normal.  Left Ear: External ear normal.  Nose: No mucosal edema or rhinorrhea.  Mouth/Throat: Oropharynx is clear and moist. No oropharyngeal exudate.  Eyes: Conjunctivae are normal. Pupils are equal, round, and reactive to light. Right eye exhibits no discharge. Left eye exhibits no discharge. No scleral icterus.  Neck: Normal range of motion. Neck supple. No JVD present. No tracheal deviation present. No thyromegaly present.  Cardiovascular: Normal rate, regular rhythm, normal heart sounds and intact distal pulses. Exam reveals no gallop and no friction rub.  No murmur heard. Pulmonary/Chest: Effort normal and breath sounds normal. No stridor. No respiratory distress. He has no wheezes. He has no rales. He exhibits no tenderness.  Abdominal: Soft. Bowel sounds are normal. He exhibits no distension and no mass. There is no tenderness. There is no rebound and no guarding.  Musculoskeletal: Normal range of motion. He exhibits no edema or tenderness.  Lymphadenopathy:    He has no cervical adenopathy.  Neurological: He is alert and oriented to person, place, and time. He has normal reflexes. No cranial nerve deficit. He exhibits normal muscle tone. Coordination normal.  Skin: Skin is warm. No rash noted. He is not diaphoretic. No erythema. No pallor.  Psychiatric: He has a normal mood and affect. His behavior is normal. Judgment and thought content normal.  Vitals reviewed.         Assessment & Plan:  Controlled type 2 diabetes mellitus without complication, without long-term current use of insulin (HCC)  Type 2 diabetes mellitus with  hyperglycemia, without long-term current use of insulin (HCC)  ASCVD (arteriosclerotic cardiovascular disease)  Pure hypercholesterolemia  Renal fcn is worse and BP is too high, add doxazosin 2 mg poqday.  Recheck bp in 1 month.  Hga1c is excellent.  LDL is above goal of 99.  Patient could not tolerate high intensity statin due to elevated LFT's.  Wants to work on diet and exercise and recheck in 3 -6 ,months.

## 2017-06-06 DIAGNOSIS — H2513 Age-related nuclear cataract, bilateral: Secondary | ICD-10-CM | POA: Diagnosis not present

## 2017-06-06 DIAGNOSIS — H52203 Unspecified astigmatism, bilateral: Secondary | ICD-10-CM | POA: Diagnosis not present

## 2017-06-06 DIAGNOSIS — H5203 Hypermetropia, bilateral: Secondary | ICD-10-CM | POA: Diagnosis not present

## 2017-06-06 DIAGNOSIS — E119 Type 2 diabetes mellitus without complications: Secondary | ICD-10-CM | POA: Diagnosis not present

## 2017-06-06 DIAGNOSIS — H524 Presbyopia: Secondary | ICD-10-CM | POA: Diagnosis not present

## 2017-06-06 DIAGNOSIS — Z7984 Long term (current) use of oral hypoglycemic drugs: Secondary | ICD-10-CM | POA: Diagnosis not present

## 2017-06-06 LAB — HM DIABETES EYE EXAM

## 2017-06-07 ENCOUNTER — Encounter: Payer: Self-pay | Admitting: *Deleted

## 2017-06-10 ENCOUNTER — Encounter: Payer: Self-pay | Admitting: Family Medicine

## 2017-07-08 ENCOUNTER — Encounter: Payer: Self-pay | Admitting: Family Medicine

## 2017-07-09 MED ORDER — DOXAZOSIN MESYLATE 4 MG PO TABS: 4.0000 mg | ORAL_TABLET | Freq: Every day | ORAL | 3 refills | Status: DC

## 2017-07-13 MED ORDER — HYDROCHLOROTHIAZIDE 12.5 MG PO TABS: 12.5000 mg | ORAL_TABLET | Freq: Every day | ORAL | 3 refills | Status: DC

## 2017-07-13 NOTE — Addendum Note (Signed)
Addended by: Sheral Flow on: 07/13/2017 10:30 AM   Modules accepted: Orders

## 2017-08-02 ENCOUNTER — Ambulatory Visit (HOSPITAL_COMMUNITY)
Admission: RE | Admit: 2017-08-02 | Discharge: 2017-08-02 | Disposition: A | Discharge status: Home / Self Care | Payer: Medicare HMO | Source: Ambulatory Visit | Attending: Family | Admitting: Family

## 2017-08-02 ENCOUNTER — Encounter: Payer: Self-pay | Admitting: Family

## 2017-08-02 ENCOUNTER — Ambulatory Visit: Payer: Medicare HMO | Admitting: Family

## 2017-08-02 ENCOUNTER — Ambulatory Visit (INDEPENDENT_AMBULATORY_CARE_PROVIDER_SITE_OTHER)
Admission: RE | Admit: 2017-08-02 | Discharge: 2017-08-02 | Disposition: A | Discharge status: Home / Self Care | Payer: Medicare HMO | Source: Ambulatory Visit | Attending: Family | Admitting: Family

## 2017-08-02 ENCOUNTER — Other Ambulatory Visit: Payer: Self-pay

## 2017-08-02 VITALS — BP 175/78 | HR 64 | Temp 97.0°F | Resp 16 | Ht 70.5 in | Wt 223.0 lb

## 2017-08-02 DIAGNOSIS — I6521 Occlusion and stenosis of right carotid artery: Secondary | ICD-10-CM

## 2017-08-02 DIAGNOSIS — I714 Abdominal aortic aneurysm, without rupture, unspecified: Secondary | ICD-10-CM

## 2017-08-02 DIAGNOSIS — E119 Type 2 diabetes mellitus without complications: Secondary | ICD-10-CM | POA: Diagnosis not present

## 2017-08-02 DIAGNOSIS — I2510 Atherosclerotic heart disease of native coronary artery without angina pectoris: Secondary | ICD-10-CM | POA: Insufficient documentation

## 2017-08-02 DIAGNOSIS — R0989 Other specified symptoms and signs involving the circulatory and respiratory systems: Secondary | ICD-10-CM | POA: Insufficient documentation

## 2017-08-02 DIAGNOSIS — I10 Essential (primary) hypertension: Secondary | ICD-10-CM

## 2017-08-02 NOTE — Patient Instructions (Addendum)
Before your next abdominal ultrasound:  Take two Extra-Strength Gas-X capsules at bedtime the night before the test. Take another two Extra-Strength Gas-X capsules 3 hours before the test.  Avoid gas forming foods and beverages the day before the test.      Abdominal Aortic Aneurysm Blood pumps away from the heart through tubes (blood vessels) called arteries. Aneurysms are weak or damaged places in the wall of an artery. It bulges out like a balloon. An abdominal aortic aneurysm happens in the main artery of the body (aorta). It can burst or tear, causing bleeding inside the body. This is an emergency. It needs treatment right away. What are the causes? The exact cause is unknown. Things that could cause this problem include:  Fat and other substances building up in the lining of a tube.  Swelling of the walls of a blood vessel.  Certain tissue diseases.  Belly (abdominal) trauma.  An infection in the main artery of the body.  What increases the risk? There are things that make it more likely for you to have an aneurysm. These include:  Being over the age of 79 years old.  Having high blood pressure (hypertension).  Being a male.  Being white.  Being very overweight (obese).  Having a family history of aneurysm.  Using tobacco products.  What are the signs or symptoms? Symptoms depend on the size of the aneurysm and how fast it grows. There may not be symptoms. If symptoms occur, they can include:  Pain (belly, side, lower back, or groin).  Feeling full after eating a small amount of food.  Feeling sick to your stomach (nauseous), throwing up (vomiting), or both.  Feeling a lump in your belly that feels like it is beating (pulsating).  Feeling like you will pass out (faint).  How is this treated?  Medicine to control blood pressure and pain.  Imaging tests to see if the aneurysm gets bigger.  Surgery. How is this prevented? To lessen your chance of  getting this condition:  Stop smoking. Stop chewing tobacco.  Limit or avoid alcohol.  Keep your blood pressure, blood sugar, and cholesterol within normal limits.  Eat less salt.  Eat foods low in saturated fats and cholesterol. These are found in animal and whole dairy products.  Eat more fiber. Fiber is found in whole grains, vegetables, and fruits.  Keep a healthy weight.  Stay active and exercise often.  This information is not intended to replace advice given to you by your health care provider. Make sure you discuss any questions you have with your health care provider. Document Released: 06/24/2012 Document Revised: 08/05/2015 Document Reviewed: 03/29/2012 Elsevier Interactive Patient Education  2017 [employer]  

## 2017-08-02 NOTE — Progress Notes (Signed)
Vitals:   08/02/17 0938  BP: (!) 164/74  Pulse: 64  Resp: 16  Temp: (!) 97 F (36.1 C)  TempSrc: Oral  SpO2: 98%  Weight: 223 lb (101.2 kg)  Height: 5' 10.5" (1.791 m)

## 2017-08-02 NOTE — Progress Notes (Signed)
[facility] [place]   CC: Follow up Abdominal Aortic Aneurysm  History of Present Illness  Juan [name] is a 77 y.o. (02-15-1941) male who had a carotid duplex scan at [facility] for evaluation of an asymptomatic carotidbruit. Right side was 50-69%, left side less than 50% antegrade vertebral flow bilaterally. This ultrasound was done on 12/17/2015. Other medical problems include 3.3 cm AAA 2/16. He has been followed for the abdominal aortic aneurysm by [place] for several years.  Dr. Oneida Alar evaluated pt on 01-19-16.   The patient does not have back or abdominal pain. 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 He walks about a mile daily, denies claudications symptoms.  The patient denies history of stroke or TIA symptoms.  He had balloon angioplasty of a coronary artery in 1989 and stopped smoking then; patient denies history of MI. Patient states he is working with his PCP on getting his blood pressure under better control, states it runs 161-096'E systolic.   He denies dyspnea, denies chest pain.  His right hip hurts after about 30 minutes walking.   He states his PCP is adjusting his blood pressure mediations; he takes 5 mediations that lower blood pressure.   He takes a daily 325 mg ASA, no other antiplatelet agents nor anticoagulants.  He takes a statin, pravastatin.   Diabetic: Yes, 6.4 on 05-14-17, improved greatly from 9.2 A1C on 07-03-16 (review of records) Tobacco use: former smoker, quit at age 89   Past Medical History:  Diagnosis Date  . AAA (abdominal aortic aneurysm) ([place]) 8/12   3.5cm  . CAD (coronary artery disease)   . Colon polyps   . Diabetes mellitus without complication ([place])   . 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 Hyperlipidemia   . Hypertension   . 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 Neuromuscular disorder ([place])     L2-3,L5-S1 bulging disc /nerve impingement  . PAD (peripheral artery disease) ([place])    Past Surgical History:  Procedure Laterality Date  . COLONOSCOPY N/A 09/27/2012   Procedure: COLONOSCOPY;  Surgeon: Daneil Dolin, MD;  [facility];  Service: Endoscopy;  Laterality: N/A;  9:30  . CORONARY ANGIOPLASTY     1989  . TONSILLECTOMY     Social History Social History   Socioeconomic History  . Marital status: Married    Spouse name: Not on file  . Number of children: Not on file  . Years of education: Not on file  . Highest education level: Not on file  Occupational History  . Occupation: retired    Fish farm manager: RETIRED    Comment: [employer]   Social Needs  . Financial resource strain: Not on file  . Food insecurity:    Worry: Not on file    Inability: Not on file  . Transportation needs:    Medical: Not on file    Non-medical: Not on file  Tobacco Use  . Smoking status: Former Smoker    Years: 35.00    Types: Cigarettes    Last attempt to quit: 01/02/1988    Years since quitting: 29.6  . Smokeless tobacco: Never Used  Substance and Sexual Activity  . Alcohol use: Yes    Comment: rare beer, no history of ETOH abuse  . Drug use: No  . Sexual activity: Not on file  Lifestyle  . Physical activity:    Days  per week: Not on file    Minutes per session: Not on file  . Stress: Not on file  Relationships  . Social connections:    Talks on phone: Not on file    Gets together: Not on file    Attends religious service: Not on file    Active member of club or organization: Not on file    Attends meetings of clubs or organizations: Not on file    Relationship status: Not on file  . Intimate partner violence:    Fear of current or ex partner: Not on file    Emotionally abused: Not on file    Physically abused: Not on file    Forced sexual activity: Not on file  Other Topics Concern  . Not on file  Social History Narrative  . Not on file   Family  History Family History  Problem Relation Age of Onset  . Diabetes Mother   . Heart disease Mother        After age 42  . Heart attack Mother   . Hypertension Mother   . Diabetes Father   . Heart disease Father        After age 52  . Hypertension Father   . Heart attack Father   . Brain cancer Sister   . Cancer Sister        Brain  . Diabetes Sister   . Hypertension Sister   . Cancer Brother        Chest Tumor  . Diabetes Brother   . Hypertension Brother   . Deep vein thrombosis Sister   . Diabetes Sister   . Diabetes Brother        Bilateral leg  . Colon cancer Neg Hx     Current Outpatient Medications on File Prior to Visit  Medication Sig Dispense Refill  . allopurinol (ZYLOPRIM) 300 MG tablet TAKE 1 TABLET EVERY DAY (NEED MD APPOINTMENT) 90 tablet 3  . aspirin 325 MG EC tablet Take 325 mg by mouth daily.    . Blood Glucose Monitoring Suppl (TRUE METRIX METER) w/Device KIT Use as Directed 1 kit 1  . CARTIA XT 240 MG 24 hr capsule TAKE 1 CAPSULE EVERY DAY 90 capsule 3  . doxazosin (CARDURA) 4 MG tablet Take 1 tablet (4 mg total) by mouth daily. 30 tablet 3  . glipiZIDE (GLUCOTROL) 5 MG tablet TAKE 1 TABLET TWO TIMES DAILY BEFORE A MEAL 180 tablet 3  . glucose blood (TRUE METRIX BLOOD GLUCOSE TEST) test strip Check BS QD - dx: e11.9 100 each 12  . hydrochlorothiazide (HYDRODIURIL) 12.5 MG tablet Take 1 tablet (12.5 mg total) by mouth daily. 90 tablet 3  . Lancet Devices (PRODIGY LANCING DEVICE) MISC Use bid DX e11.9 1 each 2  . lisinopril (PRINIVIL,ZESTRIL) 40 MG tablet TAKE 1 TABLET EVERY DAY (NEED MD APPOINTMENT) 90 tablet 3  . metFORMIN (GLUCOPHAGE) 500 MG tablet TAKE 1 TABLET TWICE DAILY WITH MEALS (NEED MD APPOINTMENT) 180 tablet 3  . metoprolol succinate (TOPROL-XL) 25 MG 24 hr tablet TAKE 2 TABLETS EVERY DAY 180 tablet 3  . Multiple Vitamin (MULTIVITAMIN WITH MINERALS) TABS Take 1 tablet by mouth daily.    . pravastatin (PRAVACHOL) 40 MG tablet Take 1 tablet (40 mg  total) by mouth daily. 90 tablet 3  . TRUEPLUS LANCETS 33G MISC Check BS QD - dx: e11.9 100 each 3   No current facility-administered medications on file prior to visit.    No Known  Allergies  ROS: See HPI for pertinent positives and negatives.  Physical Examination  Vitals:   08/02/17 0938 08/02/17 0951  BP: (!) 164/74 (!) 175/78  Pulse: 64 64  Resp: 16   Temp: (!) 97 F (36.1 C)   TempSrc: Oral   SpO2: 98%   Weight: 223 lb (101.2 kg)   Height: 5' 10.5" (1.791 m)    Body mass index is 31.54 kg/m.   General: A&O x 3, WD, obese male. HEENT: Grossly intact and WNL.  Pulmonary: Sym exp, respirations are non labored, good air movt, few rales in bases, rhonchi, or wheezing. Cardiac: Regular rhythm and rate, no detected murmur.  Carotid Bruits Right Left   Negative Negative   Adominal aortic pulse is not palpable Radial pulses are 2+                          VASCULAR EXAM:                                                                                                         LE Pulses Right Left       FEMORAL  2+ palpable  1+ palpable        POPLITEAL  1+ palpable   3+ palpable       POSTERIOR TIBIAL  2+ palpable   2+ palpable        DORSALIS PEDIS      ANTERIOR TIBIAL 1+ palpable  not palpable     Gastrointestinal: soft, NTND, -G/R, - HSM, - masses palpated, - CVAT B. Musculoskeletal: M/S 5/5 throughout, Extremities without ischemic changes. Skin: No rashes, no ulcers, no cellulitis.  Bilateral tinea pedis on soles only. Neurologic: CN 2-12 intact except is hard of hearing, Pain and light touch intact in extremities are intact, Motor exam as listed above. Psychiatric: Normal thought content, mood appropriate to clinical situation.    DATA  AAA Duplex (08/02/2017):  Previous size:(Date: 07-27-16) Saccular aneurysm at the distal aorta with maximal diameter of 4.0 cm. Elevated velocities in the in the common iliac arteries suggest >50% stenosis bilaterally.    Current size:  4.25 cm (Date: 08-02-17); Right CIA: 1.8 cm (324 cm/s PSV); Left CIA: 1.1 cm (209 cm/s PSV).  Bilateral CIA with >50% stenosis  Bilateral Popliteal Artery Duplex (08/02/17): No evidence of popliteal artery aneurysm bilaterally.   Carotid Duplex (07-27-16): Right ICA: <40% Left ICA: no stenosis. Bilateral vertebral artery flow is antegrade.  Bilateral subclavian artery waveforms are normal.  No previous exam at this facility for comparison.    Medical Decision Making  The patient is a 77 y.o. male who presents with asymptomatic AAA with 0.25 cm increase in size in a year, at 4.25 cm today.   Based on this patient's exam and diagnostic studies, the patient will follow up in 2 months for bilateral renal artery duplex since he is taking 5 medications that lower his blood pressure and is still uncontrolled.     Follow up in 1 year  with the following  studies: AAA duplex, and carotid duplex.  Consideration for repair of AAA would be made when the size is 5.0 cm, growth > 1 cm/yr, and symptomatic status.        The patient was given information about AAA including signs, symptoms, treatment, and how to minimize the risk of enlargement and rupture of aneurysms.    I emphasized the importance of maximal medical management including strict control of blood pressure, blood glucose, and lipid levels, antiplatelet agents, obtaining regular exercise, and continued cessation of smoking.   The patient was advised to call 911 should the patient experience sudden onset abdominal or back pain.   Thank you for allowing [place] to participate in this patient's care.  Clemon Chambers, RN, MSN, FNP-C [facility] [place] Office: [phone]  Clinic Physician: Scot Dock  08/02/2017, 10:09 AM

## 2017-08-17 ENCOUNTER — Encounter: Payer: Self-pay | Admitting: Internal Medicine

## 2017-08-20 ENCOUNTER — Other Ambulatory Visit: Payer: Self-pay

## 2017-08-20 ENCOUNTER — Ambulatory Visit (INDEPENDENT_AMBULATORY_CARE_PROVIDER_SITE_OTHER): Payer: Medicare HMO | Admitting: Family Medicine

## 2017-08-20 ENCOUNTER — Encounter: Payer: Self-pay | Admitting: Family Medicine

## 2017-08-20 VITALS — BP 156/66 | HR 68 | Temp 97.6°F | Resp 16 | Ht 70.5 in | Wt 230.0 lb

## 2017-08-20 DIAGNOSIS — I2510 Atherosclerotic heart disease of native coronary artery without angina pectoris: Secondary | ICD-10-CM

## 2017-08-20 DIAGNOSIS — I10 Essential (primary) hypertension: Secondary | ICD-10-CM

## 2017-08-20 DIAGNOSIS — E119 Type 2 diabetes mellitus without complications: Secondary | ICD-10-CM

## 2017-08-20 DIAGNOSIS — I739 Peripheral vascular disease, unspecified: Secondary | ICD-10-CM

## 2017-08-20 DIAGNOSIS — E7800 Pure hypercholesterolemia, unspecified: Secondary | ICD-10-CM | POA: Diagnosis not present

## 2017-08-20 MED ORDER — METOPROLOL SUCCINATE ER 25 MG PO TB24: 50.0000 mg | ORAL_TABLET | Freq: Every day | ORAL | 3 refills | Status: DC

## 2017-08-20 MED ORDER — DILTIAZEM HCL ER COATED BEADS 240 MG PO CP24: 240.0000 mg | ORAL_CAPSULE | Freq: Every day | ORAL | 3 refills | Status: DC

## 2017-08-20 MED ORDER — LISINOPRIL 40 MG PO TABS: ORAL_TABLET | ORAL | 3 refills | Status: DC

## 2017-08-20 MED ORDER — DOXAZOSIN MESYLATE 4 MG PO TABS
4.0000 mg | ORAL_TABLET | Freq: Every day | ORAL | 3 refills | Status: DC
Start: 2017-08-20 — End: 2018-06-12

## 2017-08-20 MED ORDER — HYDROCHLOROTHIAZIDE 12.5 MG PO TABS: 12.5000 mg | ORAL_TABLET | Freq: Every day | ORAL | 3 refills | Status: DC

## 2017-08-20 NOTE — Progress Notes (Signed)
Subjective:    Patient ID: Juan [name], male    DOB: Apr 02, 1940, 77 y.o.   MRN: [id number]  Medication Refill   Hyperlipidemia   Diabetes   Hypertension   Since I last saw the patient, we have been changing his medication to try to get better control of his blood pressure.  He is currently on a combination of metoprolol, diltiazem, lisinopril, hydrochlorothiazide, and now Cardura.  Although his blood pressure here today is elevated, he brings in 1 month worth of blood pressure readings.  He has more than 10 values.  9 out of 10 are outstanding and less than 140/90.  He does have one isolated 150/90 but given his age, I believe this is acceptable because he also has 2 different values with his systolic blood pressure barely above 100.  Therefore I believe we have controlled his blood pressure as well as possible.  We also confirmed the accuracy of his blood pressure monitor here this morning.  He has a history of peripheral vascular disease and recently was found to have stenosis in both iliac arteries.  He denies any claudication in his legs.  However the vein and vascular doctors are scheduling the patient to evaluate for renal artery stenosis per his report.  He denies any chest pain shortness of breath or dyspnea on exertion.  He denies any myalgias or right upper quadrant pain.  He denies any polyuria, polydipsia, or blurry vision  Past Medical History:  Diagnosis Date  . AAA (abdominal aortic aneurysm) ([place]) 8/12   3.5cm  . CAD (coronary artery disease)   . Colon polyps   . Diabetes mellitus without complication ([place])   . 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 Hyperlipidemia   . Hypertension   . 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 Neuromuscular disorder ([place])    L2-3,L5-S1 bulging disc /nerve impingement  . PAD (peripheral artery disease) ([place])    Past Surgical History:  Procedure Laterality Date  . COLONOSCOPY  N/A 09/27/2012   Procedure: COLONOSCOPY;  Surgeon: Daneil Dolin, MD;  [facility];  Service: Endoscopy;  Laterality: N/A;  9:30  . CORONARY ANGIOPLASTY     1989  . TONSILLECTOMY      Current Outpatient Medications on File Prior to Visit  Medication Sig Dispense Refill  . allopurinol (ZYLOPRIM) 300 MG tablet TAKE 1 TABLET EVERY DAY (NEED MD APPOINTMENT) 90 tablet 3  . aspirin 325 MG EC tablet Take 325 mg by mouth daily.    . Blood Glucose Monitoring Suppl (TRUE METRIX METER) w/Device KIT Use as Directed 1 kit 1  . glipiZIDE (GLUCOTROL) 5 MG tablet TAKE 1 TABLET TWO TIMES DAILY BEFORE A MEAL 180 tablet 3  . glucose blood (TRUE METRIX BLOOD GLUCOSE TEST) test strip Check BS QD - dx: e11.9 100 each 12  . Lancet Devices (PRODIGY LANCING DEVICE) MISC Use bid DX e11.9 1 each 2  . metFORMIN (GLUCOPHAGE) 500 MG tablet TAKE 1 TABLET TWICE DAILY WITH MEALS (NEED MD APPOINTMENT) 180 tablet 3  . Multiple Vitamin (MULTIVITAMIN WITH MINERALS) TABS Take 1 tablet by mouth daily.    . pravastatin (PRAVACHOL) 40 MG tablet Take 1 tablet (40 mg total) by mouth daily. 90 tablet 3  . TRUEPLUS LANCETS 33G MISC Check BS QD - dx: e11.9 100 each 3   No current facility-administered medications on file prior to visit.    No Known Allergies  Social History   Socioeconomic History  . Marital status: Married    Spouse name: Not on file  . Number of children: Not on file  . Years of education: Not on file  . Highest education level: Not on file  Occupational History  . Occupation: retired    Fish farm manager: RETIRED    Comment: [employer]   Social Needs  . Financial resource strain: Not on file  . Food insecurity:    Worry: Not on file    Inability: Not on file  . Transportation needs:    Medical: Not on file    Non-medical: Not on file  Tobacco Use  . Smoking status: Former Smoker    Years: 35.00    Types: Cigarettes    Last attempt to quit: 01/02/1988    Years since quitting: 29.6  .  Smokeless tobacco: Never Used  Substance and Sexual Activity  . Alcohol use: Yes    Comment: rare beer, no history of ETOH abuse  . Drug use: No  . Sexual activity: Not on file  Lifestyle  . Physical activity:    Days per week: Not on file    Minutes per session: Not on file  . Stress: Not on file  Relationships  . Social connections:    Talks on phone: Not on file    Gets together: Not on file    Attends religious service: Not on file    Active member of club or organization: Not on file    Attends meetings of clubs or organizations: Not on file    Relationship status: Not on file  . Intimate partner violence:    Fear of current or ex partner: Not on file    Emotionally abused: Not on file    Physically abused: Not on file    Forced sexual activity: Not on file  Other Topics Concern  . Not on file  Social History Narrative  . Not on file   Family History  Problem Relation Age of Onset  . Diabetes Mother   . Heart disease Mother        After age 38  . Heart attack Mother   . Hypertension Mother   . Diabetes Father   . Heart disease Father        After age 55  . Hypertension Father   . Heart attack Father   . Brain cancer Sister   . Cancer Sister        Brain  . Diabetes Sister   . Hypertension Sister   . Cancer Brother        Chest Tumor  . Diabetes Brother   . Hypertension Brother   . Deep vein thrombosis Sister   . Diabetes Sister   . Diabetes Brother        Bilateral leg  . Colon cancer Neg Hx       Review of Systems  All other systems reviewed and are negative.      Objective:   Physical Exam  Constitutional: He is oriented to person, place, and time. He appears well-developed and well-nourished.  Non-toxic appearance. He does not have a sickly appearance. He does not appear ill. No distress.  HENT:  Head: Normocephalic and atraumatic.  Right Ear: External ear normal.  Left Ear: External ear normal.  Nose: No mucosal edema or rhinorrhea.    Mouth/Throat: Oropharynx is clear and moist. No oropharyngeal exudate.  Eyes: Pupils are equal, round, and reactive to light. Conjunctivae are normal.  Right eye exhibits no discharge. Left eye exhibits no discharge. No scleral icterus.  Neck: Normal range of motion. Neck supple. No JVD present. No tracheal deviation present. No thyromegaly present.  Cardiovascular: Normal rate, regular rhythm, normal heart sounds and intact distal pulses. Exam reveals no gallop and no friction rub.  No murmur heard. Pulmonary/Chest: Effort normal and breath sounds normal. No stridor. No respiratory distress. He has no wheezes. He has no rales. He exhibits no tenderness.  Abdominal: Soft. Bowel sounds are normal. He exhibits no distension and no mass. There is no tenderness. There is no rebound and no guarding.  Musculoskeletal: Normal range of motion. He exhibits no edema or tenderness.  Lymphadenopathy:    He has no cervical adenopathy.  Neurological: He is alert and oriented to person, place, and time. He has normal reflexes. No cranial nerve deficit. He exhibits normal muscle tone. Coordination normal.  Skin: Skin is warm. No rash noted. He is not diaphoretic. No erythema. No pallor.  Psychiatric: He has a normal mood and affect. His behavior is normal. Judgment and thought content normal.  Vitals reviewed.         Assessment & Plan:  Controlled type 2 diabetes mellitus without complication, without long-term current use of insulin ([place]) - Plan: CBC with Differential/Platelet, COMPLETE METABOLIC PANEL WITH GFR, Lipid panel, Microalbumin, urine, Hemoglobin A1c  ASCVD (arteriosclerotic cardiovascular disease)  Pure hypercholesterolemia  Essential hypertension  PAD (peripheral artery disease) ([facility])  Blood pressures acceptable.  Despite his blood pressure being elevated today, his home readings are excellent over the last month.  I will make no changes in his blood pressure medication.  We will check  a fasting lipid panel.  Goal LDL cholesterol is less than 70 given his history of arteriosclerotic cardiovascular disease including peripheral artery disease as well as coronary artery disease.  I will also check hemoglobin A1c.  His goal hemoglobin A1c is less than 6.5.  I will also check a urine microalbumin.  His immunizations are up-to-date.

## 2017-08-21 LAB — MICROALBUMIN, URINE: Microalb, Ur: 3.1 mg/dL

## 2017-08-21 LAB — LIPID PANEL
CHOL/HDL RATIO: 4.1 (calc) (ref <5.0)
Cholesterol: 150 mg/dL (ref <200)
HDL: 37 mg/dL — AB (ref >40)
LDL Cholesterol (Calc): 87 mg/dL (calc)
Non-HDL Cholesterol (Calc): 113 mg/dL (calc) (ref <130)
Triglycerides: 164 mg/dL — ABNORMAL HIGH (ref <150)

## 2017-08-21 LAB — COMPLETE METABOLIC PANEL WITH GFR
AG RATIO: 2 (calc) (ref 1.0–2.5)
ALBUMIN MSPROF: 4.3 g/dL (ref 3.6–5.1)
ALT: 15 U/L (ref 9–46)
AST: 16 U/L (ref 10–35)
Alkaline phosphatase (APISO): 57 U/L (ref 40–115)
BUN / CREAT RATIO: 14 (calc) (ref 6–22)
BUN: 20 mg/dL (ref 7–25)
CO2: 26 mmol/L (ref 20–32)
CREATININE: 1.44 mg/dL — AB (ref 0.70–1.18)
Calcium: 9.1 mg/dL (ref 8.6–10.3)
Chloride: 102 mmol/L (ref 98–110)
GFR, Est African American: 53 mL/min/{1.73_m2} — ABNORMAL LOW (ref >=60)
GFR, Est Non African American: 46 mL/min/{1.73_m2} — ABNORMAL LOW (ref >=60)
GLOBULIN: 2.1 g/dL (ref 1.9–3.7)
Glucose, Bld: 154 mg/dL — ABNORMAL HIGH (ref 65–99)
POTASSIUM: 4.1 mmol/L (ref 3.5–5.3)
SODIUM: 137 mmol/L (ref 135–146)
Total Bilirubin: 0.5 mg/dL (ref 0.2–1.2)
Total Protein: 6.4 g/dL (ref 6.1–8.1)

## 2017-08-21 LAB — CBC WITH DIFFERENTIAL/PLATELET
BASOS ABS: 19 {cells}/uL (ref 0–200)
Basophils Relative: 0.3 %
EOS ABS: 158 {cells}/uL (ref 15–500)
Eosinophils Relative: 2.5 %
HEMATOCRIT: 35.4 % — AB (ref 38.5–50.0)
HEMOGLOBIN: 12.3 g/dL — AB (ref 13.2–17.1)
LYMPHS ABS: 1481 {cells}/uL (ref 850–3900)
MCH: 31.1 pg (ref 27.0–33.0)
MCHC: 34.7 g/dL (ref 32.0–36.0)
MCV: 89.4 fL (ref 80.0–100.0)
MPV: 9.7 fL (ref 7.5–12.5)
Monocytes Relative: 9.4 %
NEUTROS ABS: 4051 {cells}/uL (ref 1500–7800)
NEUTROS PCT: 64.3 %
Platelets: 131 10*3/uL — ABNORMAL LOW (ref 140–400)
RBC: 3.96 10*6/uL — ABNORMAL LOW (ref 4.20–5.80)
RDW: 13.3 % (ref 11.0–15.0)
Total Lymphocyte: 23.5 %
WBC: 6.3 10*3/uL (ref 3.8–10.8)
WBCMIX: 592 {cells}/uL (ref 200–950)

## 2017-08-21 LAB — HEMOGLOBIN A1C
EAG (MMOL/L): 7.6 (calc)
Hgb A1c MFr Bld: 6.4 % of total Hgb — ABNORMAL HIGH (ref <5.7)
MEAN PLASMA GLUCOSE: 137 (calc)

## 2017-08-22 ENCOUNTER — Encounter: Payer: Self-pay | Admitting: Family Medicine

## 2017-08-22 MED ORDER — ROSUVASTATIN CALCIUM 20 MG PO TABS: 20.0000 mg | ORAL_TABLET | Freq: Every day | ORAL | 3 refills | Status: DC

## 2017-08-23 MED ORDER — ROSUVASTATIN CALCIUM 20 MG PO TABS: 20.0000 mg | ORAL_TABLET | Freq: Every day | ORAL | 3 refills | Status: DC

## 2017-09-14 ENCOUNTER — Ambulatory Visit: Payer: Medicare HMO | Admitting: Family

## 2017-09-14 ENCOUNTER — Encounter (HOSPITAL_COMMUNITY): Payer: Medicare HMO

## 2017-09-27 ENCOUNTER — Ambulatory Visit (INDEPENDENT_AMBULATORY_CARE_PROVIDER_SITE_OTHER): Payer: Medicare HMO | Admitting: Family

## 2017-09-27 ENCOUNTER — Encounter: Payer: Self-pay | Admitting: Family

## 2017-09-27 ENCOUNTER — Ambulatory Visit (HOSPITAL_COMMUNITY)
Admission: RE | Admit: 2017-09-27 | Discharge: 2017-09-27 | Disposition: A | Discharge status: Home / Self Care | Payer: Medicare HMO | Source: Ambulatory Visit | Attending: Family | Admitting: Family

## 2017-09-27 VITALS — BP 161/69 | HR 60 | Temp 97.2°F | Resp 20 | Ht 70.5 in | Wt 230.1 lb

## 2017-09-27 DIAGNOSIS — I714 Abdominal aortic aneurysm, without rupture, unspecified: Secondary | ICD-10-CM

## 2017-09-27 DIAGNOSIS — I10 Essential (primary) hypertension: Secondary | ICD-10-CM | POA: Insufficient documentation

## 2017-09-27 DIAGNOSIS — I6521 Occlusion and stenosis of right carotid artery: Secondary | ICD-10-CM | POA: Diagnosis not present

## 2017-09-27 DIAGNOSIS — R143 Flatulence: Secondary | ICD-10-CM | POA: Insufficient documentation

## 2017-09-27 DIAGNOSIS — E669 Obesity, unspecified: Secondary | ICD-10-CM | POA: Insufficient documentation

## 2017-09-27 NOTE — Progress Notes (Signed)
[facility] [place] HISTORY AND PHYSICAL   CC: screen for renal artery stenosis, on 5 antihypertensives, uncontrolled hypertension    History of Present Illness:   Juan [name] is a 77 y.o. male who had a carotid duplex scan at [facility] for evaluation of anasymptomatic carotidbruit. Right side was 50-69%, left side less than 50% antegrade vertebral flow bilaterally. This ultrasound was done on 12/17/2015. Other medical problems include 3.3 cm AAA 2/16. He has been followed for the abdominal aortic aneurysm by [place] for several years. Dr. Oneida Alar evaluated Juan [name] on 01-19-16.  The patient does not have back or abdominal pain. 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 He walks about a mile daily, denies claudications symptoms. The patient denies history of stroke or TIA symptoms.  He had balloon angioplasty of a coronary artery in 1989 and stopped smoking then; patient denies history of MI. Patient states he is working with his PCP on getting his blood pressure under better control, states it runs 263-785'Y systolic.  He denies dyspnea, denies chest pain.  His right hip hurts after about 30 minutes walking.   He states his PCP is adjusting his blood pressure mediations; he takes 5 mediations that lower blood pressure.  He states his blood is lower in the afternoon and evening than in the morning.   He takes a daily 325 mg ASA, no other antiplatelet agents nor anticoagulants. He takes a statin, pravastatin.   Diabetic: Yes,6.4 on 05-14-17, improved greatly from 9.2 A1C on 07-03-16 (review of records) Tobacco use: former smoker, quit at age 23     Current Outpatient Medications  Medication Sig Dispense Refill  . allopurinol (ZYLOPRIM) 300 MG tablet TAKE 1 TABLET EVERY DAY (NEED MD APPOINTMENT) 90 tablet 3  . aspirin 325 MG EC tablet Take 325 mg by mouth daily.    . Blood Glucose Monitoring Suppl (TRUE METRIX  METER) w/Device KIT Use as Directed 1 kit 1  . diltiazem (CARTIA XT) 240 MG 24 hr capsule Take 1 capsule (240 mg total) by mouth daily. 90 capsule 3  . doxazosin (CARDURA) 4 MG tablet Take 1 tablet (4 mg total) by mouth daily. 90 tablet 3  . glipiZIDE (GLUCOTROL) 5 MG tablet TAKE 1 TABLET TWO TIMES DAILY BEFORE A MEAL 180 tablet 3  . glucose blood (TRUE METRIX BLOOD GLUCOSE TEST) test strip Check BS QD - dx: e11.9 100 each 12  . hydrochlorothiazide (HYDRODIURIL) 12.5 MG tablet Take 1 tablet (12.5 mg total) by mouth daily. 90 tablet 3  . Lancet Devices (PRODIGY LANCING DEVICE) MISC Use bid DX e11.9 1 each 2  . lisinopril (PRINIVIL,ZESTRIL) 40 MG tablet TAKE 1 TABLET EVERY DAY (NEED MD APPOINTMENT) 90 tablet 3  . metFORMIN (GLUCOPHAGE) 500 MG tablet TAKE 1 TABLET TWICE DAILY WITH MEALS (NEED MD APPOINTMENT) 180 tablet 3  . metoprolol succinate (TOPROL-XL) 25 MG 24 hr tablet Take 2 tablets (50 mg total) by mouth daily. 180 tablet 3  . Multiple Vitamin (MULTIVITAMIN WITH MINERALS) TABS Take 1 tablet by mouth daily.    . Omega-3 Fatty Acids (FISH OIL PO) Take by mouth.    . pravastatin (PRAVACHOL) 40 MG tablet Take 1 tablet (40 mg total) by mouth daily. 90 tablet 3  . rosuvastatin (CRESTOR) 20 MG tablet Take 1 tablet (20 mg total) by mouth daily. 90 tablet 3  . TRUEPLUS LANCETS 33G MISC Check BS QD - dx: e11.9 (Patient not taking: Reported on 09/27/2017)  100 each 3   No current facility-administered medications for this visit.     Past Medical History:  Diagnosis Date  . AAA (abdominal aortic aneurysm) ([place]) 8/12   3.5cm  . CAD (coronary artery disease)   . Colon polyps   . Diabetes mellitus without complication ([place])   . 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 Hyperlipidemia   . Hypertension   . 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 Neuromuscular disorder ([place])    L2-3,L5-S1 bulging disc /nerve impingement  . PAD (peripheral  artery disease) (HCC)     Social History Social History   Tobacco Use  . Smoking status: Former Smoker    Years: 35.00    Types: Cigarettes    Last attempt to quit: 01/02/1988    Years since quitting: 29.7  . Smokeless tobacco: Never Used  Substance Use Topics  . Alcohol use: Yes    Comment: rare beer, no history of ETOH abuse  . Drug use: No    Family History Family History  Problem Relation Age of Onset  . Diabetes Mother   . Heart disease Mother        After age 63  . Heart attack Mother   . Hypertension Mother   . Diabetes Father   . Heart disease Father        After age 7  . Hypertension Father   . Heart attack Father   . Brain cancer Sister   . Cancer Sister        Brain  . Diabetes Sister   . Hypertension Sister   . Cancer Brother        Chest Tumor  . Diabetes Brother   . Hypertension Brother   . Deep vein thrombosis Sister   . Diabetes Sister   . Diabetes Brother        Bilateral leg  . Colon cancer Neg Hx     Surgical History Past Surgical History:  Procedure Laterality Date  . COLONOSCOPY N/A 09/27/2012   Procedure: COLONOSCOPY;  Surgeon: Daneil Dolin, MD;  [facility];  Service: Endoscopy;  Laterality: N/A;  9:30  . CORONARY ANGIOPLASTY     1989  . TONSILLECTOMY      No Known Allergies  Current Outpatient Medications  Medication Sig Dispense Refill  . allopurinol (ZYLOPRIM) 300 MG tablet TAKE 1 TABLET EVERY DAY (NEED MD APPOINTMENT) 90 tablet 3  . aspirin 325 MG EC tablet Take 325 mg by mouth daily.    . Blood Glucose Monitoring Suppl (TRUE METRIX METER) w/Device KIT Use as Directed 1 kit 1  . diltiazem (CARTIA XT) 240 MG 24 hr capsule Take 1 capsule (240 mg total) by mouth daily. 90 capsule 3  . doxazosin (CARDURA) 4 MG tablet Take 1 tablet (4 mg total) by mouth daily. 90 tablet 3  . glipiZIDE (GLUCOTROL) 5 MG tablet TAKE 1 TABLET TWO TIMES DAILY BEFORE A MEAL 180 tablet 3  . glucose blood (TRUE METRIX BLOOD GLUCOSE TEST)  test strip Check BS QD - dx: e11.9 100 each 12  . hydrochlorothiazide (HYDRODIURIL) 12.5 MG tablet Take 1 tablet (12.5 mg total) by mouth daily. 90 tablet 3  . Lancet Devices (PRODIGY LANCING DEVICE) MISC Use bid DX e11.9 1 each 2  . lisinopril (PRINIVIL,ZESTRIL) 40 MG tablet TAKE 1 TABLET EVERY DAY (NEED MD APPOINTMENT) 90 tablet 3  . metFORMIN (GLUCOPHAGE) 500 MG tablet TAKE  1 TABLET TWICE DAILY WITH MEALS (NEED MD APPOINTMENT) 180 tablet 3  . metoprolol succinate (TOPROL-XL) 25 MG 24 hr tablet Take 2 tablets (50 mg total) by mouth daily. 180 tablet 3  . Multiple Vitamin (MULTIVITAMIN WITH MINERALS) TABS Take 1 tablet by mouth daily.    . Omega-3 Fatty Acids (FISH OIL PO) Take by mouth.    . pravastatin (PRAVACHOL) 40 MG tablet Take 1 tablet (40 mg total) by mouth daily. 90 tablet 3  . rosuvastatin (CRESTOR) 20 MG tablet Take 1 tablet (20 mg total) by mouth daily. 90 tablet 3  . TRUEPLUS LANCETS 33G MISC Check BS QD - dx: e11.9 (Patient not taking: Reported on 09/27/2017) 100 each 3   No current facility-administered medications for this visit.      REVIEW OF SYSTEMS: See HPI for pertinent positives and negatives.  Physical Examination Vitals:   09/27/17 0900 09/27/17 0904  BP: (!) 179/76 (!) 161/69  Pulse: 60   Resp: 20   Temp: (!) 97.2 F (36.2 C)   TempSrc: Oral   SpO2: 98%   Weight: 230 lb 1.6 oz (104.4 kg)   Height: 5' 10.5" (1.791 m)    Body mass index is 32.55 kg/m.  General:  A&O x 3, WD, obese male. HEENT: Grossly intact and WNL.  Pulmonary: Sym exp, respirations are non labored, good air movt, few rales in bases, rhonchi, or wheezing. Cardiac: Regular rhythm and rate, no detected murmur.  Carotid Bruits Right Left   Negative Negative   Adominal aortic pulse is not palpable Radial pulses are 2+                          VASCULAR EXAM:                                                                                                                                            LE Pulses Right Left       FEMORAL  2+ palpable  1+ palpable        POPLITEAL  1+ palpable   3+ palpable       POSTERIOR TIBIAL  2+ palpable   2+ palpable        DORSALIS PEDIS      ANTERIOR TIBIAL 1+ palpable  not palpable     Gastrointestinal: soft, NTND, -G/R, - HSM, - masses palpated, - CVAT B. Musculoskeletal: M/S 5/5 throughout, Extremities without ischemic changes. Skin: No rashes, no ulcers, no cellulitis.   Neurologic: CN 2-12 intact except is hard of hearing, Pain and light touch intact in extremities are intact, Motor exam as listed above. Psychiatric: Normal thought content, mood appropriate to clinical situation.    ASSESSMENT:  Amon Costilla is a 77 y.o. male who is being monitored for asymptomatic extracranial carotid artery disease and asymptomatic AAA.   His hypertension remains  uncontrolled taking 5 medications that lower blood pressure  No renal artery stenosis on renal artery duplex today, this is not contributing to his uncontrolled hypertension. Consider referral to endocrinologist to evaluate adrenal function for possible excess metanephrine production; Juan [name] states he will discuss this with his PCP.  Also, incidental finding of bilateral renal cysts, discussed with Dr. Oneida Alar whether to refer Juan [name] to urologist to monitor this or defer to Juan [name] PCP; will defer to Juan [name] PCP.    DATA  Renal Artery Duplex (09-27-17): No evidence of bilateral renal artery stenosis Renal arteries are proximal to mid aorta Right renal cyst measuring 6.77 x 6.89 cm Left renal cyst with the largest measuring 5.03 x 5.06 cm   AAA Duplex (08/02/2017):  Previous size:(Date: 07-27-16) Saccular aneurysm at the distal aorta with maximal diameter of 4.0 cm. Elevated velocities in the in the common iliac arteries suggest >50% stenosis bilaterally.   Current size:  4.25 cm (Date: 08-02-17); Right CIA: 1.8 cm (324 cm/s PSV); Left CIA: 1.1 cm (209 cm/s PSV).  Bilateral  CIA with >50% stenosis  Bilateral Popliteal Artery Duplex (08/02/17): No evidence of popliteal artery aneurysm bilaterally.   Carotid Duplex (07-27-16): Right ICA: <40% Left ICA: no stenosis. Bilateral vertebral artery flow is antegrade.  Bilateral subclavian artery waveforms are normal. No previous exam at this facility for comparison.    PLAN:   Follow up in 1 year  with the following studies: AAA duplex, and carotid duplex.   Consideration for repair of AAA would be made when the size is 5.0 cm, growth > 1 cm/yr, and symptomatic status.  I discussed in depth with the patient the nature of atherosclerosis, and emphasized the importance of maximal medical management including strict control of blood pressure, blood glucose, and lipid levels, obtaining regular exercise, and cessation of smoking.  The patient is aware that without maximal medical management the underlying atherosclerotic disease process will progress, limiting the benefit of any interventions.  Consideration for repair of AAA would be made when the size approaches 5.0 cm, growth > 1 cm/yr, and symptomatic status. The patient was given information about AAA including signs, symptoms, treatment,  what symptoms should prompt the patient to seek immediate medical care, and how to minimize the risk of enlargement and rupture of aneurysms.   The patient was given information about stroke prevention and what symptoms should prompt the patient to seek immediate medical care.  Thank you for allowing [place] to participate in this patient's care.  Clemon Chambers, RN, MSN, FNP-C [facility] Office: [phone]  Clinic MD: [facility] 09/27/2017 9:06 AM

## 2017-10-23 ENCOUNTER — Other Ambulatory Visit: Payer: Medicare HMO

## 2017-11-02 ENCOUNTER — Encounter: Payer: Self-pay | Admitting: Family Medicine

## 2017-11-02 ENCOUNTER — Ambulatory Visit (INDEPENDENT_AMBULATORY_CARE_PROVIDER_SITE_OTHER): Payer: Medicare HMO | Admitting: Family Medicine

## 2017-11-02 VITALS — BP 120/64 | HR 73 | Temp 98.0°F | Resp 18 | Ht 70.5 in | Wt 234.0 lb

## 2017-11-02 DIAGNOSIS — I6521 Occlusion and stenosis of right carotid artery: Secondary | ICD-10-CM

## 2017-11-02 DIAGNOSIS — I739 Peripheral vascular disease, unspecified: Secondary | ICD-10-CM | POA: Diagnosis not present

## 2017-11-02 DIAGNOSIS — I2510 Atherosclerotic heart disease of native coronary artery without angina pectoris: Secondary | ICD-10-CM

## 2017-11-02 DIAGNOSIS — Z0000 Encounter for general adult medical examination without abnormal findings: Secondary | ICD-10-CM | POA: Diagnosis not present

## 2017-11-02 DIAGNOSIS — E7800 Pure hypercholesterolemia, unspecified: Secondary | ICD-10-CM

## 2017-11-02 DIAGNOSIS — E119 Type 2 diabetes mellitus without complications: Secondary | ICD-10-CM

## 2017-11-02 DIAGNOSIS — I714 Abdominal aortic aneurysm, without rupture, unspecified: Secondary | ICD-10-CM

## 2017-11-02 DIAGNOSIS — I10 Essential (primary) hypertension: Secondary | ICD-10-CM | POA: Diagnosis not present

## 2017-11-02 NOTE — Progress Notes (Signed)
Subjective:    Patient ID: Juan [name], male    DOB: 1940/10/13, 77 y.o.   MRN: [id number]  HPI Patient is here today for complete physical exam.  He has no specific concerns.  Past medical history significant for colonoscopy in 2014.  Based on his age, he no longer needs colon cancer screening.  We had a long discussion today about possible prostate cancer screening.  I have recommended against the PSA in men over the age of 62.  We discussed the rationale behind this and the patient is comfortable with that approach.  His immunizations are up-to-date except for his flu shot.  Unfortunately this is not available today.  I have recommended that he return in September for a flu shot.  His last lab work was done in June.  At that time his hemoglobin A1c was well controlled at 6.4.  His LDL cholesterol was in the 80s.  His renal function has steadily worsened over the last year and was up to 1.44 in June.  Patient states that he is avoiding nephrotoxic medications such as arthritis drugs.  He is drinking plenty of water.  We are due to recheck his lab work in September.  Past medical history significant for right internal carotid artery stenosis.  In 2017, the report states that he has a 50 to 69% stenosis of the right internal carotid artery.  In 2018, the report states that he had a 1 to 39% stenosis.  He has followed up with the vascular surgeons and they have recommended a repeat carotid Doppler next year.  He does have a faint carotid bruit appreciated on exam today this is stable.  He also has a AAA.  It was 4.2 cm in diameter in May.  This will be checked again next May. Past Medical History:  Diagnosis Date  . AAA (abdominal aortic aneurysm) ([place]) 8/12   3.5cm  . CAD (coronary artery disease)   . Colon polyps   . Diabetes mellitus without complication ([place])   . 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 Hyperlipidemia   . Hypertension   . 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 Neuromuscular disorder ([place])    L2-3,L5-S1 bulging disc /nerve impingement  . PAD (peripheral artery disease) ([place])    Past Surgical History:  Procedure Laterality Date  . COLONOSCOPY N/A 09/27/2012   Procedure: COLONOSCOPY;  Surgeon: Daneil Dolin, MD;  [facility];  Service: Endoscopy;  Laterality: N/A;  9:30  . CORONARY ANGIOPLASTY     1989  . TONSILLECTOMY     Current Outpatient Medications on File Prior to Visit  Medication Sig Dispense Refill  . allopurinol (ZYLOPRIM) 300 MG tablet TAKE 1 TABLET EVERY DAY (NEED MD APPOINTMENT) 90 tablet 3  . aspirin 325 MG EC tablet Take 325 mg by mouth daily.    . Blood Glucose Monitoring Suppl (TRUE METRIX METER) w/Device KIT Use as Directed 1 kit 1  . diltiazem (CARTIA XT) 240 MG 24 hr capsule Take 1 capsule (240 mg total) by mouth daily. 90 capsule 3  . doxazosin (CARDURA) 4 MG tablet Take 1 tablet (4 mg total) by mouth daily. 90 tablet 3  . glipiZIDE (GLUCOTROL) 5 MG tablet TAKE 1 TABLET TWO TIMES DAILY BEFORE A MEAL 180 tablet 3  . glucose blood (TRUE METRIX BLOOD GLUCOSE TEST) test strip Check BS QD - dx: e11.9 100 each 12  .  hydrochlorothiazide (HYDRODIURIL) 12.5 MG tablet Take 1 tablet (12.5 mg total) by mouth daily. 90 tablet 3  . Lancet Devices (PRODIGY LANCING DEVICE) MISC Use bid DX e11.9 1 each 2  . lisinopril (PRINIVIL,ZESTRIL) 40 MG tablet TAKE 1 TABLET EVERY DAY (NEED MD APPOINTMENT) 90 tablet 3  . metFORMIN (GLUCOPHAGE) 500 MG tablet TAKE 1 TABLET TWICE DAILY WITH MEALS (NEED MD APPOINTMENT) 180 tablet 3  . metoprolol succinate (TOPROL-XL) 25 MG 24 hr tablet Take 2 tablets (50 mg total) by mouth daily. 180 tablet 3  . Multiple Vitamin (MULTIVITAMIN WITH MINERALS) TABS Take 1 tablet by mouth daily.    . Omega-3 Fatty Acids (FISH OIL PO) Take by mouth.    . pravastatin (PRAVACHOL) 40 MG tablet Take 1 tablet (40 mg total) by mouth daily. 90 tablet 3  . rosuvastatin  (CRESTOR) 20 MG tablet Take 1 tablet (20 mg total) by mouth daily. 90 tablet 3  . TRUEPLUS LANCETS 33G MISC Check BS QD - dx: e11.9 100 each 3   No current facility-administered medications on file prior to visit.    No Known Allergies Social History   Socioeconomic History  . Marital status: Married    Spouse name: Not on file  . Number of children: Not on file  . Years of education: Not on file  . Highest education level: Not on file  Occupational History  . Occupation: retired    Fish farm manager: RETIRED    Comment: [employer]   Social Needs  . Financial resource strain: Not on file  . Food insecurity:    Worry: Not on file    Inability: Not on file  . Transportation needs:    Medical: Not on file    Non-medical: Not on file  Tobacco Use  . Smoking status: Former Smoker    Years: 35.00    Types: Cigarettes    Last attempt to quit: 01/02/1988    Years since quitting: 29.8  . Smokeless tobacco: Never Used  Substance and Sexual Activity  . Alcohol use: Yes    Comment: rare beer, no history of ETOH abuse  . Drug use: No  . Sexual activity: Not on file  Lifestyle  . Physical activity:    Days per week: Not on file    Minutes per session: Not on file  . Stress: Not on file  Relationships  . Social connections:    Talks on phone: Not on file    Gets together: Not on file    Attends religious service: Not on file    Active member of club or organization: Not on file    Attends meetings of clubs or organizations: Not on file    Relationship status: Not on file  . Intimate partner violence:    Fear of current or ex partner: Not on file    Emotionally abused: Not on file    Physically abused: Not on file    Forced sexual activity: Not on file  Other Topics Concern  . Not on file  Social History Narrative  . Not on file   Family History  Problem Relation Age of Onset  . Diabetes Mother   . Heart disease Mother        After age 49  . Heart attack Mother   .  Hypertension Mother   . Diabetes Father   . Heart disease Father        After age 11  . Hypertension Father   . Heart attack Father   .  Brain cancer Sister   . Cancer Sister        Brain  . Diabetes Sister   . Hypertension Sister   . Cancer Brother        Chest Tumor  . Diabetes Brother   . Hypertension Brother   . Deep vein thrombosis Sister   . Diabetes Sister   . Diabetes Brother        Bilateral leg  . Colon cancer Neg Hx       Review of Systems  All other systems reviewed and are negative.      Objective:   Physical Exam  Constitutional: He is oriented to person, place, and time. He appears well-developed and well-nourished. No distress.  HENT:  Head: Normocephalic and atraumatic.  Right Ear: External ear normal.  Left Ear: External ear normal.  Nose: Nose normal.  Mouth/Throat: Oropharynx is clear and moist. No oropharyngeal exudate.  Eyes: Pupils are equal, round, and reactive to light. Conjunctivae are normal. Right eye exhibits no discharge. Left eye exhibits no discharge. No scleral icterus.  Neck: Normal range of motion. Neck supple. No JVD present. No tracheal deviation present. No thyromegaly present.  Cardiovascular: Normal rate, regular rhythm, normal heart sounds and intact distal pulses. Exam reveals no gallop and no friction rub.  No murmur heard. Pulmonary/Chest: Effort normal and breath sounds normal. No stridor. No respiratory distress. He has no wheezes. He has no rales. He exhibits no tenderness.  Abdominal: Soft. Bowel sounds are normal. He exhibits no distension and no mass. There is no tenderness. There is no rebound and no guarding.  Musculoskeletal: Normal range of motion. He exhibits no edema or tenderness.  Lymphadenopathy:    He has no cervical adenopathy.  Neurological: He is alert and oriented to person, place, and time. He has normal reflexes. No cranial nerve deficit. He exhibits normal muscle tone. Coordination normal.  Skin: Skin is  warm. No rash noted. He is not diaphoretic. No erythema. No pallor.  Psychiatric: He has a normal mood and affect. His behavior is normal. Judgment and thought content normal.  Vitals reviewed.         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  His blood pressure is excellent.  I have asked him to return in September to receive a flu shot.  At that time I would like to check a CBC, CMP, lipid panel, hemoglobin A1c, and urine microalbumin.  Goal hemoglobin A1c for this patient is less than 6.5.  Goal LDL cholesterol is less than 70.  We have avoided high-dose statins due to elevated liver function test that occurred approximately 1 year ago.  We may want to reconsider this if his LDL is greater than 70 given his known cardiovascular disease.  He is due to recheck carotid Dopplers in May 2020.  He is due to recheck his abdominal aortic aneurysm in May 2020.  Diabetic foot exam was performed today and shows 1/4 dorsalis pedis and posterior tibialis pulses bilaterally.  He has normal sensation to 10 g monofilament bilaterally.  He does have decreased capillary refill in the toes suggesting decreased perfusion distal to the MTP joints.  However he has normal sensation and there is no evidence of an ischemic ulcer today on exam.  He is declined further prostate cancer screening or colon cancer screening

## 2017-11-22 ENCOUNTER — Other Ambulatory Visit: Payer: Medicare HMO

## 2017-11-22 ENCOUNTER — Ambulatory Visit (INDEPENDENT_AMBULATORY_CARE_PROVIDER_SITE_OTHER): Payer: Medicare HMO | Admitting: Family Medicine

## 2017-11-22 DIAGNOSIS — E119 Type 2 diabetes mellitus without complications: Secondary | ICD-10-CM | POA: Diagnosis not present

## 2017-11-22 DIAGNOSIS — I10 Essential (primary) hypertension: Secondary | ICD-10-CM

## 2017-11-22 DIAGNOSIS — Z79899 Other long term (current) drug therapy: Secondary | ICD-10-CM

## 2017-11-22 DIAGNOSIS — Z23 Encounter for immunization: Secondary | ICD-10-CM | POA: Diagnosis not present

## 2017-11-22 DIAGNOSIS — E785 Hyperlipidemia, unspecified: Secondary | ICD-10-CM | POA: Diagnosis not present

## 2017-11-23 LAB — COMPREHENSIVE METABOLIC PANEL
AG Ratio: 2.1 (calc) (ref 1.0–2.5)
ALBUMIN MSPROF: 4.2 g/dL (ref 3.6–5.1)
ALKALINE PHOSPHATASE (APISO): 55 U/L (ref 40–115)
ALT: 14 U/L (ref 9–46)
AST: 15 U/L (ref 10–35)
BUN/Creatinine Ratio: 13 (calc) (ref 6–22)
BUN: 22 mg/dL (ref 7–25)
CALCIUM: 9.2 mg/dL (ref 8.6–10.3)
CHLORIDE: 104 mmol/L (ref 98–110)
CO2: 24 mmol/L (ref 20–32)
Creat: 1.64 mg/dL — ABNORMAL HIGH (ref 0.70–1.18)
GLUCOSE: 133 mg/dL — AB (ref 65–99)
Globulin: 2 g/dL (calc) (ref 1.9–3.7)
POTASSIUM: 4.4 mmol/L (ref 3.5–5.3)
Sodium: 137 mmol/L (ref 135–146)
Total Bilirubin: 0.5 mg/dL (ref 0.2–1.2)
Total Protein: 6.2 g/dL (ref 6.1–8.1)

## 2017-11-23 LAB — CBC WITH DIFFERENTIAL/PLATELET
Basophils Absolute: 33 cells/uL (ref 0–200)
Basophils Relative: 0.5 %
EOS ABS: 130 {cells}/uL (ref 15–500)
Eosinophils Relative: 2 %
HEMATOCRIT: 35.4 % — AB (ref 38.5–50.0)
HEMOGLOBIN: 12.1 g/dL — AB (ref 13.2–17.1)
LYMPHS ABS: 1560 {cells}/uL (ref 850–3900)
MCH: 31.1 pg (ref 27.0–33.0)
MCHC: 34.2 g/dL (ref 32.0–36.0)
MCV: 91 fL (ref 80.0–100.0)
MPV: 10.1 fL (ref 7.5–12.5)
Monocytes Relative: 8.8 %
NEUTROS ABS: 4206 {cells}/uL (ref 1500–7800)
Neutrophils Relative %: 64.7 %
Platelets: 127 10*3/uL — ABNORMAL LOW (ref 140–400)
RBC: 3.89 10*6/uL — ABNORMAL LOW (ref 4.20–5.80)
RDW: 13.6 % (ref 11.0–15.0)
Total Lymphocyte: 24 %
WBC mixed population: 572 cells/uL (ref 200–950)
WBC: 6.5 10*3/uL (ref 3.8–10.8)

## 2017-11-23 LAB — LIPID PANEL
CHOLESTEROL: 109 mg/dL (ref <200)
HDL: 38 mg/dL — AB (ref >40)
LDL Cholesterol (Calc): 50 mg/dL (calc)
NON-HDL CHOLESTEROL (CALC): 71 mg/dL (ref <130)
Total CHOL/HDL Ratio: 2.9 (calc) (ref <5.0)
Triglycerides: 120 mg/dL (ref <150)

## 2017-11-23 LAB — HEMOGLOBIN A1C
EAG (MMOL/L): 7.7 (calc)
Hgb A1c MFr Bld: 6.5 % of total Hgb — ABNORMAL HIGH (ref <5.7)
MEAN PLASMA GLUCOSE: 140 (calc)

## 2017-11-25 ENCOUNTER — Encounter: Payer: Self-pay | Admitting: Family Medicine

## 2017-12-03 ENCOUNTER — Ambulatory Visit (HOSPITAL_COMMUNITY)
Admission: RE | Admit: 2017-12-03 | Discharge: 2017-12-03 | Disposition: A | Discharge status: Home / Self Care | Payer: Medicare HMO | Source: Ambulatory Visit | Attending: Family Medicine | Admitting: Family Medicine

## 2017-12-03 ENCOUNTER — Encounter: Payer: Self-pay | Admitting: Family Medicine

## 2017-12-03 ENCOUNTER — Ambulatory Visit (INDEPENDENT_AMBULATORY_CARE_PROVIDER_SITE_OTHER): Payer: Medicare HMO | Admitting: Family Medicine

## 2017-12-03 VITALS — BP 110/52 | HR 68 | Temp 97.8°F | Resp 14 | Ht 70.5 in | Wt 229.0 lb

## 2017-12-03 DIAGNOSIS — J984 Other disorders of lung: Secondary | ICD-10-CM | POA: Insufficient documentation

## 2017-12-03 DIAGNOSIS — I2510 Atherosclerotic heart disease of native coronary artery without angina pectoris: Secondary | ICD-10-CM

## 2017-12-03 DIAGNOSIS — R42 Dizziness and giddiness: Secondary | ICD-10-CM | POA: Diagnosis not present

## 2017-12-03 DIAGNOSIS — R0602 Shortness of breath: Secondary | ICD-10-CM

## 2017-12-03 NOTE — Progress Notes (Signed)
Subjective:    Patient ID: Juan [name], male    DOB: March 30, 1940, 77 y.o.   MRN: [id number]  HPI Patient is here today with his wife.  He has a significant past medical history of coronary artery disease status post angioplasty when he was in [place].  He also has a history of peripheral artery disease as well as asymptomatic carotid artery stenosis.  Risk factors for cardiovascular disease include diabetes, hypertension, and hyperlipidemia.  Over the last month, he has noticed increasing dyspnea on exertion.  He is now becoming winded simply walking to his mailbox.  He denies any orthopnea or paroxysmal nocturnal dyspnea however he does become lightheaded with exertion and feels presyncopal at times.  He denies any syncope.  He denies any tachycardia.  He denies any chest pain or angina.  He does occasionally have a tightness that occurs in between his shoulder blades with activity.  He denies any cough, fever, or hemoptysis Past Medical History:  Diagnosis Date  . AAA (abdominal aortic aneurysm) ([place]) 8/12   3.5cm  . CAD (coronary artery disease)   . Colon polyps   . Diabetes mellitus without complication ([place])   . 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 Hyperlipidemia   . Hypertension   . 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 Neuromuscular disorder ([place])    L2-3,L5-S1 bulging disc /nerve impingement  . PAD (peripheral artery disease) ([place])    Past Surgical History:  Procedure Laterality Date  . COLONOSCOPY N/A 09/27/2012   Procedure: COLONOSCOPY;  Surgeon: Daneil Dolin, MD;  Location: [facility];  Service: Endoscopy;  Laterality: N/A;  9:30  . CORONARY ANGIOPLASTY     1989  . TONSILLECTOMY     Current Outpatient Medications on File Prior to Visit  Medication Sig Dispense Refill  . allopurinol (ZYLOPRIM) 300 MG tablet TAKE 1 TABLET EVERY DAY (NEED MD APPOINTMENT) 90 tablet 3  . aspirin 325 MG EC tablet Take 325  mg by mouth daily.    . Blood Glucose Monitoring Suppl (TRUE METRIX METER) w/Device KIT Use as Directed 1 kit 1  . diltiazem (CARTIA XT) 240 MG 24 hr capsule Take 1 capsule (240 mg total) by mouth daily. 90 capsule 3  . doxazosin (CARDURA) 4 MG tablet Take 1 tablet (4 mg total) by mouth daily. 90 tablet 3  . glipiZIDE (GLUCOTROL) 5 MG tablet TAKE 1 TABLET TWO TIMES DAILY BEFORE A MEAL 180 tablet 3  . glucose blood (TRUE METRIX BLOOD GLUCOSE TEST) test strip Check BS QD - dx: e11.9 100 each 12  . hydrochlorothiazide (HYDRODIURIL) 12.5 MG tablet Take 1 tablet (12.5 mg total) by mouth daily. 90 tablet 3  . Lancet Devices (PRODIGY LANCING DEVICE) MISC Use bid DX e11.9 1 each 2  . lisinopril (PRINIVIL,ZESTRIL) 40 MG tablet TAKE 1 TABLET EVERY DAY (NEED MD APPOINTMENT) 90 tablet 3  . metFORMIN (GLUCOPHAGE) 500 MG tablet TAKE 1 TABLET TWICE DAILY WITH MEALS (NEED MD APPOINTMENT) 180 tablet 3  . metoprolol succinate (TOPROL-XL) 25 MG 24 hr tablet Take 2 tablets (50 mg total) by mouth daily. 180 tablet 3  . Multiple Vitamin (MULTIVITAMIN WITH MINERALS) TABS Take 1 tablet by mouth daily.    . Omega-3 Fatty Acids (FISH OIL PO) Take by mouth.    . pravastatin (PRAVACHOL) 40 MG tablet Take 1 tablet (40 mg total) by mouth daily. 90 tablet 3  . rosuvastatin (CRESTOR) 20  MG tablet Take 1 tablet (20 mg total) by mouth daily. 90 tablet 3  . TRUEPLUS LANCETS 33G MISC Check BS QD - dx: e11.9 100 each 3   No current facility-administered medications on file prior to visit.    No Known Allergies Social History   Socioeconomic History  . Marital status: Married    Spouse name: Not on file  . Number of children: Not on file  . Years of education: Not on file  . Highest education level: Not on file  Occupational History  . Occupation: retired    Fish farm manager: RETIRED    Comment: [employer]   Social Needs  . Financial resource strain: Not on file  . Food insecurity:    Worry: Not on file    Inability: Not on  file  . Transportation needs:    Medical: Not on file    Non-medical: Not on file  Tobacco Use  . Smoking status: Former Smoker    Years: 35.00    Types: Cigarettes    Last attempt to quit: 01/02/1988    Years since quitting: 29.9  . Smokeless tobacco: Never Used  Substance and Sexual Activity  . Alcohol use: Yes    Comment: rare beer, no history of ETOH abuse  . Drug use: No  . Sexual activity: Not on file  Lifestyle  . Physical activity:    Days per week: Not on file    Minutes per session: Not on file  . Stress: Not on file  Relationships  . Social connections:    Talks on phone: Not on file    Gets together: Not on file    Attends religious service: Not on file    Active member of club or organization: Not on file    Attends meetings of clubs or organizations: Not on file    Relationship status: Not on file  . Intimate partner violence:    Fear of current or ex partner: Not on file    Emotionally abused: Not on file    Physically abused: Not on file    Forced sexual activity: Not on file  Other Topics Concern  . Not on file  Social History Narrative  . Not on file   Family History  Problem Relation Age of Onset  . Diabetes Mother   . Heart disease Mother        After age 89  . Heart attack Mother   . Hypertension Mother   . Diabetes Father   . Heart disease Father        After age 71  . Hypertension Father   . Heart attack Father   . Brain cancer Sister   . Cancer Sister        Brain  . Diabetes Sister   . Hypertension Sister   . Cancer Brother        Chest Tumor  . Diabetes Brother   . Hypertension Brother   . Deep vein thrombosis Sister   . Diabetes Sister   . Diabetes Brother        Bilateral leg  . Colon cancer Neg Hx       Review of Systems  All other systems reviewed and are negative.      Objective:   Physical Exam  Constitutional: He is oriented to person, place, and time. He appears well-developed and well-nourished. No  distress.  HENT:  Head: Normocephalic and atraumatic.  Right Ear: External ear normal.  Left Ear: External ear normal.  Nose: Nose normal.  Mouth/Throat: Oropharynx is clear and moist. No oropharyngeal exudate.  Eyes: Pupils are equal, round, and reactive to light. Conjunctivae are normal. Right eye exhibits no discharge. Left eye exhibits no discharge. No scleral icterus.  Neck: Normal range of motion. Neck supple. No JVD present. No tracheal deviation present. No thyromegaly present.  Cardiovascular: Normal rate, regular rhythm, normal heart sounds and intact distal pulses. Exam reveals no gallop and no friction rub.  No murmur heard. Pulmonary/Chest: Effort normal and breath sounds normal. No stridor. No respiratory distress. He has no wheezes. He has no rales. He exhibits no tenderness.  Abdominal: Soft. Bowel sounds are normal. He exhibits no distension and no mass. There is no tenderness. There is no rebound and no guarding.  Musculoskeletal: Normal range of motion. He exhibits no edema or tenderness.  Lymphadenopathy:    He has no cervical adenopathy.  Neurological: He is alert and oriented to person, place, and time. He has normal reflexes. No cranial nerve deficit. He exhibits normal muscle tone. Coordination normal.  Skin: Skin is warm. No rash noted. He is not diaphoretic. No erythema. No pallor.  Psychiatric: He has a normal mood and affect. His behavior is normal. Judgment and thought content normal.  Vitals reviewed.         Assessment & Plan:  SOB (shortness of breath) - Plan: DG Chest 2 View, Ambulatory referral to Cardiology  ASCVD (arteriosclerotic cardiovascular disease) - Plan: Ambulatory referral to Cardiology  Patient's recent lab work September 12 does show worsening of his renal function.  He has relative hypotension today.  I suspect dehydration and hypotension may be a component of his fatigue and therefore I recommended temporary discontinuation of  hydrochlorothiazide and recheck blood pressure and renal function in 1 week.  Given his dyspnea on exertion, I will check a chest x-ray.  However given his past medical history of cardiovascular disease including coronary artery disease, peripheral artery disease, and carotid artery stenosis, I have recommended a cardiology consultation for stress test and echocardiogram.  Patient agrees and is requesting that we schedule this for him.  Not had a stress test in quite some time.

## 2017-12-10 ENCOUNTER — Ambulatory Visit (INDEPENDENT_AMBULATORY_CARE_PROVIDER_SITE_OTHER): Payer: Medicare HMO | Admitting: Family Medicine

## 2017-12-10 ENCOUNTER — Encounter: Payer: Self-pay | Admitting: Family Medicine

## 2017-12-10 VITALS — BP 128/58 | HR 64 | Temp 97.5°F | Resp 16 | Ht 70.5 in | Wt 232.0 lb

## 2017-12-10 DIAGNOSIS — I10 Essential (primary) hypertension: Secondary | ICD-10-CM

## 2017-12-10 LAB — BASIC METABOLIC PANEL
BUN/Creatinine Ratio: 12 (calc) (ref 6–22)
BUN: 18 mg/dL (ref 7–25)
CALCIUM: 9 mg/dL (ref 8.6–10.3)
CHLORIDE: 104 mmol/L (ref 98–110)
CO2: 24 mmol/L (ref 20–32)
Creat: 1.51 mg/dL — ABNORMAL HIGH (ref 0.70–1.18)
Glucose, Bld: 154 mg/dL — ABNORMAL HIGH (ref 65–99)
Potassium: 4.4 mmol/L (ref 3.5–5.3)
Sodium: 138 mmol/L (ref 135–146)

## 2017-12-10 NOTE — Progress Notes (Signed)
Subjective:    Patient ID: Juan [name], male    DOB: 1940/05/28, 77 y.o.   MRN: [id number]  HPI  12/03/17 Patient is here today with his wife.  He has a significant past medical history of coronary artery disease status post angioplasty when he was in [place].  He also has a history of peripheral artery disease as well as asymptomatic carotid artery stenosis.  Risk factors for cardiovascular disease include diabetes, hypertension, and hyperlipidemia.  Over the last month, he has noticed increasing dyspnea on exertion.  He is now becoming winded simply walking to his mailbox.  He denies any orthopnea or paroxysmal nocturnal dyspnea however he does become lightheaded with exertion and feels presyncopal at times.  He denies any syncope.  He denies any tachycardia.  He denies any chest pain or angina.  He does occasionally have a tightness that occurs in between his shoulder blades with activity.  He denies any cough, fever, or hemoptysis.  At that time, my plan was: Patient's recent lab work September 12 does show worsening of his renal function.  He has relative hypotension today.  I suspect dehydration and hypotension may be a component of his fatigue and therefore I recommended temporary discontinuation of hydrochlorothiazide and recheck blood pressure and renal function in 1 week.  Given his dyspnea on exertion, I will check a chest x-ray.  However given his past medical history of cardiovascular disease including coronary artery disease, peripheral artery disease, and carotid artery stenosis, I have recommended a cardiology consultation for stress test and echocardiogram.  Patient agrees and is requesting that we schedule this for him.  Not had a stress test in quite some time.  12/10/17 Patient discontinued hydrochlorothiazide.  Since stopping the medication, his blood pressure has remained well controlled between 117 and 132/50-60.  Patient states that he feels better.  He has less fatigue.  On  his lab work last time, his renal function was elevated with a creatinine greater than 1.6.  His baseline is between 1.2 and 1.3.  Therefore I thought the patient had prerenal azotemia secondary to overmedication with hydrochlorothiazide and relative hypotension.  I asked the patient to return today so that I can recheck his renal function off medication.  His blood pressure has certainly improved.  On his chest x-ray, it did show some mild pleural-parenchymal scarring.  This is not been previously imaged in [place] therefore we have no old films to compare to.  Patient has an appointment to see the cardiologist in November. Past Medical History:  Diagnosis Date  . AAA (abdominal aortic aneurysm) ([place]) 8/12   3.5cm  . CAD (coronary artery disease)   . Colon polyps   . Diabetes mellitus without complication ([place])   . 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 Hyperlipidemia   . Hypertension   . 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 Neuromuscular disorder ([place])    L2-3,L5-S1 bulging disc /nerve impingement  . PAD (peripheral artery disease) ([place])    Past Surgical History:  Procedure Laterality Date  . COLONOSCOPY N/A 09/27/2012   Procedure: COLONOSCOPY;  Surgeon: Daneil Dolin, MD;  Location[facility];  Service: Endoscopy;  Laterality: N/A;  9:30  . CORONARY ANGIOPLASTY     1989  . TONSILLECTOMY     Current Outpatient Medications on File Prior to Visit  Medication Sig Dispense Refill  . allopurinol (ZYLOPRIM) 300 MG tablet TAKE 1  TABLET EVERY DAY (NEED MD APPOINTMENT) 90 tablet 3  . aspirin 325 MG EC tablet Take 325 mg by mouth daily.    . Blood Glucose Monitoring Suppl (TRUE METRIX METER) w/Device KIT Use as Directed 1 kit 1  . diltiazem (CARTIA XT) 240 MG 24 hr capsule Take 1 capsule (240 mg total) by mouth daily. 90 capsule 3  . doxazosin (CARDURA) 4 MG tablet Take 1 tablet (4 mg total) by mouth daily. 90 tablet 3    . glipiZIDE (GLUCOTROL) 5 MG tablet TAKE 1 TABLET TWO TIMES DAILY BEFORE A MEAL 180 tablet 3  . glucose blood (TRUE METRIX BLOOD GLUCOSE TEST) test strip Check BS QD - dx: e11.9 100 each 12  . hydrochlorothiazide (HYDRODIURIL) 12.5 MG tablet Take 1 tablet (12.5 mg total) by mouth daily. 90 tablet 3  . Lancet Devices (PRODIGY LANCING DEVICE) MISC Use bid DX e11.9 1 each 2  . lisinopril (PRINIVIL,ZESTRIL) 40 MG tablet TAKE 1 TABLET EVERY DAY (NEED MD APPOINTMENT) 90 tablet 3  . metFORMIN (GLUCOPHAGE) 500 MG tablet TAKE 1 TABLET TWICE DAILY WITH MEALS (NEED MD APPOINTMENT) 180 tablet 3  . metoprolol succinate (TOPROL-XL) 25 MG 24 hr tablet Take 2 tablets (50 mg total) by mouth daily. 180 tablet 3  . Multiple Vitamin (MULTIVITAMIN WITH MINERALS) TABS Take 1 tablet by mouth daily.    . Omega-3 Fatty Acids (FISH OIL PO) Take by mouth.    . pravastatin (PRAVACHOL) 40 MG tablet Take 1 tablet (40 mg total) by mouth daily. 90 tablet 3  . rosuvastatin (CRESTOR) 20 MG tablet Take 1 tablet (20 mg total) by mouth daily. 90 tablet 3  . TRUEPLUS LANCETS 33G MISC Check BS QD - dx: e11.9 100 each 3   No current facility-administered medications on file prior to visit.    No Known Allergies Social History   Socioeconomic History  . Marital status: Married    Spouse name: Not on file  . Number of children: Not on file  . Years of education: Not on file  . Highest education level: Not on file  Occupational History  . Occupation: retired    Fish farm manager: RETIRED    Comment: [employer]   Social Needs  . Financial resource strain: Not on file  . Food insecurity:    Worry: Not on file    Inability: Not on file  . Transportation needs:    Medical: Not on file    Non-medical: Not on file  Tobacco Use  . Smoking status: Former Smoker    Years: 35.00    Types: Cigarettes    Last attempt to quit: 01/02/1988    Years since quitting: 29.9  . Smokeless tobacco: Never Used  Substance and Sexual Activity  .  Alcohol use: Yes    Comment: rare beer, no history of ETOH abuse  . Drug use: No  . Sexual activity: Not on file  Lifestyle  . Physical activity:    Days per week: Not on file    Minutes per session: Not on file  . Stress: Not on file  Relationships  . Social connections:    Talks on phone: Not on file    Gets together: Not on file    Attends religious service: Not on file    Active member of club or organization: Not on file    Attends meetings of clubs or organizations: Not on file    Relationship status: Not on file  . Intimate partner violence:  Fear of current or ex partner: Not on file    Emotionally abused: Not on file    Physically abused: Not on file    Forced sexual activity: Not on file  Other Topics Concern  . Not on file  Social History Narrative  . Not on file   Family History  Problem Relation Age of Onset  . Diabetes Mother   . Heart disease Mother        After age 3  . Heart attack Mother   . Hypertension Mother   . Diabetes Father   . Heart disease Father        After age 6  . Hypertension Father   . Heart attack Father   . Brain cancer Sister   . Cancer Sister        Brain  . Diabetes Sister   . Hypertension Sister   . Cancer Brother        Chest Tumor  . Diabetes Brother   . Hypertension Brother   . Deep vein thrombosis Sister   . Diabetes Sister   . Diabetes Brother        Bilateral leg  . Colon cancer Neg Hx       Review of Systems  All other systems reviewed and are negative.      Objective:   Physical Exam  Constitutional: He is oriented to person, place, and time. He appears well-developed and well-nourished. No distress.  HENT:  Head: Normocephalic and atraumatic.  Right Ear: External ear normal.  Left Ear: External ear normal.  Nose: Nose normal.  Mouth/Throat: Oropharynx is clear and moist. No oropharyngeal exudate.  Eyes: Pupils are equal, round, and reactive to light. Conjunctivae are normal. Right eye exhibits  no discharge. Left eye exhibits no discharge. No scleral icterus.  Neck: Normal range of motion. Neck supple. No JVD present. No tracheal deviation present. No thyromegaly present.  Cardiovascular: Normal rate, regular rhythm, normal heart sounds and intact distal pulses. Exam reveals no gallop and no friction rub.  No murmur heard. Pulmonary/Chest: Effort normal and breath sounds normal. No stridor. No respiratory distress. He has no wheezes. He has no rales. He exhibits no tenderness.  Abdominal: Soft. Bowel sounds are normal. He exhibits no distension and no mass. There is no tenderness. There is no rebound and no guarding.  Musculoskeletal: Normal range of motion. He exhibits no edema or tenderness.  Lymphadenopathy:    He has no cervical adenopathy.  Neurological: He is alert and oriented to person, place, and time. He has normal reflexes. No cranial nerve deficit. He exhibits normal muscle tone. Coordination normal.  Skin: Skin is warm. No rash noted. He is not diaphoretic. No erythema. No pallor.  Psychiatric: He has a normal mood and affect. His behavior is normal. Judgment and thought content normal.  Vitals reviewed.         Assessment & Plan:  Benign essential HTN - Plan: Basic Metabolic Panel  Patient's blood pressure is better and symptomatically he feels better.  I will recheck a BMP today.  If his creatinine has improved and is back to his baseline of 1.3, no further work-up is needed for his kidney function.  He has an appointment to see the cardiologist.  If the cardiac work-up proves negative, we may want to consider a high-resolution CAT scan to further evaluate the scar tissue in his lungs to rule out interstitial lung disease, etc.  We will revisit this after he sees the cardiologist.  I am more concerned about a cardiovascular issue causing his shortness of breath based on his past medical history.

## 2017-12-11 ENCOUNTER — Other Ambulatory Visit: Payer: Self-pay | Admitting: *Deleted

## 2017-12-11 DIAGNOSIS — E870 Hyperosmolality and hypernatremia: Secondary | ICD-10-CM

## 2017-12-11 DIAGNOSIS — N289 Disorder of kidney and ureter, unspecified: Secondary | ICD-10-CM

## 2018-01-14 ENCOUNTER — Encounter: Payer: Self-pay | Admitting: *Deleted

## 2018-01-14 ENCOUNTER — Encounter: Payer: Self-pay | Admitting: Cardiovascular Disease

## 2018-01-14 ENCOUNTER — Ambulatory Visit: Payer: Medicare HMO | Admitting: Cardiovascular Disease

## 2018-01-14 ENCOUNTER — Encounter

## 2018-01-14 VITALS — BP 134/66 | HR 69 | Ht 70.0 in | Wt 234.6 lb

## 2018-01-14 DIAGNOSIS — R0609 Other forms of dyspnea: Secondary | ICD-10-CM

## 2018-01-14 DIAGNOSIS — I25118 Atherosclerotic heart disease of native coronary artery with other forms of angina pectoris: Secondary | ICD-10-CM

## 2018-01-14 DIAGNOSIS — R0602 Shortness of breath: Secondary | ICD-10-CM | POA: Diagnosis not present

## 2018-01-14 DIAGNOSIS — I10 Essential (primary) hypertension: Secondary | ICD-10-CM

## 2018-01-14 DIAGNOSIS — E785 Hyperlipidemia, unspecified: Secondary | ICD-10-CM

## 2018-01-14 DIAGNOSIS — I714 Abdominal aortic aneurysm, without rupture, unspecified: Secondary | ICD-10-CM

## 2018-01-14 DIAGNOSIS — R011 Cardiac murmur, unspecified: Secondary | ICD-10-CM

## 2018-01-14 DIAGNOSIS — I6521 Occlusion and stenosis of right carotid artery: Secondary | ICD-10-CM | POA: Diagnosis not present

## 2018-01-14 NOTE — Progress Notes (Signed)
CARDIOLOGY CONSULT NOTE  Patient ID: Zabian Swayne MRN: [id number] DOB/AGE: 06/02/1940 77 y.o.  Admit date: (Not on file) Primary Physician: Susy Frizzle, MD Referring Physician: Susy Frizzle, MD  Reason for Consultation: Coronary artery disease  HPI: Juan [name] is a 77 y.o. male who is being seen today for the evaluation of coronary artery disease at the request of Susy Frizzle, MD.   EMR indicates he underwent coronary angioplasty in 1989.  This was performed in [place].  He also has a history of abdominal aortic aneurysm and moderate right internal carotid artery stenosis dating back to November 2017.  Most recent abdominal aortic ultrasound performed on 08/02/2017 demonstrates abdominal aorta is 4.2 cm in largest aortic diameter.  It was previously 4 cm in May 2018.  RICA stenosis was 1 to 39% on 07/27/2016 Dopplers.  ECG performed in the office today which I ordered and personally interpreted demonstrates normal sinus rhythm with first-degree AV block, PR interval 242 ms.  He lived in [place] until 2010.  He thinks his last stress test was in the early 2000's.  At the time of PCI, his primary symptom was chest pain.  He has not been experiencing any chest pain.  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  He did have an episode of vertigo which lasted for a few weeks approximately 2 months ago.  This has since resolved.  He denies palpitations, leg swelling, orthopnea, and paroxysmal nocturnal dyspnea.  He tries to walk for 30 minutes at least 3 days/[facility] and walks for about a mile.     No Known Allergies  Current Outpatient Medications  Medication Sig Dispense Refill  . allopurinol (ZYLOPRIM) 300 MG tablet TAKE 1 TABLET EVERY DAY (NEED MD APPOINTMENT) 90 tablet 3  . aspirin 325 MG EC tablet Take 325 mg by mouth daily.    . Blood Glucose Monitoring Suppl (TRUE METRIX  METER) w/Device KIT Use as Directed 1 kit 1  . diltiazem (CARTIA XT) 240 MG 24 hr capsule Take 1 capsule (240 mg total) by mouth daily. 90 capsule 3  . doxazosin (CARDURA) 4 MG tablet Take 1 tablet (4 mg total) by mouth daily. 90 tablet 3  . glipiZIDE (GLUCOTROL) 5 MG tablet TAKE 1 TABLET TWO TIMES DAILY BEFORE A MEAL 180 tablet 3  . glucose blood (TRUE METRIX BLOOD GLUCOSE TEST) test strip Check BS QD - dx: e11.9 100 each 12  . Lancet Devices (PRODIGY LANCING DEVICE) MISC Use bid DX e11.9 1 each 2  . lisinopril (PRINIVIL,ZESTRIL) 40 MG tablet TAKE 1 TABLET EVERY DAY (NEED MD APPOINTMENT) 90 tablet 3  . metFORMIN (GLUCOPHAGE) 500 MG tablet TAKE 1 TABLET TWICE DAILY WITH MEALS (NEED MD APPOINTMENT) 180 tablet 3  . metoprolol succinate (TOPROL-XL) 25 MG 24 hr tablet Take 2 tablets (50 mg total) by mouth daily. 180 tablet 3  . Multiple Vitamin (MULTIVITAMIN WITH MINERALS) TABS Take 1 tablet by mouth daily.    . Omega-3 Fatty Acids (FISH OIL PO) Take by mouth.    . rosuvastatin (CRESTOR) 20 MG tablet Take 1 tablet (20 mg total) by mouth daily. 90 tablet 3  . TRUEPLUS LANCETS 33G MISC Check BS QD - dx: e11.9 100 each 3   No current facility-administered medications for this visit.     Past Medical History:  Diagnosis Date  . AAA (abdominal aortic aneurysm) ([place]) 8/12  3.5cm  . CAD (coronary artery disease)   . Colon polyps   . Diabetes mellitus without complication ([place])   . 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 Hyperlipidemia   . Hypertension   . 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 Neuromuscular disorder ([place])    L2-3,L5-S1 bulging disc /nerve impingement  . PAD (peripheral artery disease) ([place])     Past Surgical History:  Procedure Laterality Date  . COLONOSCOPY N/A 09/27/2012   Procedure: COLONOSCOPY;  Surgeon: Daneil Dolin, MD;  Location: [facility];  Service: Endoscopy;  Laterality: N/A;  9:30  . CORONARY  ANGIOPLASTY     1989  . TONSILLECTOMY      Social History   Socioeconomic History  . Marital status: Married    Spouse name: Not on file  . Number of children: Not on file  . Years of education: Not on file  . Highest education level: Not on file  Occupational History  . Occupation: retired    Fish farm manager: RETIRED    Comment: [employer]   Social Needs  . Financial resource strain: Not on file  . Food insecurity:    Worry: Not on file    Inability: Not on file  . Transportation needs:    Medical: Not on file    Non-medical: Not on file  Tobacco Use  . Smoking status: Former Smoker    Years: 35.00    Types: Cigarettes    Last attempt to quit: 01/02/1988    Years since quitting: 30.0  . Smokeless tobacco: Never Used  Substance and Sexual Activity  . Alcohol use: Not Currently    Comment: rare beer, no history of ETOH abuse  . Drug use: No  . Sexual activity: Not on file  Lifestyle  . Physical activity:    Days per week: Not on file    Minutes per session: Not on file  . Stress: Not on file  Relationships  . Social connections:    Talks on phone: Not on file    Gets together: Not on file    Attends religious service: Not on file    Active member of club or organization: Not on file    Attends meetings of clubs or organizations: Not on file    Relationship status: Not on file  . Intimate partner violence:    Fear of current or ex partner: Not on file    Emotionally abused: Not on file    Physically abused: Not on file    Forced sexual activity: Not on file  Other Topics Concern  . Not on file  Social History Narrative  . Not on file     No family history of premature CAD in 1st degree relatives.  Current Meds  Medication Sig  . allopurinol (ZYLOPRIM) 300 MG tablet TAKE 1 TABLET EVERY DAY (NEED MD APPOINTMENT)  . aspirin 325 MG EC tablet Take 325 mg by mouth daily.  . Blood Glucose Monitoring Suppl (TRUE METRIX METER) w/Device KIT Use as Directed  .  diltiazem (CARTIA XT) 240 MG 24 hr capsule Take 1 capsule (240 mg total) by mouth daily.  Marland Kitchen doxazosin (CARDURA) 4 MG tablet Take 1 tablet (4 mg total) by mouth daily.  Marland Kitchen glipiZIDE (GLUCOTROL) 5 MG tablet TAKE 1 TABLET TWO TIMES DAILY BEFORE A MEAL  . glucose blood (TRUE METRIX BLOOD GLUCOSE TEST) test strip Check BS QD - dx: e11.9  .  Lancet Devices (PRODIGY LANCING DEVICE) MISC Use bid DX e11.9  . lisinopril (PRINIVIL,ZESTRIL) 40 MG tablet TAKE 1 TABLET EVERY DAY (NEED MD APPOINTMENT)  . metFORMIN (GLUCOPHAGE) 500 MG tablet TAKE 1 TABLET TWICE DAILY WITH MEALS (NEED MD APPOINTMENT)  . metoprolol succinate (TOPROL-XL) 25 MG 24 hr tablet Take 2 tablets (50 mg total) by mouth daily.  . Multiple Vitamin (MULTIVITAMIN WITH MINERALS) TABS Take 1 tablet by mouth daily.  . Omega-3 Fatty Acids (FISH OIL PO) Take by mouth.  . rosuvastatin (CRESTOR) 20 MG tablet Take 1 tablet (20 mg total) by mouth daily.  . TRUEPLUS LANCETS 33G MISC Check BS QD - dx: e11.9      Review of systems complete and found to be negative unless listed above in HPI    Physical exam Blood pressure 134/66, pulse 69, height '5\' 10"'  (1.778 m), weight 234 lb 9.6 oz (106.4 kg), SpO2 97 %. General: NAD Neck: No JVD, no thyromegaly or thyroid nodule.  Lungs: Clear to auscultation bilaterally with normal respiratory effort. CV: Nondisplaced PMI. Regular rate and rhythm, normal S1/S2, no H0/T8, soft systolic murmur heard at bilateral upper sternal borders.  No peripheral edema.  No carotid bruit.    Abdomen: Soft, nontender, no distention.  Skin: Intact without lesions or rashes.  Neurologic: Alert and oriented x 3.  Psych: Normal affect. Extremities: No clubbing or cyanosis.  HEENT: Normal.   ECG: Most recent ECG reviewed.   Labs: Lab Results  Component Value Date/Time   K 4.4 12/10/2017 12:16 PM   BUN 18 12/10/2017 12:16 PM   CREATININE 1.51 (H) 12/10/2017 12:16 PM   ALT 14 11/22/2017 08:48 AM   HGB 12.1 (L) 11/22/2017  08:48 AM     Lipids: Lab Results  Component Value Date/Time   LDLCALC 50 11/22/2017 08:48 AM   CHOL 109 11/22/2017 08:48 AM   TRIG 120 11/22/2017 08:48 AM   HDL 38 (L) 11/22/2017 08:48 AM        ASSESSMENT AND PLAN:   1.  Coronary artery disease: He has been experiencing exertional dyspnea for the past 2 months.  He has a history of angioplasty in 1989 in [place].  Currently on aspirin, metoprolol succinate, and rosuvastatin.  I will reduce aspirin to 81 mg daily as he currently takes 325 mg.  I will arrange for an exercise Myoview to evaluate for significant areas of ischemia.  2.  Hypertension: Blood pressure is controlled on long-acting diltiazem, lisinopril, and metoprolol succinate.  No changes to therapy.  3.  Hyperlipidemia: Currently on rosuvastatin 20 mg.  LDL at goal, 50 on 11/22/2017.  4.  Abdominal aortic aneurysm: 4.2 cm in maximal diameter as detailed above.  Followed by vascular surgery.  5.  Right internal carotid artery stenosis: Mild 1 to 39% stenosis as detailed above.  Followed by vascular surgery.  On aspirin and statin therapy.  6.  Cardiac murmur: He likely has some degree of aortic sclerosis if not a mild degree of stenosis. I will order a 2-D echocardiogram with Doppler to evaluate cardiac structure, function, and regional wall motion.     Disposition: Follow up in 10 to 12 weeks  Signed: Kate Sable, M.D., F.A.C.C.  01/14/2018, 1:50 PM

## 2018-01-14 NOTE — Patient Instructions (Signed)
Medication Instructions:  Your physician recommends that you continue on your current medications as directed. Please refer to the Current Medication list given to you today.  If you need a refill on your cardiac medications before your next appointment, please call your pharmacy.   Lab work: NONE  If you have labs (blood work) drawn today and your tests are completely normal, you will receive your results only by: Marland Kitchen MyChart Message (if you have MyChart) OR . A paper copy in the mail If you have any lab test that is abnormal or we need to change your treatment, we will call you to review the results.  Testing/Procedures: Your physician has requested that you have en exercise stress myoview. For further information please visit [email or web address]. Please follow instruction sheet, as given.    Follow-Up: At [facility], you and your health needs are our priority.  As part of our continuing mission to provide you with exceptional heart care, we have created designated Provider Care Teams.  These Care Teams include your primary Cardiologist (physician) and Advanced Practice Providers (APPs -  Physician Assistants and Nurse Practitioners) who all work together to provide you with the care you need, when you need it. You will need a follow up appointment in 10-12  weeks.  Please call our office 2 months in advance to schedule this appointment.  You may see No primary care provider on file. or one of the following Advanced Practice Providers on your designated Care Team:   [place], PA-C [facility]) . Ermalinda Barrios, PA-C ([place])  Any Other Special Instructions Will Be Listed Below (If Applicable). Thank you for choosing [facility]!

## 2018-01-18 ENCOUNTER — Telehealth: Payer: Self-pay | Admitting: Cardiovascular Disease

## 2018-01-18 NOTE — Telephone Encounter (Signed)
Per pt phone call-- he's having problems w/ his feet and legs and he's not going to be able to walk for his stress test. Requesting to do the full nuclear test.

## 2018-01-18 NOTE — Telephone Encounter (Signed)
I told patient to let staff know at time of test, he cannot walk and they can do a lexiscan     I will FYI B.Strader PA-C

## 2018-01-22 ENCOUNTER — Encounter: Payer: Self-pay | Admitting: Family Medicine

## 2018-01-24 ENCOUNTER — Encounter (HOSPITAL_BASED_OUTPATIENT_CLINIC_OR_DEPARTMENT_OTHER)
Admission: RE | Admit: 2018-01-24 | Discharge: 2018-01-24 | Disposition: A | Discharge status: Home / Self Care | Payer: Medicare HMO | Source: Ambulatory Visit | Attending: Cardiovascular Disease | Admitting: Cardiovascular Disease

## 2018-01-24 ENCOUNTER — Ambulatory Visit (HOSPITAL_COMMUNITY)
Admission: RE | Admit: 2018-01-24 | Discharge: 2018-01-24 | Disposition: A | Discharge status: Home / Self Care | Payer: Medicare HMO | Source: Ambulatory Visit | Attending: Cardiovascular Disease | Admitting: Cardiovascular Disease

## 2018-01-24 ENCOUNTER — Encounter (HOSPITAL_COMMUNITY): Payer: Self-pay

## 2018-01-24 ENCOUNTER — Encounter: Payer: Self-pay | Admitting: Family Medicine

## 2018-01-24 ENCOUNTER — Telehealth: Payer: Self-pay

## 2018-01-24 ENCOUNTER — Encounter (HOSPITAL_COMMUNITY)
Admission: RE | Admit: 2018-01-24 | Discharge: 2018-01-24 | Disposition: A | Discharge status: Home / Self Care | Payer: Medicare HMO | Source: Ambulatory Visit | Attending: Cardiovascular Disease | Admitting: Cardiovascular Disease

## 2018-01-24 DIAGNOSIS — R0602 Shortness of breath: Secondary | ICD-10-CM

## 2018-01-24 DIAGNOSIS — I25118 Atherosclerotic heart disease of native coronary artery with other forms of angina pectoris: Secondary | ICD-10-CM | POA: Diagnosis not present

## 2018-01-24 DIAGNOSIS — R0609 Other forms of dyspnea: Secondary | ICD-10-CM | POA: Diagnosis not present

## 2018-01-24 LAB — NM MYOCAR MULTI W/SPECT W/WALL MOTION / EF
CHL CUP NUCLEAR SDS: 0
CHL CUP RESTING HR STRESS: 62 {beats}/min
LHR: 0.35
LVDIAVOL: 78 mL (ref 62–150)
LVSYSVOL: 23 mL
NUC STRESS TID: 0.97
Peak HR: 86 {beats}/min
SRS: 1
SSS: 1

## 2018-01-24 MED ORDER — PERFLUTREN LIPID MICROSPHERE
1.0000 mL | INTRAVENOUS | Status: DC | PRN
  Administered 2018-01-24: 1 mL via INTRAVENOUS
  Administered 2018-01-24: 2 mL via INTRAVENOUS

## 2018-01-24 MED ORDER — SODIUM CHLORIDE 0.9% FLUSH
INTRAVENOUS | Status: AC
  Administered 2018-01-24: 10 mL via INTRAVENOUS
  Filled 2018-01-24: qty 10

## 2018-01-24 MED ORDER — REGADENOSON 0.4 MG/5ML IV SOLN
INTRAVENOUS | Status: AC
  Administered 2018-01-24: 0.4 mg via INTRAVENOUS
  Filled 2018-01-24: qty 5

## 2018-01-24 MED ORDER — TECHNETIUM TC 99M TETROFOSMIN IV KIT
30.0000 | PACK | Freq: Once | INTRAVENOUS | Status: AC | PRN
  Administered 2018-01-24: 31.4 via INTRAVENOUS

## 2018-01-24 MED ORDER — TECHNETIUM TC 99M TETROFOSMIN IV KIT
10.0000 | PACK | Freq: Once | INTRAVENOUS | Status: AC | PRN
  Administered 2018-01-24: 11 via INTRAVENOUS

## 2018-01-24 NOTE — Telephone Encounter (Signed)
Called pt. No answer. Left message for pt to return call.  

## 2018-01-24 NOTE — Progress Notes (Signed)
*  PRELIMINARY RESULTS* Echocardiogram 2D Echocardiogram has been performed with Definity.  Juan [name] 01/24/2018, 9:39 AM

## 2018-01-24 NOTE — Telephone Encounter (Signed)
-----   Message from Herminio Commons, MD sent at 01/24/2018 11:46 AM EST ----- Normal stress test.  Low risk for blockages.

## 2018-01-25 ENCOUNTER — Other Ambulatory Visit: Payer: Medicare HMO

## 2018-01-25 DIAGNOSIS — E870 Hyperosmolality and hypernatremia: Secondary | ICD-10-CM

## 2018-01-25 DIAGNOSIS — N289 Disorder of kidney and ureter, unspecified: Secondary | ICD-10-CM

## 2018-01-26 LAB — BASIC METABOLIC PANEL
BUN/Creatinine Ratio: 13 (calc) (ref 6–22)
BUN: 19 mg/dL (ref 7–25)
CALCIUM: 8.9 mg/dL (ref 8.6–10.3)
CO2: 26 mmol/L (ref 20–32)
Chloride: 104 mmol/L (ref 98–110)
Creat: 1.46 mg/dL — ABNORMAL HIGH (ref 0.70–1.18)
GLUCOSE: 127 mg/dL — AB (ref 65–99)
Potassium: 4.2 mmol/L (ref 3.5–5.3)
Sodium: 138 mmol/L (ref 135–146)

## 2018-01-28 ENCOUNTER — Encounter: Payer: Self-pay | Admitting: Family Medicine

## 2018-01-28 DIAGNOSIS — N183 Chronic kidney disease, stage 3 unspecified: Secondary | ICD-10-CM | POA: Insufficient documentation

## 2018-01-28 MED ORDER — SITAGLIPTIN PHOSPHATE 100 MG PO TABS: 100.0000 mg | ORAL_TABLET | Freq: Every day | ORAL | 1 refills | Status: DC

## 2018-01-28 NOTE — Telephone Encounter (Signed)
Medication sent to requested pharmacy.

## 2018-02-08 MED FILL — Perflutren Lipid Microsphere IV Susp 1.1 MG/ML: INTRAVENOUS | Qty: 10 | Status: AC

## 2018-02-11 ENCOUNTER — Other Ambulatory Visit: Payer: Self-pay | Admitting: Family Medicine

## 2018-03-16 ENCOUNTER — Other Ambulatory Visit: Payer: Self-pay | Admitting: Family Medicine

## 2018-04-15 ENCOUNTER — Encounter: Payer: Self-pay | Admitting: Cardiovascular Disease

## 2018-04-15 ENCOUNTER — Ambulatory Visit: Payer: Medicare HMO | Admitting: Cardiovascular Disease

## 2018-04-15 VITALS — BP 148/72 | HR 86 | Ht 70.0 in | Wt 239.0 lb

## 2018-04-15 DIAGNOSIS — I6521 Occlusion and stenosis of right carotid artery: Secondary | ICD-10-CM

## 2018-04-15 DIAGNOSIS — Z9861 Coronary angioplasty status: Secondary | ICD-10-CM

## 2018-04-15 DIAGNOSIS — I358 Other nonrheumatic aortic valve disorders: Secondary | ICD-10-CM

## 2018-04-15 DIAGNOSIS — I25118 Atherosclerotic heart disease of native coronary artery with other forms of angina pectoris: Secondary | ICD-10-CM

## 2018-04-15 DIAGNOSIS — E785 Hyperlipidemia, unspecified: Secondary | ICD-10-CM

## 2018-04-15 DIAGNOSIS — R0609 Other forms of dyspnea: Secondary | ICD-10-CM | POA: Diagnosis not present

## 2018-04-15 DIAGNOSIS — I714 Abdominal aortic aneurysm, without rupture, unspecified: Secondary | ICD-10-CM

## 2018-04-15 DIAGNOSIS — I10 Essential (primary) hypertension: Secondary | ICD-10-CM | POA: Diagnosis not present

## 2018-04-15 MED ORDER — ISOSORBIDE MONONITRATE ER 30 MG PO TB24: 30.0000 mg | ORAL_TABLET | Freq: Every day | ORAL | 3 refills | Status: DC

## 2018-04-15 NOTE — Patient Instructions (Signed)
Medication Instructions:  START Imdur 30 mg daily If you need a refill on your cardiac medications before your next appointment, please call your pharmacy.   Lab work: None today If you have labs (blood work) drawn today and your tests are completely normal, you will receive your results only by: Marland Kitchen MyChart Message (if you have MyChart) OR . A paper copy in the mail If you have any lab test that is abnormal or we need to change your treatment, we will call you to review the results.  Testing/Procedures: None today  Follow-Up: At [facility], you and your health needs are our priority.  As part of our continuing mission to provide you with exceptional heart care, we have created designated Provider Care Teams.  These Care Teams include your primary Cardiologist (physician) and Advanced Practice Providers (APPs -  Physician Assistants and Nurse Practitioners) who all work together to provide you with the care you need, when you need it. You will need a follow up appointment in 6 months.  Please call our office 2 months in advance to schedule this appointment.  You may see Kate Sable, MD or one of the following Advanced Practice Providers on your designated Care Team:   Bernerd Pho, PA-C [facility]) . Ermalinda Barrios, PA-C ([place])  Any Other Special Instructions Will Be Listed Below (If Applicable). None

## 2018-04-15 NOTE — Progress Notes (Signed)
SUBJECTIVE: The patient returns for follow-up after undergoing cardiovascular testing performed for the evaluation of exertional dyspnea and cardiac murmur.  Nuclear stress test on 01/24/2018 was normal, EF 71%.  Echocardiogram demonstrated normal left ventricular systolic function, LVEF 60 to 65%, mild concentric LVH, normal regional wall motion, grade 1 diastolic dysfunction, and mild to moderate aortic annular calcification with mildly thickened leaflets but no stenosis.  There was also mild mitral leaflet calcifications but no stenosis.  There was mild left atrial dilatation.  In summary, he reportedly underwent coronary angioplasty in 1989 in [place].  Most recent abdominal aortic ultrasound performed on 08/02/2017 demonstrates abdominal aorta is 4.2 cm in largest aortic diameter.  It was previously 4 cm in May 2018.  RICA stenosis was 1 to 39% on 07/27/2016 Dopplers.  He continues to deny chest pain.  He has had some exertional fatigue over the last few years.  He denies leg swelling, palpitations, orthopnea, and paroxysmal nocturnal dyspnea.  He continues to exercise at the senior center most days of the week.  He told me he did not tolerate Jardiance and is no longer taking this.    Review of Systems: As per "subjective", otherwise negative.  Allergies  Allergen Reactions  . Jardiance [Empagliflozin]     Severe nausea-     Current Outpatient Medications  Medication Sig Dispense Refill  . allopurinol (ZYLOPRIM) 300 MG tablet Take 1 tablet (300 mg total) by mouth daily. 90 tablet 3  . aspirin 325 MG EC tablet Take 325 mg by mouth daily.    . Blood Glucose Monitoring Suppl (TRUE METRIX METER) w/Device KIT Use as Directed 1 kit 1  . diltiazem (CARTIA XT) 240 MG 24 hr capsule Take 1 capsule (240 mg total) by mouth daily. 90 capsule 3  . doxazosin (CARDURA) 4 MG tablet Take 1 tablet (4 mg total) by mouth daily. 90 tablet 3  . glipiZIDE (GLUCOTROL) 5 MG tablet TAKE 1  TABLET TWO TIMES DAILY BEFORE A MEAL 180 tablet 2  . glucose blood (TRUE METRIX BLOOD GLUCOSE TEST) test strip Check BS QD - dx: e11.9 100 each 12  . Lancet Devices (PRODIGY LANCING DEVICE) MISC Use bid DX e11.9 1 each 2  . lisinopril (PRINIVIL,ZESTRIL) 40 MG tablet TAKE 1 TABLET EVERY DAY (NEED MD APPOINTMENT) 90 tablet 3  . metFORMIN (GLUCOPHAGE) 500 MG tablet TAKE 1 TABLET TWICE DAILY WITH MEALS (NEED MD APPOINTMENT) 180 tablet 3  . metoprolol succinate (TOPROL-XL) 25 MG 24 hr tablet Take 2 tablets (50 mg total) by mouth daily. 180 tablet 3  . Multiple Vitamin (MULTIVITAMIN WITH MINERALS) TABS Take 1 tablet by mouth daily.    . Omega-3 Fatty Acids (FISH OIL PO) Take by mouth.    . rosuvastatin (CRESTOR) 20 MG tablet Take 1 tablet (20 mg total) by mouth daily. 90 tablet 3  . sitaGLIPtin (JANUVIA) 100 MG tablet Take 1 tablet (100 mg total) by mouth daily. 90 tablet 1  . TRUEPLUS LANCETS 33G MISC Check BS QD - dx: e11.9 100 each 3   No current facility-administered medications for this visit.     Past Medical History:  Diagnosis Date  . AAA (abdominal aortic aneurysm) ([place]) 8/12   3.5cm  . CAD (coronary artery disease)   . CKD (chronic kidney disease) stage 3, GFR 30-59 ml/min ([facility])   . Colon polyps   . Diabetes mellitus without complication ([place])   . 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 Hyperlipidemia   .  Hypertension   . 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 Neuromuscular disorder ([place])    L2-3,L5-S1 bulging disc /nerve impingement  . PAD (peripheral artery disease) ([place])     Past Surgical History:  Procedure Laterality Date  . COLONOSCOPY N/A 09/27/2012   Procedure: COLONOSCOPY;  Surgeon: Daneil Dolin, MD;  Location[facility];  Service: Endoscopy;  Laterality: N/A;  9:30  . CORONARY ANGIOPLASTY     1989  . TONSILLECTOMY      Social History   Socioeconomic History  . Marital status: Married    Spouse  name: Not on file  . Number of children: Not on file  . Years of education: Not on file  . Highest education level: Not on file  Occupational History  . Occupation: retired    Fish farm manager: RETIRED    Comment: [employer]   Social Needs  . Financial resource strain: Not on file  . Food insecurity:    Worry: Not on file    Inability: Not on file  . Transportation needs:    Medical: Not on file    Non-medical: Not on file  Tobacco Use  . Smoking status: Former Smoker    Years: 35.00    Types: Cigarettes    Last attempt to quit: 01/02/1988    Years since quitting: 30.3  . Smokeless tobacco: Never Used  Substance and Sexual Activity  . Alcohol use: Not Currently    Comment: rare beer, no history of ETOH abuse  . Drug use: No  . Sexual activity: Not on file  Lifestyle  . Physical activity:    Days per week: Not on file    Minutes per session: Not on file  . Stress: Not on file  Relationships  . Social connections:    Talks on phone: Not on file    Gets together: Not on file    Attends religious service: Not on file    Active member of club or organization: Not on file    Attends meetings of clubs or organizations: Not on file    Relationship status: Not on file  . Intimate partner violence:    Fear of current or ex partner: Not on file    Emotionally abused: Not on file    Physically abused: Not on file    Forced sexual activity: Not on file  Other Topics Concern  . Not on file  Social History Narrative  . Not on file     Vitals:   04/15/18 1031  BP: (!) 148/72  Pulse: 86  SpO2: 97%  Weight: 239 lb (108.4 kg)  Height: '5\' 10"'  (1.778 m)    Wt Readings from Last 3 Encounters:  04/15/18 239 lb (108.4 kg)  01/14/18 234 lb 9.6 oz (106.4 kg)  12/10/17 232 lb (105.2 kg)     PHYSICAL EXAM General: NAD HEENT: Normal. Neck: No JVD, no thyromegaly. Lungs: Clear to auscultation bilaterally with normal respiratory effort. CV: Regular rate and rhythm, normal S1/S2, no  M5/Y6, soft systolic murmur heard at bilateral upper sternal borders. No pretibial or periankle edema.   Abdomen: Soft, nontender, no distention.  Neurologic: Alert and oriented.  Psych: Normal affect. Skin: Normal. Musculoskeletal: No gross deformities.    ECG: Reviewed above under Subjective   Labs: Lab Results  Component Value Date/Time   K 4.2 01/25/2018 03:52 PM   BUN 19 01/25/2018 03:52 PM   CREATININE 1.46 (H) 01/25/2018 03:52  PM   ALT 14 11/22/2017 08:48 AM   HGB 12.1 (L) 11/22/2017 08:48 AM     Lipids: Lab Results  Component Value Date/Time   LDLCALC 50 11/22/2017 08:48 AM   CHOL 109 11/22/2017 08:48 AM   TRIG 120 11/22/2017 08:48 AM   HDL 38 (L) 11/22/2017 08:48 AM       ASSESSMENT AND PLAN: 1.  Coronary artery disease: He does have some exertional dyspnea and fatigue.  He has a history of angioplasty in 1989 in [place].  Nuclear stress test in November 2019 was normal as reviewed above.  Continue aspirin, Toprol-XL, and rosuvastatin.  I will add Imdur 30 mg daily.  2.  Hypertension: Blood pressure is mildly elevated today.  This will need further monitoring.  I am adding Imdur 30 mg daily.  Continue long-acting diltiazem, lisinopril, and Toprol-XL.  3.  Hyperlipidemia: Currently on rosuvastatin 20 mg.  LDL at goal, 50 on 11/22/2017.  4.  Abdominal aortic aneurysm: 4.2 cm in maximal diameter as detailed above.  Followed by vascular surgery.  5.  Right internal carotid artery stenosis: Mild 1 to 39% stenosis as detailed above.  Followed by vascular surgery.  On aspirin and statin therapy.  6.  Cardiac murmur/aortic sclerosis: No significant stenosis seen by echocardiogram in November 2019.   Disposition: Follow up 6 months   Kate Sable, M.D., F.A.C.C.

## 2018-05-27 ENCOUNTER — Other Ambulatory Visit: Payer: Self-pay | Admitting: Family Medicine

## 2018-05-27 ENCOUNTER — Other Ambulatory Visit: Payer: Medicare HMO

## 2018-05-27 ENCOUNTER — Other Ambulatory Visit: Payer: Self-pay

## 2018-05-27 DIAGNOSIS — E119 Type 2 diabetes mellitus without complications: Secondary | ICD-10-CM

## 2018-05-27 DIAGNOSIS — I739 Peripheral vascular disease, unspecified: Secondary | ICD-10-CM | POA: Diagnosis not present

## 2018-05-27 DIAGNOSIS — I10 Essential (primary) hypertension: Secondary | ICD-10-CM

## 2018-05-27 DIAGNOSIS — E7800 Pure hypercholesterolemia, unspecified: Secondary | ICD-10-CM | POA: Diagnosis not present

## 2018-05-28 LAB — CBC WITH DIFFERENTIAL/PLATELET
Absolute Monocytes: 657 cells/uL (ref 200–950)
BASOS PCT: 0.5 %
Basophils Absolute: 33 cells/uL (ref 0–200)
EOS ABS: 169 {cells}/uL (ref 15–500)
EOS PCT: 2.6 %
HCT: 36.5 % — ABNORMAL LOW (ref 38.5–50.0)
Hemoglobin: 11.8 g/dL — ABNORMAL LOW (ref 13.2–17.1)
Lymphs Abs: 1391 cells/uL (ref 850–3900)
MCH: 29.2 pg (ref 27.0–33.0)
MCHC: 32.3 g/dL (ref 32.0–36.0)
MCV: 90.3 fL (ref 80.0–100.0)
MONOS PCT: 10.1 %
MPV: 10 fL (ref 7.5–12.5)
NEUTROS ABS: 4251 {cells}/uL (ref 1500–7800)
Neutrophils Relative %: 65.4 %
PLATELETS: 137 10*3/uL — AB (ref 140–400)
RBC: 4.04 10*6/uL — AB (ref 4.20–5.80)
RDW: 14.1 % (ref 11.0–15.0)
TOTAL LYMPHOCYTE: 21.4 %
WBC: 6.5 10*3/uL (ref 3.8–10.8)

## 2018-05-28 LAB — LIPID PANEL
CHOL/HDL RATIO: 3.1 (calc) (ref <5.0)
Cholesterol: 124 mg/dL (ref <200)
HDL: 40 mg/dL (ref >=40)
LDL Cholesterol (Calc): 64 mg/dL (calc)
NON-HDL CHOLESTEROL (CALC): 84 mg/dL (ref <130)
TRIGLYCERIDES: 117 mg/dL (ref <150)

## 2018-05-28 LAB — COMPREHENSIVE METABOLIC PANEL
AG Ratio: 1.7 (calc) (ref 1.0–2.5)
ALT: 12 U/L (ref 9–46)
AST: 17 U/L (ref 10–35)
Albumin: 4 g/dL (ref 3.6–5.1)
Alkaline phosphatase (APISO): 60 U/L (ref 35–144)
BUN / CREAT RATIO: 12 (calc) (ref 6–22)
BUN: 18 mg/dL (ref 7–25)
CHLORIDE: 104 mmol/L (ref 98–110)
CO2: 25 mmol/L (ref 20–32)
CREATININE: 1.55 mg/dL — AB (ref 0.70–1.18)
Calcium: 9 mg/dL (ref 8.6–10.3)
GLOBULIN: 2.3 g/dL (ref 1.9–3.7)
GLUCOSE: 159 mg/dL — AB (ref 65–99)
Potassium: 4.9 mmol/L (ref 3.5–5.3)
SODIUM: 139 mmol/L (ref 135–146)
TOTAL PROTEIN: 6.3 g/dL (ref 6.1–8.1)
Total Bilirubin: 0.4 mg/dL (ref 0.2–1.2)

## 2018-05-28 LAB — HEMOGLOBIN A1C
HEMOGLOBIN A1C: 7 %{Hb} — AB (ref <5.7)
MEAN PLASMA GLUCOSE: 154 (calc)
eAG (mmol/L): 8.5 (calc)

## 2018-05-30 ENCOUNTER — Other Ambulatory Visit: Payer: Self-pay

## 2018-05-30 ENCOUNTER — Ambulatory Visit (INDEPENDENT_AMBULATORY_CARE_PROVIDER_SITE_OTHER): Payer: Medicare HMO | Admitting: Family Medicine

## 2018-05-30 ENCOUNTER — Encounter: Payer: Self-pay | Admitting: Family Medicine

## 2018-05-30 VITALS — BP 120/54 | HR 60 | Temp 97.8°F | Resp 16 | Ht 70.5 in | Wt 240.0 lb

## 2018-05-30 DIAGNOSIS — E7800 Pure hypercholesterolemia, unspecified: Secondary | ICD-10-CM

## 2018-05-30 DIAGNOSIS — N183 Chronic kidney disease, stage 3 unspecified: Secondary | ICD-10-CM

## 2018-05-30 DIAGNOSIS — E119 Type 2 diabetes mellitus without complications: Secondary | ICD-10-CM

## 2018-05-30 DIAGNOSIS — I10 Essential (primary) hypertension: Secondary | ICD-10-CM | POA: Diagnosis not present

## 2018-05-30 NOTE — Progress Notes (Signed)
Subjective:    Patient ID: Juan [name], male    DOB: 1940-07-13, 78 y.o.   MRN: [id number]  HPI   Patient is here today with his wife.  He has a significant past medical history of coronary artery disease status post angioplasty when he was in [place].  He also has a history of peripheral artery disease as well as asymptomatic carotid artery stenosis.  Patient is due to see his vascular specialist in May to recheck his right-sided carotid artery stenosis as well as his AAA.  He denies any chest pain today.  He denies any shortness of breath or dyspnea on exertion.  His most recent lab work is listed below: Appointment on 05/27/2018  Component Date Value Ref Range Status  . WBC 05/27/2018 6.5  3.8 - 10.8 Thousand/uL Final  . RBC 05/27/2018 4.04* 4.20 - 5.80 Million/uL Final  . Hemoglobin 05/27/2018 11.8* 13.2 - 17.1 g/dL Final  . HCT 05/27/2018 36.5* 38.5 - 50.0 % Final  . MCV 05/27/2018 90.3  80.0 - 100.0 fL Final  . MCH 05/27/2018 29.2  27.0 - 33.0 pg Final  . MCHC 05/27/2018 32.3  32.0 - 36.0 g/dL Final  . RDW 05/27/2018 14.1  11.0 - 15.0 % Final  . Platelets 05/27/2018 137* 140 - 400 Thousand/uL Final  . MPV 05/27/2018 10.0  7.5 - 12.5 fL Final  . Neutro Abs 05/27/2018 4,251  1,500 - 7,800 cells/uL Final  . Lymphs Abs 05/27/2018 1,391  850 - 3,900 cells/uL Final  . Absolute Monocytes 05/27/2018 657  200 - 950 cells/uL Final  . Eosinophils Absolute 05/27/2018 169  15 - 500 cells/uL Final  . Basophils Absolute 05/27/2018 33  0 - 200 cells/uL Final  . Neutrophils Relative % 05/27/2018 65.4  % Final  . Total Lymphocyte 05/27/2018 21.4  % Final  . Monocytes Relative 05/27/2018 10.1  % Final  . Eosinophils Relative 05/27/2018 2.6  % Final  . Basophils Relative 05/27/2018 0.5  % Final  . Glucose, Bld 05/27/2018 159* 65 - 99 mg/dL Final   Comment: .            Fasting reference interval . For someone without known diabetes, a glucose value >125 mg/dL indicates that they may have  diabetes and this should be confirmed with a follow-up test. .   . BUN 05/27/2018 18  7 - 25 mg/dL Final  . Creat 05/27/2018 1.55* 0.70 - 1.18 mg/dL Final   Comment: For patients >4 years of age, the reference limit for Creatinine is approximately 13% higher for people identified as African-American. .   Havery Moros Ratio 05/27/2018 12  6 - 22 (calc) Final  . Sodium 05/27/2018 139  135 - 146 mmol/L Final  . Potassium 05/27/2018 4.9  3.5 - 5.3 mmol/L Final  . Chloride 05/27/2018 104  98 - 110 mmol/L Final  . CO2 05/27/2018 25  20 - 32 mmol/L Final  . Calcium 05/27/2018 9.0  8.6 - 10.3 mg/dL Final  . Total Protein 05/27/2018 6.3  6.1 - 8.1 g/dL Final  . Albumin 05/27/2018 4.0  3.6 - 5.1 g/dL Final  . Globulin 05/27/2018 2.3  1.9 - 3.7 g/dL (calc) Final  . AG Ratio 05/27/2018 1.7  1.0 - 2.5 (calc) Final  . Total Bilirubin 05/27/2018 0.4  0.2 - 1.2 mg/dL Final  . Alkaline phosphatase (APISO) 05/27/2018 60  35 - 144 U/L Final  . AST 05/27/2018 17  10 - 35 U/L Final  . ALT 05/27/2018 12  9 - 46 U/L Final  . Cholesterol 05/27/2018 124  <200 mg/dL Final  . HDL 05/27/2018 40  > OR = 40 mg/dL Final  . Triglycerides 05/27/2018 117  <150 mg/dL Final  . LDL Cholesterol (Calc) 05/27/2018 64  mg/dL (calc) Final   Comment: Reference range: <100 . Desirable range <100 mg/dL for primary prevention;   <70 mg/dL for patients with CHD or diabetic patients  with > or = 2 CHD risk factors. Marland Kitchen LDL-C is now calculated using the Martin-Hopkins  calculation, which is a validated novel method providing  better accuracy than the Friedewald equation in the  estimation of LDL-C.  Cresenciano Genre et al. Annamaria Helling. 3016;010(93): 2061-2068  (http://education.[facility].com/faq/FAQ164)   . Total CHOL/HDL Ratio 05/27/2018 3.1  <5.0 (calc) Final  . Non-HDL Cholesterol (Calc) 05/27/2018 84  <130 mg/dL (calc) Final   Comment: For patients with diabetes plus 1 major ASCVD risk  factor, treating to a non-HDL-C  goal of <100 mg/dL  (LDL-C of <70 mg/dL) is considered a therapeutic  option.   . Hgb A1c MFr Bld 05/27/2018 7.0* <5.7 % of total Hgb Final   Comment: For someone without known diabetes, a hemoglobin A1c value of 6.5% or greater indicates that they may have  diabetes and this should be confirmed with a follow-up  test. . For someone with known diabetes, a value <7% indicates  that their diabetes is well controlled and a value  greater than or equal to 7% indicates suboptimal  control. A1c targets should be individualized based on  duration of diabetes, age, comorbid conditions, and  other considerations. . Currently, no consensus exists regarding use of hemoglobin A1c for diagnosis of diabetes for children. .   . Mean Plasma Glucose 05/27/2018 154  (calc) Final  . eAG (mmol/L) 05/27/2018 8.5  (calc) Final   Patient has been relatively inactive over the winter time.  His hemoglobin A1c has climbed from 6.5-7.0.  His chronic kidney disease is stable and his cholesterol is outstanding.  He denies any polyuria, polydipsia, or blurry vision. Past Medical History:  Diagnosis Date  . AAA (abdominal aortic aneurysm) ([place]) 8/12   3.5cm  . CAD (coronary artery disease)   . CKD (chronic kidney disease) stage 3, GFR 30-59 ml/min (HCC)   . Colon polyps   . Diabetes mellitus without complication ([place])   . 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 Hyperlipidemia   . Hypertension   . 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 Neuromuscular disorder ([place])    L2-3,L5-S1 bulging disc /nerve impingement  . PAD (peripheral artery disease) ([place])    Past Surgical History:  Procedure Laterality Date  . COLONOSCOPY N/A 09/27/2012   Procedure: COLONOSCOPY;  Surgeon: Daneil Dolin, MD;  Location: [facility];  Service: Endoscopy;  Laterality: N/A;  9:30  . CORONARY ANGIOPLASTY     1989  . TONSILLECTOMY     Current Outpatient Medications on  File Prior to Visit  Medication Sig Dispense Refill  . allopurinol (ZYLOPRIM) 300 MG tablet Take 1 tablet (300 mg total) by mouth daily. 90 tablet 3  . aspirin EC 81 MG tablet Take 81 mg by mouth daily.    . Blood Glucose Monitoring Suppl (TRUE METRIX METER) w/Device KIT Use as Directed 1 kit 1  . diltiazem (CARTIA XT) 240 MG 24 hr capsule Take 1 capsule (240 mg total) by mouth daily. 90 capsule 3  . doxazosin (  CARDURA) 4 MG tablet Take 1 tablet (4 mg total) by mouth daily. 90 tablet 3  . glipiZIDE (GLUCOTROL) 5 MG tablet TAKE 1 TABLET TWO TIMES DAILY BEFORE A MEAL 180 tablet 2  . glucose blood (TRUE METRIX BLOOD GLUCOSE TEST) test strip Check BS QD - dx: e11.9 100 each 12  . isosorbide mononitrate (IMDUR) 30 MG 24 hr tablet Take 1 tablet (30 mg total) by mouth daily. 90 tablet 3  . Lancet Devices (PRODIGY LANCING DEVICE) MISC Use bid DX e11.9 1 each 2  . lisinopril (PRINIVIL,ZESTRIL) 40 MG tablet TAKE 1 TABLET EVERY DAY (NEED MD APPOINTMENT) 90 tablet 3  . metFORMIN (GLUCOPHAGE) 500 MG tablet TAKE 1 TABLET TWICE DAILY WITH MEALS (NEED MD APPOINTMENT) 180 tablet 3  . metoprolol succinate (TOPROL-XL) 25 MG 24 hr tablet Take 2 tablets (50 mg total) by mouth daily. 180 tablet 3  . Multiple Vitamin (MULTIVITAMIN WITH MINERALS) TABS Take 1 tablet by mouth daily.    . Omega-3 Fatty Acids (FISH OIL PO) Take by mouth.    . rosuvastatin (CRESTOR) 20 MG tablet Take 1 tablet (20 mg total) by mouth daily. 90 tablet 3  . sitaGLIPtin (JANUVIA) 100 MG tablet Take 1 tablet (100 mg total) by mouth daily. 90 tablet 1  . TRUEPLUS LANCETS 33G MISC Check BS QD - dx: e11.9 100 each 3   No current facility-administered medications on file prior to visit.    Allergies  Allergen Reactions  . Jardiance [Empagliflozin]     Severe nausea-    Social History   Socioeconomic History  . Marital status: Married    Spouse name: Not on file  . Number of children: Not on file  . Years of education: Not on file  .  Highest education level: Not on file  Occupational History  . Occupation: retired    Fish farm manager: RETIRED    Comment: [employer]   Social Needs  . Financial resource strain: Not on file  . Food insecurity:    Worry: Not on file    Inability: Not on file  . Transportation needs:    Medical: Not on file    Non-medical: Not on file  Tobacco Use  . Smoking status: Former Smoker    Years: 35.00    Types: Cigarettes    Last attempt to quit: 01/02/1988    Years since quitting: 30.4  . Smokeless tobacco: Never Used  Substance and Sexual Activity  . Alcohol use: Not Currently    Comment: rare beer, no history of ETOH abuse  . Drug use: No  . Sexual activity: Not on file  Lifestyle  . Physical activity:    Days per week: Not on file    Minutes per session: Not on file  . Stress: Not on file  Relationships  . Social connections:    Talks on phone: Not on file    Gets together: Not on file    Attends religious service: Not on file    Active member of club or organization: Not on file    Attends meetings of clubs or organizations: Not on file    Relationship status: Not on file  . Intimate partner violence:    Fear of current or ex partner: Not on file    Emotionally abused: Not on file    Physically abused: Not on file    Forced sexual activity: Not on file  Other Topics Concern  . Not on file  Social History Narrative  . Not  on file   Family History  Problem Relation Age of Onset  . Diabetes Mother   . Heart disease Mother        After age 27  . Heart attack Mother   . Hypertension Mother   . Diabetes Father   . Heart disease Father        After age 52  . Hypertension Father   . Heart attack Father   . Brain cancer Sister   . Cancer Sister        Brain  . Diabetes Sister   . Hypertension Sister   . Cancer Brother        Chest Tumor  . Diabetes Brother   . Hypertension Brother   . Deep vein thrombosis Sister   . Diabetes Sister   . Diabetes Brother         Bilateral leg  . Colon cancer Neg Hx       Review of Systems  All other systems reviewed and are negative.      Objective:   Physical Exam  Constitutional: He is oriented to person, place, and time. He appears well-developed and well-nourished. No distress.  HENT:  Head: Normocephalic and atraumatic.  Right Ear: External ear normal.  Left Ear: External ear normal.  Nose: Nose normal.  Mouth/Throat: Oropharynx is clear and moist. No oropharyngeal exudate.  Eyes: Pupils are equal, round, and reactive to light. Conjunctivae are normal. Right eye exhibits no discharge. Left eye exhibits no discharge. No scleral icterus.  Neck: Normal range of motion. Neck supple. No JVD present. No tracheal deviation present. No thyromegaly present.  Cardiovascular: Normal rate, regular rhythm, normal heart sounds and intact distal pulses. Exam reveals no gallop and no friction rub.  No murmur heard. Pulmonary/Chest: Effort normal and breath sounds normal. No stridor. No respiratory distress. He has no wheezes. He has no rales. He exhibits no tenderness.  Abdominal: Soft. Bowel sounds are normal. He exhibits no distension and no mass. There is no abdominal tenderness. There is no rebound and no guarding.  Musculoskeletal: Normal range of motion.        General: No tenderness or edema.  Lymphadenopathy:    He has no cervical adenopathy.  Neurological: He is alert and oriented to person, place, and time. He has normal reflexes. No cranial nerve deficit. He exhibits normal muscle tone. Coordination normal.  Skin: Skin is warm. No rash noted. He is not diaphoretic. No erythema. No pallor.  Psychiatric: He has a normal mood and affect. His behavior is normal. Judgment and thought content normal.  Vitals reviewed.         Assessment & Plan:  Benign essential HTN  Controlled type 2 diabetes mellitus without complication, without long-term current use of insulin (HCC)  Pure hypercholesterolemia   CKD (chronic kidney disease) stage 3, GFR 30-59 ml/min (HCC)  Blood pressure today is well controlled.  Chronic kidney disease is stable.  Cholesterol is outstanding.  I like to keep his LDL cholesterol below 70.  There is room for improvement with his diabetes.  I like his hemoglobin A1c to be less than 6.5.  However encouraged the patient to make therapeutic lifestyle changes in order to achieve this.  I recommended diet exercise and weight loss as a means to accomplish this.  I would recheck his lab work in 6 months.  I would like the patient to have carotid Dopplers as well as an ultrasound to evaluate his AAA however I will defer this  to his vascular specialist who he sees in May.  He also reports some fatigue.  I believe some of this is due to deconditioning.  If his fatigue does not improve with increasing exercise, we could check a TSH, testosterone level, and consider screening for obstructive sleep apnea

## 2018-06-11 ENCOUNTER — Other Ambulatory Visit: Payer: Self-pay | Admitting: Family Medicine

## 2018-07-16 ENCOUNTER — Encounter: Payer: Self-pay | Admitting: Family Medicine

## 2018-07-16 MED ORDER — SITAGLIPTIN PHOSPHATE 100 MG PO TABS: 100.0000 mg | ORAL_TABLET | Freq: Every day | ORAL | 1 refills | Status: DC

## 2018-07-28 ENCOUNTER — Encounter: Payer: Self-pay | Admitting: Family Medicine

## 2018-07-29 MED ORDER — GLUCOSE BLOOD VI STRP: ORAL_STRIP | 12 refills | Status: DC

## 2018-08-02 DIAGNOSIS — H25013 Cortical age-related cataract, bilateral: Secondary | ICD-10-CM | POA: Diagnosis not present

## 2018-08-02 DIAGNOSIS — H5203 Hypermetropia, bilateral: Secondary | ICD-10-CM | POA: Diagnosis not present

## 2018-08-02 DIAGNOSIS — H2513 Age-related nuclear cataract, bilateral: Secondary | ICD-10-CM | POA: Diagnosis not present

## 2018-08-02 DIAGNOSIS — H52203 Unspecified astigmatism, bilateral: Secondary | ICD-10-CM | POA: Diagnosis not present

## 2018-08-02 DIAGNOSIS — E119 Type 2 diabetes mellitus without complications: Secondary | ICD-10-CM | POA: Diagnosis not present

## 2018-08-02 DIAGNOSIS — Z7984 Long term (current) use of oral hypoglycemic drugs: Secondary | ICD-10-CM | POA: Diagnosis not present

## 2018-08-02 DIAGNOSIS — H524 Presbyopia: Secondary | ICD-10-CM | POA: Diagnosis not present

## 2018-08-02 LAB — HM DIABETES EYE EXAM

## 2018-08-27 IMAGING — US US CAROTID DUPLEX BILAT
1 series · 13 of 24 positions shown · non-contrast
Comparison: None.

CLINICAL DATA: 75-year-old male with bilateral carotid bruit

EXAM:
BILATERAL CAROTID DUPLEX ULTRASOUND
TECHNIQUE: Gray scale imaging, color Doppler and duplex ultrasound were
performed of bilateral carotid and vertebral arteries in the neck.

[Series 1: us carotid duplex bilat · 0.06mm/px · 13 of 104 slices shown]
[im 1/104]
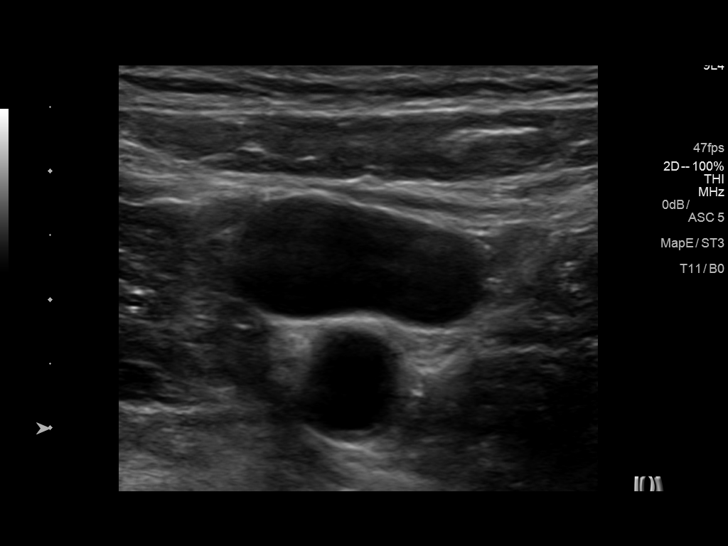
[im 9/104]
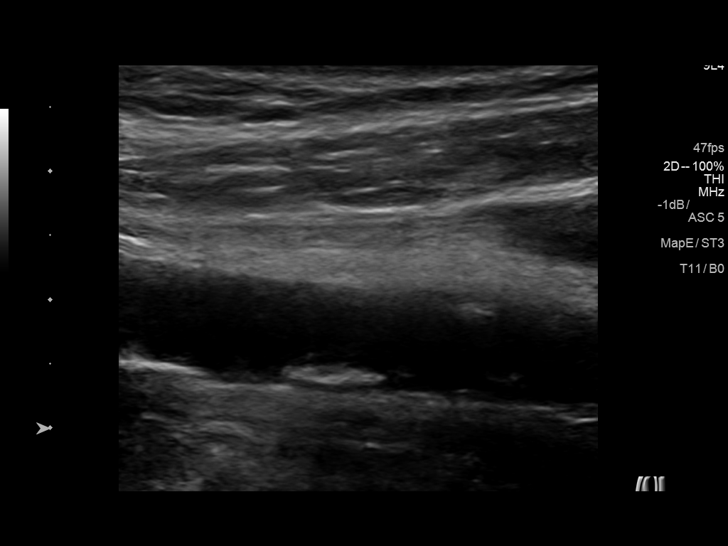
[im 18/104]
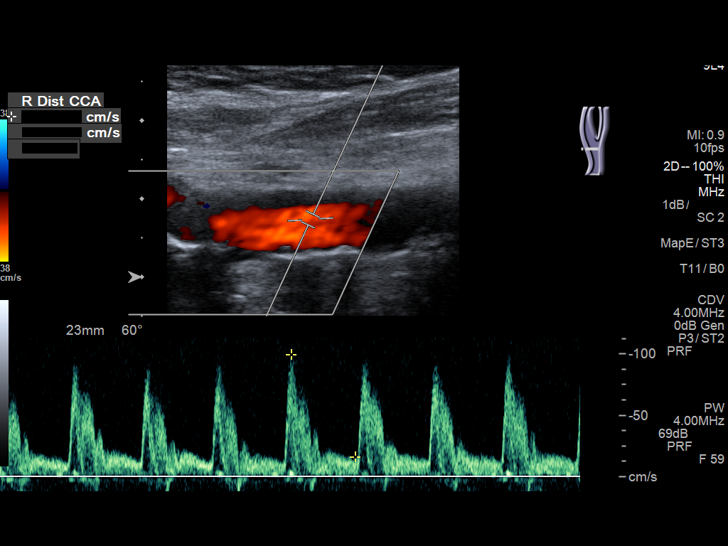
[im 27/104]
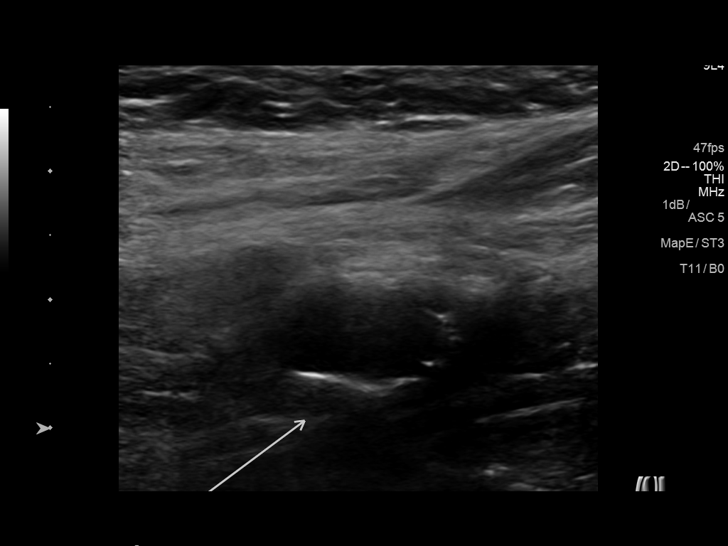
[im 36/104]
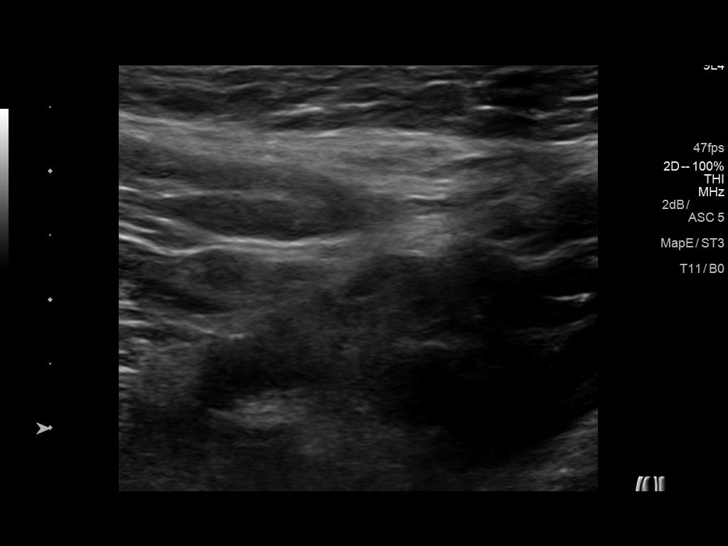
[im 45/104]
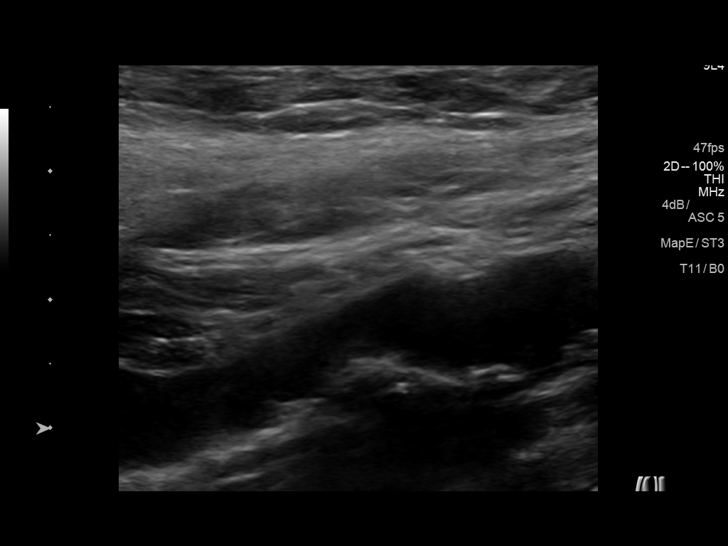
[im 54/104]
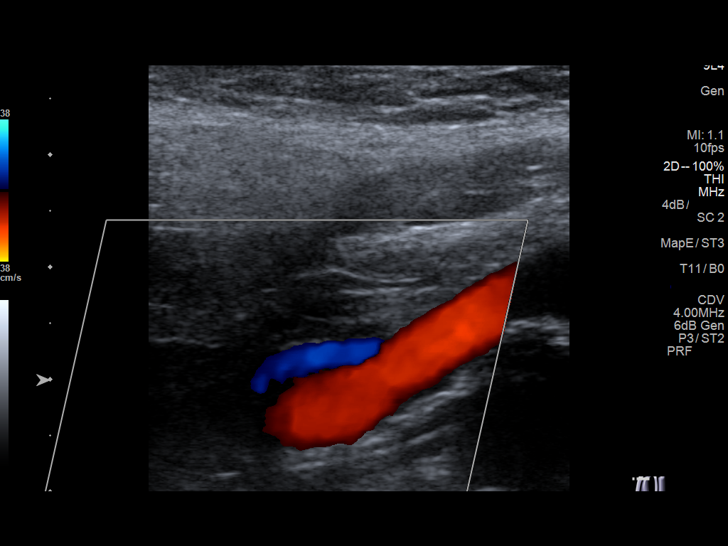
[im 59/104]
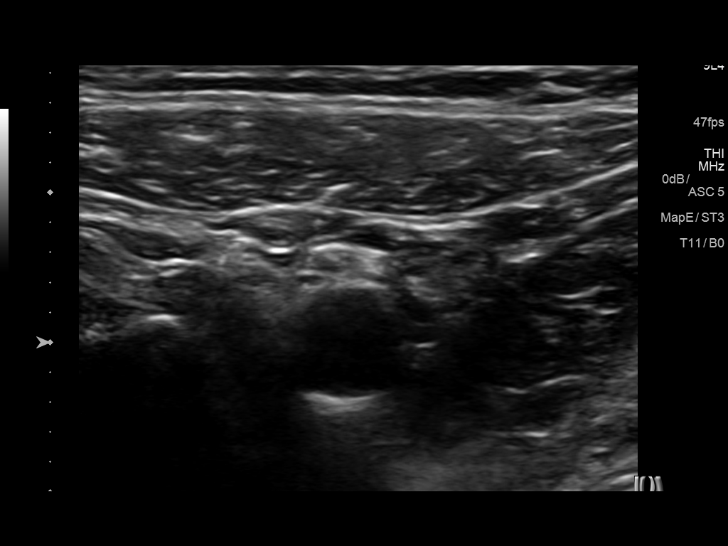
[im 68/104]
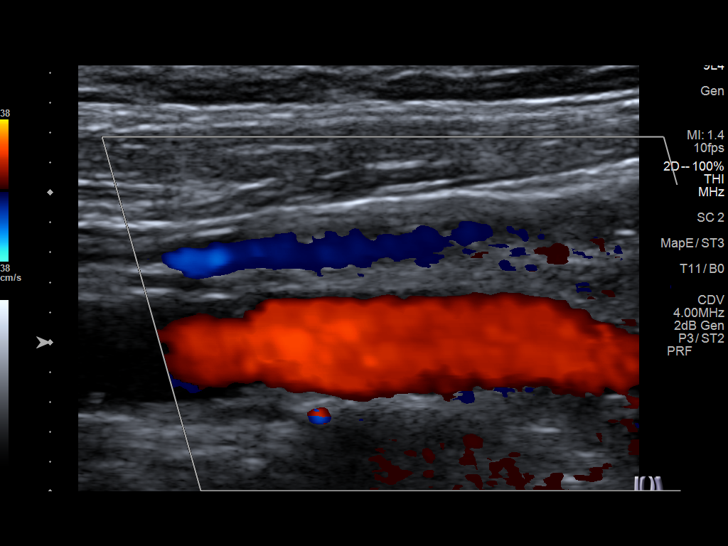
[im 77/104]
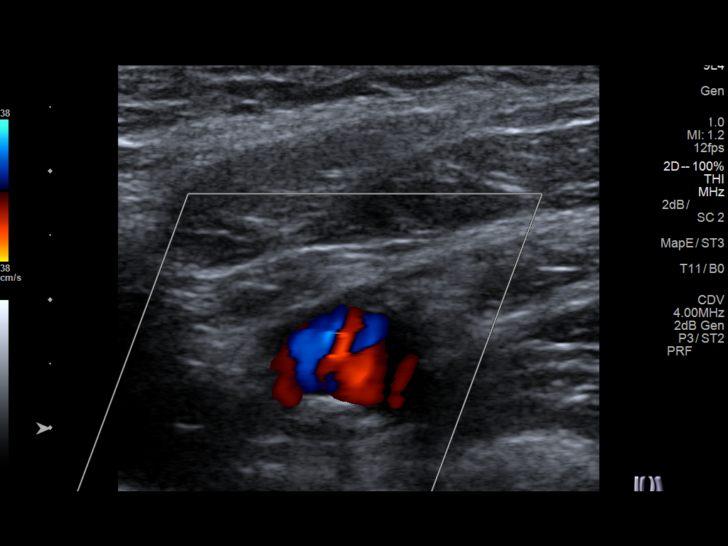
[im 86/104]
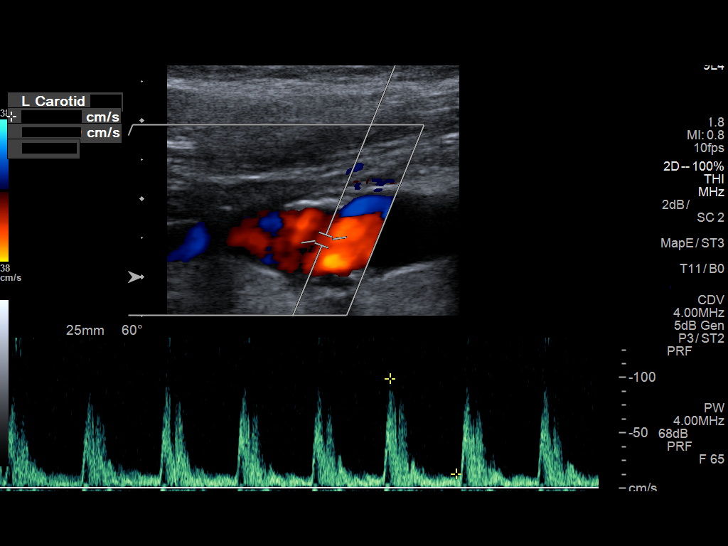
[im 95/104]
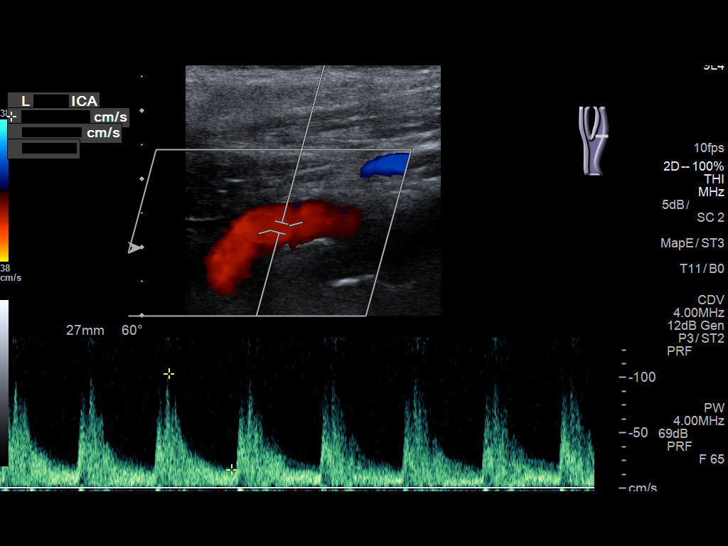
[im 104/104]
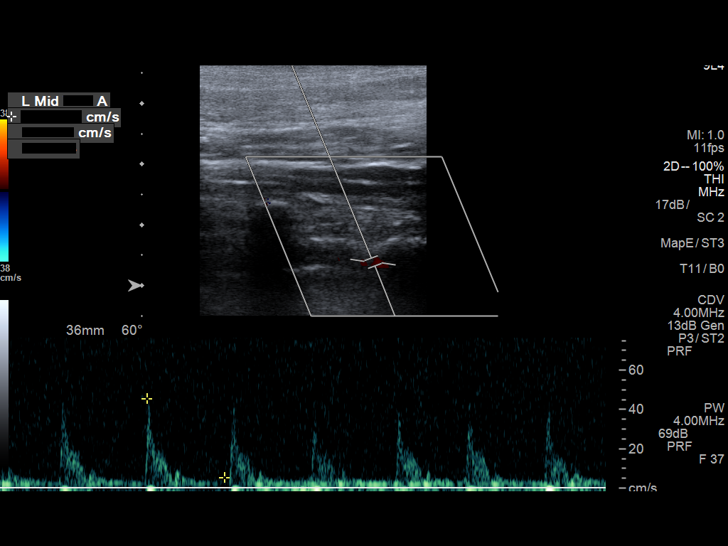

[13 of 24 positions shown; findings below may reference images not displayed]

FINDINGS: Criteria: Quantification of carotid stenosis is based on velocity
parameters that correlate the residual internal carotid diameter
with NASCET-based stenosis levels, using the diameter of the distal
internal carotid lumen as the denominator for stenosis measurement.

The following velocity measurements were obtained:

RIGHT

ICA:  149/30 cm/sec

CCA:  117/20 cm/sec

SYSTOLIC ICA/CCA RATIO:

DIASTOLIC ICA/CCA RATIO:

ECA:  127 cm/sec

LEFT

ICA:  122/26 cm/sec

CCA:  141/21 cm/sec

SYSTOLIC ICA/CCA RATIO:

DIASTOLIC ICA/CCA RATIO:

ECA:  136 cm/sec

RIGHT CAROTID ARTERY: Mild focal heterogeneous atherosclerotic
plaque in the mid and distal common carotid artery extending into
the proximal internal carotid artery. By peak systolic velocity
criteria the estimated stenosis falls in the 50- 69% diameter range.

RIGHT VERTEBRAL ARTERY:  Patent with normal antegrade flow.

LEFT CAROTID ARTERY: Heterogeneous atherosclerotic plaque in the
distal common carotid artery extending into the proximal internal
carotid artery. By peak systolic velocity criteria the estimated
stenosis remains less than 50%.

LEFT VERTEBRAL ARTERY:  Patent with antegrade flow.
IMPRESSION: 1. Moderate (50-69%) stenosis proximal right internal carotid artery
secondary to heterogenous atherosclerotic plaque.
2. Mild (1-49%) stenosis proximal left internal carotid artery
secondary to heterogenous atherosclerotic plaque.
3. Vertebral arteries are patent with normal antegrade flow.

## 2018-09-16 ENCOUNTER — Encounter: Payer: Self-pay | Admitting: *Deleted

## 2018-09-26 ENCOUNTER — Other Ambulatory Visit: Payer: Self-pay | Admitting: Family Medicine

## 2018-10-04 ENCOUNTER — Other Ambulatory Visit: Payer: Self-pay | Admitting: Family Medicine

## 2018-11-07 ENCOUNTER — Other Ambulatory Visit: Payer: Self-pay

## 2018-11-07 ENCOUNTER — Other Ambulatory Visit: Payer: Medicare HMO

## 2018-11-07 DIAGNOSIS — E119 Type 2 diabetes mellitus without complications: Secondary | ICD-10-CM

## 2018-11-07 DIAGNOSIS — I10 Essential (primary) hypertension: Secondary | ICD-10-CM

## 2018-11-07 DIAGNOSIS — Z79899 Other long term (current) drug therapy: Secondary | ICD-10-CM | POA: Diagnosis not present

## 2018-11-07 DIAGNOSIS — E785 Hyperlipidemia, unspecified: Secondary | ICD-10-CM

## 2018-11-08 LAB — COMPREHENSIVE METABOLIC PANEL
AG Ratio: 1.7 (calc) (ref 1.0–2.5)
ALT: 12 U/L (ref 9–46)
AST: 15 U/L (ref 10–35)
Albumin: 4 g/dL (ref 3.6–5.1)
Alkaline phosphatase (APISO): 60 U/L (ref 35–144)
BUN/Creatinine Ratio: 13 (calc) (ref 6–22)
BUN: 19 mg/dL (ref 7–25)
CO2: 25 mmol/L (ref 20–32)
Calcium: 8.8 mg/dL (ref 8.6–10.3)
Chloride: 105 mmol/L (ref 98–110)
Creat: 1.5 mg/dL — ABNORMAL HIGH (ref 0.70–1.11)
Globulin: 2.3 g/dL (calc) (ref 1.9–3.7)
Glucose, Bld: 149 mg/dL — ABNORMAL HIGH (ref 65–99)
Potassium: 4.5 mmol/L (ref 3.5–5.3)
Sodium: 137 mmol/L (ref 135–146)
Total Bilirubin: 0.4 mg/dL (ref 0.2–1.2)
Total Protein: 6.3 g/dL (ref 6.1–8.1)

## 2018-11-08 LAB — LIPID PANEL
Cholesterol: 124 mg/dL (ref <200)
HDL: 36 mg/dL — ABNORMAL LOW (ref >=40)
LDL Cholesterol (Calc): 67 mg/dL (calc)
Non-HDL Cholesterol (Calc): 88 mg/dL (calc) (ref <130)
Total CHOL/HDL Ratio: 3.4 (calc) (ref <5.0)
Triglycerides: 120 mg/dL (ref <150)

## 2018-11-08 LAB — HEMOGLOBIN A1C
Hgb A1c MFr Bld: 6.6 % of total Hgb — ABNORMAL HIGH (ref <5.7)
Mean Plasma Glucose: 143 (calc)
eAG (mmol/L): 7.9 (calc)

## 2018-11-12 ENCOUNTER — Other Ambulatory Visit: Payer: Self-pay

## 2018-11-12 ENCOUNTER — Encounter: Payer: Self-pay | Admitting: Cardiovascular Disease

## 2018-11-12 ENCOUNTER — Ambulatory Visit: Payer: Medicare HMO | Admitting: Cardiovascular Disease

## 2018-11-12 VITALS — BP 134/64 | HR 59 | Temp 98.7°F | Ht 70.0 in | Wt 241.8 lb

## 2018-11-12 DIAGNOSIS — I10 Essential (primary) hypertension: Secondary | ICD-10-CM

## 2018-11-12 DIAGNOSIS — I358 Other nonrheumatic aortic valve disorders: Secondary | ICD-10-CM

## 2018-11-12 DIAGNOSIS — I25118 Atherosclerotic heart disease of native coronary artery with other forms of angina pectoris: Secondary | ICD-10-CM

## 2018-11-12 DIAGNOSIS — R0609 Other forms of dyspnea: Secondary | ICD-10-CM | POA: Diagnosis not present

## 2018-11-12 DIAGNOSIS — I714 Abdominal aortic aneurysm, without rupture, unspecified: Secondary | ICD-10-CM

## 2018-11-12 DIAGNOSIS — Z9861 Coronary angioplasty status: Secondary | ICD-10-CM

## 2018-11-12 DIAGNOSIS — R5383 Other fatigue: Secondary | ICD-10-CM | POA: Diagnosis not present

## 2018-11-12 DIAGNOSIS — R011 Cardiac murmur, unspecified: Secondary | ICD-10-CM | POA: Diagnosis not present

## 2018-11-12 DIAGNOSIS — I6521 Occlusion and stenosis of right carotid artery: Secondary | ICD-10-CM | POA: Diagnosis not present

## 2018-11-12 DIAGNOSIS — E785 Hyperlipidemia, unspecified: Secondary | ICD-10-CM

## 2018-11-12 NOTE — Patient Instructions (Signed)
Medication Instructions: STOP Imdur   STOP Toprol XL Labwork: None  Procedures/Testing: None  Follow-Up: 6 months with [place], PA-c  Any Additional Special Instructions Will Be Listed Below (If Applicable).     If you need a refill on your cardiac medications before your next appointment, please call your pharmacy.     Thank you for choosing [facility] !

## 2018-11-12 NOTE — Progress Notes (Signed)
    SUBJECTIVE: Juan [name] presents for routine follow-up.   Nuclear stress test on 01/24/2018 was normal, EF 71%.  Echocardiogram demonstrated normal left ventricular systolic function, LVEF 60 to 65%, mild concentric LVH, normal regional wall motion, grade 1 diastolic dysfunction, and mild to moderate aortic annular calcification with mildly thickened leaflets but no stenosis.  There was also mild mitral leaflet calcifications but no stenosis.  There was mild left atrial dilatation.  He reportedly underwent coronary angioplasty in 1989 in [place].  Most recent abdominal aortic ultrasound performed on 08/02/2017 demonstrates abdominal aorta is 4.2 cm in largest aortic diameter. It was previously 4 cm in May 2018.  RICA stenosis was 1 to 39% on 07/27/2016 Dopplers.  He is here with his wife.  He continues to experience exertional fatigue.  His wife said he used to have pretty significant snoring problems but these have improved more recently.  He denies morning headaches.  His wife said he easily takes a nap in the afternoon.  He denies exertional chest pain.     Review of Systems: As per "subjective", otherwise negative.  Allergies  Allergen Reactions  . Jardiance [Empagliflozin]     Severe nausea-     Current Outpatient Medications  Medication Sig Dispense Refill  . allopurinol (ZYLOPRIM) 300 MG tablet Take 1 tablet (300 mg total) by mouth daily. 90 tablet 3  . aspirin EC 81 MG tablet Take 81 mg by mouth daily.    . Blood Glucose Monitoring Suppl (TRUE METRIX METER) w/Device KIT Use as Directed 1 kit 1  . diltiazem (CARDIZEM CD) 240 MG 24 hr capsule TAKE 1 CAPSULE EVERY DAY 90 capsule 3  . doxazosin (CARDURA) 4 MG tablet TAKE 1 TABLET EVERY DAY 90 tablet 3  . glipiZIDE (GLUCOTROL) 5 MG tablet TAKE 1 TABLET TWO TIMES DAILY BEFORE A MEAL 180 tablet 2  . glucose blood (TRUE METRIX BLOOD GLUCOSE TEST) test strip Check BS QD - dx: e11.9 100 each 12  . isosorbide  mononitrate (IMDUR) 30 MG 24 hr tablet Take 1 tablet (30 mg total) by mouth daily. 90 tablet 3  . Lancet Devices (PRODIGY LANCING DEVICE) MISC Use bid DX e11.9 1 each 2  . lisinopril (PRINIVIL,ZESTRIL) 40 MG tablet TAKE 1 TABLET EVERY DAY (NEED MD APPOINTMENT) 90 tablet 3  . metFORMIN (GLUCOPHAGE) 500 MG tablet TAKE 1 TABLET TWICE DAILY WITH MEALS (NEED MD APPOINTMENT) 180 tablet 3  . metoprolol succinate (TOPROL-XL) 25 MG 24 hr tablet TAKE 2 TABLETS EVERY DAY 180 tablet 3  . Multiple Vitamin (MULTIVITAMIN WITH MINERALS) TABS Take 1 tablet by mouth daily.    . Omega-3 Fatty Acids (FISH OIL PO) Take by mouth.    . rosuvastatin (CRESTOR) 20 MG tablet TAKE 1 TABLET (20 MG TOTAL) BY MOUTH DAILY. 90 tablet 3  . sitaGLIPtin (JANUVIA) 100 MG tablet Take 1 tablet (100 mg total) by mouth daily. 90 tablet 1  . TRUEPLUS LANCETS 33G MISC Check BS QD - dx: e11.9 100 each 3   No current facility-administered medications for this visit.     Past Medical History:  Diagnosis Date  . AAA (abdominal aortic aneurysm) (HCC) 8/12   3.5cm  . CAD (coronary artery disease)   . CKD (chronic kidney disease) stage 3, GFR 30-59 ml/min (HCC)   . Colon polyps   . Diabetes mellitus without complication (HCC)   . 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 Hyperlipidemia   . Hypertension   . More   than 50 percent stenosis of right internal carotid artery    50-69% (12/2015)  . Neuromuscular disorder ([place])    L2-3,L5-S1 bulging disc /nerve impingement  . PAD (peripheral artery disease) ([place])     Past Surgical History:  Procedure Laterality Date  . COLONOSCOPY N/A 09/27/2012   Procedure: COLONOSCOPY;  Surgeon: Daneil Dolin, MD;  Location[facility];  Service: Endoscopy;  Laterality: N/A;  9:30  . CORONARY ANGIOPLASTY     1989  . TONSILLECTOMY      Social History   Socioeconomic History  . Marital status: Married    Spouse name: Not on file  . Number of children: Not on file  . Years of  education: Not on file  . Highest education level: Not on file  Occupational History  . Occupation: retired    Fish farm manager: RETIRED    Comment: [employer]   Social Needs  . Financial resource strain: Not on file  . Food insecurity    Worry: Not on file    Inability: Not on file  . Transportation needs    Medical: Not on file    Non-medical: Not on file  Tobacco Use  . Smoking status: Former Smoker    Years: 35.00    Types: Cigarettes    Quit date: 01/02/1988    Years since quitting: 30.8  . Smokeless tobacco: Never Used  Substance and Sexual Activity  . Alcohol use: Not Currently    Comment: rare beer, no history of ETOH abuse  . Drug use: No  . Sexual activity: Not on file  Lifestyle  . Physical activity    Days per week: Not on file    Minutes per session: Not on file  . Stress: Not on file  Relationships  . Social Herbalist on phone: Not on file    Gets together: Not on file    Attends religious service: Not on file    Active member of club or organization: Not on file    Attends meetings of clubs or organizations: Not on file    Relationship status: Not on file  . Intimate partner violence    Fear of current or ex partner: Not on file    Emotionally abused: Not on file    Physically abused: Not on file    Forced sexual activity: Not on file  Other Topics Concern  . Not on file  Social History Narrative  . Not on file     Vitals:   11/12/18 1503  BP: 134/64  Pulse: (!) 59  Temp: 98.7 F (37.1 C)  SpO2: 98%  Height: 5' 10" (1.778 m)    Wt Readings from Last 3 Encounters:  05/30/18 240 lb (108.9 kg)  04/15/18 239 lb (108.4 kg)  01/14/18 234 lb 9.6 oz (106.4 kg)     PHYSICAL EXAM General: NAD HEENT: Normal. Neck: No JVD, no thyromegaly. Lungs: Clear to auscultation bilaterally with normal respiratory effort. CV: Regular rate and rhythm, normal S1/S2, no S3/S4, no murmur. No pretibial or periankle edema.  No carotid bruit.   Abdomen:  Soft, nontender, no distention.  Neurologic: Alert and oriented.  Psych: Normal affect. Skin: Normal. Musculoskeletal: No gross deformities.    ECG: Reviewed above under Subjective   Labs: Lab Results  Component Value Date/Time   K 4.5 11/07/2018 08:32 AM   BUN 19 11/07/2018 08:32 AM   CREATININE 1.50 (H) 11/07/2018 08:32 AM   ALT 12 11/07/2018 08:32 AM  HGB 11.8 (L) 05/27/2018 08:47 AM     Lipids: Lab Results  Component Value Date/Time   LDLCALC 67 11/07/2018 08:32 AM   CHOL 124 11/07/2018 08:32 AM   TRIG 120 11/07/2018 08:32 AM   HDL 36 (L) 11/07/2018 08:32 AM       ASSESSMENT AND PLAN:  1.  Coronary artery disease: He denies chest pain but describes exertional fatigue.  He has a history of angioplasty in 1989 in [place].  Nuclear stress test in November 2019 was normal as reviewed above.  Continue aspirin and rosuvastatin.  I will discontinue Imdur and Toprol-XL, the latter to see if this helps improve his symptoms to any degree.  2.  Hypertension: Blood pressure is normal.  This will need further monitoring given discontinuation of Imdur and Toprol-XL.  3. Hyperlipidemia: Currently on rosuvastatin 20 mg.  Lipids reviewed above.  4. Abdominal aortic aneurysm: 4.2 cm in maximal diameter as detailed above. Followed by vascular surgery.  5. Right internal carotid artery stenosis: Mild 1 to 39% stenosis as detailed above. Followed by vascular surgery. On statin therapy.  6.  Cardiac murmur/aortic sclerosis: No significant stenosis seen by echocardiogram in November 2019.  7.  Fatigue: PCP notes from March mention ruling out low testosterone and sleep apnea as possible etiologies.  He is scheduled to see his PCP in 2 days.  I am stopping Toprol-XL.   Disposition: Follow up 6 months   Suresh Koneswaran, M.D., F.A.C.C. 

## 2018-11-14 ENCOUNTER — Ambulatory Visit (HOSPITAL_COMMUNITY)
Admission: RE | Admit: 2018-11-14 | Discharge: 2018-11-14 | Disposition: A | Discharge status: Home / Self Care | Payer: Medicare HMO | Source: Ambulatory Visit | Attending: Family Medicine | Admitting: Family Medicine

## 2018-11-14 ENCOUNTER — Ambulatory Visit (INDEPENDENT_AMBULATORY_CARE_PROVIDER_SITE_OTHER): Payer: Medicare HMO | Admitting: Family Medicine

## 2018-11-14 ENCOUNTER — Other Ambulatory Visit: Payer: Self-pay

## 2018-11-14 VITALS — BP 148/70 | HR 58 | Temp 98.1°F | Resp 18 | Ht 70.0 in | Wt 241.0 lb

## 2018-11-14 DIAGNOSIS — I739 Peripheral vascular disease, unspecified: Secondary | ICD-10-CM

## 2018-11-14 DIAGNOSIS — E119 Type 2 diabetes mellitus without complications: Secondary | ICD-10-CM

## 2018-11-14 DIAGNOSIS — N183 Chronic kidney disease, stage 3 unspecified: Secondary | ICD-10-CM

## 2018-11-14 DIAGNOSIS — Z0000 Encounter for general adult medical examination without abnormal findings: Secondary | ICD-10-CM | POA: Diagnosis not present

## 2018-11-14 DIAGNOSIS — R5382 Chronic fatigue, unspecified: Secondary | ICD-10-CM | POA: Diagnosis not present

## 2018-11-14 DIAGNOSIS — I2510 Atherosclerotic heart disease of native coronary artery without angina pectoris: Secondary | ICD-10-CM

## 2018-11-14 DIAGNOSIS — I714 Abdominal aortic aneurysm, without rupture, unspecified: Secondary | ICD-10-CM

## 2018-11-14 DIAGNOSIS — E7800 Pure hypercholesterolemia, unspecified: Secondary | ICD-10-CM | POA: Diagnosis not present

## 2018-11-14 DIAGNOSIS — Z23 Encounter for immunization: Secondary | ICD-10-CM | POA: Diagnosis not present

## 2018-11-14 DIAGNOSIS — M542 Cervicalgia: Secondary | ICD-10-CM

## 2018-11-14 DIAGNOSIS — E1151 Type 2 diabetes mellitus with diabetic peripheral angiopathy without gangrene: Secondary | ICD-10-CM | POA: Diagnosis not present

## 2018-11-14 DIAGNOSIS — I6529 Occlusion and stenosis of unspecified carotid artery: Secondary | ICD-10-CM | POA: Diagnosis not present

## 2018-11-14 DIAGNOSIS — R6889 Other general symptoms and signs: Secondary | ICD-10-CM | POA: Diagnosis not present

## 2018-11-14 DIAGNOSIS — E1122 Type 2 diabetes mellitus with diabetic chronic kidney disease: Secondary | ICD-10-CM | POA: Diagnosis not present

## 2018-11-14 NOTE — Progress Notes (Signed)
Subjective:    Patient ID: Juan [name], male    DOB: February 18, 1941, 78 y.o.   MRN: [id number]  Medication Refill    05/2018 Patient is here today with his wife.  He has a significant past medical history of coronary artery disease status post angioplasty when he was in [place].  He also has a history of peripheral artery disease as well as asymptomatic carotid artery stenosis.  Patient is due to see his vascular specialist in May to recheck his right-sided carotid artery stenosis as well as his AAA.  He denies any chest pain today.  He denies any shortness of breath or dyspnea on exertion.  At that time, my plan was: Blood pressure today is well controlled.  Chronic kidney disease is stable.  Cholesterol is outstanding.  I like to keep his LDL cholesterol below 70.  There is room for improvement with his diabetes.  I like his hemoglobin A1c to be less than 6.5.  However encouraged the patient to make therapeutic lifestyle changes in order to achieve this.  I recommended diet exercise and weight loss as a means to accomplish this.  I would recheck his lab work in 6 months.  I would like the patient to have carotid Dopplers as well as an ultrasound to evaluate his AAA however I will defer this to his vascular specialist who he sees in May.  He also reports some fatigue.  I believe some of this is due to deconditioning.  If his fatigue does not improve with increasing exercise, we could check a TSH, testosterone level, and consider screening for obstructive sleep apnea  11/14/18 Here for CPE.  Due to age, I would not recommend colonoscopy or prostate cancer screening.  Immunization records are listed below: Immunization History  Administered Date(s) Administered  . Influenza Split 11/16/2009  . Influenza,inj,Quad PF,6+ Mos 12/24/2012, 11/20/2013, 12/03/2014, 11/26/2015, 12/14/2016, 11/22/2017  . Pneumococcal Conjugate-13 02/18/2013  . Pneumococcal Polysaccharide-23 03/14/2007  . Zoster 08/27/2012   Is due for flu shot today.   Diabetic eye exam is up to date.  Denies any problems with memory loss, depression, or falls.  Patient saw his cardiologist earlier this week.  They recommended stopping metoprolol as well as Imdur due to fatigue.  Patient states that his fatigue is mainly in his neck.  He states that with very little activity, the muscles in his neck become weak.  He feels pain in his neck.  His upper back and shoulders feel weak like they are unable to support the weight of his head.  He has to rest.  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  He denies any muscle pains in his arms.  He denies any cervical radiculopathy symptoms.  He denies any weakness or muscle pain in his legs.  He denies any falls.  He denies any numbness or tingling in his legs.  He denies any chest pain or shortness of breath.  He does complain of just generalized fatigue however.  He states that he just feels weak and tired. Appointment on 11/07/2018  Component Date Value Ref Range Status  . Cholesterol 11/07/2018 124  <200 mg/dL Final  . HDL 11/07/2018 36* > OR = 40 mg/dL Final  . Triglycerides 11/07/2018 120  <150 mg/dL Final  . LDL Cholesterol (Calc) 11/07/2018 67  mg/dL (calc) Final   Comment: Reference range: <100 . Desirable range <100 mg/dL for primary prevention;   <70 mg/dL for patients with CHD or  diabetic patients  with > or = 2 CHD risk factors. Marland Kitchen LDL-C is now calculated using the Martin-Hopkins  calculation, which is a validated novel method providing  better accuracy than the Friedewald equation in the  estimation of LDL-C.  Cresenciano Genre et al. Annamaria Helling. 5093;267(12): 2061-2068  (http://education.[facility].com/faq/FAQ164)   . Total CHOL/HDL Ratio 11/07/2018 3.4  <5.0 (calc) Final  . Non-HDL Cholesterol (Calc) 11/07/2018 88  <130 mg/dL (calc) Final   Comment: For patients with diabetes plus 1 major ASCVD risk  factor, treating to a non-HDL-C goal of <100  mg/dL  (LDL-C of <70 mg/dL) is considered a therapeutic  option.   . Hgb A1c MFr Bld 11/07/2018 6.6* <5.7 % of total Hgb Final   Comment: For someone without known diabetes, a hemoglobin A1c value of 6.5% or greater indicates that they may have  diabetes and this should be confirmed with a follow-up  test. . For someone with known diabetes, a value <7% indicates  that their diabetes is well controlled and a value  greater than or equal to 7% indicates suboptimal  control. A1c targets should be individualized based on  duration of diabetes, age, comorbid conditions, and  other considerations. . Currently, no consensus exists regarding use of hemoglobin A1c for diagnosis of diabetes for children. .   . Mean Plasma Glucose 11/07/2018 143  (calc) Final  . eAG (mmol/L) 11/07/2018 7.9  (calc) Final  . Glucose, Bld 11/07/2018 149* 65 - 99 mg/dL Final   Comment: .            Fasting reference interval . For someone without known diabetes, a glucose value >125 mg/dL indicates that they may have diabetes and this should be confirmed with a follow-up test. .   . BUN 11/07/2018 19  7 - 25 mg/dL Final  . Creat 11/07/2018 1.50* 0.70 - 1.11 mg/dL Final   Comment: For patients >25 years of age, the reference limit for Creatinine is approximately 13% higher for people identified as African-American. .   Havery Moros Ratio 11/07/2018 13  6 - 22 (calc) Final  . Sodium 11/07/2018 137  135 - 146 mmol/L Final  . Potassium 11/07/2018 4.5  3.5 - 5.3 mmol/L Final  . Chloride 11/07/2018 105  98 - 110 mmol/L Final  . CO2 11/07/2018 25  20 - 32 mmol/L Final  . Calcium 11/07/2018 8.8  8.6 - 10.3 mg/dL Final  . Total Protein 11/07/2018 6.3  6.1 - 8.1 g/dL Final  . Albumin 11/07/2018 4.0  3.6 - 5.1 g/dL Final  . Globulin 11/07/2018 2.3  1.9 - 3.7 g/dL (calc) Final  . AG Ratio 11/07/2018 1.7  1.0 - 2.5 (calc) Final  . Total Bilirubin 11/07/2018 0.4  0.2 - 1.2 mg/dL Final  . Alkaline  phosphatase (APISO) 11/07/2018 60  35 - 144 U/L Final  . AST 11/07/2018 15  10 - 35 U/L Final  . ALT 11/07/2018 12  9 - 46 U/L Final    Past Medical History:  Diagnosis Date  . AAA (abdominal aortic aneurysm) ([place]) 8/12   3.5cm  . CAD (coronary artery disease)   . CKD (chronic kidney disease) stage 3, GFR 30-59 ml/min (HCC)   . Colon polyps   . Diabetes mellitus without complication ([place])   . 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 Hyperlipidemia   . Hypertension   . 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 Neuromuscular disorder ([place])  L2-3,L5-S1 bulging disc /nerve impingement  . PAD (peripheral artery disease) ([place])    Past Surgical History:  Procedure Laterality Date  . COLONOSCOPY N/A 09/27/2012   Procedure: COLONOSCOPY;  Surgeon: Daneil Dolin, MD;  Location: [facility];  Service: Endoscopy;  Laterality: N/A;  9:30  . CORONARY ANGIOPLASTY     1989  . TONSILLECTOMY     Current Outpatient Medications on File Prior to Visit  Medication Sig Dispense Refill  . allopurinol (ZYLOPRIM) 300 MG tablet Take 1 tablet (300 mg total) by mouth daily. 90 tablet 3  . aspirin EC 81 MG tablet Take 81 mg by mouth daily.    . Blood Glucose Monitoring Suppl (TRUE METRIX METER) w/Device KIT Use as Directed 1 kit 1  . diltiazem (CARDIZEM CD) 240 MG 24 hr capsule TAKE 1 CAPSULE EVERY DAY 90 capsule 3  . doxazosin (CARDURA) 4 MG tablet TAKE 1 TABLET EVERY DAY 90 tablet 3  . glipiZIDE (GLUCOTROL) 5 MG tablet TAKE 1 TABLET TWO TIMES DAILY BEFORE A MEAL 180 tablet 2  . glucose blood (TRUE METRIX BLOOD GLUCOSE TEST) test strip Check BS QD - dx: e11.9 100 each 12  . Lancet Devices (PRODIGY LANCING DEVICE) MISC Use bid DX e11.9 1 each 2  . lisinopril (PRINIVIL,ZESTRIL) 40 MG tablet TAKE 1 TABLET EVERY DAY (NEED MD APPOINTMENT) 90 tablet 3  . metFORMIN (GLUCOPHAGE) 500 MG tablet TAKE 1 TABLET TWICE DAILY WITH MEALS (NEED MD APPOINTMENT) 180  tablet 3  . Multiple Vitamin (MULTIVITAMIN WITH MINERALS) TABS Take 1 tablet by mouth daily.    . Omega-3 Fatty Acids (FISH OIL PO) Take by mouth.    . rosuvastatin (CRESTOR) 20 MG tablet TAKE 1 TABLET (20 MG TOTAL) BY MOUTH DAILY. 90 tablet 3  . sitaGLIPtin (JANUVIA) 100 MG tablet Take 1 tablet (100 mg total) by mouth daily. 90 tablet 1  . TRUEPLUS LANCETS 33G MISC Check BS QD - dx: e11.9 100 each 3   No current facility-administered medications on file prior to visit.    Allergies  Allergen Reactions  . Jardiance [Empagliflozin]     Severe nausea-    Social History   Socioeconomic History  . Marital status: Married    Spouse name: Not on file  . Number of children: Not on file  . Years of education: Not on file  . Highest education level: Not on file  Occupational History  . Occupation: retired    Fish farm manager: RETIRED    Comment: [employer]   Social Needs  . Financial resource strain: Not on file  . Food insecurity    Worry: Not on file    Inability: Not on file  . Transportation needs    Medical: Not on file    Non-medical: Not on file  Tobacco Use  . Smoking status: Former Smoker    Years: 35.00    Types: Cigarettes    Quit date: 01/02/1988    Years since quitting: 30.8  . Smokeless tobacco: Never Used  Substance and Sexual Activity  . Alcohol use: Not Currently    Comment: rare beer, no history of ETOH abuse  . Drug use: No  . Sexual activity: Not on file  Lifestyle  . Physical activity    Days per week: Not on file    Minutes per session: Not on file  . Stress: Not on file  Relationships  . Social Herbalist on phone: Not on file    Gets together: Not  on file    Attends religious service: Not on file    Active member of club or organization: Not on file    Attends meetings of clubs or organizations: Not on file    Relationship status: Not on file  . Intimate partner violence    Fear of current or ex partner: Not on file    Emotionally abused:  Not on file    Physically abused: Not on file    Forced sexual activity: Not on file  Other Topics Concern  . Not on file  Social History Narrative  . Not on file   Family History  Problem Relation Age of Onset  . Diabetes Mother   . Heart disease Mother        After age 11  . Heart attack Mother   . Hypertension Mother   . Diabetes Father   . Heart disease Father        After age 31  . Hypertension Father   . Heart attack Father   . Brain cancer Sister   . Cancer Sister        Brain  . Diabetes Sister   . Hypertension Sister   . Cancer Brother        Chest Tumor  . Diabetes Brother   . Hypertension Brother   . Deep vein thrombosis Sister   . Diabetes Sister   . Diabetes Brother        Bilateral leg  . Colon cancer Neg Hx       Review of Systems  All other systems reviewed and are negative.      Objective:   Physical Exam  Constitutional: He is oriented to person, place, and time. He appears well-developed and well-nourished. No distress.  HENT:  Head: Normocephalic and atraumatic.  Right Ear: External ear normal.  Left Ear: External ear normal.  Nose: Nose normal.  Mouth/Throat: Oropharynx is clear and moist. No oropharyngeal exudate.  Eyes: Pupils are equal, round, and reactive to light. Conjunctivae are normal. Right eye exhibits no discharge. Left eye exhibits no discharge. No scleral icterus.  Neck: Normal range of motion. Neck supple. No JVD present. No tracheal deviation present. No thyromegaly present.  Cardiovascular: Normal rate, regular rhythm, normal heart sounds and intact distal pulses. Exam reveals no gallop and no friction rub.  No murmur heard. Pulmonary/Chest: Effort normal and breath sounds normal. No stridor. No respiratory distress. He has no wheezes. He has no rales. He exhibits no tenderness.  Abdominal: Soft. Bowel sounds are normal. He exhibits no distension and no mass. There is no abdominal tenderness. There is no rebound and no  guarding.  Musculoskeletal: Normal range of motion.        General: No tenderness or edema.  Lymphadenopathy:    He has no cervical adenopathy.  Neurological: He is alert and oriented to person, place, and time. He has normal reflexes. No cranial nerve deficit. He exhibits normal muscle tone. Coordination normal.  Skin: Skin is warm. No rash noted. He is not diaphoretic. No erythema. No pallor.  Psychiatric: He has a normal mood and affect. His behavior is normal. Judgment and thought content normal.  Vitals reviewed.         Assessment & Plan:  1. Controlled type 2 diabetes mellitus without complication, without long-term current use of insulin (HCC) Most recent hemoglobin A1c is outstanding at 6.6.  I will make no changes in his diabetes medicine at the present time.  Diabetic foot exam  was performed today and is normal  2. Pure hypercholesterolemia LDL cholesterol is below 70.  He denies any symptoms of statin induced myopathy.  I believe the pain in his neck is more likely due to degenerative disc disease.  3. CKD (chronic kidney disease) stage 3, GFR 30-59 ml/min (HCC) Renal function is stable.  If x-ray confirms severe degenerative disc disease, the patient should not take NSAIDs.  Therefore I may recommend seeing orthopedics for possible cortisone injections in the neck  4. PAD (peripheral artery disease) ([place]) Patient has palpable pulses in his feet.  He denies any claudication  5. ASCVD (arteriosclerotic cardiovascular disease) Currently on aspirin.  Blood pressures well controlled.  LDL cholesterol is less than 70.  6. General medical exam Patient received his flu shot.  Depression and fall screens are normal.  Denies any problems with memory loss.  Cancer screening is not recommended at age 60.  37. Stenosis of carotid artery, unspecified laterality Patient was unable to see his vascular surgeon due to COVID-19.  I will monitor the carotid artery stenosis with carotid  Dopplers.  He does have a right-sided bruit today on exam - US Carotid Duplex Bilateral; Future  8. Abdominal aortic aneurysm (AAA) without rupture ([place]) Obtain ultrasound to monitor his AAA - VAS [place] AAA DUPLEX; Future  9. Neck pain Suspect degenerative disc disease.  Obtain C-spine x-ray - DG Cervical Spine Complete; Future  10. Chronic fatigue Most likely age-related.  I will check B12, testosterone, TSH.  Consider obstructive sleep apnea if labs are normal and fatigue does not improve after he discontinues Toprol and isosorbide mononitrate - Vitamin B12 - TSH - Testosterone Total,Free,Bio, Males

## 2018-11-15 LAB — TSH: TSH: 3.56 mIU/L (ref 0.40–4.50)

## 2018-11-15 LAB — TESTOSTERONE TOTAL,FREE,BIO, MALES
Albumin: 4.1 g/dL (ref 3.6–5.1)
Sex Hormone Binding: 21 nmol/L — ABNORMAL LOW (ref 22–77)
Testosterone: 195 ng/dL — ABNORMAL LOW (ref 250–827)

## 2018-11-15 LAB — VITAMIN B12: Vitamin B-12: 988 pg/mL (ref 200–1100)

## 2018-11-19 ENCOUNTER — Encounter: Payer: Self-pay | Admitting: Family Medicine

## 2018-11-19 ENCOUNTER — Ambulatory Visit (HOSPITAL_COMMUNITY)
Admission: RE | Admit: 2018-11-19 | Discharge: 2018-11-19 | Disposition: A | Discharge status: Home / Self Care | Payer: Medicare HMO | Source: Ambulatory Visit | Attending: Family Medicine | Admitting: Family Medicine

## 2018-11-19 ENCOUNTER — Other Ambulatory Visit: Payer: Self-pay

## 2018-11-19 DIAGNOSIS — I714 Abdominal aortic aneurysm, without rupture, unspecified: Secondary | ICD-10-CM

## 2018-11-20 ENCOUNTER — Other Ambulatory Visit: Payer: Self-pay | Admitting: Family Medicine

## 2018-11-23 ENCOUNTER — Other Ambulatory Visit: Payer: Self-pay | Admitting: Family Medicine

## 2018-11-26 ENCOUNTER — Other Ambulatory Visit: Payer: Self-pay | Admitting: Family Medicine

## 2018-11-26 DIAGNOSIS — I714 Abdominal aortic aneurysm, without rupture, unspecified: Secondary | ICD-10-CM

## 2018-11-29 ENCOUNTER — Other Ambulatory Visit: Payer: Self-pay | Admitting: Family Medicine

## 2018-12-02 ENCOUNTER — Ambulatory Visit (HOSPITAL_COMMUNITY)
Admission: RE | Admit: 2018-12-02 | Discharge: 2018-12-02 | Disposition: A | Discharge status: Home / Self Care | Payer: Medicare HMO | Source: Ambulatory Visit | Attending: Family Medicine | Admitting: Family Medicine

## 2018-12-02 ENCOUNTER — Other Ambulatory Visit: Payer: Self-pay

## 2018-12-02 DIAGNOSIS — I6523 Occlusion and stenosis of bilateral carotid arteries: Secondary | ICD-10-CM | POA: Diagnosis not present

## 2018-12-02 DIAGNOSIS — I6529 Occlusion and stenosis of unspecified carotid artery: Secondary | ICD-10-CM | POA: Insufficient documentation

## 2019-02-03 ENCOUNTER — Encounter: Payer: Self-pay | Admitting: Family Medicine

## 2019-02-03 ENCOUNTER — Ambulatory Visit (INDEPENDENT_AMBULATORY_CARE_PROVIDER_SITE_OTHER): Payer: Medicare HMO | Admitting: Family Medicine

## 2019-02-03 ENCOUNTER — Other Ambulatory Visit: Payer: Self-pay

## 2019-02-03 DIAGNOSIS — J069 Acute upper respiratory infection, unspecified: Secondary | ICD-10-CM | POA: Diagnosis not present

## 2019-02-03 MED ORDER — BENZONATATE 100 MG PO CAPS: 100.0000 mg | ORAL_CAPSULE | Freq: Three times a day (TID) | ORAL | 0 refills | Status: DC | PRN

## 2019-02-03 NOTE — Progress Notes (Signed)
Virtual Visit via Telephone Note  I connected with Juan [name] on 02/03/19 at 4:28pm by telephone and verified that I am speaking with the correct person using two identifiers.     Pt location: at home   Physician location:  In office, [facility], Vic Blackbird MD     On call: patient and physician   I discussed the limitations, risks, security and privacy concerns of performing an evaluation and management service by telephone and the availability of in person appointments. I also discussed with the patient that there may be a patient responsible charge related to this service. The patient expressed understanding and agreed to proceed.   History of Present Illness: 3-4 days ago started with mild cough, this progressed so he started using mucinex. Mostly dry cough, on rare occasion he production. No tightness or chest pain, SOB, wheezing.  No fever, no sinus drainage or pressure No known sick contacts  No history of COPD asthma  He has been trying to social distance, No blood sugar issues  Cough is keeping him up at night  No known allergies  His wife has been going to church and they both do the shopping.  He has not been sick.    Observations/Objective: Acute distress noted over the phone.  Assessment and Plan: Progressive cough CONCERn for URI.  He does not have any wheeze or bronchospasm no fever however with his age and his comorbidities as he is getting progressive worsening cough I would recommend he get Covid testing.  He will do this at the local hospital tomorrow.  I will go ahead and start him on Tessalon Perles for cough.Hold on antibiotics as not indicated at this time   Follow Up Instructions:    I discussed the assessment and treatment plan with the patient. The patient was provided an opportunity to ask questions and all were answered. The patient agreed with the plan and demonstrated an understanding of the instructions.   The patient was  advised to call back or seek an in-person evaluation if the symptoms worsen or if the condition fails to improve as anticipated.  I provided 9 minutes of non-face-to-face time during this encounter. End time 4:36pm  Vic Blackbird, MD

## 2019-02-04 ENCOUNTER — Other Ambulatory Visit: Payer: Self-pay

## 2019-02-04 DIAGNOSIS — Z20822 Contact with and (suspected) exposure to covid-19: Secondary | ICD-10-CM

## 2019-02-05 LAB — NOVEL CORONAVIRUS, NAA: SARS-CoV-2, NAA: NOT DETECTED

## 2019-02-06 ENCOUNTER — Encounter: Payer: Self-pay | Admitting: Family Medicine

## 2019-02-07 MED ORDER — AZITHROMYCIN 250 MG PO TABS: ORAL_TABLET | ORAL | 0 refills | Status: DC

## 2019-02-18 ENCOUNTER — Other Ambulatory Visit: Payer: Self-pay | Admitting: Family Medicine

## 2019-02-19 ENCOUNTER — Encounter: Payer: Self-pay | Admitting: Family Medicine

## 2019-02-20 ENCOUNTER — Other Ambulatory Visit: Payer: Self-pay | Admitting: Family Medicine

## 2019-02-20 MED ORDER — HYDROCODONE-HOMATROPINE 5-1.5 MG/5ML PO SYRP: 5.0000 mL | ORAL_SOLUTION | Freq: Three times a day (TID) | ORAL | 0 refills | Status: DC | PRN

## 2019-03-26 DIAGNOSIS — H53002 Unspecified amblyopia, left eye: Secondary | ICD-10-CM | POA: Diagnosis not present

## 2019-03-26 DIAGNOSIS — H25813 Combined forms of age-related cataract, bilateral: Secondary | ICD-10-CM | POA: Diagnosis not present

## 2019-04-09 DIAGNOSIS — H25012 Cortical age-related cataract, left eye: Secondary | ICD-10-CM | POA: Diagnosis not present

## 2019-04-09 DIAGNOSIS — H25011 Cortical age-related cataract, right eye: Secondary | ICD-10-CM | POA: Diagnosis not present

## 2019-04-09 DIAGNOSIS — H2511 Age-related nuclear cataract, right eye: Secondary | ICD-10-CM | POA: Diagnosis not present

## 2019-04-09 DIAGNOSIS — H2512 Age-related nuclear cataract, left eye: Secondary | ICD-10-CM | POA: Diagnosis not present

## 2019-04-16 DIAGNOSIS — H25011 Cortical age-related cataract, right eye: Secondary | ICD-10-CM | POA: Diagnosis not present

## 2019-04-16 DIAGNOSIS — H5703 Miosis: Secondary | ICD-10-CM | POA: Diagnosis not present

## 2019-04-16 DIAGNOSIS — H2511 Age-related nuclear cataract, right eye: Secondary | ICD-10-CM | POA: Diagnosis not present

## 2019-04-16 DIAGNOSIS — H21561 Pupillary abnormality, right eye: Secondary | ICD-10-CM | POA: Diagnosis not present

## 2019-04-18 ENCOUNTER — Encounter: Payer: Self-pay | Admitting: Family Medicine

## 2019-05-04 ENCOUNTER — Ambulatory Visit: Payer: Medicare HMO

## 2019-05-08 ENCOUNTER — Ambulatory Visit: Payer: Medicare HMO | Attending: Internal Medicine

## 2019-05-08 DIAGNOSIS — Z23 Encounter for immunization: Secondary | ICD-10-CM

## 2019-05-08 NOTE — Progress Notes (Signed)
   Covid-19 Vaccination Clinic  Name:  Juan [name]    MRN: [id number] DOB: 1940/10/07  05/08/2019  [name] was observed post Covid-19 immunization for 15 minutes without incidence. He was provided with Vaccine Information Sheet and instruction to access the V-Safe system.   [name] was instructed to call 911 with any severe reactions post vaccine: Marland Kitchen Difficulty breathing  . Swelling of your face and throat  . A fast heartbeat  . A bad rash all over your body  . Dizziness and weakness    Immunizations Administered    Name Date Dose VIS Date Route   Pfizer COVID-19 Vaccine 05/08/2019 10:33 AM 0.3 mL 02/21/2019 Intramuscular   Manufacturer: [place]   Lot: [id number]   [place]: [id number]

## 2019-05-12 ENCOUNTER — Other Ambulatory Visit: Payer: Self-pay | Admitting: Family Medicine

## 2019-05-12 ENCOUNTER — Other Ambulatory Visit: Payer: Self-pay

## 2019-05-12 ENCOUNTER — Other Ambulatory Visit: Payer: Medicare HMO

## 2019-05-12 DIAGNOSIS — N183 Chronic kidney disease, stage 3 unspecified: Secondary | ICD-10-CM

## 2019-05-12 DIAGNOSIS — E119 Type 2 diabetes mellitus without complications: Secondary | ICD-10-CM

## 2019-05-12 DIAGNOSIS — I10 Essential (primary) hypertension: Secondary | ICD-10-CM

## 2019-05-12 DIAGNOSIS — E7800 Pure hypercholesterolemia, unspecified: Secondary | ICD-10-CM

## 2019-05-13 LAB — COMPREHENSIVE METABOLIC PANEL
AG Ratio: 1.7 (calc) (ref 1.0–2.5)
ALT: 16 U/L (ref 9–46)
AST: 15 U/L (ref 10–35)
Albumin: 4.1 g/dL (ref 3.6–5.1)
Alkaline phosphatase (APISO): 63 U/L (ref 35–144)
BUN/Creatinine Ratio: 16 (calc) (ref 6–22)
BUN: 24 mg/dL (ref 7–25)
CO2: 21 mmol/L (ref 20–32)
Calcium: 9.4 mg/dL (ref 8.6–10.3)
Chloride: 104 mmol/L (ref 98–110)
Creat: 1.52 mg/dL — ABNORMAL HIGH (ref 0.70–1.11)
Globulin: 2.4 g/dL (calc) (ref 1.9–3.7)
Glucose, Bld: 173 mg/dL — ABNORMAL HIGH (ref 65–99)
Potassium: 4.4 mmol/L (ref 3.5–5.3)
Sodium: 136 mmol/L (ref 135–146)
Total Bilirubin: 0.4 mg/dL (ref 0.2–1.2)
Total Protein: 6.5 g/dL (ref 6.1–8.1)

## 2019-05-13 LAB — HEMOGLOBIN A1C
Hgb A1c MFr Bld: 7.2 % of total Hgb — ABNORMAL HIGH (ref <5.7)
Mean Plasma Glucose: 160 (calc)
eAG (mmol/L): 8.9 (calc)

## 2019-05-13 LAB — MICROALBUMIN / CREATININE URINE RATIO
Creatinine, Urine: 120 mg/dL (ref 20–320)
Microalb Creat Ratio: 210 mcg/mg creat — ABNORMAL HIGH (ref <30)
Microalb, Ur: 25.2 mg/dL

## 2019-05-13 LAB — CBC WITH DIFFERENTIAL/PLATELET
Absolute Monocytes: 573 cells/uL (ref 200–950)
Basophils Absolute: 38 cells/uL (ref 0–200)
Basophils Relative: 0.6 %
Eosinophils Absolute: 170 cells/uL (ref 15–500)
Eosinophils Relative: 2.7 %
HCT: 36.2 % — ABNORMAL LOW (ref 38.5–50.0)
Hemoglobin: 12.2 g/dL — ABNORMAL LOW (ref 13.2–17.1)
Lymphs Abs: 1556 cells/uL (ref 850–3900)
MCH: 31.5 pg (ref 27.0–33.0)
MCHC: 33.7 g/dL (ref 32.0–36.0)
MCV: 93.5 fL (ref 80.0–100.0)
MPV: 10.1 fL (ref 7.5–12.5)
Monocytes Relative: 9.1 %
Neutro Abs: 3963 cells/uL (ref 1500–7800)
Neutrophils Relative %: 62.9 %
Platelets: 138 10*3/uL — ABNORMAL LOW (ref 140–400)
RBC: 3.87 10*6/uL — ABNORMAL LOW (ref 4.20–5.80)
RDW: 13.2 % (ref 11.0–15.0)
Total Lymphocyte: 24.7 %
WBC: 6.3 10*3/uL (ref 3.8–10.8)

## 2019-05-13 LAB — LIPID PANEL
Cholesterol: 138 mg/dL (ref <200)
HDL: 39 mg/dL — ABNORMAL LOW (ref >=40)
LDL Cholesterol (Calc): 73 mg/dL (calc)
Non-HDL Cholesterol (Calc): 99 mg/dL (calc) (ref <130)
Total CHOL/HDL Ratio: 3.5 (calc) (ref <5.0)
Triglycerides: 192 mg/dL — ABNORMAL HIGH (ref <150)

## 2019-05-15 ENCOUNTER — Other Ambulatory Visit: Payer: Self-pay

## 2019-05-15 ENCOUNTER — Encounter: Payer: Self-pay | Admitting: Family Medicine

## 2019-05-15 ENCOUNTER — Ambulatory Visit (INDEPENDENT_AMBULATORY_CARE_PROVIDER_SITE_OTHER): Payer: Medicare HMO | Admitting: Family Medicine

## 2019-05-15 VITALS — BP 164/70 | HR 100 | Temp 96.7°F | Resp 18 | Ht 70.5 in | Wt 246.0 lb

## 2019-05-15 DIAGNOSIS — I6529 Occlusion and stenosis of unspecified carotid artery: Secondary | ICD-10-CM

## 2019-05-15 DIAGNOSIS — E1122 Type 2 diabetes mellitus with diabetic chronic kidney disease: Secondary | ICD-10-CM | POA: Diagnosis not present

## 2019-05-15 DIAGNOSIS — I10 Essential (primary) hypertension: Secondary | ICD-10-CM

## 2019-05-15 DIAGNOSIS — E119 Type 2 diabetes mellitus without complications: Secondary | ICD-10-CM | POA: Diagnosis not present

## 2019-05-15 DIAGNOSIS — H53012 Deprivation amblyopia, left eye: Secondary | ICD-10-CM | POA: Diagnosis not present

## 2019-05-15 DIAGNOSIS — I714 Abdominal aortic aneurysm, without rupture, unspecified: Secondary | ICD-10-CM

## 2019-05-15 DIAGNOSIS — H4720 Unspecified optic atrophy: Secondary | ICD-10-CM | POA: Diagnosis not present

## 2019-05-15 DIAGNOSIS — H43811 Vitreous degeneration, right eye: Secondary | ICD-10-CM | POA: Diagnosis not present

## 2019-05-15 DIAGNOSIS — I739 Peripheral vascular disease, unspecified: Secondary | ICD-10-CM

## 2019-05-15 DIAGNOSIS — E1151 Type 2 diabetes mellitus with diabetic peripheral angiopathy without gangrene: Secondary | ICD-10-CM | POA: Diagnosis not present

## 2019-05-15 DIAGNOSIS — H35353 Cystoid macular degeneration, bilateral: Secondary | ICD-10-CM | POA: Diagnosis not present

## 2019-05-15 DIAGNOSIS — H43822 Vitreomacular adhesion, left eye: Secondary | ICD-10-CM | POA: Diagnosis not present

## 2019-05-15 DIAGNOSIS — E7800 Pure hypercholesterolemia, unspecified: Secondary | ICD-10-CM | POA: Diagnosis not present

## 2019-05-15 DIAGNOSIS — N183 Chronic kidney disease, stage 3 unspecified: Secondary | ICD-10-CM

## 2019-05-15 MED ORDER — METOPROLOL SUCCINATE ER 25 MG PO TB24: 25.0000 mg | ORAL_TABLET | Freq: Every day | ORAL | 3 refills | Status: DC

## 2019-05-15 MED ORDER — SITAGLIPTIN PHOSPHATE 100 MG PO TABS: 100.0000 mg | ORAL_TABLET | Freq: Every day | ORAL | 1 refills | Status: DC

## 2019-05-15 NOTE — Progress Notes (Signed)
Subjective:    Patient ID: Juan [name], male    DOB: 12-31-1940, 79 y.o.   MRN: [id number]  Medication Refill  Patient is a very pleasant 79 year old Caucasian male here today for a checkup.  His blood pressure today is elevated at 164/70.  He states that his blood pressure at home is typically between 016 and 010 systolic.  He has a AAA.  Although this is been stable, certainly hypertension increases the risk for growth and rupture.  We discussed this at length today.  We have held hydrochlorothiazide due to his gout.  He is not on a beta-blocker however his heart rate today is in the 80s.  He denies any chest pain shortness of breath or dyspnea on exertion.  He has been eating more recently with the Covid pandemic and quarantine.  He believes this is the reason his blood sugar has been higher and also the reason why his blood pressure is higher.  He denies any polyuria, polydipsia, or blurry vision.  He denies any myalgias or right upper quadrant pain.  His most recent lab work is listed below.  Appointment on 05/12/2019  Component Date Value Ref Range Status  . Creatinine, Urine 05/12/2019 120  20 - 320 mg/dL Final  . Microalb, Ur 05/12/2019 25.2  mg/dL Final   Comment: Reference Range Not established   . Microalb Creat Ratio 05/12/2019 210* <30 mcg/mg creat Final   Comment: . The ADA defines abnormalities in albumin excretion as follows: Marland Kitchen Category         Result (mcg/mg creatinine) . Normal                    <30 Microalbuminuria         30-299  Clinical albuminuria   > OR = 300 . The ADA recommends that at least two of three specimens collected within a 3-6 month period be abnormal before considering a patient to be within a diagnostic category.   . Hgb A1c MFr Bld 05/12/2019 7.2* <5.7 % of total Hgb Final   Comment: For someone without known diabetes, a hemoglobin A1c value of 6.5% or greater indicates that they may have  diabetes and this should be confirmed with a  follow-up  test. . For someone with known diabetes, a value <7% indicates  that their diabetes is well controlled and a value  greater than or equal to 7% indicates suboptimal  control. A1c targets should be individualized based on  duration of diabetes, age, comorbid conditions, and  other considerations. . Currently, no consensus exists regarding use of hemoglobin A1c for diagnosis of diabetes for children. .   . Mean Plasma Glucose 05/12/2019 160  (calc) Final  . eAG (mmol/L) 05/12/2019 8.9  (calc) Final  . Cholesterol 05/12/2019 138  <200 mg/dL Final  . HDL 05/12/2019 39* > OR = 40 mg/dL Final  . Triglycerides 05/12/2019 192* <150 mg/dL Final  . LDL Cholesterol (Calc) 05/12/2019 73  mg/dL (calc) Final   Comment: Reference range: <100 . Desirable range <100 mg/dL for primary prevention;   <70 mg/dL for patients with CHD or diabetic patients  with > or = 2 CHD risk factors. Marland Kitchen LDL-C is now calculated using the Martin-Hopkins  calculation, which is a validated novel method providing  better accuracy than the Friedewald equation in the  estimation of LDL-C.  Cresenciano Genre et al. Annamaria Helling. 9323;557(32): 2061-2068  (http://education.[facility].com/faq/FAQ164)   . Total CHOL/HDL Ratio 05/12/2019 3.5  <5.0 (calc) Final  .  Non-HDL Cholesterol (Calc) 05/12/2019 99  <130 mg/dL (calc) Final   Comment: For patients with diabetes plus 1 major ASCVD risk  factor, treating to a non-HDL-C goal of <100 mg/dL  (LDL-C of <70 mg/dL) is considered a therapeutic  option.   . WBC 05/12/2019 6.3  3.8 - 10.8 Thousand/uL Final  . RBC 05/12/2019 3.87* 4.20 - 5.80 Million/uL Final  . Hemoglobin 05/12/2019 12.2* 13.2 - 17.1 g/dL Final  . HCT 05/12/2019 36.2* 38.5 - 50.0 % Final  . MCV 05/12/2019 93.5  80.0 - 100.0 fL Final  . MCH 05/12/2019 31.5  27.0 - 33.0 pg Final  . MCHC 05/12/2019 33.7  32.0 - 36.0 g/dL Final  . RDW 05/12/2019 13.2  11.0 - 15.0 % Final  . Platelets 05/12/2019 138* 140 - 400  Thousand/uL Final  . MPV 05/12/2019 10.1  7.5 - 12.5 fL Final  . Neutro Abs 05/12/2019 3,963  1,500 - 7,800 cells/uL Final  . Lymphs Abs 05/12/2019 1,556  850 - 3,900 cells/uL Final  . Absolute Monocytes 05/12/2019 573  200 - 950 cells/uL Final  . Eosinophils Absolute 05/12/2019 170  15 - 500 cells/uL Final  . Basophils Absolute 05/12/2019 38  0 - 200 cells/uL Final  . Neutrophils Relative % 05/12/2019 62.9  % Final  . Total Lymphocyte 05/12/2019 24.7  % Final  . Monocytes Relative 05/12/2019 9.1  % Final  . Eosinophils Relative 05/12/2019 2.7  % Final  . Basophils Relative 05/12/2019 0.6  % Final  . Glucose, Bld 05/12/2019 173* 65 - 99 mg/dL Final   Comment: .            Fasting reference interval . For someone without known diabetes, a glucose value >125 mg/dL indicates that they may have diabetes and this should be confirmed with a follow-up test. .   . BUN 05/12/2019 24  7 - 25 mg/dL Final  . Creat 05/12/2019 1.52* 0.70 - 1.11 mg/dL Final   Comment: For patients >1 years of age, the reference limit for Creatinine is approximately 13% higher for people identified as African-American. .   Havery Moros Ratio 05/12/2019 16  6 - 22 (calc) Final  . Sodium 05/12/2019 136  135 - 146 mmol/L Final  . Potassium 05/12/2019 4.4  3.5 - 5.3 mmol/L Final  . Chloride 05/12/2019 104  98 - 110 mmol/L Final  . CO2 05/12/2019 21  20 - 32 mmol/L Final  . Calcium 05/12/2019 9.4  8.6 - 10.3 mg/dL Final  . Total Protein 05/12/2019 6.5  6.1 - 8.1 g/dL Final  . Albumin 05/12/2019 4.1  3.6 - 5.1 g/dL Final  . Globulin 05/12/2019 2.4  1.9 - 3.7 g/dL (calc) Final  . AG Ratio 05/12/2019 1.7  1.0 - 2.5 (calc) Final  . Total Bilirubin 05/12/2019 0.4  0.2 - 1.2 mg/dL Final  . Alkaline phosphatase (APISO) 05/12/2019 63  35 - 144 U/L Final  . AST 05/12/2019 15  10 - 35 U/L Final  . ALT 05/12/2019 16  9 - 46 U/L Final    Past Medical History:  Diagnosis Date  . AAA (abdominal aortic aneurysm)  ([place]) 8/12   3.5cm  . CAD (coronary artery disease)   . CKD (chronic kidney disease) stage 3, GFR 30-59 ml/min   . Colon polyps   . Diabetes mellitus without complication ([place])   . 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 Hyperlipidemia   . Hypertension   . 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 Neuromuscular disorder ([place])    L2-3,L5-S1 bulging disc /nerve impingement  . PAD (peripheral artery disease) ([place])    Past Surgical History:  Procedure Laterality Date  . COLONOSCOPY N/A 09/27/2012   Procedure: COLONOSCOPY;  Surgeon: Daneil Dolin, MD;  Location: [facility];  Service: Endoscopy;  Laterality: N/A;  9:30  . CORONARY ANGIOPLASTY     1989  . TONSILLECTOMY     Current Outpatient Medications on File Prior to Visit  Medication Sig Dispense Refill  . allopurinol (ZYLOPRIM) 300 MG tablet TAKE 1 TABLET EVERY DAY 90 tablet 3  . aspirin EC 81 MG tablet Take 81 mg by mouth daily.    Marland Kitchen azithromycin (ZITHROMAX) 250 MG tablet Take 2 tablets  1 day, then 1 tablet daily x 4 days 6 tablet 0  . benzonatate (TESSALON) 100 MG capsule Take 1 capsule (100 mg total) by mouth 3 (three) times daily as needed for cough. 20 capsule 0  . Blood Glucose Monitoring Suppl (TRUE METRIX METER) w/Device KIT Use as Directed 1 kit 1  . diltiazem (CARDIZEM CD) 240 MG 24 hr capsule TAKE 1 CAPSULE EVERY DAY 90 capsule 3  . doxazosin (CARDURA) 4 MG tablet TAKE 1 TABLET EVERY DAY 90 tablet 3  . glipiZIDE (GLUCOTROL) 5 MG tablet TAKE 1 TABLET TWO TIMES DAILY BEFORE  MEALS 180 tablet 2  . glucose blood (TRUE METRIX BLOOD GLUCOSE TEST) test strip Check BS QD - dx: e11.9 100 each 12  . HYDROcodone-homatropine (HYCODAN) 5-1.5 MG/5ML syrup Take 5 mLs by mouth every 8 (eight) hours as needed for cough. 120 mL 0  . JANUVIA 100 MG tablet TAKE 1 TABLET EVERY DAY 90 tablet 1  . Lancet Devices (PRODIGY LANCING DEVICE) MISC Use bid DX e11.9 1 each 2  . lisinopril  (ZESTRIL) 40 MG tablet TAKE 1 TABLET EVERY DAY 90 tablet 3  . metFORMIN (GLUCOPHAGE) 500 MG tablet TAKE 1 TABLET TWICE DAILY WITH MEALS (NEED MD APPOINTMENT) 180 tablet 3  . Multiple Vitamin (MULTIVITAMIN WITH MINERALS) TABS Take 1 tablet by mouth daily.    . Omega-3 Fatty Acids (FISH OIL PO) Take by mouth.    . rosuvastatin (CRESTOR) 20 MG tablet TAKE 1 TABLET (20 MG TOTAL) BY MOUTH DAILY. 90 tablet 3  . TRUEPLUS LANCETS 33G MISC Check BS QD - dx: e11.9 100 each 3   No current facility-administered medications on file prior to visit.   Allergies  Allergen Reactions  . Jardiance [Empagliflozin]     Severe nausea-    Social History   Socioeconomic History  . Marital status: Married    Spouse name: Not on file  . Number of children: Not on file  . Years of education: Not on file  . Highest education level: Not on file  Occupational History  . Occupation: retired    Fish farm manager: RETIRED    Comment: [employer]   Tobacco Use  . Smoking status: Former Smoker    Years: 35.00    Types: Cigarettes    Quit date: 01/02/1988    Years since quitting: 31.3  . Smokeless tobacco: Never Used  Substance and Sexual Activity  . Alcohol use: Not Currently    Comment: rare beer, no history of ETOH abuse  . Drug use: No  . Sexual activity: Not on file  Other Topics Concern  . Not on file  Social History Narrative  . Not on file   Social Determinants of  Health   Financial Resource Strain:   . Difficulty of Paying Living Expenses: Not on file  Food Insecurity:   . Worried About Charity fundraiser in the Last Year: Not on file  . Ran Out of Food in the Last Year: Not on file  Transportation Needs:   . Lack of Transportation (Medical): Not on file  . Lack of Transportation (Non-Medical): Not on file  Physical Activity:   . Days of Exercise per Week: Not on file  . Minutes of Exercise per Session: Not on file  Stress:   . Feeling of Stress : Not on file  Social Connections:   . Frequency  of Communication with Friends and Family: Not on file  . Frequency of Social Gatherings with Friends and Family: Not on file  . Attends Religious Services: Not on file  . Active Member of Clubs or Organizations: Not on file  . Attends Archivist Meetings: Not on file  . Marital Status: Not on file  Intimate Partner Violence:   . Fear of Current or Ex-Partner: Not on file  . Emotionally Abused: Not on file  . Physically Abused: Not on file  . Sexually Abused: Not on file   Family History  Problem Relation Age of Onset  . Diabetes Mother   . Heart disease Mother        After age 81  . Heart attack Mother   . Hypertension Mother   . Diabetes Father   . Heart disease Father        After age 23  . Hypertension Father   . Heart attack Father   . Brain cancer Sister   . Cancer Sister        Brain  . Diabetes Sister   . Hypertension Sister   . Cancer Brother        Chest Tumor  . Diabetes Brother   . Hypertension Brother   . Deep vein thrombosis Sister   . Diabetes Sister   . Diabetes Brother        Bilateral leg  . Colon cancer Neg Hx       Review of Systems  All other systems reviewed and are negative.      Objective:   Physical Exam  Constitutional: He is oriented to person, place, and time. He appears well-developed and well-nourished. No distress.  HENT:  Head: Normocephalic and atraumatic.  Right Ear: External ear normal.  Left Ear: External ear normal.  Nose: Nose normal.  Mouth/Throat: Oropharynx is clear and moist. No oropharyngeal exudate.  Eyes: Pupils are equal, round, and reactive to light. Conjunctivae are normal. Right eye exhibits no discharge. Left eye exhibits no discharge. No scleral icterus.  Neck: No JVD present. No tracheal deviation present. No thyromegaly present.  Cardiovascular: Normal rate, regular rhythm, normal heart sounds and intact distal pulses. Exam reveals no gallop and no friction rub.  No murmur heard. Pulmonary/Chest:  Effort normal and breath sounds normal. No stridor. No respiratory distress. He has no wheezes. He has no rales. He exhibits no tenderness.  Abdominal: Soft. Bowel sounds are normal. He exhibits no distension and no mass. There is no abdominal tenderness. There is no rebound and no guarding.  Musculoskeletal:        General: No tenderness or edema. Normal range of motion.     Cervical back: Normal range of motion and neck supple.  Lymphadenopathy:    He has no cervical adenopathy.  Neurological: He is alert  and oriented to person, place, and time. He has normal reflexes. No cranial nerve deficit. He exhibits normal muscle tone. Coordination normal.  Skin: Skin is warm. No rash noted. He is not diaphoretic. No erythema. No pallor.  Psychiatric: He has a normal mood and affect. His behavior is normal. Judgment and thought content normal.  Vitals reviewed.         Assessment & Plan:     Controlled type 2 diabetes mellitus without complication, without long-term current use of insulin (HCC)  Stage 3 chronic kidney disease, unspecified whether stage 3a or 3b CKD  Benign essential HTN  Pure hypercholesterolemia  Stenosis of carotid artery, unspecified laterality  Abdominal aortic aneurysm (AAA) without rupture (HCC)  PAD (peripheral artery disease) ([place])  My biggest concern is his blood pressure given his history of a AAA.  I will add metoprolol 25 mg daily and recommended the patient recheck his blood pressure in 2 to 3 weeks.  His renal function is stable showing no progression of his stage III kidney disease.  His LDL cholesterol is acceptable and well below 100.  His AAA has been stable however I will try to keep his systolic blood pressure less than 140 and I would also like to have the patient on the beta-blocker.  He denies any claudication or symptoms of depression of his peripheral artery disease.  He denies any angina or symptoms of stroke.  His hemoglobin A1c is 7.2.  I have  recommended 5 to 10 pounds of weight loss to help get his A1c to goal.  Recheck blood pressure in 3 weeks

## 2019-05-21 DIAGNOSIS — H35353 Cystoid macular degeneration, bilateral: Secondary | ICD-10-CM | POA: Diagnosis not present

## 2019-05-21 DIAGNOSIS — H43822 Vitreomacular adhesion, left eye: Secondary | ICD-10-CM | POA: Diagnosis not present

## 2019-05-21 DIAGNOSIS — E119 Type 2 diabetes mellitus without complications: Secondary | ICD-10-CM | POA: Diagnosis not present

## 2019-05-21 DIAGNOSIS — H35351 Cystoid macular degeneration, right eye: Secondary | ICD-10-CM | POA: Diagnosis not present

## 2019-05-21 DIAGNOSIS — H18231 Secondary corneal edema, right eye: Secondary | ICD-10-CM | POA: Diagnosis not present

## 2019-06-03 ENCOUNTER — Ambulatory Visit: Payer: Medicare HMO | Attending: Internal Medicine

## 2019-06-03 DIAGNOSIS — Z23 Encounter for immunization: Secondary | ICD-10-CM

## 2019-06-03 NOTE — Progress Notes (Signed)
   Covid-19 Vaccination Clinic  Name:  Juan [name]    MRN: [id number] DOB: 04-Mar-1941  06/03/2019  [name] was observed post Covid-19 immunization for 15 minutes without incident. He was provided with Vaccine Information Sheet and instruction to access the V-Safe system.   [name] was instructed to call 911 with any severe reactions post vaccine: Marland Kitchen Difficulty breathing  . Swelling of face and throat  . A fast heartbeat  . A bad rash all over body  . Dizziness and weakness   Immunizations Administered    Name Date Dose VIS Date Route   Pfizer COVID-19 Vaccine 06/03/2019 10:06 AM 0.3 mL 02/21/2019 Intramuscular   Manufacturer: [place]   Lot: [id number]   [place]: [id number]

## 2019-06-09 DIAGNOSIS — H35353 Cystoid macular degeneration, bilateral: Secondary | ICD-10-CM | POA: Diagnosis not present

## 2019-06-09 DIAGNOSIS — H43822 Vitreomacular adhesion, left eye: Secondary | ICD-10-CM | POA: Diagnosis not present

## 2019-06-09 DIAGNOSIS — H35352 Cystoid macular degeneration, left eye: Secondary | ICD-10-CM | POA: Diagnosis not present

## 2019-06-09 DIAGNOSIS — H35351 Cystoid macular degeneration, right eye: Secondary | ICD-10-CM | POA: Diagnosis not present

## 2019-06-09 DIAGNOSIS — H18231 Secondary corneal edema, right eye: Secondary | ICD-10-CM | POA: Diagnosis not present

## 2019-06-09 DIAGNOSIS — H4302 Vitreous prolapse, left eye: Secondary | ICD-10-CM | POA: Diagnosis not present

## 2019-06-11 ENCOUNTER — Other Ambulatory Visit: Payer: Self-pay

## 2019-06-11 ENCOUNTER — Ambulatory Visit: Payer: Medicare HMO | Admitting: Student

## 2019-06-11 ENCOUNTER — Encounter: Payer: Self-pay | Admitting: Student

## 2019-06-11 VITALS — BP 120/72 | HR 68 | Temp 98.3°F | Ht 70.5 in | Wt 250.6 lb

## 2019-06-11 DIAGNOSIS — E785 Hyperlipidemia, unspecified: Secondary | ICD-10-CM | POA: Diagnosis not present

## 2019-06-11 DIAGNOSIS — I714 Abdominal aortic aneurysm, without rupture, unspecified: Secondary | ICD-10-CM

## 2019-06-11 DIAGNOSIS — I6521 Occlusion and stenosis of right carotid artery: Secondary | ICD-10-CM

## 2019-06-11 DIAGNOSIS — I10 Essential (primary) hypertension: Secondary | ICD-10-CM | POA: Diagnosis not present

## 2019-06-11 DIAGNOSIS — I2510 Atherosclerotic heart disease of native coronary artery without angina pectoris: Secondary | ICD-10-CM

## 2019-06-11 MED ORDER — PANTOPRAZOLE SODIUM 20 MG PO TBEC: 20.0000 mg | DELAYED_RELEASE_TABLET | Freq: Every day | ORAL | 5 refills | Status: DC

## 2019-06-11 NOTE — Progress Notes (Signed)
Cardiology Office Note    Date:  06/11/2019   ID:  Juan [name], DOB 08-06-1940, MRN [id number]  PCP:  Susy Frizzle, MD  Cardiologist: Kate Sable, MD    Chief Complaint  Patient presents with   Follow-up    6 month visit    History of Present Illness:    Juan [name] is a 79 y.o. male with past medical history of CAD (s/p remote angioplasty in 1989, low-risk NST in 01/2018), HTN, HLD, Type 2 DM and AAA (at 4.5cm by imaging in 11/2018) who presents to the office today for 94-monthfollow-up.  He was last examined by Dr. KBronson Ingin 11/2018 and reported having worsening fatigue but denied any specific respiratory changes or associated chest pain. He was continued on ASA 81 mg daily and Crestor 20 mg daily but Toprol-XL 50 mg daily was discontinued to see if this would help with his fatigue and Imdur was also discontinued given reported headaches.  He did follow-up with his PCP earlier this month and given his elevated BP at that time, he was restarted on Toprol-XL 255mdaily.   In talking with the patient and his wife today, he reports overall doing well from a cardiac perspective since his last visit. He denies any recent exertional chest pain or dyspnea on exertion. He does describe an occasional burning sensation along his sternal region which he believes is most consistent with acid reflux as this occurs when consuming spicy or acidic foods and typically resolves with Pepcid. He denies any recent orthopnea, PND, lower extremity edema or palpitations.  He has tolerated Toprol-XL well since restarting the medication. He believes his prior side-effects of fatigue were secondary to Jardiance.    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   Hyperlipidemia    Hypertension    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    50-69% (12/2015)   Neuromuscular disorder ([facility]   L2-3,L5-S1 bulging disc /nerve impingement   PAD (peripheral artery disease) ([facility]    Past Surgical History:  Procedure Laterality Date   COLONOSCOPY N/A 09/27/2012   Procedure: COLONOSCOPY;  Surgeon: RoDaneil DolinMD;  Location: [facility];  Service: Endoscopy;  Laterality: N/A;  9:30   CORONARY ANGIOPLASTY     1989   TONSILLECTOMY      Current Medications: Outpatient Medications Prior to Visit  Medication Sig Dispense Refill   allopurinol (ZYLOPRIM) 300 MG tablet TAKE 1 TABLET EVERY DAY 90 tablet 3   aspirin EC 81 MG tablet Take 81 mg by mouth daily.     Blood Glucose Monitoring Suppl (TRUE METRIX METER) w/Device KIT Use as Directed 1 kit 1   diltiazem (CARDIZEM CD) 240 MG 24 hr capsule TAKE 1 CAPSULE EVERY DAY 90 capsule 3   doxazosin (CARDURA) 4 MG tablet TAKE 1 TABLET EVERY DAY 90 tablet 3   glipiZIDE (GLUCOTROL) 5 MG tablet TAKE 1 TABLET TWO TIMES DAILY BEFORE  MEALS 180 tablet 2   glucose blood (TRUE METRIX BLOOD GLUCOSE TEST) test strip Check BS QD - dx: e11.9 100 each 12   Lancet Devices (PRODIGY LANCING DEVICE) MISC Use bid DX e11.9 1 each  2   lisinopril (ZESTRIL) 40 MG tablet TAKE 1 TABLET EVERY DAY 90 tablet 3   metoprolol succinate (TOPROL-XL) 25 MG 24 hr tablet Take 1 tablet (25 mg total) by mouth daily. 90 tablet 3   Multiple Vitamin (MULTIVITAMIN WITH MINERALS) TABS Take 1 tablet by mouth daily.     Omega-3 Fatty Acids (FISH OIL PO) Take by mouth.     rosuvastatin (CRESTOR) 20 MG tablet TAKE 1 TABLET (20 MG TOTAL) BY MOUTH DAILY. 90 tablet 3   sitaGLIPtin (JANUVIA) 100 MG tablet Take 1 tablet (100 mg total) by mouth daily. 90 tablet 1   TRUEPLUS LANCETS 33G MISC Check BS QD - dx: e11.9 100 each 3   metFORMIN (GLUCOPHAGE) 500 MG tablet TAKE 1  TABLET TWICE DAILY WITH MEALS (NEED MD APPOINTMENT) 180 tablet 3   No facility-administered medications prior to visit.     Allergies:   Jardiance [empagliflozin]   Social History   Socioeconomic History   Marital status: Married    Spouse name: Not on file   Number of children: Not on file   Years of education: Not on file   Highest education level: Not on file  Occupational History   Occupation: retired    Fish farm manager: RETIRED    Comment: [employer]   Tobacco Use   Smoking status: Former Smoker    Years: 35.00    Types: Cigarettes    Quit date: 01/02/1988    Years since quitting: 31.4   Smokeless tobacco: Never Used  Substance and Sexual Activity   Alcohol use: Not Currently    Comment: rare beer, no history of ETOH abuse   Drug use: No   Sexual activity: Not on file  Other Topics Concern   Not on file  Social History Narrative   Not on file   Social Determinants of Health   Financial Resource Strain:    Difficulty of Paying Living Expenses:   Food Insecurity:    Worried About Charity fundraiser in the Last Year:    Arboriculturist in the Last Year:   Transportation Needs:    Film/video editor (Medical):    Lack of Transportation (Non-Medical):   Physical Activity:    Days of Exercise per Week:    Minutes of Exercise per Session:   Stress:    Feeling of Stress :   Social Connections:    Frequency of Communication with Friends and Family:    Frequency of Social Gatherings with Friends and Family:    Attends Religious Services:    Active Member of Clubs or Organizations:    Attends Music therapist:    Marital Status:      Family History:  The patient's family history includes Brain cancer in his sister; Cancer in his brother and sister; Deep vein thrombosis in his sister; Diabetes in his brother, brother, father, mother, sister, and sister; Heart attack in his father and mother; Heart disease in his father and  mother; Hypertension in his brother, father, mother, and sister.   Review of Systems:   Please see the history of present illness.     General:  No chills, fever, night sweats or weight changes.  Cardiovascular:  No chest pain, dyspnea on exertion, edema, orthopnea, palpitations, paroxysmal nocturnal dyspnea. Dermatological: No rash, lesions/masses Respiratory: No cough, dyspnea Urologic: No hematuria, dysuria Abdominal:   No nausea, vomiting, diarrhea, bright red blood per rectum, melena, or hematemesis Neurologic:  No visual changes, wkns, changes in  mental status.   All other systems reviewed and are otherwise negative except as noted above.   Physical Exam:    VS:  BP 120/72    Pulse 68    Temp 98.3 F (36.8 C)    Ht 5' 10.5" (1.791 m)    Wt 250 lb 9.6 oz (113.7 kg)    SpO2 99%    BMI 35.45 kg/m    General: Well developed, well nourished,male appearing in no acute distress. Head: Normocephalic, atraumatic, sclera non-icteric.  Neck: No carotid bruits. JVD not elevated.  Lungs: Respirations regular and unlabored, without wheezes or rales.  Heart: Regular rate and rhythm. No S3 or S4.  No murmur, no rubs, or gallops appreciated. Abdomen: Soft, non-tender, non-distended. No obvious abdominal masses. Msk:  Strength and tone appear normal for age. No obvious joint deformities or effusions. Extremities: No clubbing or cyanosis. No lower extremity edema.  Distal pedal pulses are 2+ bilaterally. Neuro: Alert and oriented X 3. Moves all extremities spontaneously. No focal deficits noted. Psych:  Responds to questions appropriately with a normal affect. Skin: No rashes or lesions noted  Wt Readings from Last 3 Encounters:  06/11/19 250 lb 9.6 oz (113.7 kg)  05/15/19 246 lb (111.6 kg)  11/14/18 241 lb (109.3 kg)     Studies/Labs Reviewed:   EKG:  EKG is ordered today. The ekg ordered today demonstrates NSR, HR 61 with 1st degree AV block. No acute ST abnormalities when compared to  prior tracings.   Recent Labs: 11/14/2018: TSH 3.56 05/12/2019: ALT 16; BUN 24; Creat 1.52; Hemoglobin 12.2; Platelets 138; Potassium 4.4; Sodium 136   Lipid Panel    Component Value Date/Time   CHOL 138 05/12/2019 0842   TRIG 192 (H) 05/12/2019 0842   HDL 39 (L) 05/12/2019 0842   CHOLHDL 3.5 05/12/2019 0842   VLDL 35 (H) 08/28/2016 0845   LDLCALC 73 05/12/2019 0842    Additional studies/ records that were reviewed today include:   NST: 01/2018  There was no ST segment deviation noted during stress.  The study is normal. No myocardial ischemia or scar.  This is a low risk study.  Nuclear stress EF: 71%.  Echocardiogram: 01/2018 Study Conclusions   - Procedure narrative: Contrast enhancement employed ([place]).  - Left ventricle: The cavity size was normal. There was mild  concentric hypertrophy. Systolic function was normal. The  estimated ejection fraction was in the range of 60% to 65%. Wall  motion was normal; there were no regional wall motion  abnormalities. Doppler parameters are consistent with abnormal  left ventricular relaxation (grade 1 diastolic dysfunction).  Doppler parameters are consistent with indeterminate ventricular  filling pressure.  - Aortic valve: Mildly to moderately calcified annulus. Trileaflet;  mildly thickened leaflets. There was no stenosis.  - Mitral valve: Mildly calcified annulus. Mildly thickened leaflets  .  - Left atrium: The atrium was mildly dilated.   AAA: 11/2018 Summary:  Abdominal Aorta: There is evidence of abnormal dilatation of the mid  Abdominal aorta. The largest aortic diameter has increased compared to  prior exam. Previous diameter measurement was 4.2 cm obtained on  08/02/2017.  Carotid Dopplers: 11/2018 IMPRESSION: Minor carotid atherosclerosis. No hemodynamically significant ICA stenosis. Degree of narrowing less than 50% bilaterally by ultrasound criteria.  Patent antegrade vertebral  flow bilaterally   Assessment:    1. Coronary artery disease involving native coronary artery of native heart without angina pectoris   2. Essential hypertension   3. Hyperlipidemia LDL goal <70  4. AAA (abdominal aortic aneurysm) without rupture ([place])   5. Internal carotid artery stenosis, right      Plan:   In order of problems listed above:  1. CAD - he is s/p remote angioplasty in 1989 with low-risk NST in 01/2018. He denies any recent exertional chest pain or dyspnea on exertion. Remains on ASA 81 mg daily, Toprol-XL 25 mg daily and Crestor 20 mg daily. - He does describe intermittent episodes of burning along his sternum which typically occurs with food consumption and resolves with Pepcid. This does seem most consistent with GERD and he wishes to try PPI therapy.  Will plan to initiate Protonix 20 mg daily.  2. HTN - BP is well controlled at 120/72 during today's visit. Continue Cardizem CD 240 mg daily, Cardura 4 mg daily, Lisinopril 40 mg daily and Toprol-XL 25 mg daily.  3. HLD - FLP earlier this month showed total cholesterol 138, HDL 39, triglycerides 192 and LDL 73. He remains on Crestor 20 mg daily.  4. AAA - at 4.5 cm by imaging in 11/2018. Followed by PCP with plans for repeat imaging in 6 months.   5. Carotid Artery Stenosis - Dopplers in 11/2018 showed less than 50% stenosis bilaterally. Continue ASA and statin therapy.   Medication Adjustments/Labs and Tests Ordered: Current medicines are reviewed at length with the patient today.  Concerns regarding medicines are outlined above.  Medication changes, Labs and Tests ordered today are listed in the Patient Instructions below. Patient Instructions  Medication Instructions:  Your physician recommends that you continue on your current medications as directed. Please refer to the Current Medication list given to you today.  Start Protonix  20 mg Daily  *If you need a refill on your cardiac medications before  your next appointment, please call your pharmacy*   Lab Work: NONE   If you have labs (blood work) drawn today and your tests are completely normal, you will receive your results only by:  [place] (if you have MyChart) OR  A paper copy in the mail If you have any lab test that is abnormal or we need to change your treatment, we will call you to review the results.   Testing/Procedures: NONE    Follow-Up: At [facility], you and your health needs are our priority.  As part of our continuing mission to provide you with exceptional heart care, we have created designated Provider Care Teams.  These Care Teams include your primary Cardiologist (physician) and Advanced Practice Providers (APPs -  Physician Assistants and Nurse Practitioners) who all work together to provide you with the care you need, when you need it.  We recommend signing up for the patient portal called "MyChart".  Sign up information is provided on this After Visit Summary.  MyChart is used to connect with patients for Virtual Visits (Telemedicine).  Patients are able to view lab/test results, encounter notes, upcoming appointments, etc.  Non-urgent messages can be sent to your provider as well.   To learn more about what you can do with MyChart, go to [email or web address].    Your next appointment:   1 year(s)  The format for your next appointment:   In Person  Provider:   Kate Sable, MD   Other Instructions Thank you for choosing [facility]!    Signed, Erma Heritage, PA-C  06/11/2019 5:19 PM    [facility] [address] [place], [place] [address] Phone: [phone] Fax: [phone]

## 2019-06-11 NOTE — Patient Instructions (Signed)
Medication Instructions:  Your physician recommends that you continue on your current medications as directed. Please refer to the Current Medication list given to you today.  Start Protonix  20 mg Daily  *If you need a refill on your cardiac medications before your next appointment, please call your pharmacy*   Lab Work: NONE   If you have labs (blood work) drawn today and your tests are completely normal, you will receive your results only by: [facility] MyChart Message (if you have MyChart) OR . A paper copy in the mail If you have any lab test that is abnormal or we need to change your treatment, we will call you to review the results.   Testing/Procedures: NONE    Follow-Up: At [facility], you and your health needs are our priority.  As part of our continuing mission to provide you with exceptional heart care, we have created designated Provider Care Teams.  These Care Teams include your primary Cardiologist (physician) and Advanced Practice Providers (APPs -  Physician Assistants and Nurse Practitioners) who all work together to provide you with the care you need, when you need it.  We recommend signing up for the patient portal called "MyChart".  Sign up information is provided on this After Visit Summary.  MyChart is used to connect with patients for Virtual Visits (Telemedicine).  Patients are able to view lab/test results, encounter notes, upcoming appointments, etc.  Non-urgent messages can be sent to your provider as well.   To learn more about what you can do with MyChart, go to [email or web address].    Your next appointment:   1 year(s)  The format for your next appointment:   In Person  Provider:   Kate Sable, MD   Other Instructions Thank you for choosing [facility]!

## 2019-06-17 ENCOUNTER — Encounter: Payer: Self-pay | Admitting: Family Medicine

## 2019-06-17 ENCOUNTER — Telehealth (INDEPENDENT_AMBULATORY_CARE_PROVIDER_SITE_OTHER): Payer: Self-pay

## 2019-06-17 NOTE — Telephone Encounter (Signed)
FYI: Called patient on 06/17/2019 to ask about sleep apnea appointment. Patient states that he will be emailing Dr. Dennard Schaumann today to schedule an evaluation.

## 2019-06-19 ENCOUNTER — Other Ambulatory Visit: Payer: Self-pay | Admitting: Family Medicine

## 2019-06-19 DIAGNOSIS — I10 Essential (primary) hypertension: Secondary | ICD-10-CM

## 2019-07-03 ENCOUNTER — Encounter: Payer: Self-pay | Admitting: Neurology

## 2019-07-03 ENCOUNTER — Other Ambulatory Visit: Payer: Self-pay

## 2019-07-03 ENCOUNTER — Ambulatory Visit: Payer: Medicare HMO | Admitting: Neurology

## 2019-07-03 VITALS — BP 118/62 | HR 70 | Temp 97.1°F | Ht 70.0 in | Wt 246.0 lb

## 2019-07-03 DIAGNOSIS — Z9861 Coronary angioplasty status: Secondary | ICD-10-CM | POA: Diagnosis not present

## 2019-07-03 DIAGNOSIS — I714 Abdominal aortic aneurysm, without rupture, unspecified: Secondary | ICD-10-CM

## 2019-07-03 DIAGNOSIS — I2510 Atherosclerotic heart disease of native coronary artery without angina pectoris: Secondary | ICD-10-CM | POA: Diagnosis not present

## 2019-07-03 DIAGNOSIS — N1832 Chronic kidney disease, stage 3b: Secondary | ICD-10-CM | POA: Diagnosis not present

## 2019-07-03 DIAGNOSIS — E13311 Other specified diabetes mellitus with unspecified diabetic retinopathy with macular edema: Secondary | ICD-10-CM

## 2019-07-03 DIAGNOSIS — I2583 Coronary atherosclerosis due to lipid rich plaque: Secondary | ICD-10-CM

## 2019-07-03 NOTE — Patient Instructions (Signed)

## 2019-07-03 NOTE — Progress Notes (Signed)
  SLEEP MEDICINE CLINIC    Provider:  Carmen  Dohmeier, MD  Primary Care Physician:  Pickard, Warren T, MD [address] [place] [address]     Referring Provider: Pickard, Warren T, Md [address] [place],  [place] [address]          Chief Complaint according to patient   Patient presents with:    . New Patient (Initial Visit)      Dr Gary Rankin, MD       HISTORY OF PRESENT ILLNESS:  Juan [name] is a 79 year old Caucasian male patient and seen here upon a referral on 07/03/2019.  Chief concern according to patient :  None   pt with wife, rm 10. pt presents today because Dr Rankin, MD felt like he needed to assess if OSA was a concern. states never had a SS. snores but unsure of apnea events. eye specialist was pushing for this because he had some swelling that was building up behind the eyes after cataract surgery. Dr Koneswaran.    I have the pleasure of seeing Schuyler Mogg 07/03/2019, a right handed Irish- German male with a possible sleep disorder.  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  Macular degeneration.    Sleep relevant medical history: Nocturia- 1-2.  Family medical /sleep history: no other family member on CPAP with OSA.  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 He lives in a household with a spouse- . Family status is adult children Pets are not present. Tobacco use- quit 32 years ago. ETOH use ;  rare ,Caffeine intake in form of Coffee(all day - and at night ) Soda( rare ) Tea ( rare ) . No  energy drinks. Regular exercise in form of walking      Sleep habits are as follows: The patient's dinner time is between 5 PM.  The patient goes to bed at midnight and no trouble to fall asleep, he continues to sleep for 2 hours, wakes for *several bathroom breaks, the first time at 2 AM.   The preferred sleep position is on either side, left preferred., with the support of 1 pillows.  Dreams are reportedly rare..  6.30  AM is the usual rise time. The patient wakes up spontaneously.  He reports not feeling refreshed or restored in AM, with symptoms such as dry mouth but no  morning headaches, just  residual fatigue.  Naps are taken frequently, " I snooze"  lasting from 15-40 minutes and are more refreshing than nocturnal sleep.    Review of Systems: Out of a complete 14 system review, the patient complains of only the following symptoms, and all other reviewed systems are negative.:  Fatigue, sleepiness , snoring, fragmented sleep- nocturia.   He is fully vaccinated - COVID 19   How likely are you to doze in the following situations: 0 = not likely, 1 = slight chance, 2 = moderate chance, 3 = high chance   Sitting and Reading? Watching Television? Sitting inactive in a   public place (theater or meeting)? As a passenger in a car for an hour without a break? Lying down in the afternoon when circumstances permit? Sitting and talking to someone? Sitting quietly after lunch without alcohol? In a car, while stopped for a few minutes in traffic?   Total = 4/ 24 points - with daily naps (!)  FSS endorsed at 63/ 63 points.    The pandemic has affected his social live. Isolated   Social History   Socioeconomic History  . Marital status: Married    Spouse name: Not on file  . Number of children: Not on file  . Years of education: Not on file  . Highest education level: Not on file   Occupational History  . Occupation: retired    Employer: RETIRED    Comment: [employer]   Tobacco Use  . Smoking status: Former Smoker    Years: 35.00    Types: Cigarettes    Quit date: 01/02/1988    Years since quitting: 31.5  . Smokeless tobacco: Never Used  Substance and Sexual Activity  . Alcohol use: Not Currently    Comment: rare beer, no history of ETOH abuse  . Drug use: No  . Sexual activity: Not on file  Other Topics Concern  . Not on file  Social History Narrative  . Not on file   Social Determinants of Health   Financial Resource Strain:   . Difficulty of Paying Living Expenses:   Food Insecurity:   . Worried About Running Out of Food in the Last Year:   . Ran Out of Food in the Last Year:   Transportation Needs:   . Lack of Transportation (Medical):   . Lack of Transportation (Non-Medical):   Physical Activity:   . Days of Exercise per Week:   . Minutes of Exercise per Session:   Stress:   . Feeling of Stress :   Social Connections:   . Frequency of Communication with Friends and Family:   . Frequency of Social Gatherings with Friends and Family:   . Attends Religious Services:   . Active Member of Clubs or Organizations:   . Attends Club or Organization Meetings:   . Marital Status:     Family History  Problem Relation Age of Onset  . Diabetes Mother   . Heart disease Mother        After age 60  . Heart attack Mother   . Hypertension Mother   . Diabetes Father   . Heart disease Father        After age 60  . Hypertension Father   . Heart attack Father   . Brain cancer Sister   . Cancer Sister        Brain  . Diabetes Sister   . Hypertension Sister   . Cancer Brother        Chest Tumor  . Diabetes Brother   . Hypertension Brother   . Deep vein thrombosis Sister   . Diabetes Sister   . Diabetes Brother        Bilateral leg  . Colon cancer Neg Hx     Past Medical History:  Diagnosis Date  . AAA (abdominal aortic aneurysm) (HCC)  8/12   3.5cm  . CAD (coronary artery disease)   . CKD (chronic kidney disease) stage 3, GFR 30-59 ml/min   . Colon polyps   . Diabetes mellitus without complication (HCC)   . 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   Hyperlipidemia   . Hypertension   . 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 Neuromuscular disorder ([place])    L2-3,L5-S1 bulging disc /nerve impingement  . PAD (peripheral artery disease) ([place])     Past Surgical History:  Procedure Laterality Date  . COLONOSCOPY N/A 09/27/2012   Procedure: COLONOSCOPY;  Surgeon: Daneil Dolin, MD;  [facility];  Service: Endoscopy;  Laterality: N/A;  9:30  . CORONARY ANGIOPLASTY     1989  . TONSILLECTOMY       Current Outpatient Medications on File Prior to Visit  Medication Sig Dispense Refill  . allopurinol (ZYLOPRIM) 300 MG tablet TAKE 1 TABLET EVERY DAY 90 tablet 3  . aspirin EC 81 MG tablet Take 81 mg by mouth daily.    . Blood Glucose Monitoring Suppl (TRUE METRIX METER) w/Device KIT Use as Directed 1 kit 1  . diltiazem (CARDIZEM CD) 240 MG 24 hr capsule TAKE 1 CAPSULE EVERY DAY 90 capsule 3  . doxazosin (CARDURA) 4 MG tablet TAKE 1 TABLET EVERY DAY 90 tablet 3  . glipiZIDE (GLUCOTROL) 5 MG tablet TAKE 1 TABLET TWO TIMES DAILY BEFORE  MEALS 180 tablet 2  . glucose blood (TRUE METRIX BLOOD GLUCOSE TEST) test strip Check BS QD - dx: e11.9 100 each 12  . Lancet Devices (PRODIGY LANCING DEVICE) MISC Use bid DX e11.9 1 each 2  . lisinopril (ZESTRIL) 40 MG tablet TAKE 1 TABLET EVERY DAY 90 tablet 3  . metoprolol succinate (TOPROL-XL) 25 MG 24 hr tablet Take 1 tablet (25 mg total) by mouth daily. 90 tablet 3  . Multiple Vitamin (MULTIVITAMIN WITH MINERALS) TABS Take 1 tablet by mouth daily.    . Omega-3 Fatty Acids (FISH OIL PO) Take by mouth.    . pantoprazole (PROTONIX) 20 MG tablet Take 1 tablet (20 mg total) by mouth daily. 30 tablet 5  . rosuvastatin (CRESTOR)  20 MG tablet TAKE 1 TABLET (20 MG TOTAL) BY MOUTH DAILY. 90 tablet 3  . sitaGLIPtin (JANUVIA) 100 MG tablet Take 1 tablet (100 mg total) by mouth daily. 90 tablet 1  . TRUEPLUS LANCETS 33G MISC Check BS QD - dx: e11.9 100 each 3   No current facility-administered medications on file prior to visit.    Allergies  Allergen Reactions  . Jardiance [Empagliflozin]     Severe nausea-     Physical exam:  Today's Vitals   07/03/19 1047  BP: 118/62  Pulse: 70  Temp: (!) 97.1 F (36.2 C)  Weight: 246 lb (111.6 kg)  Height: 5' 10" (1.778 m)   Body mass index is 35.3 kg/m.   Wt Readings from Last 3 Encounters:  07/03/19 246 lb (111.6 kg)  06/11/19 250 lb 9.6 oz (113.7 kg)  05/15/19 246 lb (111.6 kg)     Ht Readings from Last 3 Encounters:  07/03/19 5' 10" (1.778 m)  06/11/19 5' 10.5" (1.791 m)  05/15/19 5' 10.5" (1.791 m)      General: The patient is awake, alert and appears not in acute distress. The patient is well groomed. Head: Normocephalic, atraumatic. Neck is supple. Mallampati 3- only the top of the soft palate is visible.  Appears swollen- GERD ? neck circumference: 18.5 inches . Nasal airflow  patent.  Retrognathia is  seen.  Dental status:  Cardiovascular:  Regular rate and cardiac rhythm by pulse,  without distended neck veins. Respiratory: Lungs are clear to auscultation.  Skin:  Without evidence of ankle edema, or rash. Trunk: The patient's posture is erect.   Neurologic exam : The patient is awake and alert, oriented to place and time.   Memory subjective described as intact.  Attention span & concentration ability appears normal.  Speech is fluent,  without dysarthria, mild dysphonia , no aphasia.  Mood and affect are appropriate.   Cranial nerves: no loss of smell or taste reported  Pupils are equal and briskly reactive to light. Funduscopic exam deferred.   Extraocular movements in vertical and horizontal planes were intact and without nystagmus. No  Diplopia. Visual fields by finger perimetry are intact. Hearing was intact to soft voice and finger rubbing.    Facial sensation intact to fine touch.  Facial motor strength is symmetric and tongue and uvula move midline.  Neck ROM : rotation, tilt and flexion extension were normal for age and shoulder shrug was symmetrical.    Motor exam:  Symmetric bulk, tone and ROM.   Normal tone without cog wheeling, symmetric grip strength .   Sensory:  Fine touch, pinprick and vibration were tested.Proprioception tested in the upper extremities was normal. He felt vibration in the ankles(!)    Coordination: Rapid alternating movements in the fingers/hands were of normal speed.  The Finger-to-nose maneuver was intact without evidence of ataxia, dysmetria or tremor.   Gait and station: Patient could rise unassisted from a seated position, walked without assistive device.  Stance is of normal width/ base and the patient turned with 3 steps.  Toe and heel walk were deferred.  Deep tendon reflexes: in the upper and lower extremities are symmetric ( level 1 ) and intact.  Babinski response was deferred.     After spending a total time of  46  minutes face to face and additional time for physical and neurologic examination, review of laboratory studies,  personal review of imaging studies, reports and results of other testing and review of referral information / records as far as provided in visit, I have established the following assessments:  Juan [name] is a 79 year old gentleman who just celebrated his birthday on [birth year].  He has a history of elevated blood pressures and has been diagnosed with an abdominal aortic aneurysm this has been stable.  He has developed gout and is therefore no longer using a diuretic.  He has a history of diabetes mellitus and on May 12, 2019 his hemoglobin A1c was 7.2.  His HDL was low at 39 his triglycerides were high at 192 his cholesterol appears well controlled on  current medication his GERD is well controlled on current medication, he is borderline anemic by his last CBC his white blood cell count was in normal level.  Creatinine was 1.52 estimating a GFR of 40 or less.  He has no abnormal liver function tests.  He has a history of coronary artery disease, he is now reaching chronic kidney disease stage III, he has diabetic mellitus with complication and documented retinopathy, nephropathy.  History of poliomyelitis in childhood without residual effect, in 2017 he was diagnosed with a 50 to 70% stenosis of the right internal carotid artery.  Medications are listed the only concern about daytime sleepiness would be arising from using hydrocodone which is as needed for cough and was not prescribed as a pain medication.  Given his most recent development of a diabetic retinopathy which became visible after cataract surgery I will certainly need to establish if he has obstructive sleep apnea or not I  can offer a home sleep test or an attended sleep study.  I will order both tests and let his [facility] Medicare decide which one we will do.     My Plan is to proceed with:  1) HST or PSG 2) reduce caffeine intake late in the day.  3) advance bedtime before midnight-    I would like to thank Dr [name], MD , Dr. Lorrine Kin, MD and Susy Frizzle, [address] [place],  [place] [address] for allowing me to meet with and to take care of this pleasant patient.   In short, Juan [name] is presenting with retinopathy and followed by dr [name]- he gets eye injections.he has multiple conditions that are associated with sleep apnea and worsen if OSA remains untreated.  I plan to follow up either personally or through our NP within 2-3 month.  .  Electronically signed by: Larey Seat, MD 07/03/2019 11:08 AM  [facility] Board certified by The [employer] of Sleep Medicine and Diplomate of the [employer] of  Sleep Medicine. Board certified In Neurology through the [place], Fellow of the [employer] of Neurology. Medical Director of [employer].

## 2019-07-14 ENCOUNTER — Encounter (INDEPENDENT_AMBULATORY_CARE_PROVIDER_SITE_OTHER): Payer: Self-pay | Admitting: Ophthalmology

## 2019-07-14 ENCOUNTER — Other Ambulatory Visit: Payer: Self-pay

## 2019-07-14 ENCOUNTER — Ambulatory Visit (INDEPENDENT_AMBULATORY_CARE_PROVIDER_SITE_OTHER): Payer: Medicare HMO | Admitting: Ophthalmology

## 2019-07-14 DIAGNOSIS — H43822 Vitreomacular adhesion, left eye: Secondary | ICD-10-CM

## 2019-07-14 DIAGNOSIS — H35351 Cystoid macular degeneration, right eye: Secondary | ICD-10-CM

## 2019-07-14 DIAGNOSIS — H43823 Vitreomacular adhesion, bilateral: Secondary | ICD-10-CM

## 2019-07-14 DIAGNOSIS — H35352 Cystoid macular degeneration, left eye: Secondary | ICD-10-CM | POA: Diagnosis not present

## 2019-07-14 DIAGNOSIS — E119 Type 2 diabetes mellitus without complications: Secondary | ICD-10-CM | POA: Diagnosis not present

## 2019-07-14 DIAGNOSIS — H18231 Secondary corneal edema, right eye: Secondary | ICD-10-CM | POA: Insufficient documentation

## 2019-07-14 MED ORDER — KETOROLAC TROMETHAMINE 0.5 % OP SOLN: 1.0000 [drp] | Freq: Four times a day (QID) | OPHTHALMIC | 3 refills | Status: DC

## 2019-07-14 NOTE — Progress Notes (Signed)
07/14/2019     CHIEF COMPLAINT Patient presents for Retina Follow Up   HISTORY OF PRESENT ILLNESS: Juan [name] is a 79 y.o. male who presents to the clinic today for:   HPI    Retina Follow Up    Patient presents with  Other (CME).  In left eye.  Duration of 5 weeks.  Since onset it is stable.  I, the attending physician,  performed the HPI with the patient and updated documentation appropriately.          Comments    5 Week CME f\u OS.  POSSIBLE ST KENALOG 40 MGMS, RETROSEPTAL OS  Pt states no changes or issues. Pt has seen occasional floaters. Using gtts as directed, needs refill.  BGL: did not check       Last edited by Tilda Franco on 07/14/2019  2:39 PM. (History)      Referring physician: Susy Frizzle, MD [address] [place],  [place] [address]  HISTORICAL INFORMATION:   Selected notes from the MEDICAL RECORD NUMBER    Lab Results  Component Value Date   HGBA1C 7.2 (H) 05/12/2019     CURRENT MEDICATIONS: Current Outpatient Medications (Ophthalmic Drugs)  Medication Sig  . ketorolac (ACULAR) 0.5 % ophthalmic solution Place 1 drop into both eyes 4 (four) times daily.   No current facility-administered medications for this visit. (Ophthalmic Drugs)   Current Outpatient Medications (Other)  Medication Sig  . allopurinol (ZYLOPRIM) 300 MG tablet TAKE 1 TABLET EVERY DAY  . aspirin EC 81 MG tablet Take 81 mg by mouth daily.  . Blood Glucose Monitoring Suppl (TRUE METRIX METER) w/Device KIT Use as Directed  . diltiazem (CARDIZEM CD) 240 MG 24 hr capsule TAKE 1 CAPSULE EVERY DAY  . doxazosin (CARDURA) 4 MG tablet TAKE 1 TABLET EVERY DAY  . glipiZIDE (GLUCOTROL) 5 MG tablet TAKE 1 TABLET TWO TIMES DAILY BEFORE  MEALS  . glucose blood (TRUE METRIX BLOOD GLUCOSE TEST) test strip Check BS QD - dx: e11.9  . Lancet Devices (PRODIGY LANCING DEVICE) MISC Use bid DX e11.9  . lisinopril (ZESTRIL) 40 MG tablet TAKE 1 TABLET EVERY DAY  . metoprolol  succinate (TOPROL-XL) 25 MG 24 hr tablet Take 1 tablet (25 mg total) by mouth daily.  . Multiple Vitamin (MULTIVITAMIN WITH MINERALS) TABS Take 1 tablet by mouth daily.  . Omega-3 Fatty Acids (FISH OIL PO) Take by mouth.  . pantoprazole (PROTONIX) 20 MG tablet Take 1 tablet (20 mg total) by mouth daily.  . rosuvastatin (CRESTOR) 20 MG tablet TAKE 1 TABLET (20 MG TOTAL) BY MOUTH DAILY.  . sitaGLIPtin (JANUVIA) 100 MG tablet Take 1 tablet (100 mg total) by mouth daily.  . TRUEPLUS LANCETS 33G MISC Check BS QD - dx: e11.9   No current facility-administered medications for this visit. (Other)      REVIEW OF SYSTEMS: ROS    Positive for: Endocrine   Last edited by Tilda Franco on 07/14/2019  2:38 PM. (History)       ALLERGIES Allergies  Allergen Reactions  . Jardiance [Empagliflozin]     Severe nausea-     PAST MEDICAL HISTORY Past Medical History:  Diagnosis Date  . AAA (abdominal aortic aneurysm) ([place]) 8/12   3.5cm  . CAD (coronary artery disease)   . CKD (chronic kidney disease) stage 3, GFR 30-59 ml/min   . Colon polyps   . Diabetes mellitus without complication ([place])   . Hx of poliomyelitis  without residual effect 1954  . Hyperlipidemia   . Hypertension   . 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 Neuromuscular disorder ([place])    L2-3,L5-S1 bulging disc /nerve impingement  . PAD (peripheral artery disease) ([place])    Past Surgical History:  Procedure Laterality Date  . COLONOSCOPY N/A 09/27/2012   Procedure: COLONOSCOPY;  Surgeon: Daneil Dolin, MD;  [facility];  Service: Endoscopy;  Laterality: N/A;  9:30  . CORONARY ANGIOPLASTY     1989  . TONSILLECTOMY      FAMILY HISTORY Family History  Problem Relation Age of Onset  . Diabetes Mother   . Heart disease Mother        After age 32  . Heart attack Mother   . Hypertension Mother   . Diabetes Father   . Heart disease Father        After age 75   . Hypertension Father   . Heart attack Father   . Brain cancer Sister   . Cancer Sister        Brain  . Diabetes Sister   . Hypertension Sister   . Cancer Brother        Chest Tumor  . Diabetes Brother   . Hypertension Brother   . Deep vein thrombosis Sister   . Diabetes Sister   . Diabetes Brother        Bilateral leg  . Colon cancer Neg Hx     SOCIAL HISTORY Social History   Tobacco Use  . Smoking status: Former Smoker    Years: 35.00    Types: Cigarettes    Quit date: 01/02/1988    Years since quitting: 31.5  . Smokeless tobacco: Never Used  Substance Use Topics  . Alcohol use: Not Currently    Comment: rare beer, no history of ETOH abuse  . Drug use: No         OPHTHALMIC EXAM:  Base Eye Exam    Visual Acuity (Snellen - Linear)      Right Left   Dist [place] 20/25 20/80   Dist ph [place]  NI       Tonometry (Tonopen, 2:43 PM)      Right Left   Pressure 11 10       Pupils      Pupils Dark Light Shape React APD   Right PERRL 2 2 Round Minimal None   Left PERRL 2 2 Round Minimal None       Visual Fields (Counting fingers)      Left Right    Full Full       Neuro/Psych    Oriented x3: Yes   Mood/Affect: Normal       Dilation    Left eye: 1.0% Mydriacyl, 2.5% Phenylephrine @ 2:43 PM        Slit Lamp and Fundus Exam    External Exam      Right Left   External Normal Normal       Slit Lamp Exam      Right Left   Lids/Lashes Normal Normal   Conjunctiva/Sclera White and quiet White and quiet   Cornea Clear Clear   Anterior Chamber Deep and quiet Deep and quiet   Iris Round and reactive Round and reactive   Lens Posterior chamber intraocular lens Posterior chamber intraocular lens   Anterior Vitreous Normal Normal       Fundus Exam  Right Left   Posterior Vitreous Normal Normal   Disc Normal Normal   C/D Ratio 0.2 0.2   Macula Normal ,, no clinical CME, 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 Scan locations included subfoveal. Progression has improved. Findings include normal observations, vitreomacular adhesion .   Left Eye Quality was good. Scan locations included subfoveal. Progression has improved. Findings include cystoid macular edema.   Notes OD, CME completely resolved upon institution of topical NSAID therapy 6 weeks prior.  OS, CME improved dramatically on topical CME and after recent    subtenon's retroseptal Kenalog injection, 6 weeks prior                ASSESSMENT/PLAN:  Cystoid macular edema of right eye Improved after recent topical NSAID therapy, 4 times daily, ketorolac will discontinue.  Will discontinue ketorolac OD  Cystoid macular edema of left eye Improved nicely after retroseptal Kenalog 6 weeks prior combined with topical ketorolac 4 times daily.  We will continue ketorolac 4 times daily OS      ICD-10-CM   1. Cystoid macular edema of left eye  H35.352 OCT, Retina - OU - Both Eyes  2. Cystoid macular edema of right eye  H35.351 OCT, Retina - OU - Both Eyes  3. Vitreomacular adhesion of left eye  H43.822 OCT, Retina - OU - Both Eyes  4. Vitreomacular adhesion of both eyes  H43.823 OCT, Retina - OU - Both Eyes  5. Diabetes mellitus without complication ([place])  V67.2     1.  OD, will discontinue topical ketorolac  2.  OS, will continue topical ketorolac 4 times daily  3.  Ophthalmic Meds Ordered this visit:  No orders of the defined types were placed in this encounter.      No follow-ups on file.  There are no Patient Instructions on file for this visit.   Explained the diagnoses, plan, and follow up with the patient and they expressed understanding.  Patient expressed understanding of the importance of proper follow up care.   Clent Demark Reham Slabaugh M.D. Diseases & Surgery of  the Retina and Vitreous Retina & Diabetic [place] 07/14/19     Abbreviations: M myopia (nearsighted); A astigmatism; H hyperopia (farsighted); P presbyopia; Mrx spectacle prescription;  CTL contact lenses; OD right eye; OS left eye; OU both eyes  XT exotropia; ET esotropia; PEK punctate epithelial keratitis; PEE punctate epithelial erosions; DES dry eye syndrome; MGD meibomian gland dysfunction; ATs artificial tears; PFAT's preservative free artificial tears; [place] nuclear sclerotic cataract; PSC posterior subcapsular cataract; ERM epi-retinal membrane; PVD posterior vitreous detachment; RD retinal detachment; DM diabetes mellitus; DR diabetic retinopathy; NPDR non-proliferative diabetic retinopathy; PDR proliferative diabetic retinopathy; CSME clinically significant macular edema; DME diabetic macular edema; dbh dot blot hemorrhages; CWS cotton wool spot; POAG primary open angle glaucoma; C/D cup-to-disc ratio; HVF humphrey visual field; GVF goldmann visual field; OCT optical coherence tomography; IOP intraocular pressure; BRVO Branch retinal vein occlusion; CRVO central retinal vein occlusion; CRAO central retinal artery occlusion; BRAO branch retinal artery occlusion; RT retinal tear; SB scleral buckle; PPV pars plana vitrectomy; VH Vitreous hemorrhage; PRP panretinal laser photocoagulation; IVK intravitreal kenalog; VMT vitreomacular traction; MH Macular hole;  NVD neovascularization of the disc; NVE neovascularization  elsewhere; AREDS age related eye disease study; ARMD age related macular degeneration; POAG primary open angle glaucoma; EBMD epithelial/anterior basement membrane dystrophy; ACIOL anterior chamber intraocular lens; IOL intraocular lens; PCIOL posterior chamber intraocular lens; Phaco/IOL phacoemulsification with intraocular lens placement; [place] photorefractive keratectomy; LASIK laser assisted in situ keratomileusis; HTN hypertension; DM diabetes mellitus; COPD chronic obstructive  pulmonary disease

## 2019-07-14 NOTE — Assessment & Plan Note (Signed)
Improved nicely after retroseptal Kenalog 6 weeks prior combined with topical ketorolac 4 times daily.  We will continue ketorolac 4 times daily OS

## 2019-07-14 NOTE — Assessment & Plan Note (Signed)
Improved after recent topical NSAID therapy, 4 times daily, ketorolac will discontinue.  Will discontinue ketorolac OD

## 2019-08-07 ENCOUNTER — Other Ambulatory Visit: Payer: Self-pay

## 2019-08-07 ENCOUNTER — Ambulatory Visit (INDEPENDENT_AMBULATORY_CARE_PROVIDER_SITE_OTHER): Payer: Medicare HMO | Admitting: Neurology

## 2019-08-07 DIAGNOSIS — I714 Abdominal aortic aneurysm, without rupture, unspecified: Secondary | ICD-10-CM

## 2019-08-07 DIAGNOSIS — I2510 Atherosclerotic heart disease of native coronary artery without angina pectoris: Secondary | ICD-10-CM

## 2019-08-07 DIAGNOSIS — I2583 Coronary atherosclerosis due to lipid rich plaque: Secondary | ICD-10-CM

## 2019-08-07 DIAGNOSIS — G4733 Obstructive sleep apnea (adult) (pediatric): Secondary | ICD-10-CM | POA: Diagnosis not present

## 2019-08-07 DIAGNOSIS — E13311 Other specified diabetes mellitus with unspecified diabetic retinopathy with macular edema: Secondary | ICD-10-CM

## 2019-08-07 DIAGNOSIS — Z9861 Coronary angioplasty status: Secondary | ICD-10-CM

## 2019-08-07 DIAGNOSIS — I739 Peripheral vascular disease, unspecified: Secondary | ICD-10-CM

## 2019-08-07 DIAGNOSIS — I499 Cardiac arrhythmia, unspecified: Secondary | ICD-10-CM

## 2019-08-07 DIAGNOSIS — N1832 Chronic kidney disease, stage 3b: Secondary | ICD-10-CM

## 2019-08-13 ENCOUNTER — Encounter: Payer: Self-pay | Admitting: Family Medicine

## 2019-08-15 ENCOUNTER — Encounter: Payer: Self-pay | Admitting: Family Medicine

## 2019-08-15 ENCOUNTER — Ambulatory Visit (INDEPENDENT_AMBULATORY_CARE_PROVIDER_SITE_OTHER): Payer: Medicare HMO | Admitting: Family Medicine

## 2019-08-15 ENCOUNTER — Other Ambulatory Visit: Payer: Self-pay

## 2019-08-15 VITALS — BP 142/60 | HR 75 | Temp 96.7°F | Ht 70.5 in | Wt 249.0 lb

## 2019-08-15 DIAGNOSIS — M545 Low back pain, unspecified: Secondary | ICD-10-CM

## 2019-08-15 DIAGNOSIS — Z125 Encounter for screening for malignant neoplasm of prostate: Secondary | ICD-10-CM | POA: Diagnosis not present

## 2019-08-15 DIAGNOSIS — R8290 Unspecified abnormal findings in urine: Secondary | ICD-10-CM

## 2019-08-15 LAB — URINALYSIS, ROUTINE W REFLEX MICROSCOPIC
Bacteria, UA: NONE SEEN /HPF
Bilirubin Urine: NEGATIVE
Glucose, UA: NEGATIVE
Hgb urine dipstick: NEGATIVE
Hyaline Cast: NONE SEEN /LPF
Ketones, ur: NEGATIVE
Nitrite: NEGATIVE
RBC / HPF: NONE SEEN /HPF (ref 0–2)
Specific Gravity, Urine: 1.015 (ref 1.001–1.03)
Squamous Epithelial / HPF: NONE SEEN /HPF (ref <=5)
pH: 6.5 (ref 5.0–8.0)

## 2019-08-15 LAB — PSA: PSA: 0.9 ng/mL (ref <=4.0)

## 2019-08-15 LAB — MICROSCOPIC MESSAGE

## 2019-08-15 MED ORDER — SULFAMETHOXAZOLE-TRIMETHOPRIM 800-160 MG PO TABS: 1.0000 | ORAL_TABLET | Freq: Two times a day (BID) | ORAL | 0 refills | Status: DC

## 2019-08-15 NOTE — Progress Notes (Signed)
Subjective:    Patient ID: Juan [name], male    DOB: 1940/08/09, 79 y.o.   MRN: [id number]  Medication Refill  Urinary Tract Infection      Patient is worried that he may have an issue with his prostate.  He states that over the last month, he has had a foul-smelling urine.  He also complains of low back pain.  He states that he can only walk short distances due to the pain and pressure in his lower back.  He had a friend who has similar symptoms who later found out he had prostate cancer.  Patient denies any hesitancy.  He denies any urgency.  He does occasionally have dysuria.  Urinalysis today shows trace leukocyte esterase but is otherwise normal.  He denies any fevers or chills.  He has no CVA tenderness.  He does have tenderness along the paraspinal muscles in his lower back.  Prostate exams performed today.  Prostate is not enlarged, and is nontender, and there is no nodularity. Office Visit on 08/15/2019  Component Date Value Ref Range Status  . Color, Urine 08/15/2019 YELLOW  YELLOW Final  . APPearance 08/15/2019 CLEAR  CLEAR Final  . Specific Gravity, Urine 08/15/2019 1.015  1.001 - 1.03 Final  . pH 08/15/2019 6.5  5.0 - 8.0 Final  . Glucose, UA 08/15/2019 NEGATIVE  NEGATIVE Final  . Bilirubin Urine 08/15/2019 NEGATIVE  NEGATIVE Final  . Ketones, ur 08/15/2019 NEGATIVE  NEGATIVE Final  . Hgb urine dipstick 08/15/2019 NEGATIVE  NEGATIVE Final  . Protein, ur 08/15/2019 1+* NEGATIVE Final  . Nitrite 08/15/2019 NEGATIVE  NEGATIVE Final  . Leukocytes,Ua 08/15/2019 TRACE* NEGATIVE Final  . WBC, UA 08/15/2019 0-5  0 - 5 /HPF Final  . RBC / HPF 08/15/2019 NONE SEEN  0 - 2 /HPF Final  . Squamous Epithelial / LPF 08/15/2019 NONE SEEN  < OR = 5 /HPF Final  . Bacteria, UA 08/15/2019 NONE SEEN  NONE SEEN /HPF Final  . Hyaline Cast 08/15/2019 NONE SEEN  NONE SEEN /LPF Final  . Note 08/15/2019    Final   Comment: This urine was analyzed for the presence of WBC,  RBC, bacteria, casts,  and other formed elements.  Only those elements seen were reported. . .     Past Medical History:  Diagnosis Date  . AAA (abdominal aortic aneurysm) ([place]) 8/12   3.5cm  . CAD (coronary artery disease)   . CKD (chronic kidney disease) stage 3, GFR 30-59 ml/min   . Colon polyps   . Diabetes mellitus without complication ([place])   . 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 Hyperlipidemia   . Hypertension   . 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 Neuromuscular disorder ([place])    L2-3,L5-S1 bulging disc /nerve impingement  . PAD (peripheral artery disease) ([place])    Past Surgical History:  Procedure Laterality Date  . COLONOSCOPY N/A 09/27/2012   Procedure: COLONOSCOPY;  Surgeon: Daneil Dolin, MD;  Location[facility];  Service: Endoscopy;  Laterality: N/A;  9:30  . CORONARY ANGIOPLASTY     1989  . TONSILLECTOMY     Current Outpatient Medications on File Prior to Visit  Medication Sig Dispense Refill  . allopurinol (ZYLOPRIM) 300 MG tablet TAKE 1 TABLET EVERY DAY 90 tablet 3  . aspirin EC 81 MG tablet Take 81 mg by mouth daily.    . Blood Glucose Monitoring Suppl (TRUE  METRIX METER) w/Device KIT Use as Directed 1 kit 1  . diltiazem (CARDIZEM CD) 240 MG 24 hr capsule TAKE 1 CAPSULE EVERY DAY 90 capsule 3  . doxazosin (CARDURA) 4 MG tablet TAKE 1 TABLET EVERY DAY 90 tablet 3  . glipiZIDE (GLUCOTROL) 5 MG tablet TAKE 1 TABLET TWO TIMES DAILY BEFORE  MEALS 180 tablet 2  . glucose blood (TRUE METRIX BLOOD GLUCOSE TEST) test strip Check BS QD - dx: e11.9 100 each 12  . ketorolac (ACULAR) 0.5 % ophthalmic solution Place 1 drop into the left eye 4 (four) times daily. 5 mL 3  . Lancet Devices (PRODIGY LANCING DEVICE) MISC Use bid DX e11.9 1 each 2  . lisinopril (ZESTRIL) 40 MG tablet TAKE 1 TABLET EVERY DAY 90 tablet 3  . metoprolol succinate (TOPROL-XL) 25 MG 24 hr tablet Take 1 tablet (25 mg total) by mouth daily. 90 tablet 3   . Multiple Vitamin (MULTIVITAMIN WITH MINERALS) TABS Take 1 tablet by mouth daily.    . Omega-3 Fatty Acids (FISH OIL PO) Take by mouth.    . pantoprazole (PROTONIX) 20 MG tablet Take 1 tablet (20 mg total) by mouth daily. 30 tablet 5  . rosuvastatin (CRESTOR) 20 MG tablet TAKE 1 TABLET (20 MG TOTAL) BY MOUTH DAILY. 90 tablet 3  . sitaGLIPtin (JANUVIA) 100 MG tablet Take 1 tablet (100 mg total) by mouth daily. 90 tablet 1  . TRUEPLUS LANCETS 33G MISC Check BS QD - dx: e11.9 100 each 3   No current facility-administered medications on file prior to visit.   Allergies  Allergen Reactions  . Jardiance [Empagliflozin]     Severe nausea-    Social History   Socioeconomic History  . Marital status: Married    Spouse name: Not on file  . Number of children: Not on file  . Years of education: Not on file  . Highest education level: Not on file  Occupational History  . Occupation: retired    Fish farm manager: RETIRED    Comment: [employer]   Tobacco Use  . Smoking status: Former Smoker    Years: 35.00    Types: Cigarettes    Quit date: 01/02/1988    Years since quitting: 31.6  . Smokeless tobacco: Never Used  Substance and Sexual Activity  . Alcohol use: Not Currently    Comment: rare beer, no history of ETOH abuse  . Drug use: No  . Sexual activity: Not on file  Other Topics Concern  . Not on file  Social History Narrative  . Not on file   Social Determinants of Health   Financial Resource Strain:   . Difficulty of Paying Living Expenses:   Food Insecurity:   . Worried About Charity fundraiser in the Last Year:   . Arboriculturist in the Last Year:   Transportation Needs:   . Film/video editor (Medical):   Marland Kitchen Lack of Transportation (Non-Medical):   Physical Activity:   . Days of Exercise per Week:   . Minutes of Exercise per Session:   Stress:   . Feeling of Stress :   Social Connections:   . Frequency of Communication with Friends and Family:   . Frequency of  Social Gatherings with Friends and Family:   . Attends Religious Services:   . Active Member of Clubs or Organizations:   . Attends Archivist Meetings:   Marland Kitchen Marital Status:   Intimate Partner Violence:   . Fear of Current or  Ex-Partner:   . Emotionally Abused:   Marland Kitchen Physically Abused:   . Sexually Abused:    Family History  Problem Relation Age of Onset  . Diabetes Mother   . Heart disease Mother        After age 31  . Heart attack Mother   . Hypertension Mother   . Diabetes Father   . Heart disease Father        After age 38  . Hypertension Father   . Heart attack Father   . Brain cancer Sister   . Cancer Sister        Brain  . Diabetes Sister   . Hypertension Sister   . Cancer Brother        Chest Tumor  . Diabetes Brother   . Hypertension Brother   . Deep vein thrombosis Sister   . Diabetes Sister   . Diabetes Brother        Bilateral leg  . Colon cancer Neg Hx       Review of Systems  All other systems reviewed and are negative.      Objective:   Physical Exam  Constitutional: He appears well-developed and well-nourished. No distress.  HENT:  Head: Normocephalic and atraumatic.  Cardiovascular: Normal rate, regular rhythm, normal heart sounds and intact distal pulses. Exam reveals no gallop and no friction rub.  No murmur heard. Pulmonary/Chest: Effort normal and breath sounds normal. No respiratory distress. He has no wheezes. He has no rales. He exhibits no tenderness.  Genitourinary:    Prostate normal.   Musculoskeletal:        General: No edema.     Lumbar back: Pain and tenderness present. Decreased range of motion.  Skin: He is not diaphoretic.  Vitals reviewed.         Assessment & Plan:  Foul smelling urine - Plan: Urinalysis, Routine w reflex microscopic, Urine Culture, PSA, sulfamethoxazole-trimethoprim (BACTRIM DS) 800-160 MG tablet  Acute midline low back pain, unspecified whether sciatica present - Plan: DG Lumbar Spine  Complete  Prostate exam today is normal.  I see no evidence of prostate cancer or prostatitis however patient would like a PSA check.  Urinalysis is slightly abnormal.  Is possible he may have a slight urinary tract infection.  I will treat him with Bactrim double strength tablets twice daily for a week.  Meanwhile send urine culture.  Instead I believe his low back pain is likely musculoskeletal in nature.  Begin by taking an x-ray of the lumbar spine.

## 2019-08-16 LAB — URINE CULTURE
MICRO NUMBER:: 10554525
Result:: NO GROWTH
SPECIMEN QUALITY:: ADEQUATE

## 2019-08-17 ENCOUNTER — Other Ambulatory Visit: Payer: Self-pay | Admitting: Family Medicine

## 2019-08-18 ENCOUNTER — Other Ambulatory Visit: Payer: Self-pay

## 2019-08-18 ENCOUNTER — Ambulatory Visit (HOSPITAL_COMMUNITY)
Admission: RE | Admit: 2019-08-18 | Discharge: 2019-08-18 | Disposition: A | Discharge status: Home / Self Care | Payer: Medicare HMO | Source: Ambulatory Visit | Attending: Family Medicine | Admitting: Family Medicine

## 2019-08-18 DIAGNOSIS — M545 Low back pain, unspecified: Secondary | ICD-10-CM

## 2019-08-20 ENCOUNTER — Other Ambulatory Visit: Payer: Self-pay

## 2019-08-20 DIAGNOSIS — I714 Abdominal aortic aneurysm, without rupture, unspecified: Secondary | ICD-10-CM

## 2019-08-21 DIAGNOSIS — I499 Cardiac arrhythmia, unspecified: Secondary | ICD-10-CM | POA: Insufficient documentation

## 2019-08-21 NOTE — Addendum Note (Signed)
Addended by: Larey Seat on: 08/21/2019 06:29 PM   Modules accepted: Orders

## 2019-08-21 NOTE — Procedures (Signed)
PATIENT'S NAME:  Juan [name], Juan [name] DOB:      05/17/1940      MR#:    [id number]     DATE OF RECORDING: 08/07/2019 REFERRING M.D.:  Juan Lair, MD via Juan Luo, MD Study Performed:   Baseline Polysomnogram HISTORY:  Juan [name] was seen upon request of his retail specialist Dr Juan [name] on 07/03/2019. He a right-handed male patient of [place] ancestry with a history of AAA (abdominal aortic aneurysm) ([place]) (8/12), CAD (coronary artery disease), CKD (chronic kidney disease) stage 3, GFR 30-59 ml/min, Colon polyps, Hyperlipidemia, Hypertension, Diabetes mellitus with neurological, nephrological and retinal complication ([place]),  Childhood history of poliomyelitis without residual effect/ never ventilated/ gait was affected. 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 Dr Juan Rhine, MD, felt like he needed to assess if OSA was a concern. Patient snores but unsure of apnea events.  The patient endorsed the Epworth Sleepiness Scale at 4 points.   The patient's weight 246 pounds with a height of 70 (inches), resulting in a BMI of 35.3 kg/m2. The patient's neck circumference measured 18.5 inches.  CURRENT MEDICATIONS: Zyloprim, Aspirin, Cardizem, Cardura, Glucotrol, Zestril, Toprol, Multivitamins, fish oil, Protonix, Crestor, Januvia,   PROCEDURE:  This is a multichannel digital polysomnogram utilizing the Somnostar 11.2 system.  Electrodes and sensors were applied and monitored per AASM Specifications.   EEG, EOG, Chin and Limb EMG, were sampled at 200 Hz.  ECG, Snore and Nasal Pressure, Thermal Airflow, Respiratory Effort, CPAP Flow and Pressure, Oximetry was sampled at 50 Hz. Digital video and audio were recorded.      BASELINE STUDY: Lights Out was at 21:56 and Lights On at 05:00.  Total recording time (TRT) was 424 minutes, with a total sleep time (TST) of  302 minutes.   The patient's sleep latency was 38 minutes.  REM latency was 200.5 minutes.  The sleep efficiency was 71.2 %.     SLEEP ARCHITECTURE: WASO (Wake after sleep onset) was 112 minutes.  There were 21 minutes in Stage N1, 187.5 minutes Stage N2, 48 minutes Stage N3 and 45.5 minutes in Stage REM.  The percentage of Stage N1 was 7.%, Stage N2 was 62.1%, Stage N3 was 15.9% and Stage R (REM sleep) was 15.1%.    RESPIRATORY ANALYSIS:  There were a total of 34 respiratory events:  2 obstructive apneas, 0 central apneas and 0 mixed apneas with a total of 2 apneas and an apnea index (AI) of .4 /hour. There were 32 hypopneas with a hypopnea index of 6.4 /hour. The patient also had 16 respiratory event related arousals (RERAs).     The total APNEA/HYPOPNEA INDEX (AHI) was 6.8/hour and the total RESPIRATORY DISTURBANCE INDEX was 10.2 /hour.  20 events occurred in REM sleep and 24 events in NREM. The REM AHI was  26.4 /hour, versus a non-REM AHI of 3.3. The patient spent 16 minutes of total sleep time in the supine position and 286 minutes in non-supine.. The supine AHI was 37.5 versus a non-supine AHI of 5.0.  OXYGEN SATURATION & C02:  The Wake baseline 02 saturation was 95%, with the lowest being 84%. Time spent below 89% saturation equaled 21 minutes, and was clustered during REM sleep, lasting up to 90 seconds . The arousals were noted as: 37 were spontaneous, 10 were associated with PLMs, 11 were associated with respiratory events. The patient had a total  of 150 Periodic Limb Movements.  The Periodic Limb Movement (PLM) Arousal index was 2./hour. No PLMs were seen during REM sleep, none were recorded after 00.30 AM.   Audio and video analysis did not show any abnormal or unusual movements, behaviors, phonations or vocalizations.  Loud Snoring was noted. EKG was highly irregular- see screen shot,    IMPRESSION:  1. Mild Obstructive Sleep Hypopnea /Apnea (OSA) by AHI 6.8/h, but strongly accentuated  in REM sleep to AHI 26.4/h and in supine sleep to an AHI of 37.5/h.  2. Sleep Hypoxemia cluster during REM sleep, lasting up to 80 seconds.  3. Moderate-severe Periodic Limb Movement Disorder (PLMD) with 2 arousals per hour of sleep. 4. Loud Snoring, dependent on supine position and REM sleep.  5. 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 Additionally, evidence of hypoxemia and cardiac irregular rhythm was found. This REM dependent sleep apnea will not respond to a dental device or inspire technology. The optimal treatment would be by positive airway pressure.   Cardiac arrythmias were captured on several screen shots and will be attaches to the technical PSG report in EPIC.    PLMs are often seen in degenerative spine and joint disease patients, and those with neuropathy and PAD/PVD.    1. Advise full-night, attended, CPAP titration study to optimize therapy.   2. I will keep Dr Juan [name] and Dr Juan [name] informed.   I certify that I have reviewed the entire raw data recording prior to the issuance of this report in accordance with the Standards of Accreditation of the American Academy of Sleep Medicine (AASM)   Larey Seat, MD Diplomat, American Board of Psychiatry and Neurology  Diplomat, American Board of Sleep Medicine Market researcher, [place] [facility] [employer]

## 2019-08-21 NOTE — Progress Notes (Signed)
  IMPRESSION:  1. Mild Obstructive Sleep Hypopnea /Apnea (OSA) by AHI 6.8/h, but strongly accentuated in REM sleep to AHI 26.4/h and in supine sleep to an AHI of 37.5/h.  2. Sleep Hypoxemia cluster during REM sleep, lasting up to 80 seconds.  3. Moderate-severe Periodic Limb Movement Disorder (PLMD) with 2 arousals per hour of sleep. 4. Loud Snoring, dependent on supine position and REM sleep.  5. 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 Additionally, evidence of hypoxemia and cardiac irregular rhythm was found. This REM dependent sleep apnea will not respond to a dental device or inspire technology. The optimal treatment would be by positive airway pressure.   Cardiac arrythmias were captured on several screen shots and will be attaches to the technical PSG report in EPIC.    PLMs are often seen in degenerative spine and joint disease patients, and those with neuropathy and PAD/PVD.    1. Advise full-night, attended, CPAP titration study to optimize therapy.   2. I will keep Dr [name] and Dr Dennard Schaumann informed.

## 2019-08-25 ENCOUNTER — Other Ambulatory Visit: Payer: Self-pay

## 2019-08-25 ENCOUNTER — Encounter (INDEPENDENT_AMBULATORY_CARE_PROVIDER_SITE_OTHER): Payer: Self-pay | Admitting: Ophthalmology

## 2019-08-25 ENCOUNTER — Ambulatory Visit (INDEPENDENT_AMBULATORY_CARE_PROVIDER_SITE_OTHER): Payer: Medicare HMO | Admitting: Ophthalmology

## 2019-08-25 DIAGNOSIS — H35352 Cystoid macular degeneration, left eye: Secondary | ICD-10-CM

## 2019-08-25 DIAGNOSIS — H35351 Cystoid macular degeneration, right eye: Secondary | ICD-10-CM

## 2019-08-25 NOTE — Progress Notes (Signed)
08/25/2019     CHIEF COMPLAINT Patient presents for Retina Follow Up   HISTORY OF PRESENT ILLNESS: Juan [name] is a 79 y.o. male who presents to the clinic today for:   HPI    Retina Follow Up    Patient presents with  Other.  In left eye.  Duration of 6 weeks.  Since onset it is stable.          Comments    6 wk follow up - OCT OU Patient denies change in vision and overall has no complaints.        Last edited by Gerda Diss on 08/25/2019  3:07 PM. (History)      Referring physician: Susy Frizzle, MD [id number] [place],  [place] [address]  HISTORICAL INFORMATION:   Selected notes from the MEDICAL RECORD NUMBER    Lab Results  Component Value Date   HGBA1C 7.2 (H) 05/12/2019     CURRENT MEDICATIONS: Current Outpatient Medications (Ophthalmic Drugs)  Medication Sig  . ketorolac (ACULAR) 0.5 % ophthalmic solution Place 1 drop into the left eye 4 (four) times daily.   No current facility-administered medications for this visit. (Ophthalmic Drugs)   Current Outpatient Medications (Other)  Medication Sig  . allopurinol (ZYLOPRIM) 300 MG tablet TAKE 1 TABLET EVERY DAY  . aspirin EC 81 MG tablet Take 81 mg by mouth daily.  . Blood Glucose Monitoring Suppl (TRUE METRIX METER) w/Device KIT Use as Directed  . diltiazem (CARDIZEM CD) 240 MG 24 hr capsule TAKE 1 CAPSULE EVERY DAY  . doxazosin (CARDURA) 4 MG tablet TAKE 1 TABLET EVERY DAY  . glipiZIDE (GLUCOTROL) 5 MG tablet TAKE 1 TABLET TWO TIMES DAILY BEFORE  MEALS  . glucose blood (TRUE METRIX BLOOD GLUCOSE TEST) test strip Check BS QD - dx: e11.9  . Lancet Devices (PRODIGY LANCING DEVICE) MISC Use bid DX e11.9  . lisinopril (ZESTRIL) 40 MG tablet TAKE 1 TABLET EVERY DAY  . metoprolol succinate (TOPROL-XL) 25 MG 24 hr tablet Take 1 tablet (25 mg total) by mouth daily.  . Multiple Vitamin (MULTIVITAMIN WITH MINERALS) TABS Take 1 tablet by mouth daily.  . Omega-3 Fatty Acids (FISH OIL PO) Take by  mouth.  . pantoprazole (PROTONIX) 20 MG tablet Take 1 tablet (20 mg total) by mouth daily.  . rosuvastatin (CRESTOR) 20 MG tablet TAKE 1 TABLET (20 MG TOTAL) BY MOUTH DAILY.  . sitaGLIPtin (JANUVIA) 100 MG tablet Take 1 tablet (100 mg total) by mouth daily.  Marland Kitchen sulfamethoxazole-trimethoprim (BACTRIM DS) 800-160 MG tablet Take 1 tablet by mouth 2 (two) times daily.  . TRUEPLUS LANCETS 33G MISC Check BS QD - dx: e11.9   No current facility-administered medications for this visit. (Other)      REVIEW OF SYSTEMS:    ALLERGIES Allergies  Allergen Reactions  . Jardiance [Empagliflozin]     Severe nausea-     PAST MEDICAL HISTORY Past Medical History:  Diagnosis Date  . AAA (abdominal aortic aneurysm) ([place]) 8/12   3.5cm  . CAD (coronary artery disease)   . CKD (chronic kidney disease) stage 3, GFR 30-59 ml/min   . Colon polyps   . Diabetes mellitus without complication ([place])   . 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 Hyperlipidemia   . Hypertension   . 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 Neuromuscular disorder ([place])    L2-3,L5-S1 bulging disc /nerve impingement  .  PAD (peripheral artery disease) ([place])    Past Surgical History:  Procedure Laterality Date  . COLONOSCOPY N/A 09/27/2012   Procedure: COLONOSCOPY;  Surgeon: Daneil Dolin, MD;  [facility];  Service: Endoscopy;  Laterality: N/A;  9:30  . CORONARY ANGIOPLASTY     1989  . TONSILLECTOMY      FAMILY HISTORY Family History  Problem Relation Age of Onset  . Diabetes Mother   . Heart disease Mother        After age 4  . Heart attack Mother   . Hypertension Mother   . Diabetes Father   . Heart disease Father        After age 71  . Hypertension Father   . Heart attack Father   . Brain cancer Sister   . Cancer Sister        Brain  . Diabetes Sister   . Hypertension Sister   . Cancer Brother        Chest Tumor  . Diabetes Brother     . Hypertension Brother   . Deep vein thrombosis Sister   . Diabetes Sister   . Diabetes Brother        Bilateral leg  . Colon cancer Neg Hx     SOCIAL HISTORY Social History   Tobacco Use  . Smoking status: Former Smoker    Years: 35.00    Types: Cigarettes    Quit date: 01/02/1988    Years since quitting: 31.6  . Smokeless tobacco: Never Used  Vaping Use  . Vaping Use: Never used  Substance Use Topics  . Alcohol use: Not Currently    Comment: rare beer, no history of ETOH abuse  . Drug use: No         OPHTHALMIC EXAM:  Base Eye Exam    Visual Acuity (Snellen - Linear)      Right Left   Dist [place] 20/30 20/100+2   Dist ph  NI 20/80-2       Tonometry (Tonopen, 3:13 PM)      Right Left   Pressure 11 10       Visual Fields (Counting fingers)      Left Right    Full Full       Neuro/Psych    Oriented x3: Yes   Mood/Affect: Normal       Dilation    Left eye: 2.5% Phenylephrine, 1.0% Mydriacyl @ 3:13 PM        Slit Lamp and Fundus Exam    External Exam      Right Left   External Normal Normal       Slit Lamp Exam      Right Left   Lids/Lashes Normal Normal   Conjunctiva/Sclera White and quiet White and quiet   Cornea Clear Clear   Anterior Chamber Deep and quiet Deep and quiet   Iris Round and reactive Round and reactive   Lens Posterior chamber intraocular lens Posterior chamber intraocular lens   Anterior Vitreous Normal Normal       Fundus Exam      Right Left   Posterior Vitreous  Normal   Disc  Normal   C/D Ratio  0.2   Macula  ,, no clinical CME, 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 Scan locations included subfoveal. Central Foveal Thickness: 317. Progression has been stable. Findings include cystoid macular edema.   Left Eye Quality was good. Scan  locations included subfoveal. Central Foveal Thickness: 382. Progression has improved. Findings include cystoid macular edema.   Notes OS, much improved on topical ketorolac from his last visit 6 weeks previous we will continue                ASSESSMENT/PLAN:  Cystoid macular edema of left eye OS continues to improve on topical therapy alone ketorolac 4 times daily will continue.      ICD-10-CM   1. Cystoid macular edema of left eye  H35.352 OCT, Retina - OU - Both Eyes  2. Cystoid macular edema of right eye  H35.351     1.  2.  3.  Ophthalmic Meds Ordered this visit:  No orders of the defined types were placed in this encounter.      Return in about 6 weeks (around 10/06/2019) for DILATE OU, OCT.  There are no Patient Instructions on file for this visit.   Explained the diagnoses, plan, and follow up with the patient and they expressed understanding.  Patient expressed understanding of the importance of proper follow up care.   Clent Demark Odas Ozer M.D. Diseases & Surgery of the Retina and Vitreous [facility] 08/25/19     Abbreviations: M myopia (nearsighted); A astigmatism; H hyperopia (farsighted); P presbyopia; Mrx spectacle prescription;  CTL contact lenses; OD right eye; OS left eye; OU both eyes  XT exotropia; ET esotropia; PEK punctate epithelial keratitis; PEE punctate epithelial erosions; DES dry eye syndrome; MGD meibomian gland dysfunction; ATs artificial tears; PFAT's preservative free artificial tears; [place] nuclear sclerotic cataract; PSC posterior subcapsular cataract; ERM epi-retinal membrane; PVD posterior vitreous detachment; RD retinal detachment; DM diabetes mellitus; DR diabetic retinopathy; NPDR non-proliferative diabetic retinopathy; PDR proliferative diabetic retinopathy; CSME clinically significant macular edema; DME diabetic macular edema; dbh dot blot hemorrhages; CWS cotton wool spot; POAG primary open angle glaucoma; C/D  cup-to-disc ratio; HVF humphrey visual field; GVF goldmann visual field; OCT optical coherence tomography; IOP intraocular pressure; BRVO Branch retinal vein occlusion; CRVO central retinal vein occlusion; CRAO central retinal artery occlusion; BRAO branch retinal artery occlusion; RT retinal tear; SB scleral buckle; PPV pars plana vitrectomy; VH Vitreous hemorrhage; PRP panretinal laser photocoagulation; IVK intravitreal kenalog; VMT vitreomacular traction; MH Macular hole;  NVD neovascularization of the disc; NVE neovascularization elsewhere; AREDS age related eye disease study; ARMD age related macular degeneration; POAG primary open angle glaucoma; EBMD epithelial/anterior basement membrane dystrophy; ACIOL anterior chamber intraocular lens; IOL intraocular lens; PCIOL posterior chamber intraocular lens; Phaco/IOL phacoemulsification with intraocular lens placement; [place] photorefractive keratectomy; LASIK laser assisted in situ keratomileusis; HTN hypertension; DM diabetes mellitus; COPD chronic obstructive pulmonary disease

## 2019-08-25 NOTE — Assessment & Plan Note (Signed)
OS continues to improve on topical therapy alone ketorolac 4 times daily will continue.

## 2019-08-27 ENCOUNTER — Telehealth: Payer: Self-pay | Admitting: Neurology

## 2019-08-27 NOTE — Telephone Encounter (Signed)
-----   Message from Larey Seat, MD sent at 08/21/2019  6:28 PM EDT -----  IMPRESSION:  1. Mild Obstructive Sleep Hypopnea /Apnea (OSA) by AHI 6.8/h, but strongly accentuated in REM sleep to AHI 26.4/h and in supine sleep to an AHI of 37.5/h.  2. Sleep Hypoxemia cluster during REM sleep, lasting up to 80 seconds.  3. Moderate-severe Periodic Limb Movement Disorder (PLMD) with 2 arousals per hour of sleep. 4. Loud Snoring, dependent on supine position and REM sleep.  5. 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 Additionally, evidence of hypoxemia and cardiac irregular rhythm was found. This REM dependent sleep apnea will not respond to a dental device or inspire technology. The optimal treatment would be by positive airway pressure.   Cardiac arrythmias were captured on several screen shots and will be attaches to the technical PSG report in EPIC.    PLMs are often seen in degenerative spine and joint disease patients, and those with neuropathy and PAD/PVD.    1. Advise full-night, attended, CPAP titration study to optimize therapy.   2. I will keep Dr [name] and Dr Dennard Schaumann informed.

## 2019-08-27 NOTE — Telephone Encounter (Signed)
I called pt and spoke to his wife. I advised pt that Dr. Brett Fairy reviewed their sleep study results and found that has mild sleep apnea and recommends that pt be treated with a cpap. Dr. Brett Fairy recommends that pt return for a repeat sleep study in order to properly titrate the cpap and ensure a good mask fit. Pt is agreeable to returning for a titration study. I advised pt that our sleep lab will file with pt's insurance and call pt to schedule the sleep study when we hear back from the pt's insurance regarding coverage of this sleep study. Pt verbalized understanding of results. Pt had no questions at this time but was encouraged to call back if questions arise.

## 2019-09-15 ENCOUNTER — Ambulatory Visit (INDEPENDENT_AMBULATORY_CARE_PROVIDER_SITE_OTHER): Payer: Medicare HMO | Admitting: Neurology

## 2019-09-15 DIAGNOSIS — E13311 Other specified diabetes mellitus with unspecified diabetic retinopathy with macular edema: Secondary | ICD-10-CM

## 2019-09-15 DIAGNOSIS — I739 Peripheral vascular disease, unspecified: Secondary | ICD-10-CM

## 2019-09-15 DIAGNOSIS — G4733 Obstructive sleep apnea (adult) (pediatric): Secondary | ICD-10-CM | POA: Diagnosis not present

## 2019-09-15 DIAGNOSIS — I714 Abdominal aortic aneurysm, without rupture, unspecified: Secondary | ICD-10-CM

## 2019-09-15 DIAGNOSIS — I499 Cardiac arrhythmia, unspecified: Secondary | ICD-10-CM

## 2019-09-15 DIAGNOSIS — Z9861 Coronary angioplasty status: Secondary | ICD-10-CM

## 2019-09-18 DIAGNOSIS — G4733 Obstructive sleep apnea (adult) (pediatric): Secondary | ICD-10-CM | POA: Insufficient documentation

## 2019-09-18 NOTE — Progress Notes (Signed)
DIAGNOSIS:  1. Obstructive Sleep Apnea responded well to only 7 cm water PAP  pressure  2. Sleep Related Hypoxemia resolved.  3. Periodic Limb Movements were controlled under 7 cm water.   PLANS/RECOMMENDATIONS: Start autotitration CPAP with a setting  from 5-15 cm water, 3 cm EPR and heated humidification. The  patient was fitted with a Respironics DreamWear FFM medium mask

## 2019-09-18 NOTE — Addendum Note (Signed)
Addended by: Larey Seat on: 09/18/2019 07:12 PM   Modules accepted: Orders

## 2019-09-18 NOTE — Procedures (Signed)
PATIENT'S NAME:  Juan [name], Juan [name] DOB:      [birth year]      MR#:    [id number]     DATE OF RECORDING: 09/15/2019  AL REFERRING M.D.:  Jenna Luo, MD and Ophthalmologist Dr Rankin/ retinal specialist.  Study Performed:   CPAP  Titration HISTORY:  Pt. returned for CPAP titration following a PSG performed on 08/07/19, the patient was diagnosed with mild OSA at an AHI of 6.8/h , supine AHI was 37.5/h, REM AHI 26.4/h. Severe hypoxia during sleep with intermittent desaturation episodes of  90 seconds and spo2 Nadir of 84%. The patient endorsed the Epworth Sleepiness Scale at 4 points.   The patient's weight 247 pounds with a height of 70 (inches), resulting in a BMI of 35.3 kg/m2. The patient's neck circumference measured 18.5 inches.  CURRENT MEDICATIONS: Zyloprim, Aspirin, Cardizem, Cardura, Glucotrol, Zestril, Toprol, Multivitamins, fish oil, Protonix, Crestor, Januvia,  PROCEDURE:  This is a multichannel digital polysomnogram utilizing the SomnoStar 11.2 system.  Electrodes and sensors were applied and monitored per AASM Specifications.   EEG, EOG, Chin and Limb EMG, were sampled at 200 Hz.  ECG, Snore and Nasal Pressure, Thermal Airflow, Respiratory Effort, CPAP Flow and Pressure, Oximetry was sampled at 50 Hz. Digital video and audio were recorded.      The patient was fitted with a Respironics DreamWear FFM medium mask. CPAP was initiated at 5 cmH20 with heated humidity per AASM split night standards and pressure was advanced to 7cm H20 because of hypopneas, apneas and desaturations.  At a PAP pressure of 7 cmH20, there was a reduction of the AHI to 0 with improvement of sleep apnea and nadir of 90% SpO2.   Lights Out was at 22:36 and Lights On at 05:00. Total recording time (TRT) was 384.5 minutes, with a total sleep time (TST) of 213.5 minutes. The patient's sleep latency was 66 minutes. REM latency was 164 minutes.  The sleep efficiency was 55.5 %.    SLEEP ARCHITECTURE: WASO (Wake after  sleep onset) was 146 minutes.  There were 30.5 minutes in Stage N1, 96.5 minutes Stage N2, 74.5 minutes Stage N3 and 12 minutes in Stage REM.  The percentage of Stage N1 was 14.3%, Stage N2 was 45.2%, Stage N3 was 34.9% and Stage R (REM sleep) was 5.6%.     RESPIRATORY ANALYSIS:  There was a total of 0 respiratory events: 0 obstructive apneas, 0 central apneas and 0 mixed apneas with a total of 0 apneas and an apnea index (AI) of 0 /hour. There were 0 hypopneas with a hypopnea index of 0/hour. The patient also had 0 respiratory event related arousals (RERAs).      The total APNEA/HYPOPNEA INDEX  (AHI) was 0 /hour and the total RESPIRATORY DISTURBANCE INDEX was 0 /hour  0 events occurred in REM sleep and 0 events in NREM. The REM AHI was 0 /hour versus a non-REM AHI of 0 /hour.  The patient spent 0 minutes of total sleep time in the supine position and 214 minutes in non-supine. The supine AHI was 0.0, versus a non-supine AHI of 0.0.  OXYGEN SATURATION & C02:  The baseline 02 saturation was 97%, with the lowest being 90%. Time spent below 89% saturation equaled 0 minutes.  The arousals were noted as: 23 were spontaneous, 20 were associated with PLMs, 0 were associated with respiratory events. Sleep was highly fragmented.  The patient had a total of 75 Periodic Limb Movements. The Periodic Limb Movement (PLM) index was 21.1  and the PLM Arousal index was 5.6 /hour. The patient took bathroom breaks. Snoring was noted. EKG documented irregular heart rate, many PVCs,    DIAGNOSIS: 1. Obstructive Sleep Apnea responded well to only 7 cm water PAP pressure 2. Sleep Related Hypoxemia resolved.  3. Periodic Limb Movements were controlled under 7 cm water.   PLANS/RECOMMENDATIONS: Start autotitration CPAP with a setting from 5-15 cm water, 3 cm EPR and heated humidification. The patient was fitted with a Respironics DreamWear FFM medium mask   DISCUSSION:A follow up appointment will be scheduled in the  Sleep Clinic at [facility].   Please call [phone] with any questions.      I certify that I have reviewed the entire raw data recording prior to the issuance of this report in accordance with the Standards of Accreditation of the American Academy of Sleep Medicine (AASM)    Larey Seat, M.D. Diplomat, Tax adviser of Psychiatry and Neurology  Diplomat, Tax adviser of Sleep Medicine Market researcher, [employer] [facility] at [employer]

## 2019-09-19 ENCOUNTER — Telehealth: Payer: Self-pay | Admitting: Neurology

## 2019-09-19 NOTE — Telephone Encounter (Signed)
-----   Message from Larey Seat, MD sent at 09/18/2019  7:12 PM EDT ----- DIAGNOSIS:  1. Obstructive Sleep Apnea responded well to only 7 cm water PAP  pressure  2. Sleep Related Hypoxemia resolved.  3. Periodic Limb Movements were controlled under 7 cm water.   PLANS/RECOMMENDATIONS: Start autotitration CPAP with a setting  from 5-15 cm water, 3 cm EPR and heated humidification. The  patient was fitted with a Respironics DreamWear FFM medium mask

## 2019-09-19 NOTE — Telephone Encounter (Signed)
I called pt. I advised pt that Dr. Brett Fairy reviewed their sleep study results and found that pt has sleep apnea best treated with CPAP. Dr. Brett Fairy recommends that pt starts auto CPAP. I reviewed PAP compliance expectations with the pt. Pt is agreeable to starting a CPAP. I advised pt that an order will be sent to a DME, [facility] and [facility] will call the pt within about one week after they file with the pt's insurance. [facility]) will show the pt how to use the machine, fit for masks, and troubleshoot the CPAP if needed. A follow up appt was made for insurance purposes with Dr. Brett Fairy on 11/26/19 at 2:30 pm. Pt verbalized understanding to arrive 15 minutes early and bring their CPAP. A letter with all of this information in it will be mailed to the pt as a reminder. I verified with the pt that the address we have on file is correct. Pt verbalized understanding of results. Pt had no questions at this time but was encouraged to call back if questions arise. I have sent the order to [facility]) and have received confirmation that they have received the order.

## 2019-10-06 ENCOUNTER — Ambulatory Visit (INDEPENDENT_AMBULATORY_CARE_PROVIDER_SITE_OTHER): Payer: Medicare HMO | Admitting: Ophthalmology

## 2019-10-06 ENCOUNTER — Other Ambulatory Visit: Payer: Self-pay

## 2019-10-06 ENCOUNTER — Encounter (INDEPENDENT_AMBULATORY_CARE_PROVIDER_SITE_OTHER): Payer: Self-pay | Admitting: Ophthalmology

## 2019-10-06 DIAGNOSIS — H35352 Cystoid macular degeneration, left eye: Secondary | ICD-10-CM | POA: Diagnosis not present

## 2019-10-06 DIAGNOSIS — H35072 Retinal telangiectasis, left eye: Secondary | ICD-10-CM | POA: Insufficient documentation

## 2019-10-06 MED ORDER — KETOROLAC TROMETHAMINE 0.5 % OP SOLN: 1.0000 [drp] | Freq: Two times a day (BID) | OPHTHALMIC | 4 refills | Status: DC

## 2019-10-06 NOTE — Patient Instructions (Signed)
Patient should continue on topical drops twice daily in the left eye

## 2019-10-06 NOTE — Progress Notes (Signed)
10/06/2019     CHIEF COMPLAINT Patient presents for Retina Follow Up   HISTORY OF PRESENT ILLNESS: Juan [name] is a 79 y.o. male who presents to the clinic today for:   HPI    Retina Follow Up    Patient presents with  Other.  In left eye.  This started 6 weeks ago.  Severity is mild.  Duration of 6 weeks.  Since onset it is stable.          Comments    6 Week F/U OU  Pt denies noticeable changes to [place] OS on Ketorolac drops, but sts, "doesn't seem to bother me any though." Pt denies changes to [place] OD. No new symptoms OU. Pt is using Ketorolac QID OS.  No change in visual acuity per patient.  Patient's sleep study did not disclose mild AHI count during non-REM sleep but severe during REM sleep associate with cardiac dysrhythmias.  Treatment was recommended back to [place].  I have explained to the patient that I have seen this type of macular edema improve also with the use of CPAP at night in addition to potential overall sense of wellbeing improvement       Last edited by Hurman Horn, MD on 10/06/2019  3:18 PM. (History)      Referring physician: Susy Frizzle, MD [address] [place],  [place] [address]  HISTORICAL INFORMATION:   Selected notes from the MEDICAL RECORD NUMBER    Lab Results  Component Value Date   HGBA1C 7.2 (H) 05/12/2019     CURRENT MEDICATIONS: Current Outpatient Medications (Ophthalmic Drugs)  Medication Sig  . ketorolac (ACULAR) 0.5 % ophthalmic solution Place 1 drop into the left eye 2 (two) times daily.   No current facility-administered medications for this visit. (Ophthalmic Drugs)   Current Outpatient Medications (Other)  Medication Sig  . allopurinol (ZYLOPRIM) 300 MG tablet TAKE 1 TABLET EVERY DAY  . aspirin EC 81 MG tablet Take 81 mg by mouth daily.  . Blood Glucose Monitoring Suppl (TRUE METRIX METER) w/Device KIT Use as Directed  . diltiazem (CARDIZEM CD) 240 MG 24 hr capsule TAKE 1 CAPSULE EVERY DAY  .  doxazosin (CARDURA) 4 MG tablet TAKE 1 TABLET EVERY DAY  . glipiZIDE (GLUCOTROL) 5 MG tablet TAKE 1 TABLET TWO TIMES DAILY BEFORE  MEALS  . glucose blood (TRUE METRIX BLOOD GLUCOSE TEST) test strip Check BS QD - dx: e11.9  . Lancet Devices (PRODIGY LANCING DEVICE) MISC Use bid DX e11.9  . lisinopril (ZESTRIL) 40 MG tablet TAKE 1 TABLET EVERY DAY  . metoprolol succinate (TOPROL-XL) 25 MG 24 hr tablet Take 1 tablet (25 mg total) by mouth daily.  . Multiple Vitamin (MULTIVITAMIN WITH MINERALS) TABS Take 1 tablet by mouth daily.  . Omega-3 Fatty Acids (FISH OIL PO) Take by mouth.  . pantoprazole (PROTONIX) 20 MG tablet Take 1 tablet (20 mg total) by mouth daily.  . rosuvastatin (CRESTOR) 20 MG tablet TAKE 1 TABLET (20 MG TOTAL) BY MOUTH DAILY.  . sitaGLIPtin (JANUVIA) 100 MG tablet Take 1 tablet (100 mg total) by mouth daily.  Marland Kitchen sulfamethoxazole-trimethoprim (BACTRIM DS) 800-160 MG tablet Take 1 tablet by mouth 2 (two) times daily.  . TRUEPLUS LANCETS 33G MISC Check BS QD - dx: e11.9   No current facility-administered medications for this visit. (Other)      REVIEW OF SYSTEMS:    ALLERGIES Allergies  Allergen Reactions  . Jardiance [Empagliflozin]  Severe nausea-     PAST MEDICAL HISTORY Past Medical History:  Diagnosis Date  . AAA (abdominal aortic aneurysm) ([place]) 8/12   3.5cm  . CAD (coronary artery disease)   . CKD (chronic kidney disease) stage 3, GFR 30-59 ml/min   . Colon polyps   . Diabetes mellitus without complication ([place])   . 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 Hyperlipidemia   . Hypertension   . 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 Neuromuscular disorder ([place])    L2-3,L5-S1 bulging disc /nerve impingement  . PAD (peripheral artery disease) ([place])    Past Surgical History:  Procedure Laterality Date  . COLONOSCOPY N/A 09/27/2012   Procedure: COLONOSCOPY;  Surgeon: Daneil Dolin, MD;  Location:  [facility];  Service: Endoscopy;  Laterality: N/A;  9:30  . CORONARY ANGIOPLASTY     1989  . TONSILLECTOMY      FAMILY HISTORY Family History  Problem Relation Age of Onset  . Diabetes Mother   . Heart disease Mother        After age 3  . Heart attack Mother   . Hypertension Mother   . Diabetes Father   . Heart disease Father        After age 43  . Hypertension Father   . Heart attack Father   . Brain cancer Sister   . Cancer Sister        Brain  . Diabetes Sister   . Hypertension Sister   . Cancer Brother        Chest Tumor  . Diabetes Brother   . Hypertension Brother   . Deep vein thrombosis Sister   . Diabetes Sister   . Diabetes Brother        Bilateral leg  . Colon cancer Neg Hx     SOCIAL HISTORY Social History   Tobacco Use  . Smoking status: Former Smoker    Years: 35.00    Types: Cigarettes    Quit date: 01/02/1988    Years since quitting: 31.7  . Smokeless tobacco: Never Used  Vaping Use  . Vaping Use: Never used  Substance Use Topics  . Alcohol use: Not Currently    Comment: rare beer, no history of ETOH abuse  . Drug use: No         OPHTHALMIC EXAM:  Base Eye Exam    Visual Acuity (ETDRS)      Right Left   Dist [place] 20/25 +1 20/80 -3   Dist ph [place]  NI       Tonometry (Tonopen, 2:24 PM)      Right Left   Pressure 10 12       Pupils      Pupils Dark Light Shape React APD   Right PERRL 2 2 Round Minimal None   Left PERRL 2 2 Round Minimal None       Visual Fields (Counting fingers)      Left Right    Full Full       Extraocular Movement      Right Left    Full Full       Neuro/Psych    Oriented x3: Yes   Mood/Affect: Normal       Dilation    Both eyes: 1.0% Mydriacyl, 2.5% Phenylephrine @ 2:24 PM        Slit Lamp and Fundus Exam    External Exam  Right Left   External Normal Normal       Slit Lamp Exam      Right Left   Lids/Lashes Normal Normal   Conjunctiva/Sclera White and quiet White and quiet     Cornea Clear Clear   Anterior Chamber Deep and quiet Deep and quiet   Iris Round and reactive Round and reactive   Lens Posterior chamber intraocular lens Posterior chamber intraocular lens   Anterior Vitreous Normal Normal       Fundus Exam      Right Left   Posterior Vitreous Normal Normal   Disc Normal Normal   C/D Ratio 0.15 0.2   Macula Epiretinal membrane, no topo distortion ,, no clinical CME, 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 Progression has been stable. Findings include epiretinal membrane, cystoid macular edema.   Left Eye Central Foveal Thickness: 316. Progression has been stable. Findings include cystoid macular edema, vitreomacular adhesion .   Notes OD with minor topographic distortion and only minor perifoveal CME, not visually significant.                ASSESSMENT/PLAN:  No problem-specific Assessment & Plan notes found for this encounter.      ICD-10-CM   1. Cystoid macular edema of left eye  H35.352 OCT, Retina - OU - Both Eyes  2. 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  Courage patient to stay on his topical drops left eye to maintain standard treatment of CME, yet I do believe that this might be retinal telangiectasis which might in fact improve with nightly CPAP, and its attendant improved macular oxygenation  3.  Patient currently scheduled to return follow-up in September for CPAP instruction and/or beginning use.  I have encouraged him to attempt to begin this sooner  4.  I have encouraged patient to begin CPAP to slow-degeneration in  addition to improving macular oxygenation  Ophthalmic Meds Ordered this visit:  Meds ordered this encounter  Medications  . ketorolac (ACULAR) 0.5 % ophthalmic solution    Sig: Place 1 drop into the left eye 2 (two) times daily.    Dispense:  5 mL    Refill:  4       Return in about 8 weeks (around 12/01/2019) for DILATE OU, OCT.  Patient Instructions  Patient should continue on topical drops twice daily in the left eye    Explained the diagnoses, plan, and follow up with the patient and they expressed understanding.  Patient expressed understanding of the importance of proper follow up care.   Clent Demark Uyen Eichholz M.D. Diseases & Surgery of the Retina and Vitreous Retina & Diabetic [place] 10/06/19     Abbreviations: M myopia (nearsighted); A astigmatism; H hyperopia (farsighted); P presbyopia; Mrx spectacle prescription;  CTL contact lenses; OD right eye; OS left eye; OU both eyes  XT exotropia; ET esotropia; PEK punctate epithelial keratitis; PEE punctate epithelial erosions; DES dry eye syndrome; MGD meibomian gland dysfunction; ATs artificial tears; PFAT's preservative free artificial  tears; [place] nuclear sclerotic cataract; PSC posterior subcapsular cataract; ERM epi-retinal membrane; PVD posterior vitreous detachment; RD retinal detachment; DM diabetes mellitus; DR diabetic retinopathy; NPDR non-proliferative diabetic retinopathy; PDR proliferative diabetic retinopathy; CSME clinically significant macular edema; DME diabetic macular edema; dbh dot blot hemorrhages; CWS cotton wool spot; POAG primary open angle glaucoma; C/D cup-to-disc ratio; HVF humphrey visual field; GVF goldmann visual field; OCT optical coherence tomography; IOP intraocular pressure; BRVO Branch retinal vein occlusion; CRVO central retinal vein occlusion; CRAO central retinal artery occlusion; BRAO branch retinal artery occlusion; RT retinal tear; SB scleral buckle; PPV pars plana vitrectomy; VH Vitreous  hemorrhage; PRP panretinal laser photocoagulation; IVK intravitreal kenalog; VMT vitreomacular traction; MH Macular hole;  NVD neovascularization of the disc; NVE neovascularization elsewhere; AREDS age related eye disease study; ARMD age related macular degeneration; POAG primary open angle glaucoma; EBMD epithelial/anterior basement membrane dystrophy; ACIOL anterior chamber intraocular lens; IOL intraocular lens; PCIOL posterior chamber intraocular lens; Phaco/IOL phacoemulsification with intraocular lens placement; [place] photorefractive keratectomy; LASIK laser assisted in situ keratomileusis; HTN hypertension; DM diabetes mellitus; COPD chronic obstructive pulmonary disease

## 2019-10-14 DIAGNOSIS — G4733 Obstructive sleep apnea (adult) (pediatric): Secondary | ICD-10-CM | POA: Diagnosis not present

## 2019-10-15 ENCOUNTER — Encounter: Payer: Self-pay | Admitting: Family Medicine

## 2019-10-16 ENCOUNTER — Other Ambulatory Visit: Payer: Self-pay | Admitting: Family Medicine

## 2019-10-20 ENCOUNTER — Encounter: Payer: Self-pay | Admitting: Family Medicine

## 2019-10-20 ENCOUNTER — Ambulatory Visit (INDEPENDENT_AMBULATORY_CARE_PROVIDER_SITE_OTHER): Payer: Medicare HMO | Admitting: Family Medicine

## 2019-10-20 ENCOUNTER — Other Ambulatory Visit: Payer: Self-pay

## 2019-10-20 VITALS — BP 150/78 | HR 84 | Temp 98.5°F | Ht 70.5 in | Wt 262.0 lb

## 2019-10-20 DIAGNOSIS — R0609 Other forms of dyspnea: Secondary | ICD-10-CM

## 2019-10-20 DIAGNOSIS — R0600 Dyspnea, unspecified: Secondary | ICD-10-CM

## 2019-10-20 DIAGNOSIS — M7989 Other specified soft tissue disorders: Secondary | ICD-10-CM

## 2019-10-20 MED ORDER — FUROSEMIDE 40 MG PO TABS: 40.0000 mg | ORAL_TABLET | Freq: Every day | ORAL | 3 refills | Status: DC

## 2019-10-20 NOTE — Progress Notes (Signed)
Subjective:    Patient ID: Juan [name], male    DOB: [birth year], 79 y.o.   MRN: [id number]  Patient presents today with a 1 week history of leg swelling.  He has tense +2 pitting edema in both legs up to the knee.  He also reports increasing dyspnea on exertion and shortness of breath.  Had an echocardiogram in 2019 that revealed an ejection fraction of 65% with calcifications of the aortic valve but no stenosis.  Today on exam he has a 2/6 to 3/6 systolic ejection murmur heard best over the aortic valve.  He also has faint bibasilar crackles.  Wife states that he is becoming easily winded simply shaving or walking to the mailbox.  Patient had a stress test in 2019 that revealed no ischemia. Past Medical History:  Diagnosis Date  . AAA (abdominal aortic aneurysm) ([place]) 8/12   3.5cm  . CAD (coronary artery disease)   . CKD (chronic kidney disease) stage 3, GFR 30-59 ml/min   . Colon polyps   . Diabetes mellitus without complication ([place])   . 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 Hyperlipidemia   . Hypertension   . 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 Neuromuscular disorder ([place])    L2-3,L5-S1 bulging disc /nerve impingement  . PAD (peripheral artery disease) ([place])    Past Surgical History:  Procedure Laterality Date  . COLONOSCOPY N/A 09/27/2012   Procedure: COLONOSCOPY;  Surgeon: Daneil Dolin, MD;  Location: [facility];  Service: Endoscopy;  Laterality: N/A;  9:30  . CORONARY ANGIOPLASTY     1989  . TONSILLECTOMY     Current Outpatient Medications on File Prior to Visit  Medication Sig Dispense Refill  . allopurinol (ZYLOPRIM) 300 MG tablet TAKE 1 TABLET EVERY DAY 90 tablet 3  . aspirin EC 81 MG tablet Take 81 mg by mouth daily.    . Blood Glucose Monitoring Suppl (TRUE METRIX METER) w/Device KIT Use as Directed 1 kit 1  . diltiazem (CARDIZEM CD) 240 MG 24 hr capsule TAKE 1 CAPSULE EVERY DAY 90 capsule 1  .  doxazosin (CARDURA) 4 MG tablet TAKE 1 TABLET EVERY DAY 90 tablet 2  . glipiZIDE (GLUCOTROL) 5 MG tablet TAKE 1 TABLET TWO TIMES DAILY BEFORE  MEALS 180 tablet 2  . glucose blood (TRUE METRIX BLOOD GLUCOSE TEST) test strip Check BS QD - dx: e11.9 100 each 12  . ketorolac (ACULAR) 0.5 % ophthalmic solution Place 1 drop into the left eye 2 (two) times daily. 5 mL 4  . Lancet Devices (PRODIGY LANCING DEVICE) MISC Use bid DX e11.9 1 each 2  . lisinopril (ZESTRIL) 40 MG tablet TAKE 1 TABLET EVERY DAY 90 tablet 3  . metoprolol succinate (TOPROL-XL) 25 MG 24 hr tablet Take 1 tablet (25 mg total) by mouth daily. 90 tablet 3  . Multiple Vitamin (MULTIVITAMIN WITH MINERALS) TABS Take 1 tablet by mouth daily.    . Omega-3 Fatty Acids (FISH OIL PO) Take by mouth.    . pantoprazole (PROTONIX) 20 MG tablet Take 1 tablet (20 mg total) by mouth daily. 30 tablet 5  . rosuvastatin (CRESTOR) 20 MG tablet TAKE 1 TABLET (20 MG TOTAL) BY MOUTH DAILY. 90 tablet 3  . sitaGLIPtin (JANUVIA) 100 MG tablet Take 1 tablet (100 mg total) by mouth daily. 90 tablet 1  . TRUEPLUS LANCETS 33G MISC Check BS QD - dx: e11.9 100 each 3  No current facility-administered medications on file prior to visit.   Allergies  Allergen Reactions  . Jardiance [Empagliflozin]     Severe nausea-    Social History   Socioeconomic History  . Marital status: Married    Spouse name: Not on file  . Number of children: Not on file  . Years of education: Not on file  . Highest education level: Not on file  Occupational History  . Occupation: retired    Fish farm manager: RETIRED    Comment: [employer]   Tobacco Use  . Smoking status: Former Smoker    Years: 35.00    Types: Cigarettes    Quit date: 01/02/1988    Years since quitting: 31.8  . Smokeless tobacco: Never Used  Vaping Use  . Vaping Use: Never used  Substance and Sexual Activity  . Alcohol use: Not Currently    Comment: rare beer, no history of ETOH abuse  . Drug use: No  .  Sexual activity: Not on file  Other Topics Concern  . Not on file  Social History Narrative  . Not on file   Social Determinants of Health   Financial Resource Strain:   . Difficulty of Paying Living Expenses:   Food Insecurity:   . Worried About Charity fundraiser in the Last Year:   . Arboriculturist in the Last Year:   Transportation Needs:   . Film/video editor (Medical):   Marland Kitchen Lack of Transportation (Non-Medical):   Physical Activity:   . Days of Exercise per Week:   . Minutes of Exercise per Session:   Stress:   . Feeling of Stress :   Social Connections:   . Frequency of Communication with Friends and Family:   . Frequency of Social Gatherings with Friends and Family:   . Attends Religious Services:   . Active Member of Clubs or Organizations:   . Attends Archivist Meetings:   Marland Kitchen Marital Status:   Intimate Partner Violence:   . Fear of Current or Ex-Partner:   . Emotionally Abused:   Marland Kitchen Physically Abused:   . Sexually Abused:    Family History  Problem Relation Age of Onset  . Diabetes Mother   . Heart disease Mother        After age 48  . Heart attack Mother   . Hypertension Mother   . Diabetes Father   . Heart disease Father        After age 18  . Hypertension Father   . Heart attack Father   . Brain cancer Sister   . Cancer Sister        Brain  . Diabetes Sister   . Hypertension Sister   . Cancer Brother        Chest Tumor  . Diabetes Brother   . Hypertension Brother   . Deep vein thrombosis Sister   . Diabetes Sister   . Diabetes Brother        Bilateral leg  . Colon cancer Neg Hx       Review of Systems  All other systems reviewed and are negative.      Objective:   Physical Exam Vitals reviewed.  Constitutional:      General: He is not in acute distress.    Appearance: He is well-developed. He is not diaphoretic.  HENT:     Head: Normocephalic and atraumatic.     Right Ear: External ear normal.     Left Ear:  External ear normal.     Nose: Nose normal.     Mouth/Throat:     Pharynx: No oropharyngeal exudate.  Eyes:     General: No scleral icterus.       Right eye: No discharge.        Left eye: No discharge.     Conjunctiva/sclera: Conjunctivae normal.     Pupils: Pupils are equal, round, and reactive to light.  Neck:     Thyroid: No thyromegaly.     Vascular: No JVD.     Trachea: No tracheal deviation.  Cardiovascular:     Rate and Rhythm: Normal rate and regular rhythm.     Heart sounds: Murmur heard.  No friction rub. No gallop.   Pulmonary:     Effort: Pulmonary effort is normal. No respiratory distress.     Breath sounds: Normal breath sounds. No stridor. No wheezing or rales.  Chest:     Chest wall: No tenderness.  Abdominal:     General: Bowel sounds are normal. There is no distension.     Palpations: Abdomen is soft. There is no mass.     Tenderness: There is no abdominal tenderness. There is no guarding or rebound.  Musculoskeletal:        General: No tenderness. Normal range of motion.     Cervical back: Normal range of motion and neck supple.     Right lower leg: Edema present.     Left lower leg: Edema present.  Lymphadenopathy:     Cervical: No cervical adenopathy.  Skin:    General: Skin is warm.     Coloration: Skin is not pale.     Findings: No erythema or rash.  Neurological:     Mental Status: He is alert and oriented to person, place, and time.     Cranial Nerves: No cranial nerve deficit.     Motor: No abnormal muscle tone.     Coordination: Coordination normal.     Deep Tendon Reflexes: Reflexes are normal and symmetric.  Psychiatric:        Behavior: Behavior normal.        Thought Content: Thought content normal.        Judgment: Judgment normal.           Assessment & Plan:   Leg swelling - Plan: Brain natriuretic peptide, COMPLETE METABOLIC PANEL WITH GFR, CBC with Differential/Platelet  DOE (dyspnea on exertion)  Begin Lasix 40 mg a  day.  Check BMP and BNP today to evaluate for any liver or kidney issues.  Reassess in 1 week or sooner if worsening.  Schedule patient for an echocardiogram to evaluate further.

## 2019-10-21 ENCOUNTER — Other Ambulatory Visit: Payer: Self-pay

## 2019-10-21 DIAGNOSIS — D509 Iron deficiency anemia, unspecified: Secondary | ICD-10-CM

## 2019-10-21 LAB — COMPLETE METABOLIC PANEL WITH GFR
AG Ratio: 1.6 (calc) (ref 1.0–2.5)
ALT: 11 U/L (ref 9–46)
AST: 13 U/L (ref 10–35)
Albumin: 3.9 g/dL (ref 3.6–5.1)
Alkaline phosphatase (APISO): 83 U/L (ref 35–144)
BUN/Creatinine Ratio: 9 (calc) (ref 6–22)
BUN: 15 mg/dL (ref 7–25)
CO2: 25 mmol/L (ref 20–32)
Calcium: 9.1 mg/dL (ref 8.6–10.3)
Chloride: 105 mmol/L (ref 98–110)
Creat: 1.6 mg/dL — ABNORMAL HIGH (ref 0.70–1.11)
GFR, Est African American: 46 mL/min/{1.73_m2} — ABNORMAL LOW (ref >=60)
GFR, Est Non African American: 40 mL/min/{1.73_m2} — ABNORMAL LOW (ref >=60)
Globulin: 2.5 g/dL (calc) (ref 1.9–3.7)
Glucose, Bld: 195 mg/dL — ABNORMAL HIGH (ref 65–99)
Potassium: 4.3 mmol/L (ref 3.5–5.3)
Sodium: 140 mmol/L (ref 135–146)
Total Bilirubin: 0.3 mg/dL (ref 0.2–1.2)
Total Protein: 6.4 g/dL (ref 6.1–8.1)

## 2019-10-21 LAB — CBC WITH DIFFERENTIAL/PLATELET
Absolute Monocytes: 728 cells/uL (ref 200–950)
Basophils Absolute: 23 cells/uL (ref 0–200)
Basophils Relative: 0.3 %
Eosinophils Absolute: 98 cells/uL (ref 15–500)
Eosinophils Relative: 1.3 %
HCT: 31.2 % — ABNORMAL LOW (ref 38.5–50.0)
Hemoglobin: 10.3 g/dL — ABNORMAL LOW (ref 13.2–17.1)
Lymphs Abs: 1238 cells/uL (ref 850–3900)
MCH: 31.3 pg (ref 27.0–33.0)
MCHC: 33 g/dL (ref 32.0–36.0)
MCV: 94.8 fL (ref 80.0–100.0)
MPV: 10 fL (ref 7.5–12.5)
Monocytes Relative: 9.7 %
Neutro Abs: 5415 cells/uL (ref 1500–7800)
Neutrophils Relative %: 72.2 %
Platelets: 127 10*3/uL — ABNORMAL LOW (ref 140–400)
RBC: 3.29 10*6/uL — ABNORMAL LOW (ref 4.20–5.80)
RDW: 13.4 % (ref 11.0–15.0)
Total Lymphocyte: 16.5 %
WBC: 7.5 10*3/uL (ref 3.8–10.8)

## 2019-10-21 LAB — BRAIN NATRIURETIC PEPTIDE: Brain Natriuretic Peptide: 168 pg/mL — ABNORMAL HIGH (ref <100)

## 2019-10-22 ENCOUNTER — Other Ambulatory Visit: Payer: Self-pay

## 2019-10-22 ENCOUNTER — Telehealth: Payer: Self-pay | Admitting: Family Medicine

## 2019-10-22 ENCOUNTER — Other Ambulatory Visit: Payer: Medicare HMO

## 2019-10-22 DIAGNOSIS — D509 Iron deficiency anemia, unspecified: Secondary | ICD-10-CM | POA: Diagnosis not present

## 2019-10-22 NOTE — Progress Notes (Signed)
   Chronic Care Management   Note  10/22/2019 Name: Rapheal Masso MRN: [id number] DOB: June 16, 1940  Dominiq Fontaine is a 79 y.o. year old male who is a primary care patient of Susy Frizzle, MD. I reached out to [name] by phone today in response to a referral sent by Mr. Woody Kronberg PCP, Susy Frizzle, MD.   Mr. Orman was given information about Chronic Care Management services today including:  1. CCM service includes personalized support from designated clinical staff supervised by his physician, including individualized plan of care and coordination with other care providers 2. 24/7 contact phone numbers for assistance for urgent and routine care needs. 3. Service will only be billed when office clinical staff spend 20 minutes or more in a month to coordinate care. 4. Only one practitioner may furnish and bill the service in a calendar month. 5. The patient may stop CCM services at any time (effective at the end of the month) by phone call to the office staff.   Patient wishes to consider information provided and/or speak with a member of the care team before deciding about enrollment in care management services.   Follow up plan:   Carley Perdue [employer] Scheduler

## 2019-10-23 ENCOUNTER — Other Ambulatory Visit: Payer: Self-pay

## 2019-10-23 LAB — IRON: Iron: 57 ug/dL (ref 50–180)

## 2019-10-25 ENCOUNTER — Other Ambulatory Visit: Payer: Self-pay | Admitting: Family Medicine

## 2019-10-26 DIAGNOSIS — D509 Iron deficiency anemia, unspecified: Secondary | ICD-10-CM | POA: Diagnosis not present

## 2019-10-28 ENCOUNTER — Other Ambulatory Visit: Payer: Self-pay

## 2019-10-28 ENCOUNTER — Ambulatory Visit (INDEPENDENT_AMBULATORY_CARE_PROVIDER_SITE_OTHER): Payer: Medicare HMO | Admitting: Family Medicine

## 2019-10-28 VITALS — BP 114/60 | HR 70 | Temp 96.0°F | Ht 70.0 in | Wt 257.0 lb

## 2019-10-28 DIAGNOSIS — R0609 Other forms of dyspnea: Secondary | ICD-10-CM

## 2019-10-28 DIAGNOSIS — R0600 Dyspnea, unspecified: Secondary | ICD-10-CM | POA: Diagnosis not present

## 2019-10-28 DIAGNOSIS — M7989 Other specified soft tissue disorders: Secondary | ICD-10-CM

## 2019-10-28 DIAGNOSIS — I5021 Acute systolic (congestive) heart failure: Secondary | ICD-10-CM | POA: Diagnosis not present

## 2019-10-28 DIAGNOSIS — D509 Iron deficiency anemia, unspecified: Secondary | ICD-10-CM | POA: Diagnosis not present

## 2019-10-28 MED ORDER — POTASSIUM CHLORIDE CRYS ER 20 MEQ PO TBCR: 20.0000 meq | EXTENDED_RELEASE_TABLET | Freq: Every day | ORAL | 3 refills | Status: DC

## 2019-10-28 NOTE — Progress Notes (Signed)
Wt Readings from Last 3 Encounters:  10/28/19 262 lb (118.8 kg)  10/20/19 262 lb (118.8 kg)  08/15/19 249 lb (112.9 kg)     Subjective:    Patient ID: Juan [name], male    DOB: 1940/05/01, 79 y.o.   MRN: [id number] 10/20/19 Patient presents today with a 1 week history of leg swelling.  He has tense +2 pitting edema in both legs up to the knee.  He also reports increasing dyspnea on exertion and shortness of breath.  Had an echocardiogram in 2019 that revealed an ejection fraction of 65% with calcifications of the aortic valve but no stenosis.  Today on exam he has a 2/6 to 3/6 systolic ejection murmur heard best over the aortic valve.  He also has faint bibasilar crackles.  Wife states that he is becoming easily winded simply shaving or walking to the mailbox.  Patient had a stress test in 2019 that revealed no ischemia.  At that time, my plan was: Begin Lasix 40 mg a day.  Check BMP and BNP today to evaluate for any liver or kidney issues.  Reassess in 1 week or sooner if worsening.  Schedule patient for an echocardiogram to evaluate further.   10/28/19 Patient has lost 5 pounds since his last office visit.  He continues to have +1 pitting edema in both legs and shortness of breath with activity.  His echocardiogram is scheduled for later this week.  His BNP was elevated slightly at 160. Past Medical History:  Diagnosis Date  . AAA (abdominal aortic aneurysm) ([place]) 8/12   3.5cm  . CAD (coronary artery disease)   . CKD (chronic kidney disease) stage 3, GFR 30-59 ml/min   . Colon polyps   . Diabetes mellitus without complication ([place])   . 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 Hyperlipidemia   . Hypertension   . 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 Neuromuscular disorder ([place])    L2-3,L5-S1 bulging disc /nerve impingement  . PAD (peripheral artery disease) ([place])    Past Surgical History:  Procedure Laterality Date  .  COLONOSCOPY N/A 09/27/2012   Procedure: COLONOSCOPY;  Surgeon: Daneil Dolin, MD;  Location[facility];  Service: Endoscopy;  Laterality: N/A;  9:30  . CORONARY ANGIOPLASTY     1989  . TONSILLECTOMY     Current Outpatient Medications on File Prior to Visit  Medication Sig Dispense Refill  . allopurinol (ZYLOPRIM) 300 MG tablet TAKE 1 TABLET EVERY DAY 90 tablet 3  . aspirin EC 81 MG tablet Take 81 mg by mouth daily.    . Blood Glucose Monitoring Suppl (TRUE METRIX METER) w/Device KIT Use as Directed 1 kit 1  . diltiazem (CARDIZEM CD) 240 MG 24 hr capsule TAKE 1 CAPSULE EVERY DAY 90 capsule 1  . doxazosin (CARDURA) 4 MG tablet TAKE 1 TABLET EVERY DAY 90 tablet 2  . furosemide (LASIX) 40 MG tablet Take 1 tablet (40 mg total) by mouth daily. 30 tablet 3  . glipiZIDE (GLUCOTROL) 5 MG tablet TAKE 1 TABLET TWO TIMES DAILY BEFORE  MEALS 180 tablet 2  . glucose blood (TRUE METRIX BLOOD GLUCOSE TEST) test strip Check BS QD - dx: e11.9 100 each 12  . ketorolac (ACULAR) 0.5 % ophthalmic solution Place 1 drop into the left eye 2 (two) times daily. 5 mL 4  . Lancet Devices (PRODIGY LANCING DEVICE) MISC Use bid DX e11.9 1 each 2  .  lisinopril (ZESTRIL) 40 MG tablet TAKE 1 TABLET EVERY DAY 90 tablet 3  . metoprolol succinate (TOPROL-XL) 25 MG 24 hr tablet Take 1 tablet (25 mg total) by mouth daily. 90 tablet 3  . Multiple Vitamin (MULTIVITAMIN WITH MINERALS) TABS Take 1 tablet by mouth daily.    . Omega-3 Fatty Acids (FISH OIL PO) Take by mouth.    . pantoprazole (PROTONIX) 20 MG tablet Take 1 tablet (20 mg total) by mouth daily. 30 tablet 5  . rosuvastatin (CRESTOR) 20 MG tablet TAKE 1 TABLET (20 MG TOTAL) BY MOUTH DAILY. 90 tablet 3  . sitaGLIPtin (JANUVIA) 100 MG tablet Take 1 tablet (100 mg total) by mouth daily. 90 tablet 1  . TRUEPLUS LANCETS 33G MISC Check BS QD - dx: e11.9 100 each 3   No current facility-administered medications on file prior to visit.   Allergies  Allergen Reactions  .  Jardiance [Empagliflozin]     Severe nausea-    Social History   Socioeconomic History  . Marital status: Married    Spouse name: Not on file  . Number of children: Not on file  . Years of education: Not on file  . Highest education level: Not on file  Occupational History  . Occupation: retired    Fish farm manager: RETIRED    Comment: [employer]   Tobacco Use  . Smoking status: Former Smoker    Years: 35.00    Types: Cigarettes    Quit date: 01/02/1988    Years since quitting: 31.8  . Smokeless tobacco: Never Used  Vaping Use  . Vaping Use: Never used  Substance and Sexual Activity  . Alcohol use: Not Currently    Comment: rare beer, no history of ETOH abuse  . Drug use: No  . Sexual activity: Not on file  Other Topics Concern  . Not on file  Social History Narrative  . Not on file   Social Determinants of Health   Financial Resource Strain:   . Difficulty of Paying Living Expenses:   Food Insecurity:   . Worried About Charity fundraiser in the Last Year:   . Arboriculturist in the Last Year:   Transportation Needs:   . Film/video editor (Medical):   Marland Kitchen Lack of Transportation (Non-Medical):   Physical Activity:   . Days of Exercise per Week:   . Minutes of Exercise per Session:   Stress:   . Feeling of Stress :   Social Connections:   . Frequency of Communication with Friends and Family:   . Frequency of Social Gatherings with Friends and Family:   . Attends Religious Services:   . Active Member of Clubs or Organizations:   . Attends Archivist Meetings:   Marland Kitchen Marital Status:   Intimate Partner Violence:   . Fear of Current or Ex-Partner:   . Emotionally Abused:   Marland Kitchen Physically Abused:   . Sexually Abused:    Family History  Problem Relation Age of Onset  . Diabetes Mother   . Heart disease Mother        After age 61  . Heart attack Mother   . Hypertension Mother   . Diabetes Father   . Heart disease Father        After age 10  .  Hypertension Father   . Heart attack Father   . Brain cancer Sister   . Cancer Sister        Brain  . Diabetes Sister   .  Hypertension Sister   . Cancer Brother        Chest Tumor  . Diabetes Brother   . Hypertension Brother   . Deep vein thrombosis Sister   . Diabetes Sister   . Diabetes Brother        Bilateral leg  . Colon cancer Neg Hx       Review of Systems  All other systems reviewed and are negative.      Objective:   Physical Exam Vitals reviewed.  Constitutional:      General: He is not in acute distress.    Appearance: He is well-developed. He is not diaphoretic.  HENT:     Head: Normocephalic and atraumatic.     Right Ear: External ear normal.     Left Ear: External ear normal.     Nose: Nose normal.     Mouth/Throat:     Pharynx: No oropharyngeal exudate.  Eyes:     General: No scleral icterus.       Right eye: No discharge.        Left eye: No discharge.     Conjunctiva/sclera: Conjunctivae normal.     Pupils: Pupils are equal, round, and reactive to light.  Neck:     Thyroid: No thyromegaly.     Vascular: No JVD.     Trachea: No tracheal deviation.  Cardiovascular:     Rate and Rhythm: Normal rate and regular rhythm.     Heart sounds: Murmur heard.  No friction rub. No gallop.   Pulmonary:     Effort: Pulmonary effort is normal. No respiratory distress.     Breath sounds: Normal breath sounds. No stridor. No wheezing or rales.  Chest:     Chest wall: No tenderness.  Abdominal:     General: Bowel sounds are normal. There is no distension.     Palpations: Abdomen is soft. There is no mass.     Tenderness: There is no abdominal tenderness. There is no guarding or rebound.  Musculoskeletal:        General: No tenderness. Normal range of motion.     Cervical back: Normal range of motion and neck supple.     Right lower leg: Edema present.     Left lower leg: Edema present.  Lymphadenopathy:     Cervical: No cervical adenopathy.  Skin:     General: Skin is warm.     Coloration: Skin is not pale.     Findings: No erythema or rash.  Neurological:     Mental Status: He is alert and oriented to person, place, and time.     Cranial Nerves: No cranial nerve deficit.     Motor: No abnormal muscle tone.     Coordination: Coordination normal.     Deep Tendon Reflexes: Reflexes are normal and symmetric.  Psychiatric:        Behavior: Behavior normal.        Thought Content: Thought content normal.        Judgment: Judgment normal.           Assessment & Plan:   Iron deficiency anemia, unspecified iron deficiency anemia type - Plan: Fecal Globin By Immunochemistry  Acute systolic congestive heart failure (HCC) - Plan: BASIC METABOLIC PANEL WITH GFR  Leg swelling  DOE (dyspnea on exertion)  Hemoglobin had dropped from 12-10.3.  Therefore I will check the patient's stool for blood to determine if he has iron deficiency anemia, anemia of chronic kidney disease,  or anemia due to blood loss from a GI source.  However I suspect that he has systolic congestive heart failure based on his elevated BNP.  Increase Lasix to 80 mg a day and recheck next week.  Start K. Dur 20 mEq a day to prevent hypokalemia.  Check a BMP today.  Follow-up again next week.  Await the results of echocardiogram.

## 2019-10-29 LAB — BASIC METABOLIC PANEL WITH GFR
BUN/Creatinine Ratio: 10 (calc) (ref 6–22)
BUN: 19 mg/dL (ref 7–25)
CO2: 28 mmol/L (ref 20–32)
Calcium: 8.8 mg/dL (ref 8.6–10.3)
Chloride: 100 mmol/L (ref 98–110)
Creat: 1.81 mg/dL — ABNORMAL HIGH (ref 0.70–1.11)
GFR, Est African American: 40 mL/min/{1.73_m2} — ABNORMAL LOW (ref >=60)
GFR, Est Non African American: 34 mL/min/{1.73_m2} — ABNORMAL LOW (ref >=60)
Glucose, Bld: 270 mg/dL — ABNORMAL HIGH (ref 65–99)
Potassium: 3.9 mmol/L (ref 3.5–5.3)
Sodium: 137 mmol/L (ref 135–146)

## 2019-10-29 LAB — FECAL GLOBIN BY IMMUNOCHEMISTRY
FECAL GLOBIN RESULT:: NOT DETECTED
MICRO NUMBER:: 10836632
SPECIMEN QUALITY:: ADEQUATE

## 2019-10-30 ENCOUNTER — Ambulatory Visit (HOSPITAL_COMMUNITY)
Admission: RE | Admit: 2019-10-30 | Discharge: 2019-10-30 | Disposition: A | Discharge status: Home / Self Care | Payer: Medicare HMO | Source: Ambulatory Visit | Attending: Family Medicine | Admitting: Family Medicine

## 2019-10-30 DIAGNOSIS — M7989 Other specified soft tissue disorders: Secondary | ICD-10-CM | POA: Insufficient documentation

## 2019-10-30 DIAGNOSIS — R601 Generalized edema: Secondary | ICD-10-CM

## 2019-10-30 DIAGNOSIS — R0600 Dyspnea, unspecified: Secondary | ICD-10-CM | POA: Insufficient documentation

## 2019-10-30 DIAGNOSIS — R0609 Other forms of dyspnea: Secondary | ICD-10-CM

## 2019-10-30 LAB — ECHOCARDIOGRAM COMPLETE
AR max vel: 1.81 cm2
AV Area VTI: 1.76 cm2
AV Area mean vel: 1.91 cm2
AV Mean grad: 13 mmHg
AV Peak grad: 22.9 mmHg
Ao pk vel: 2.4 m/s
Area-P 1/2: 3.7 cm2
S' Lateral: 2.23 cm

## 2019-10-30 NOTE — Progress Notes (Signed)
*  PRELIMINARY RESULTS* Echocardiogram 2D Echocardiogram has been performed.  Juan [name] 10/30/2019, 12:44 PM

## 2019-11-04 ENCOUNTER — Ambulatory Visit (HOSPITAL_COMMUNITY)
Admission: RE | Admit: 2019-11-04 | Discharge: 2019-11-04 | Disposition: A | Discharge status: Home / Self Care | Payer: Medicare HMO | Source: Ambulatory Visit | Attending: Family Medicine | Admitting: Family Medicine

## 2019-11-04 ENCOUNTER — Other Ambulatory Visit: Payer: Self-pay

## 2019-11-04 ENCOUNTER — Ambulatory Visit (INDEPENDENT_AMBULATORY_CARE_PROVIDER_SITE_OTHER): Payer: Medicare HMO | Admitting: Family Medicine

## 2019-11-04 VITALS — BP 120/50 | HR 82 | Temp 97.8°F | Ht 70.0 in | Wt 250.0 lb

## 2019-11-04 DIAGNOSIS — R0602 Shortness of breath: Secondary | ICD-10-CM | POA: Insufficient documentation

## 2019-11-04 DIAGNOSIS — M7989 Other specified soft tissue disorders: Secondary | ICD-10-CM | POA: Diagnosis not present

## 2019-11-04 DIAGNOSIS — R0600 Dyspnea, unspecified: Secondary | ICD-10-CM

## 2019-11-04 DIAGNOSIS — J90 Pleural effusion, not elsewhere classified: Secondary | ICD-10-CM | POA: Diagnosis not present

## 2019-11-04 DIAGNOSIS — D509 Iron deficiency anemia, unspecified: Secondary | ICD-10-CM

## 2019-11-04 DIAGNOSIS — R0609 Other forms of dyspnea: Secondary | ICD-10-CM

## 2019-11-04 NOTE — Progress Notes (Signed)
Subjective:    Patient ID: Juan [name], male    DOB: 1941/02/11, 79 y.o.   MRN: [id number] 10/20/19 Patient presents today with a 1 week history of leg swelling.  He has tense +2 pitting edema in both legs up to the knee.  He also reports increasing dyspnea on exertion and shortness of breath.  Had an echocardiogram in 2019 that revealed an ejection fraction of 65% with calcifications of the aortic valve but no stenosis.  Today on exam he has a 2/6 to 3/6 systolic ejection murmur heard best over the aortic valve.  He also has faint bibasilar crackles.  Wife states that he is becoming easily winded simply shaving or walking to the mailbox.  Patient had a stress test in 2019 that revealed no ischemia.  At that time, my plan was: Begin Lasix 40 mg a day.  Check BMP and BNP today to evaluate for any liver or kidney issues.  Reassess in 1 week or sooner if worsening.  Schedule patient for an echocardiogram to evaluate further.   10/28/19 Patient has lost 5 pounds since his last office visit.  He continues to have +1 pitting edema in both legs and shortness of breath with activity.  His echocardiogram is scheduled for later this week.  His BNP was elevated slightly at 160.  At that time, my plan was: Hemoglobin had dropped from 12-10.3.  Therefore I will check the patient's stool for blood to determine if he has iron deficiency anemia, anemia of chronic kidney disease, or anemia due to blood loss from a GI source.  However I suspect that he has systolic congestive heart failure based on his elevated BNP.  Increase Lasix to 80 mg a day and recheck next week.  Start K. Dur 20 mEq a day to prevent hypokalemia.  Check a BMP today.  Follow-up again next week.  Await the results of echocardiogram.  11/04/19 Patient states that his dyspnea has not improved despite aggressive diuresis.  He is taking Lasix 80 mg a day.  Unfortunately, his creatinine had risen from 1.6-1.8 at his last visit.  Since I last saw the  patient he has lost significant weight through diuresis: Wt Readings from Last 3 Encounters:  11/04/19 250 lb (113.4 kg)  10/28/19 257 lb (116.6 kg)  10/20/19 262 lb (118.8 kg)   Therefore I am concerned about prerenal azotemia.  Furthermore the patient has not seen any significant benefit in his dyspnea on exertion.  His echocardiogram showed an ejection fraction of 65% with mild left ventricular hypertrophy but otherwise was unremarkable.  Therefore I believe the leg swelling is likely due to diastolic dysfunction exacerbated by his untreated obstructive sleep apnea.  There is no evidence of acute congestive heart failure on his echocardiogram.  He had a stress test performed in 2019 that showed no ischemia.  However I do not have an explanation for his dyspnea on exertion.  He has not had a chest x-ray.  He did smoke heavily from [phone].  However he has never been diagnosed with COPD Past Medical History:  Diagnosis Date  . AAA (abdominal aortic aneurysm) ([place]) 8/12   3.5cm  . CAD (coronary artery disease)   . CKD (chronic kidney disease) stage 3, GFR 30-59 ml/min   . Colon polyps   . Diabetes mellitus without complication ([place])   . 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 Hyperlipidemia   . Hypertension   . 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 Neuromuscular disorder ([place])    L2-3,L5-S1 bulging disc /nerve impingement  . PAD (peripheral artery disease) ([place])    Past Surgical History:  Procedure Laterality Date  . COLONOSCOPY N/A 09/27/2012   Procedure: COLONOSCOPY;  Surgeon: Daneil Dolin, MD;  Location: [facility];  Service: Endoscopy;  Laterality: N/A;  9:30  . CORONARY ANGIOPLASTY     1989  . TONSILLECTOMY     Current Outpatient Medications on File Prior to Visit  Medication Sig Dispense Refill  . allopurinol (ZYLOPRIM) 300 MG tablet TAKE 1 TABLET EVERY DAY 90 tablet 3  . aspirin EC 81 MG tablet Take 81 mg by  mouth daily.    . Blood Glucose Monitoring Suppl (TRUE METRIX METER) w/Device KIT Use as Directed 1 kit 1  . diltiazem (CARDIZEM CD) 240 MG 24 hr capsule TAKE 1 CAPSULE EVERY DAY 90 capsule 1  . doxazosin (CARDURA) 4 MG tablet TAKE 1 TABLET EVERY DAY 90 tablet 2  . furosemide (LASIX) 40 MG tablet Take 1 tablet (40 mg total) by mouth daily. 30 tablet 3  . glipiZIDE (GLUCOTROL) 5 MG tablet TAKE 1 TABLET TWO TIMES DAILY BEFORE  MEALS 180 tablet 2  . glucose blood (TRUE METRIX BLOOD GLUCOSE TEST) test strip Check BS QD - dx: e11.9 100 each 12  . ketorolac (ACULAR) 0.5 % ophthalmic solution Place 1 drop into the left eye 2 (two) times daily. 5 mL 4  . Lancet Devices (PRODIGY LANCING DEVICE) MISC Use bid DX e11.9 1 each 2  . lisinopril (ZESTRIL) 40 MG tablet TAKE 1 TABLET EVERY DAY 90 tablet 3  . metoprolol succinate (TOPROL-XL) 25 MG 24 hr tablet Take 1 tablet (25 mg total) by mouth daily. 90 tablet 3  . Multiple Vitamin (MULTIVITAMIN WITH MINERALS) TABS Take 1 tablet by mouth daily.    . Omega-3 Fatty Acids (FISH OIL PO) Take by mouth.    . pantoprazole (PROTONIX) 20 MG tablet Take 1 tablet (20 mg total) by mouth daily. 30 tablet 5  . potassium chloride SA (KLOR-CON) 20 MEQ tablet Take 1 tablet (20 mEq total) by mouth daily. 30 tablet 3  . rosuvastatin (CRESTOR) 20 MG tablet TAKE 1 TABLET EVERY DAY 90 tablet 1  . sitaGLIPtin (JANUVIA) 100 MG tablet Take 1 tablet (100 mg total) by mouth daily. 90 tablet 1  . TRUEPLUS LANCETS 33G MISC Check BS QD - dx: e11.9 100 each 3   No current facility-administered medications on file prior to visit.   Allergies  Allergen Reactions  . Jardiance [Empagliflozin]     Severe nausea-    Social History   Socioeconomic History  . Marital status: Married    Spouse name: Not on file  . Number of children: Not on file  . Years of education: Not on file  . Highest education level: Not on file  Occupational History  . Occupation: retired    Fish farm manager: RETIRED      Comment: [employer]   Tobacco Use  . Smoking status: Former Smoker    Years: 35.00    Types: Cigarettes    Quit date: 01/02/1988    Years since quitting: 31.8  . Smokeless tobacco: Never Used  Vaping Use  . Vaping Use: Never used  Substance and Sexual Activity  . Alcohol use: Not Currently    Comment: rare beer, no history of ETOH abuse  . Drug use: No  . Sexual activity: Not on file  Other Topics Concern  . Not on file  Social History Narrative  . Not on file   Social Determinants of Health   Financial Resource Strain:   . Difficulty of Paying Living Expenses: Not on file  Food Insecurity:   . Worried About Charity fundraiser in the Last Year: Not on file  . Ran Out of Food in the Last Year: Not on file  Transportation Needs:   . Lack of Transportation (Medical): Not on file  . Lack of Transportation (Non-Medical): Not on file  Physical Activity:   . Days of Exercise per Week: Not on file  . Minutes of Exercise per Session: Not on file  Stress:   . Feeling of Stress : Not on file  Social Connections:   . Frequency of Communication with Friends and Family: Not on file  . Frequency of Social Gatherings with Friends and Family: Not on file  . Attends Religious Services: Not on file  . Active Member of Clubs or Organizations: Not on file  . Attends Archivist Meetings: Not on file  . Marital Status: Not on file  Intimate Partner Violence:   . Fear of Current or Ex-Partner: Not on file  . Emotionally Abused: Not on file  . Physically Abused: Not on file  . Sexually Abused: Not on file   Family History  Problem Relation Age of Onset  . Diabetes Mother   . Heart disease Mother        After age 28  . Heart attack Mother   . Hypertension Mother   . Diabetes Father   . Heart disease Father        After age 30  . Hypertension Father   . Heart attack Father   . Brain cancer Sister   . Cancer Sister        Brain  . Diabetes Sister   . Hypertension  Sister   . Cancer Brother        Chest Tumor  . Diabetes Brother   . Hypertension Brother   . Deep vein thrombosis Sister   . Diabetes Sister   . Diabetes Brother        Bilateral leg  . Colon cancer Neg Hx       Review of Systems  All other systems reviewed and are negative.      Objective:   Physical Exam Vitals reviewed.  Constitutional:      General: He is not in acute distress.    Appearance: He is well-developed. He is not diaphoretic.  HENT:     Head: Normocephalic and atraumatic.     Right Ear: External ear normal.     Left Ear: External ear normal.     Nose: Nose normal.     Mouth/Throat:     Pharynx: No oropharyngeal exudate.  Eyes:     General: No scleral icterus.       Right eye: No discharge.        Left eye: No discharge.     Conjunctiva/sclera: Conjunctivae normal.     Pupils: Pupils are equal, round, and reactive to light.  Neck:     Thyroid: No thyromegaly.     Vascular: No JVD.     Trachea: No tracheal deviation.  Cardiovascular:     Rate and Rhythm: Normal rate and regular rhythm.     Heart sounds: Murmur heard.  No friction rub. No gallop.   Pulmonary:     Effort: Pulmonary effort  is normal. No respiratory distress.     Breath sounds: Normal breath sounds. No stridor. No wheezing or rales.  Chest:     Chest wall: No tenderness.  Abdominal:     General: Bowel sounds are normal. There is no distension.     Palpations: Abdomen is soft. There is no mass.     Tenderness: There is no abdominal tenderness. There is no guarding or rebound.  Musculoskeletal:        General: No tenderness. Normal range of motion.     Cervical back: Normal range of motion and neck supple.     Right lower leg: Edema present.     Left lower leg: Edema present.  Lymphadenopathy:     Cervical: No cervical adenopathy.  Skin:    General: Skin is warm.     Coloration: Skin is not pale.     Findings: No erythema or rash.  Neurological:     Mental Status: He is alert  and oriented to person, place, and time.     Cranial Nerves: No cranial nerve deficit.     Motor: No abnormal muscle tone.     Coordination: Coordination normal.     Deep Tendon Reflexes: Reflexes are normal and symmetric.  Psychiatric:        Behavior: Behavior normal.        Thought Content: Thought content normal.        Judgment: Judgment normal.           Assessment & Plan:   Shortness of breath - Plan: DG Chest 2 View, BASIC METABOLIC PANEL WITH GFR  Iron deficiency anemia, unspecified iron deficiency anemia type  Leg swelling  DOE (dyspnea on exertion)  Thankfully there is no evidence of systolic congestive heart failure.  His echocardiogram was normal.  I do believe the patient likely has diastolic dysfunction exacerbated by his obstructive sleep apnea causing his leg swelling.  The leg swelling has improved with diuresis however given the significant weight loss, I am concerned the patient may have prerenal azotemia.  Furthermore he has not seen significant improvement in his dyspnea despite the diuresis.  Therefore I do not believe it is related.  I will begin by obtaining a chest x-ray.  If normal I would recommend pulmonary consultation for pulmonary function test.  Recheck a CBC in 1 month to see if the patient's anemia is improving on iron replacement.  His stool test was negative for blood.  Likely will need to discontinue Lasix if the patient shows prerenal azotemia

## 2019-11-05 LAB — BASIC METABOLIC PANEL WITH GFR
BUN/Creatinine Ratio: 11 (calc) (ref 6–22)
BUN: 26 mg/dL — ABNORMAL HIGH (ref 7–25)
CO2: 25 mmol/L (ref 20–32)
Calcium: 9.2 mg/dL (ref 8.6–10.3)
Chloride: 100 mmol/L (ref 98–110)
Creat: 2.27 mg/dL — ABNORMAL HIGH (ref 0.70–1.11)
GFR, Est African American: 30 mL/min/{1.73_m2} — ABNORMAL LOW (ref >=60)
GFR, Est Non African American: 26 mL/min/{1.73_m2} — ABNORMAL LOW (ref >=60)
Glucose, Bld: 337 mg/dL — ABNORMAL HIGH (ref 65–99)
Potassium: 4.5 mmol/L (ref 3.5–5.3)
Sodium: 138 mmol/L (ref 135–146)

## 2019-11-06 ENCOUNTER — Ambulatory Visit (HOSPITAL_COMMUNITY)
Admission: RE | Admit: 2019-11-06 | Discharge: 2019-11-06 | Disposition: A | Discharge status: Home / Self Care | Payer: Medicare HMO | Source: Ambulatory Visit | Attending: Vascular Surgery | Admitting: Vascular Surgery

## 2019-11-06 ENCOUNTER — Other Ambulatory Visit: Payer: Self-pay

## 2019-11-06 ENCOUNTER — Other Ambulatory Visit: Payer: Self-pay | Admitting: Family Medicine

## 2019-11-06 ENCOUNTER — Encounter: Payer: Self-pay | Admitting: Physician Assistant

## 2019-11-06 ENCOUNTER — Ambulatory Visit: Payer: Medicare HMO | Admitting: Physician Assistant

## 2019-11-06 VITALS — BP 142/75 | HR 67 | Temp 97.7°F | Resp 20 | Ht 70.0 in | Wt 248.3 lb

## 2019-11-06 DIAGNOSIS — I714 Abdominal aortic aneurysm, without rupture, unspecified: Secondary | ICD-10-CM

## 2019-11-06 MED ORDER — AZITHROMYCIN 250 MG PO TABS: ORAL_TABLET | ORAL | 0 refills | Status: DC

## 2019-11-06 NOTE — Progress Notes (Signed)
Established Abdominal Aortic Aneurysm   History of Present Illness   Juan [name] is a 79 y.o. (1940-05-10) male who presents with chief complaint: follow up for AAA.  Previous studies demonstrate an AAA, measuring 4.57 cm.  The patient does not have back or abdominal pain.  He also denies claudication, rest pain, or non healing wounds of BLE.  The patient is a former smoker.  He is taking an aspirin and statin daily.  PMH also significant for CKD with most recent Cr of 2.2.  This is currently managed by his PCP.  The patient's PMH, PSH, SH, and FamHx were reviewed and are unchanged from prior visit.  Current Outpatient Medications  Medication Sig Dispense Refill  . allopurinol (ZYLOPRIM) 300 MG tablet TAKE 1 TABLET EVERY DAY 90 tablet 3  . aspirin EC 81 MG tablet Take 81 mg by mouth daily.    Marland Kitchen azithromycin (ZITHROMAX) 250 MG tablet 2 tabs poqday1, 1 tab poqday 2-5 6 tablet 0  . Blood Glucose Monitoring Suppl (TRUE METRIX METER) w/Device KIT Use as Directed 1 kit 1  . diltiazem (CARDIZEM CD) 240 MG 24 hr capsule TAKE 1 CAPSULE EVERY DAY 90 capsule 1  . doxazosin (CARDURA) 4 MG tablet TAKE 1 TABLET EVERY DAY 90 tablet 2  . furosemide (LASIX) 40 MG tablet Take 1 tablet (40 mg total) by mouth daily. 30 tablet 3  . glipiZIDE (GLUCOTROL) 5 MG tablet TAKE 1 TABLET TWO TIMES DAILY BEFORE  MEALS 180 tablet 2  . glucose blood (TRUE METRIX BLOOD GLUCOSE TEST) test strip Check BS QD - dx: e11.9 100 each 12  . ketorolac (ACULAR) 0.5 % ophthalmic solution Place 1 drop into the left eye 2 (two) times daily. 5 mL 4  . Lancet Devices (PRODIGY LANCING DEVICE) MISC Use bid DX e11.9 1 each 2  . lisinopril (ZESTRIL) 40 MG tablet TAKE 1 TABLET EVERY DAY 90 tablet 3  . metoprolol succinate (TOPROL-XL) 25 MG 24 hr tablet Take 1 tablet (25 mg total) by mouth daily. 90 tablet 3  . Multiple Vitamin (MULTIVITAMIN WITH MINERALS) TABS Take 1 tablet by mouth daily.    . Omega-3 Fatty Acids (FISH OIL PO) Take by  mouth.    . pantoprazole (PROTONIX) 20 MG tablet Take 1 tablet (20 mg total) by mouth daily. 30 tablet 5  . potassium chloride SA (KLOR-CON) 20 MEQ tablet Take 1 tablet (20 mEq total) by mouth daily. 30 tablet 3  . rosuvastatin (CRESTOR) 20 MG tablet TAKE 1 TABLET EVERY DAY 90 tablet 1  . sitaGLIPtin (JANUVIA) 100 MG tablet Take 1 tablet (100 mg total) by mouth daily. 90 tablet 1  . TRUEPLUS LANCETS 33G MISC Check BS QD - dx: e11.9 100 each 3   No current facility-administered medications for this visit.    REVIEW OF SYSTEMS (negative unless checked):   Cardiac:  '[]'  Chest pain or chest pressure? '[]'  Shortness of breath upon activity? '[]'  Shortness of breath when lying flat? '[]'  Irregular heart rhythm?  Vascular:  '[]'  Pain in calf, thigh, or hip brought on by walking? '[]'  Pain in feet at night that wakes you up from your sleep? '[]'  Blood clot in your veins? '[]'  Leg swelling?  Pulmonary:  '[]'  Oxygen at home? '[]'  Productive cough? '[]'  Wheezing?  Neurologic:  '[]'  Sudden weakness in arms or legs? '[]'  Sudden numbness in arms or legs? '[]'  Sudden onset of difficult speaking or slurred speech? '[]'  Temporary loss of vision in one eye? '[]'   Problems with dizziness?  Gastrointestinal:  '[]'  Blood in stool? '[]'  Vomited blood?  Genitourinary:  '[]'  Burning when urinating? '[]'  Blood in urine?  Psychiatric:  '[]'  Major depression  Hematologic:  '[]'  Bleeding problems? '[]'  Problems with blood clotting?  Dermatologic:  '[]'  Rashes or ulcers?  Constitutional:  '[]'  Fever or chills?  Ear/Nose/Throat:  '[]'  Change in hearing? '[]'  Nose bleeds? '[]'  Sore throat?  Musculoskeletal:  '[]'  Back pain? '[]'  Joint pain? '[]'  Muscle pain?   Physical Examination   Vitals:   11/06/19 0911  BP: (!) 142/75  Pulse: 67  Resp: 20  Temp: 97.7 F (36.5 C)  TempSrc: Temporal  SpO2: 98%  Weight: 248 lb 4.8 oz (112.6 kg)  Height: '5\' 10"'  (1.778 m)   Body mass index is 35.63 kg/m.  General:  WDWN in NAD; vital signs  documented above Gait: Not observed HENT: WNL, normocephalic Pulmonary: normal non-labored breathing , without Rales, rhonchi,  wheezing Cardiac: regular HR Abdomen: soft, NT, no masses Skin: without rashes Vascular Exam/Pulses:  Right Left  Radial 2+ (normal) 2+ (normal)  Ulnar absent absent  Femoral 2+ (normal) 2+ (normal)  Popliteal absent absent  DP 1+ (weak) absent  PT 1+ (weak) 1+ (weak)   Extremities: without ischemic changes, without Gangrene , without cellulitis; without open wounds;  Musculoskeletal: no muscle wasting or atrophy  Neurologic: A&O X 3;  No focal weakness or paresthesias are detected Psychiatric:  The pt has Normal affect.   Non-Invasive Vascular Imaging   AAA Duplex   Current size: 4.64 cm  Previous size: 4.58 cm   R CIA: 2 cm  L CIA: not vizualized   Medical Decision Making   Juan [name] is a 79 y.o. (1941/02/01) male who presents with: asymptomatic AAA surveillance   AAA size essentially the same with maximal diameter measurement of 4.64cm  No indication for repair of AAA at this time  Follow regularly with PCP for management of chronic medical conditions including HTN, CKD, and DM  Recheck AAA duplex in 6 months per protocol   Dagoberto Ligas PA-C [facility] Office: [phone]  Clinic MD: Oneida Alar

## 2019-11-07 ENCOUNTER — Other Ambulatory Visit: Payer: Self-pay | Admitting: *Deleted

## 2019-11-07 ENCOUNTER — Other Ambulatory Visit (HOSPITAL_COMMUNITY): Payer: Medicare HMO

## 2019-11-07 DIAGNOSIS — I714 Abdominal aortic aneurysm, without rupture, unspecified: Secondary | ICD-10-CM

## 2019-11-10 ENCOUNTER — Other Ambulatory Visit: Payer: Self-pay

## 2019-11-10 DIAGNOSIS — J690 Pneumonitis due to inhalation of food and vomit: Secondary | ICD-10-CM

## 2019-11-10 DIAGNOSIS — R0602 Shortness of breath: Secondary | ICD-10-CM

## 2019-11-18 NOTE — Telephone Encounter (Addendum)
Received a notification that the patient returned machine. It appeared he was set up 10/14/2019 and the email was forwarded to me on 10/25/2019  "Please let Myriam Jacobson / Dr. Brett Fairy know that this patient has returned his machine. He walked in and stated that he just doesn't use it and didn't want to continue."

## 2019-11-22 ENCOUNTER — Other Ambulatory Visit: Payer: Self-pay | Admitting: Family Medicine

## 2019-11-24 ENCOUNTER — Other Ambulatory Visit: Payer: Self-pay

## 2019-11-24 ENCOUNTER — Ambulatory Visit (HOSPITAL_COMMUNITY)
Admission: RE | Admit: 2019-11-24 | Discharge: 2019-11-24 | Disposition: A | Discharge status: Home / Self Care | Payer: Medicare HMO | Source: Ambulatory Visit | Attending: Family Medicine | Admitting: Family Medicine

## 2019-11-24 DIAGNOSIS — J690 Pneumonitis due to inhalation of food and vomit: Secondary | ICD-10-CM | POA: Insufficient documentation

## 2019-11-24 DIAGNOSIS — R0602 Shortness of breath: Secondary | ICD-10-CM | POA: Diagnosis not present

## 2019-11-24 DIAGNOSIS — J189 Pneumonia, unspecified organism: Secondary | ICD-10-CM | POA: Diagnosis not present

## 2019-11-26 ENCOUNTER — Ambulatory Visit: Payer: Self-pay | Admitting: Neurology

## 2019-12-01 ENCOUNTER — Other Ambulatory Visit: Payer: Self-pay

## 2019-12-01 ENCOUNTER — Encounter (INDEPENDENT_AMBULATORY_CARE_PROVIDER_SITE_OTHER): Payer: Self-pay | Admitting: Ophthalmology

## 2019-12-01 ENCOUNTER — Ambulatory Visit (INDEPENDENT_AMBULATORY_CARE_PROVIDER_SITE_OTHER): Payer: Medicare HMO | Admitting: Ophthalmology

## 2019-12-01 ENCOUNTER — Other Ambulatory Visit: Payer: Medicare HMO

## 2019-12-01 DIAGNOSIS — D509 Iron deficiency anemia, unspecified: Secondary | ICD-10-CM

## 2019-12-01 DIAGNOSIS — R7309 Other abnormal glucose: Secondary | ICD-10-CM | POA: Diagnosis not present

## 2019-12-01 DIAGNOSIS — E7800 Pure hypercholesterolemia, unspecified: Secondary | ICD-10-CM

## 2019-12-01 DIAGNOSIS — Z0000 Encounter for general adult medical examination without abnormal findings: Secondary | ICD-10-CM

## 2019-12-01 DIAGNOSIS — N183 Chronic kidney disease, stage 3 unspecified: Secondary | ICD-10-CM

## 2019-12-01 DIAGNOSIS — E119 Type 2 diabetes mellitus without complications: Secondary | ICD-10-CM | POA: Diagnosis not present

## 2019-12-01 DIAGNOSIS — H35352 Cystoid macular degeneration, left eye: Secondary | ICD-10-CM

## 2019-12-01 DIAGNOSIS — Z125 Encounter for screening for malignant neoplasm of prostate: Secondary | ICD-10-CM

## 2019-12-01 DIAGNOSIS — H35072 Retinal telangiectasis, left eye: Secondary | ICD-10-CM | POA: Diagnosis not present

## 2019-12-01 NOTE — Assessment & Plan Note (Signed)
Patient gave up on his attempt to use the sleep apnea equipment and no longer uses it.

## 2019-12-01 NOTE — Assessment & Plan Note (Signed)
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  Using topical NSAID twice daily left eye.

## 2019-12-01 NOTE — Progress Notes (Signed)
12/01/2019     CHIEF COMPLAINT Patient presents for Retina Follow Up   HISTORY OF PRESENT ILLNESS: Juan [name] is a 79 y.o. male who presents to the clinic today for:   HPI    Retina Follow Up    Diagnosis: CME.  In left eye.  Severity is moderate.  Duration of 8 weeks.  Since onset it is stable.  I, the attending physician,  performed the HPI with the patient and updated documentation appropriately.          Comments    8 Week CME f\u OS. Dil OU. OCT  Pt states vision is doing well. Denies any complaints. Pt using gtts OS as directed. Pt states he has not been using CPAP       Last edited by Tilda Franco on 12/01/2019  2:44 PM. (History)      Referring physician: Susy Frizzle, MD [address] [place],  [place] [address]  HISTORICAL INFORMATION:   Selected notes from the MEDICAL RECORD NUMBER    Lab Results  Component Value Date   HGBA1C 7.2 (H) 05/12/2019     CURRENT MEDICATIONS: Current Outpatient Medications (Ophthalmic Drugs)  Medication Sig  . ketorolac (ACULAR) 0.5 % ophthalmic solution Place 1 drop into the left eye 2 (two) times daily.   No current facility-administered medications for this visit. (Ophthalmic Drugs)   Current Outpatient Medications (Other)  Medication Sig  . allopurinol (ZYLOPRIM) 300 MG tablet TAKE 1 TABLET EVERY DAY  . aspirin EC 81 MG tablet Take 81 mg by mouth daily.  Marland Kitchen azithromycin (ZITHROMAX) 250 MG tablet 2 tabs poqday1, 1 tab poqday 2-5  . Blood Glucose Monitoring Suppl (TRUE METRIX METER) w/Device KIT Use as Directed  . diltiazem (CARDIZEM CD) 240 MG 24 hr capsule TAKE 1 CAPSULE EVERY DAY  . doxazosin (CARDURA) 4 MG tablet TAKE 1 TABLET EVERY DAY  . furosemide (LASIX) 40 MG tablet Take 1 tablet (40 mg total) by mouth daily.  Marland Kitchen glipiZIDE (GLUCOTROL) 5 MG tablet TAKE 1 TABLET TWO TIMES DAILY BEFORE  MEALS  . glucose blood (TRUE METRIX BLOOD GLUCOSE TEST) test strip Check BS QD - dx: e11.9  . Lancet Devices  (PRODIGY LANCING DEVICE) MISC Use bid DX e11.9  . lisinopril (ZESTRIL) 40 MG tablet TAKE 1 TABLET EVERY DAY  . metoprolol succinate (TOPROL-XL) 25 MG 24 hr tablet Take 1 tablet (25 mg total) by mouth daily.  . Multiple Vitamin (MULTIVITAMIN WITH MINERALS) TABS Take 1 tablet by mouth daily.  . Omega-3 Fatty Acids (FISH OIL PO) Take by mouth.  . pantoprazole (PROTONIX) 20 MG tablet Take 1 tablet (20 mg total) by mouth daily.  . potassium chloride SA (KLOR-CON) 20 MEQ tablet Take 1 tablet (20 mEq total) by mouth daily.  . rosuvastatin (CRESTOR) 20 MG tablet TAKE 1 TABLET EVERY DAY  . sitaGLIPtin (JANUVIA) 100 MG tablet Take 1 tablet (100 mg total) by mouth daily.  . TRUEPLUS LANCETS 33G MISC Check BS QD - dx: e11.9   No current facility-administered medications for this visit. (Other)      REVIEW OF SYSTEMS:    ALLERGIES Allergies  Allergen Reactions  . Jardiance [Empagliflozin]     Severe nausea-     PAST MEDICAL HISTORY Past Medical History:  Diagnosis Date  . AAA (abdominal aortic aneurysm) ([place]) 8/12   3.5cm  . CAD (coronary artery disease)   . CKD (chronic kidney disease) stage 3, GFR 30-59 ml/min   .  Colon polyps   . Diabetes mellitus without complication ([place])   . 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 Hyperlipidemia   . Hypertension   . 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 Neuromuscular disorder ([place])    L2-3,L5-S1 bulging disc /nerve impingement  . PAD (peripheral artery disease) ([place])    Past Surgical History:  Procedure Laterality Date  . COLONOSCOPY N/A 09/27/2012   Procedure: COLONOSCOPY;  Surgeon: Daneil Dolin, MD;  Location: [facility];  Service: Endoscopy;  Laterality: N/A;  9:30  . CORONARY ANGIOPLASTY     1989  . TONSILLECTOMY      FAMILY HISTORY Family History  Problem Relation Age of Onset  . Diabetes Mother   . Heart disease Mother        After age 66  . Heart attack Mother    . Hypertension Mother   . Diabetes Father   . Heart disease Father        After age 30  . Hypertension Father   . Heart attack Father   . Brain cancer Sister   . Cancer Sister        Brain  . Diabetes Sister   . Hypertension Sister   . Cancer Brother        Chest Tumor  . Diabetes Brother   . Hypertension Brother   . Deep vein thrombosis Sister   . Diabetes Sister   . Diabetes Brother        Bilateral leg  . Colon cancer Neg Hx     SOCIAL HISTORY Social History   Tobacco Use  . Smoking status: Former Smoker    Years: 35.00    Types: Cigarettes    Quit date: 01/02/1988    Years since quitting: 31.9  . Smokeless tobacco: Never Used  Vaping Use  . Vaping Use: Never used  Substance Use Topics  . Alcohol use: Not Currently    Comment: rare beer, no history of ETOH abuse  . Drug use: No         OPHTHALMIC EXAM: Base Eye Exam    Visual Acuity (Snellen - Linear)      Right Left   Dist [place] 20/25 -1 20/100 +   Dist ph [place]  NI       Tonometry (Tonopen, 2:47 PM)      Right Left   Pressure 6 8       Pupils      Pupils Dark Light Shape React APD   Right PERRL 2 2 Round Sluggish None   Left PERRL 2 2 Round Sluggish None       Visual Fields (Counting fingers)      Left Right    Full Full       Neuro/Psych    Oriented x3: Yes   Mood/Affect: Normal       Dilation    Both eyes: 1.0% Mydriacyl, 2.5% Phenylephrine @ 2:47 PM        Slit Lamp and Fundus Exam    External Exam      Right Left   External Normal Normal       Slit Lamp Exam      Right Left   Lids/Lashes Normal Normal   Conjunctiva/Sclera White and quiet White and quiet   Cornea Clear Clear   Anterior Chamber Deep and quiet Deep and quiet   Iris Round and reactive Round and reactive  Lens Posterior chamber intraocular lens Posterior chamber intraocular lens   Anterior Vitreous Normal Normal       Fundus Exam      Right Left   Posterior Vitreous Normal Normal   Disc Normal Normal    C/D Ratio 0.15 0.2   Macula Epiretinal membrane, no topo distortion ,, no clinical CME, 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 Scan locations included subfoveal. Central Foveal Thickness: 297.   Left Eye Quality was good. Scan locations included subfoveal. Central Foveal Thickness: 314. Progression has improved. Findings include vitreomacular adhesion .   Notes CME OS has improved on topical NSAID.                ASSESSMENT/PLAN:  Cystoid macular edema of left eye CME from the left eye as compared to July 2021 has improved and in fact resolved in the left eye even in the midst of vitreal macular adhesion.  Using topical NSAID twice daily left eye.  Retinal telangiectasis, left eye Patient gave up on his attempt to use the sleep apnea equipment and no longer uses it.      ICD-10-CM   1. Cystoid macular edema of left eye  H35.352 OCT, Retina - OU - Both Eyes  2. Retinal telangiectasis, left eye  H35.072     1.  CME OS has improved yet patient is off of CPAP and is only using topical NSAID, ketorolac twice daily OS  2.  Continue use of topical NSAID twice daily left eye until spring 2021 upon follow-up here.  If CME recurs, that could be because some effect of untreated sleep apnea  3.  Ophthalmic Meds Ordered this visit:  No orders of the defined types were placed in this encounter.      Return in about 6 months (around 05/30/2020) for dilate, OCT, OS.  There are no Patient Instructions on file for this visit.   Explained the diagnoses, plan, and follow up with the patient and they expressed understanding.  Patient expressed understanding of the importance of proper follow up care.   Clent Demark Terran Klinke M.D. Diseases & Surgery of the Retina and Vitreous [facility] [place] 12/01/19     Abbreviations: M myopia (nearsighted); A astigmatism; H hyperopia (farsighted); P presbyopia; Mrx spectacle prescription;  CTL contact lenses; OD right eye; OS left eye; OU both eyes  XT exotropia; ET esotropia; PEK punctate epithelial keratitis; PEE punctate epithelial erosions; DES dry eye syndrome; MGD meibomian gland dysfunction; ATs artificial tears; PFAT's preservative free artificial tears; [place] nuclear sclerotic cataract; PSC posterior subcapsular cataract; ERM epi-retinal membrane; PVD posterior vitreous detachment; RD retinal detachment; DM diabetes mellitus; DR diabetic retinopathy; NPDR non-proliferative diabetic retinopathy; PDR proliferative diabetic retinopathy; CSME clinically significant macular edema; DME diabetic macular edema; dbh dot blot hemorrhages; CWS cotton wool spot; POAG primary open angle glaucoma; C/D cup-to-disc ratio; HVF humphrey visual field; GVF goldmann visual field; OCT optical coherence tomography; IOP intraocular pressure; BRVO Branch retinal vein occlusion; CRVO central retinal vein occlusion; CRAO central retinal artery occlusion; BRAO branch retinal artery occlusion; RT retinal tear; SB scleral buckle; PPV pars plana vitrectomy; VH Vitreous hemorrhage; PRP panretinal laser photocoagulation; IVK intravitreal kenalog; VMT vitreomacular traction; MH Macular hole;  NVD  neovascularization of the disc; NVE neovascularization elsewhere; AREDS age related eye disease study; ARMD age related macular degeneration; POAG primary open angle glaucoma; EBMD epithelial/anterior basement membrane dystrophy; ACIOL anterior chamber intraocular lens; IOL intraocular lens; PCIOL posterior chamber intraocular lens; Phaco/IOL phacoemulsification with intraocular lens placement; [place] photorefractive keratectomy; LASIK laser assisted in situ keratomileusis; HTN hypertension; DM diabetes mellitus; COPD chronic obstructive pulmonary disease

## 2019-12-03 ENCOUNTER — Other Ambulatory Visit: Payer: Self-pay | Admitting: *Deleted

## 2019-12-04 ENCOUNTER — Ambulatory Visit (INDEPENDENT_AMBULATORY_CARE_PROVIDER_SITE_OTHER): Payer: Medicare HMO | Admitting: Family Medicine

## 2019-12-04 ENCOUNTER — Other Ambulatory Visit: Payer: Self-pay

## 2019-12-04 VITALS — BP 124/70 | HR 87 | Temp 97.4°F | Ht 70.0 in | Wt 254.0 lb

## 2019-12-04 DIAGNOSIS — E119 Type 2 diabetes mellitus without complications: Secondary | ICD-10-CM | POA: Diagnosis not present

## 2019-12-04 DIAGNOSIS — Z0001 Encounter for general adult medical examination with abnormal findings: Secondary | ICD-10-CM | POA: Diagnosis not present

## 2019-12-04 DIAGNOSIS — R29898 Other symptoms and signs involving the musculoskeletal system: Secondary | ICD-10-CM

## 2019-12-04 DIAGNOSIS — I10 Essential (primary) hypertension: Secondary | ICD-10-CM

## 2019-12-04 DIAGNOSIS — I714 Abdominal aortic aneurysm, without rupture, unspecified: Secondary | ICD-10-CM

## 2019-12-04 DIAGNOSIS — R0600 Dyspnea, unspecified: Secondary | ICD-10-CM | POA: Diagnosis not present

## 2019-12-04 DIAGNOSIS — M7989 Other specified soft tissue disorders: Secondary | ICD-10-CM

## 2019-12-04 DIAGNOSIS — R0609 Other forms of dyspnea: Secondary | ICD-10-CM

## 2019-12-04 DIAGNOSIS — D509 Iron deficiency anemia, unspecified: Secondary | ICD-10-CM | POA: Diagnosis not present

## 2019-12-04 DIAGNOSIS — I2510 Atherosclerotic heart disease of native coronary artery without angina pectoris: Secondary | ICD-10-CM | POA: Diagnosis not present

## 2019-12-04 MED ORDER — PANTOPRAZOLE SODIUM 20 MG PO TBEC
20.0000 mg | DELAYED_RELEASE_TABLET | Freq: Every day | ORAL | 3 refills | Status: DC
Start: 2019-12-04 — End: 2020-11-01

## 2019-12-04 NOTE — Progress Notes (Signed)
Subjective:    Patient ID: Juan [name], male    DOB: [birth year], 79 y.o.   MRN: [id number]  Patient is here today for complete physical exam.  Recently I been seeing the patient for swelling in his legs and dyspnea on exertion.  Echocardiogram and cardiac work-up were unremarkable.  Chest x-ray does show increased and progressive interstitial markings concerning for possible interstitial lung disease.  I have recommended a pulmonology consultation for his shortness of breath but he would like to defer that at the present time.  Instead he is more concerned about his lower back.  He reports pain in his lower back with ambulation.  He states that if he walks to the mailbox he has to stop halfway back #1 due to shortness of breath which could be deconditioning.  However, #2, the patient states that his legs feel weak like they may give out on him.  He reports burning and stinging and numbness and tingling going down his legs.  He reports weakness in the muscles of his legs.  He denies any muscle weakness in his arms or in his shoulders.  He denies any pain in his muscles that would suggest PMR.  However I am concerned about possible neurogenic claudication.  He denies any falls.  He denies any depression.  He denies any memory loss.  Due to his age she is not due for any cancer screening.  His immunizations are up-to-date except for a flu shot which he defers today because he is scheduled to get a booster on his Covid vaccination very soon.  Lab on 12/01/2019  Component Date Value Ref Range Status  . PSA 12/01/2019 0.80  < OR = 4.0 ng/mL Final   Comment: The total PSA value from this assay system is  standardized against the WHO standard. The test  result will be approximately 20% lower when compared  to the equimolar-standardized total PSA (Beckman  Coulter). Comparison of serial PSA results should be  interpreted with this fact in mind. . This test was performed using the Siemens   chemiluminescent method. Values obtained from  different assay methods cannot be used interchangeably. PSA levels, regardless of value, should not be interpreted as absolute evidence of the presence or absence of disease.   . Glucose, Bld 12/01/2019 215* 65 - 99 mg/dL Final   Comment: .            Fasting reference interval . For someone without known diabetes, a glucose value >125 mg/dL indicates that they may have diabetes and this should be confirmed with a follow-up test. .   . BUN 12/01/2019 21  7 - 25 mg/dL Final  . Creat 12/01/2019 1.63* 0.70 - 1.11 mg/dL Final   Comment: For patients >6 years of age, the reference limit for Creatinine is approximately 13% higher for people identified as African-American. .   . GFR, Est Non African American 12/01/2019 39* > OR = 60 mL/min/1.56m Final  . GFR, Est African American 12/01/2019 45* > OR = 60 mL/min/1.749mFinal  . BUN/Creatinine Ratio 12/01/2019 13  6 - 22 (calc) Final  . Sodium 12/01/2019 139  135 - 146 mmol/L Final  . Potassium 12/01/2019 4.6  3.5 - 5.3 mmol/L Final  . Chloride 12/01/2019 105  98 - 110 mmol/L Final  . CO2 12/01/2019 23  20 - 32 mmol/L Final  . Calcium 12/01/2019 9.4  8.6 - 10.3 mg/dL Final  . Total Protein 12/01/2019 6.7  6.1 - 8.1 g/dL Final  .  Albumin 12/01/2019 4.1  3.6 - 5.1 g/dL Final  . Globulin 12/01/2019 2.6  1.9 - 3.7 g/dL (calc) Final  . AG Ratio 12/01/2019 1.6  1.0 - 2.5 (calc) Final  . Total Bilirubin 12/01/2019 0.5  0.2 - 1.2 mg/dL Final  . Alkaline phosphatase (APISO) 12/01/2019 73  35 - 144 U/L Final  . AST 12/01/2019 14  10 - 35 U/L Final  . ALT 12/01/2019 15  9 - 46 U/L Final  . WBC 12/01/2019 7.3  3.8 - 10.8 Thousand/uL Final  . RBC 12/01/2019 3.63* 4.20 - 5.80 Million/uL Final  . Hemoglobin 12/01/2019 11.2* 13.2 - 17.1 g/dL Final  . HCT 12/01/2019 34.0* 38 - 50 % Final  . MCV 12/01/2019 93.7  80.0 - 100.0 fL Final  . MCH 12/01/2019 30.9  27.0 - 33.0 pg Final  . MCHC 12/01/2019  32.9  32.0 - 36.0 g/dL Final  . RDW 12/01/2019 13.2  11.0 - 15.0 % Final  . Platelets 12/01/2019 128* 140 - 400 Thousand/uL Final  . MPV 12/01/2019 10.5  7.5 - 12.5 fL Final  . Neutro Abs 12/01/2019 5,183  1,500 - 7,800 cells/uL Final  . Lymphs Abs 12/01/2019 1,321  850 - 3,900 cells/uL Final  . Absolute Monocytes 12/01/2019 628  200 - 950 cells/uL Final  . Eosinophils Absolute 12/01/2019 139  15 - 500 cells/uL Final  . Basophils Absolute 12/01/2019 29  0 - 200 cells/uL Final  . Neutrophils Relative % 12/01/2019 71  % Final  . Total Lymphocyte 12/01/2019 18.1  % Final  . Monocytes Relative 12/01/2019 8.6  % Final  . Eosinophils Relative 12/01/2019 1.9  % Final  . Basophils Relative 12/01/2019 0.4  % Final  . Cholesterol 12/01/2019 133  <200 mg/dL Final  . HDL 12/01/2019 36* > OR = 40 mg/dL Final  . Triglycerides 12/01/2019 217* <150 mg/dL Final   Comment: . If a non-fasting specimen was collected, consider repeat triglyceride testing on a fasting specimen if clinically indicated.  Yates Decamp et al. J. of Clin. Lipidol. 8469;6:295-284. .   . LDL Cholesterol (Calc) 12/01/2019 68  mg/dL (calc) Final   Comment: Reference range: <100 . Desirable range <100 mg/dL for primary prevention;   <70 mg/dL for patients with CHD or diabetic patients  with > or = 2 CHD risk factors. Marland Kitchen LDL-C is now calculated using the Martin-Hopkins  calculation, which is a validated novel method providing  better accuracy than the Friedewald equation in the  estimation of LDL-C.  Cresenciano Genre et al. Annamaria Helling. 1324;401(02): 2061-2068  (http://education.[facility].com/faq/FAQ164)   . Total CHOL/HDL Ratio 12/01/2019 3.7  <5.0 (calc) Final  . Non-HDL Cholesterol (Calc) 12/01/2019 97  <130 mg/dL (calc) Final   Comment: For patients with diabetes plus 1 major ASCVD risk  factor, treating to a non-HDL-C goal of <100 mg/dL  (LDL-C of <70 mg/dL) is considered a therapeutic  option.   Office Visit on 11/04/2019   Component Date Value Ref Range Status  . Glucose, Bld 11/04/2019 337* 65 - 99 mg/dL Final   Comment: .            Fasting reference interval . For someone without known diabetes, a glucose value >125 mg/dL indicates that they may have diabetes and this should be confirmed with a follow-up test. .   . BUN 11/04/2019 26* 7 - 25 mg/dL Final  . Creat 11/04/2019 2.27* 0.70 - 1.11 mg/dL Final   Comment: For patients >54 years of age, the reference limit for Creatinine  is approximately 13% higher for people identified as African-American. .   . GFR, Est Non African American 11/04/2019 26* > OR = 60 mL/min/1.16m Final  . GFR, Est African American 11/04/2019 30* > OR = 60 mL/min/1.759mFinal  . BUN/Creatinine Ratio 11/04/2019 11  6 - 22 (calc) Final  . Sodium 11/04/2019 138  135 - 146 mmol/L Final  . Potassium 11/04/2019 4.5  3.5 - 5.3 mmol/L Final  . Chloride 11/04/2019 100  98 - 110 mmol/L Final  . CO2 11/04/2019 25  20 - 32 mmol/L Final  . Calcium 11/04/2019 9.2  8.6 - 10.3 mg/dL Final    Past Medical History:  Diagnosis Date  . AAA (abdominal aortic aneurysm) (HCC[facility]man8/12   3.5cm  . CAD (coronary artery disease)   . CKD (chronic kidney disease) stage 3, GFR 30-59 ml/min   . Colon polyps   . Diabetes mellitus without complication ([facility]  . 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 Hyperlipidemia   . Hypertension   . 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 Neuromuscular disorder ([facility]   L2-3,L5-S1 bulging disc /nerve impingement  . PAD (peripheral artery disease) ([facility]   Past Surgical History:  Procedure Laterality Date  . COLONOSCOPY N/A 09/27/2012   Procedure: COLONOSCOPY;  Surgeon: RoDaneil DolinMD;  Location: [facility];  Service: Endoscopy;  Laterality: N/A;  9:30  . CORONARY ANGIOPLASTY     1989  . TONSILLECTOMY     Current Outpatient Medications on File Prior to Visit  Medication Sig Dispense Refill   . allopurinol (ZYLOPRIM) 300 MG tablet TAKE 1 TABLET EVERY DAY 90 tablet 3  . aspirin EC 81 MG tablet Take 81 mg by mouth daily.    . Marland Kitchenzithromycin (ZITHROMAX) 250 MG tablet 2 tabs poqday1, 1 tab poqday 2-5 6 tablet 0  . Blood Glucose Monitoring Suppl (TRUE METRIX METER) w/Device KIT Use as Directed 1 kit 1  . diltiazem (CARDIZEM CD) 240 MG 24 hr capsule TAKE 1 CAPSULE EVERY DAY 90 capsule 1  . doxazosin (CARDURA) 4 MG tablet TAKE 1 TABLET EVERY DAY 90 tablet 2  . furosemide (LASIX) 40 MG tablet Take 1 tablet (40 mg total) by mouth daily. 30 tablet 3  . glipiZIDE (GLUCOTROL) 5 MG tablet TAKE 1 TABLET TWO TIMES DAILY BEFORE  MEALS 180 tablet 2  . glucose blood (TRUE METRIX BLOOD GLUCOSE TEST) test strip Check BS QD - dx: e11.9 100 each 12  . ketorolac (ACULAR) 0.5 % ophthalmic solution Place 1 drop into the left eye 2 (two) times daily. 5 mL 4  . Lancet Devices (PRODIGY LANCING DEVICE) MISC Use bid DX e11.9 1 each 2  . lisinopril (ZESTRIL) 40 MG tablet TAKE 1 TABLET EVERY DAY 90 tablet 3  . metoprolol succinate (TOPROL-XL) 25 MG 24 hr tablet Take 1 tablet (25 mg total) by mouth daily. 90 tablet 3  . Multiple Vitamin (MULTIVITAMIN WITH MINERALS) TABS Take 1 tablet by mouth daily.    . Omega-3 Fatty Acids (FISH OIL PO) Take by mouth.    . potassium chloride SA (KLOR-CON) 20 MEQ tablet Take 1 tablet (20 mEq total) by mouth daily. 30 tablet 3  . rosuvastatin (CRESTOR) 20 MG tablet TAKE 1 TABLET EVERY DAY 90 tablet 1  . sitaGLIPtin (JANUVIA) 100 MG tablet Take 1 tablet (100 mg total) by mouth daily. 90 tablet 1  . TRUEPLUS LANCETS 33G MISC  Check BS QD - dx: e11.9 100 each 3   No current facility-administered medications on file prior to visit.   Allergies  Allergen Reactions  . Jardiance [Empagliflozin]     Severe nausea-    Social History   Socioeconomic History  . Marital status: Married    Spouse name: Not on file  . Number of children: Not on file  . Years of education: Not on file   . Highest education level: Not on file  Occupational History  . Occupation: retired    Fish farm manager: RETIRED    Comment: [employer]   Tobacco Use  . Smoking status: Former Smoker    Years: 35.00    Types: Cigarettes    Quit date: 01/02/1988    Years since quitting: 31.9  . Smokeless tobacco: Never Used  Vaping Use  . Vaping Use: Never used  Substance and Sexual Activity  . Alcohol use: Not Currently    Comment: rare beer, no history of ETOH abuse  . Drug use: No  . Sexual activity: Not on file  Other Topics Concern  . Not on file  Social History Narrative  . Not on file   Social Determinants of Health   Financial Resource Strain:   . Difficulty of Paying Living Expenses: Not on file  Food Insecurity:   . Worried About Charity fundraiser in the Last Year: Not on file  . Ran Out of Food in the Last Year: Not on file  Transportation Needs:   . Lack of Transportation (Medical): Not on file  . Lack of Transportation (Non-Medical): Not on file  Physical Activity:   . Days of Exercise per Week: Not on file  . Minutes of Exercise per Session: Not on file  Stress:   . Feeling of Stress : Not on file  Social Connections:   . Frequency of Communication with Friends and Family: Not on file  . Frequency of Social Gatherings with Friends and Family: Not on file  . Attends Religious Services: Not on file  . Active Member of Clubs or Organizations: Not on file  . Attends Archivist Meetings: Not on file  . Marital Status: Not on file  Intimate Partner Violence:   . Fear of Current or Ex-Partner: Not on file  . Emotionally Abused: Not on file  . Physically Abused: Not on file  . Sexually Abused: Not on file   Family History  Problem Relation Age of Onset  . Diabetes Mother   . Heart disease Mother        After age 68  . Heart attack Mother   . Hypertension Mother   . Diabetes Father   . Heart disease Father        After age 31  . Hypertension Father   . Heart  attack Father   . Brain cancer Sister   . Cancer Sister        Brain  . Diabetes Sister   . Hypertension Sister   . Cancer Brother        Chest Tumor  . Diabetes Brother   . Hypertension Brother   . Deep vein thrombosis Sister   . Diabetes Sister   . Diabetes Brother        Bilateral leg  . Colon cancer Neg Hx       Review of Systems  All other systems reviewed and are negative.      Objective:   Physical Exam Vitals reviewed.  Constitutional:  General: He is not in acute distress.    Appearance: He is well-developed. He is not diaphoretic.  HENT:     Head: Normocephalic and atraumatic.     Right Ear: External ear normal.     Left Ear: External ear normal.     Nose: Nose normal.     Mouth/Throat:     Pharynx: No oropharyngeal exudate.  Eyes:     General: No scleral icterus.       Right eye: No discharge.        Left eye: No discharge.     Conjunctiva/sclera: Conjunctivae normal.     Pupils: Pupils are equal, round, and reactive to light.  Neck:     Thyroid: No thyromegaly.     Vascular: No JVD.     Trachea: No tracheal deviation.  Cardiovascular:     Rate and Rhythm: Normal rate and regular rhythm.     Heart sounds: Murmur heard.  No friction rub. No gallop.   Pulmonary:     Effort: Pulmonary effort is normal. No respiratory distress.     Breath sounds: Normal breath sounds. No stridor. No wheezing or rales.  Chest:     Chest wall: No tenderness.  Abdominal:     General: Bowel sounds are normal. There is no distension.     Palpations: Abdomen is soft. There is no mass.     Tenderness: There is no abdominal tenderness. There is no guarding or rebound.  Musculoskeletal:        General: No tenderness. Normal range of motion.     Cervical back: Normal range of motion and neck supple.     Right lower leg: Edema present.     Left lower leg: Edema present.  Lymphadenopathy:     Cervical: No cervical adenopathy.  Skin:    General: Skin is warm.      Coloration: Skin is not pale.     Findings: No erythema or rash.  Neurological:     Mental Status: He is alert and oriented to person, place, and time.     Cranial Nerves: No cranial nerve deficit.     Motor: No abnormal muscle tone.     Coordination: Coordination normal.     Deep Tendon Reflexes: Reflexes are normal and symmetric.  Psychiatric:        Behavior: Behavior normal.        Thought Content: Thought content normal.        Judgment: Judgment normal.           Assessment & Plan:    Iron deficiency anemia, unspecified iron deficiency anemia type  Leg swelling  DOE (dyspnea on exertion)  Abdominal aortic aneurysm (AAA) without rupture (HCC)  Controlled type 2 diabetes mellitus without complication, without long-term current use of insulin (HCC)  Essential hypertension  Coronary artery disease involving native coronary artery of native heart without angina pectoris  Leg weakness, bilateral  Patient's chronic kidney disease has improved.  Blood pressure is excellent today.  Iron deficiency anemia is improving.  I recommended that he return in 1 month for repeat CBC.  Hemoglobin is up from 10.3-11.2.  Continue iron supplementation for now.  Recommended the patient receive his Covid booster as soon as possible.  He can return 2 weeks after that for his flu shot.  Also due for the Shingrix vaccine at a later date.  Recommended against any cancer screening.  Add a hemoglobin A1c to his lab work.  I am concerned  about possible neurogenic claudication versus peripheral neuropathy causing the weakness and numbness and pain in his legs with walking.  Arrange nerve conduction study/EMG of the lower extremities for this reason.  Patient defers a pulmonology consultation for interstitial lung disease at the present time.  AAA was last checked and found to be 4.6 cm in August.  He is due to recheck that in 6 months.

## 2019-12-05 LAB — TEST AUTHORIZATION

## 2019-12-05 LAB — COMPLETE METABOLIC PANEL WITH GFR
AG Ratio: 1.6 (calc) (ref 1.0–2.5)
ALT: 15 U/L (ref 9–46)
AST: 14 U/L (ref 10–35)
Albumin: 4.1 g/dL (ref 3.6–5.1)
Alkaline phosphatase (APISO): 73 U/L (ref 35–144)
BUN/Creatinine Ratio: 13 (calc) (ref 6–22)
BUN: 21 mg/dL (ref 7–25)
CO2: 23 mmol/L (ref 20–32)
Calcium: 9.4 mg/dL (ref 8.6–10.3)
Chloride: 105 mmol/L (ref 98–110)
Creat: 1.63 mg/dL — ABNORMAL HIGH (ref 0.70–1.11)
GFR, Est African American: 45 mL/min/{1.73_m2} — ABNORMAL LOW (ref >=60)
GFR, Est Non African American: 39 mL/min/{1.73_m2} — ABNORMAL LOW (ref >=60)
Globulin: 2.6 g/dL (calc) (ref 1.9–3.7)
Glucose, Bld: 215 mg/dL — ABNORMAL HIGH (ref 65–99)
Potassium: 4.6 mmol/L (ref 3.5–5.3)
Sodium: 139 mmol/L (ref 135–146)
Total Bilirubin: 0.5 mg/dL (ref 0.2–1.2)
Total Protein: 6.7 g/dL (ref 6.1–8.1)

## 2019-12-05 LAB — CBC WITH DIFFERENTIAL/PLATELET
Absolute Monocytes: 628 cells/uL (ref 200–950)
Basophils Absolute: 29 cells/uL (ref 0–200)
Basophils Relative: 0.4 %
Eosinophils Absolute: 139 cells/uL (ref 15–500)
Eosinophils Relative: 1.9 %
HCT: 34 % — ABNORMAL LOW (ref 38.5–50.0)
Hemoglobin: 11.2 g/dL — ABNORMAL LOW (ref 13.2–17.1)
Lymphs Abs: 1321 cells/uL (ref 850–3900)
MCH: 30.9 pg (ref 27.0–33.0)
MCHC: 32.9 g/dL (ref 32.0–36.0)
MCV: 93.7 fL (ref 80.0–100.0)
MPV: 10.5 fL (ref 7.5–12.5)
Monocytes Relative: 8.6 %
Neutro Abs: 5183 cells/uL (ref 1500–7800)
Neutrophils Relative %: 71 %
Platelets: 128 10*3/uL — ABNORMAL LOW (ref 140–400)
RBC: 3.63 10*6/uL — ABNORMAL LOW (ref 4.20–5.80)
RDW: 13.2 % (ref 11.0–15.0)
Total Lymphocyte: 18.1 %
WBC: 7.3 10*3/uL (ref 3.8–10.8)

## 2019-12-05 LAB — LIPID PANEL
Cholesterol: 133 mg/dL (ref <200)
HDL: 36 mg/dL — ABNORMAL LOW (ref >=40)
LDL Cholesterol (Calc): 68 mg/dL (calc)
Non-HDL Cholesterol (Calc): 97 mg/dL (calc) (ref <130)
Total CHOL/HDL Ratio: 3.7 (calc) (ref <5.0)
Triglycerides: 217 mg/dL — ABNORMAL HIGH (ref <150)

## 2019-12-05 LAB — HEMOGLOBIN A1C W/OUT EAG: Hgb A1c MFr Bld: 8.3 % of total Hgb — ABNORMAL HIGH (ref <5.7)

## 2019-12-05 LAB — PSA: PSA: 0.8 ng/mL (ref <=4.0)

## 2019-12-11 ENCOUNTER — Ambulatory Visit: Payer: Medicare HMO | Admitting: Cardiovascular Disease

## 2019-12-15 ENCOUNTER — Encounter: Payer: Self-pay | Admitting: *Deleted

## 2019-12-15 MED ORDER — TRULICITY 0.75 MG/0.5ML ~~LOC~~ SOAJ: 0.7500 mg | SUBCUTANEOUS | 0 refills | Status: AC

## 2019-12-15 MED ORDER — TRULICITY 1.5 MG/0.5ML ~~LOC~~ SOAJ: 1.5000 mg | SUBCUTANEOUS | 11 refills | Status: DC

## 2019-12-26 ENCOUNTER — Ambulatory Visit: Payer: Medicare HMO | Admitting: Student

## 2019-12-29 ENCOUNTER — Other Ambulatory Visit: Payer: Self-pay | Admitting: Family Medicine

## 2020-01-05 ENCOUNTER — Other Ambulatory Visit: Payer: Self-pay | Admitting: Family Medicine

## 2020-01-06 ENCOUNTER — Other Ambulatory Visit: Payer: Self-pay

## 2020-01-06 ENCOUNTER — Ambulatory Visit (INDEPENDENT_AMBULATORY_CARE_PROVIDER_SITE_OTHER): Payer: Medicare HMO

## 2020-01-06 DIAGNOSIS — Z23 Encounter for immunization: Secondary | ICD-10-CM | POA: Diagnosis not present

## 2020-01-14 ENCOUNTER — Encounter: Payer: Self-pay | Admitting: Family Medicine

## 2020-01-17 ENCOUNTER — Encounter: Payer: Self-pay | Admitting: Family Medicine

## 2020-02-04 ENCOUNTER — Encounter: Payer: Self-pay | Admitting: Family Medicine

## 2020-02-15 ENCOUNTER — Encounter: Payer: Self-pay | Admitting: Family Medicine

## 2020-02-16 MED ORDER — POTASSIUM CHLORIDE CRYS ER 20 MEQ PO TBCR: 20.0000 meq | EXTENDED_RELEASE_TABLET | Freq: Every day | ORAL | 3 refills | Status: DC

## 2020-02-27 ENCOUNTER — Other Ambulatory Visit: Payer: Self-pay | Admitting: *Deleted

## 2020-02-27 DIAGNOSIS — R29898 Other symptoms and signs involving the musculoskeletal system: Secondary | ICD-10-CM

## 2020-03-19 ENCOUNTER — Other Ambulatory Visit: Payer: Self-pay | Admitting: Family Medicine

## 2020-03-20 ENCOUNTER — Other Ambulatory Visit: Payer: Self-pay | Admitting: Family Medicine

## 2020-03-21 IMAGING — US US CAROTID DUPLEX BILAT
1 series · 13 of 24 positions shown · non-contrast
Comparison: 12/17/2015

CLINICAL DATA: Carotid atherosclerosis

EXAM:
BILATERAL CAROTID DUPLEX ULTRASOUND
TECHNIQUE: Gray scale imaging, color Doppler and duplex ultrasound were
performed of bilateral carotid and vertebral arteries in the neck.

[Series 1: us carotid duplex bilat · 0.06mm/px · 13 of 92 slices shown]
[im 1/92]
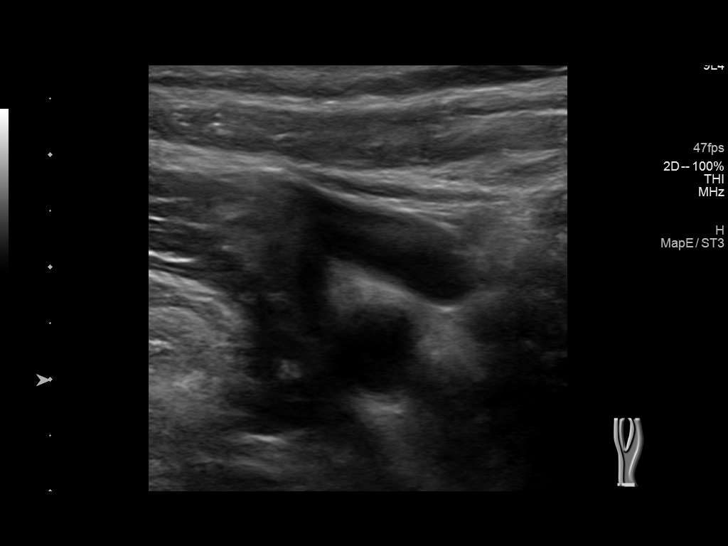
[im 8/92]
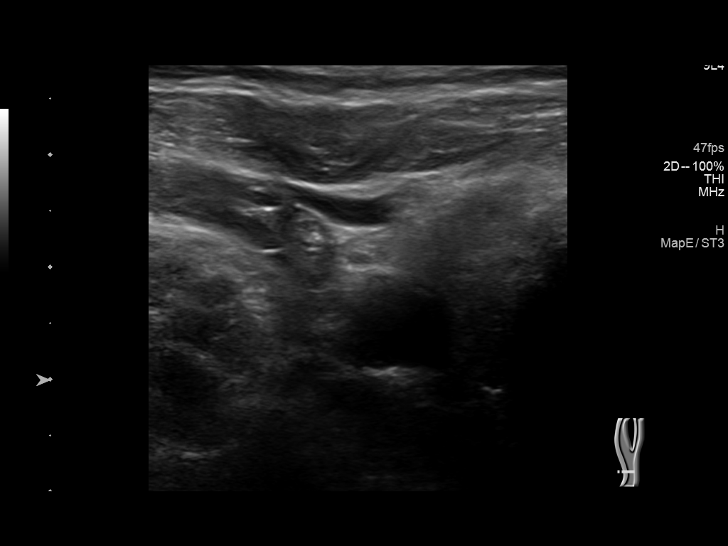
[im 16/92]
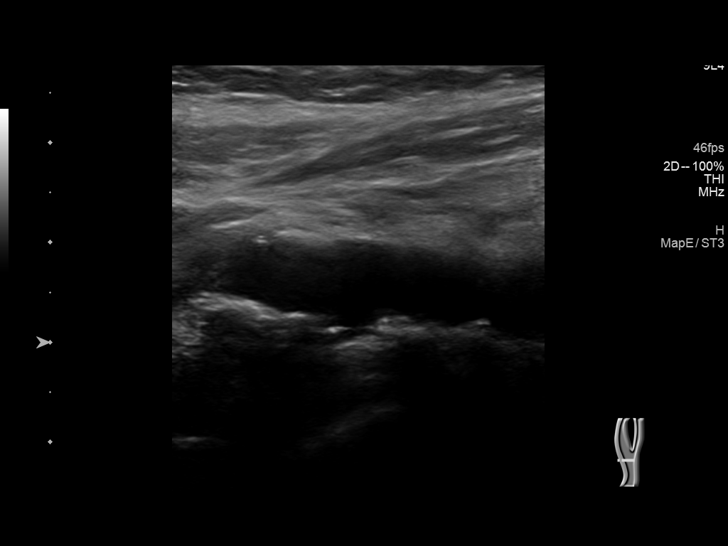
[im 24/92]
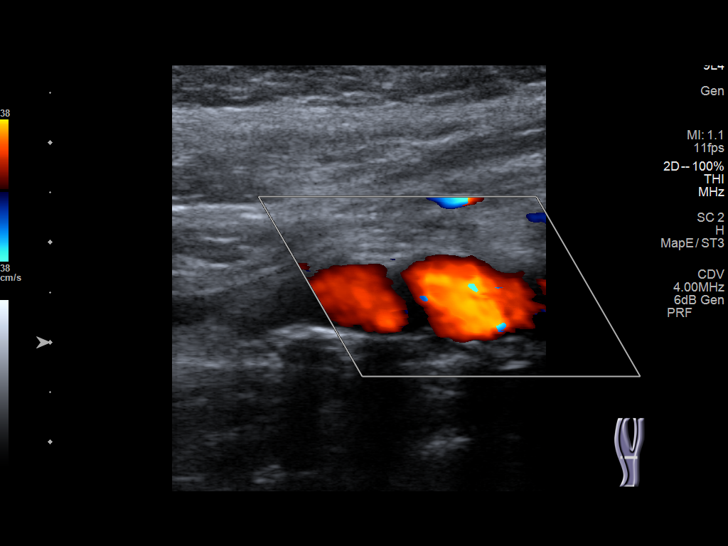
[im 32/92]
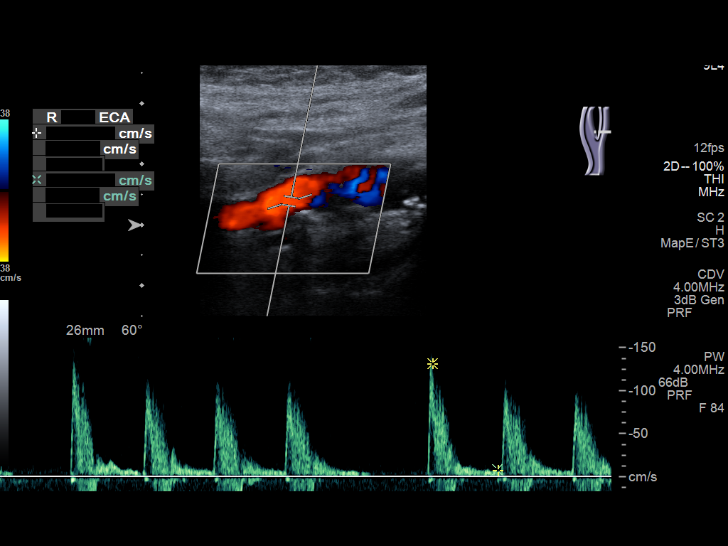
[im 40/92]
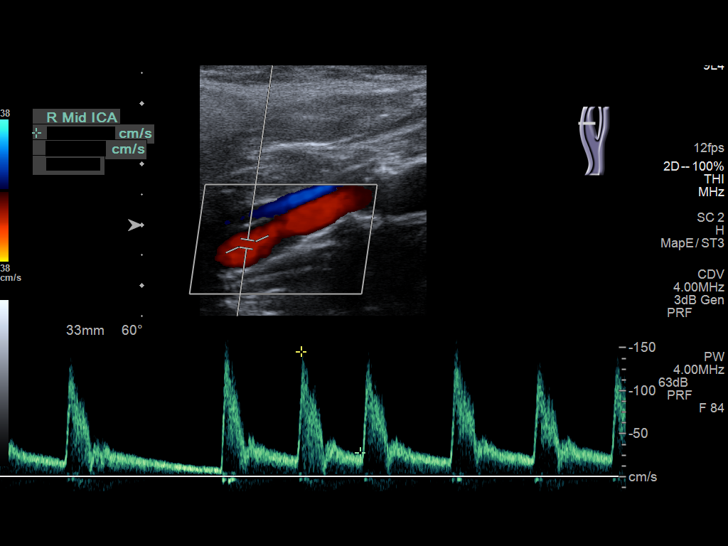
[im 48/92]
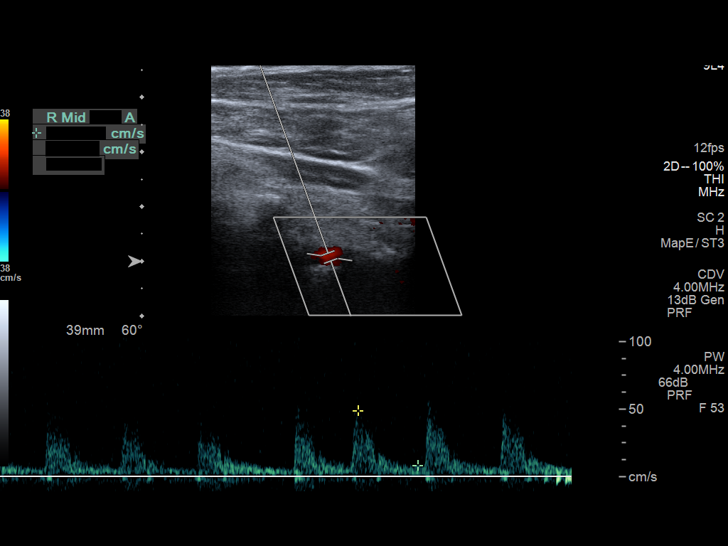
[im 52/92]
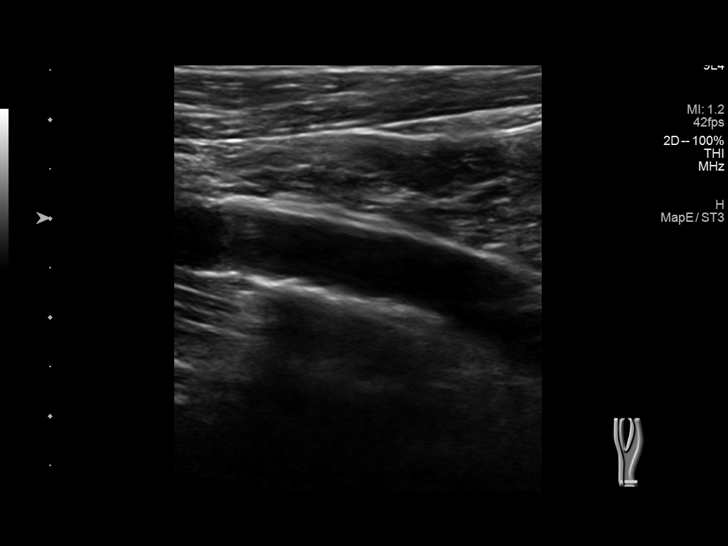
[im 60/92]
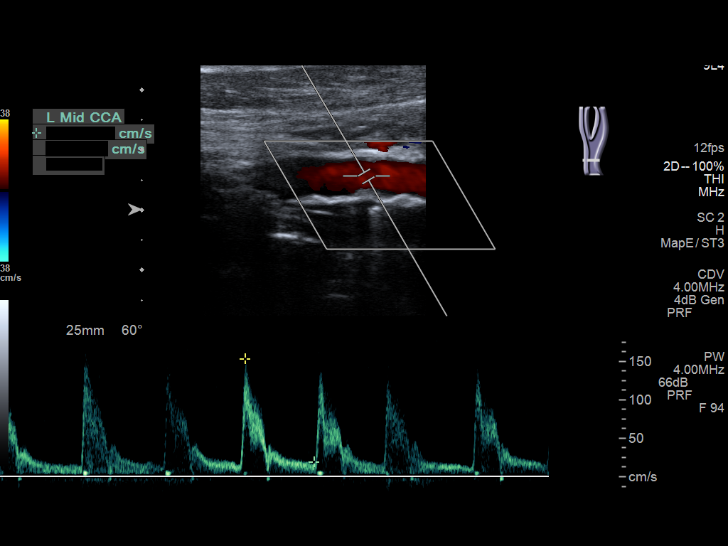
[im 68/92]
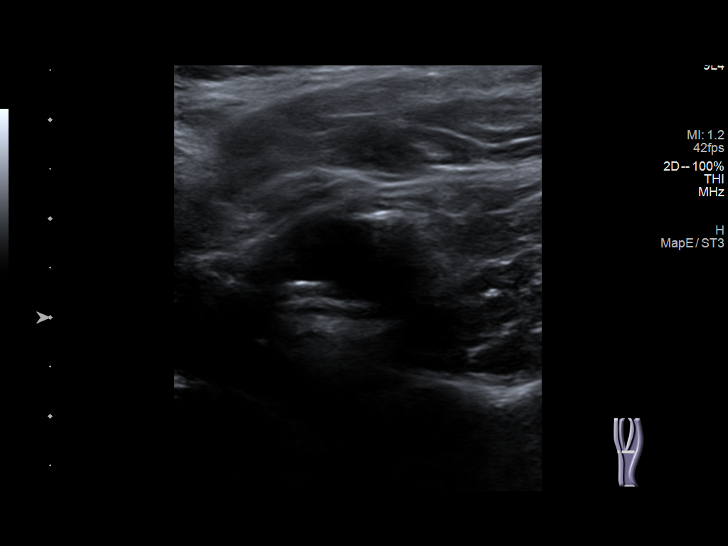
[im 76/92]
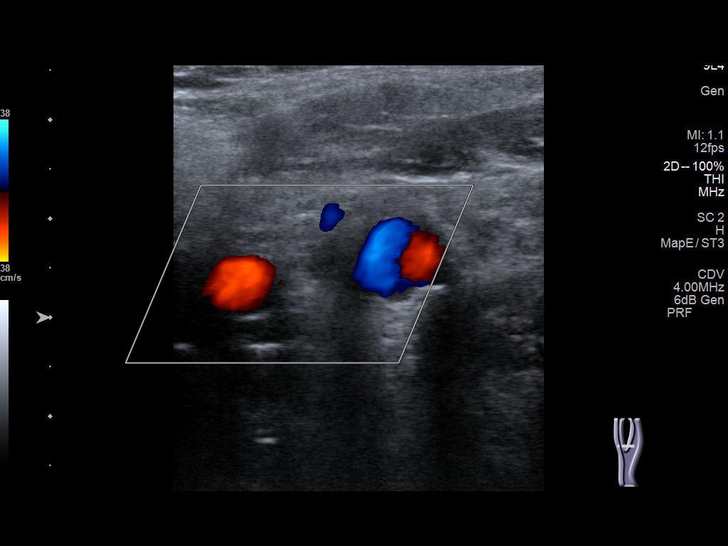
[im 84/92]
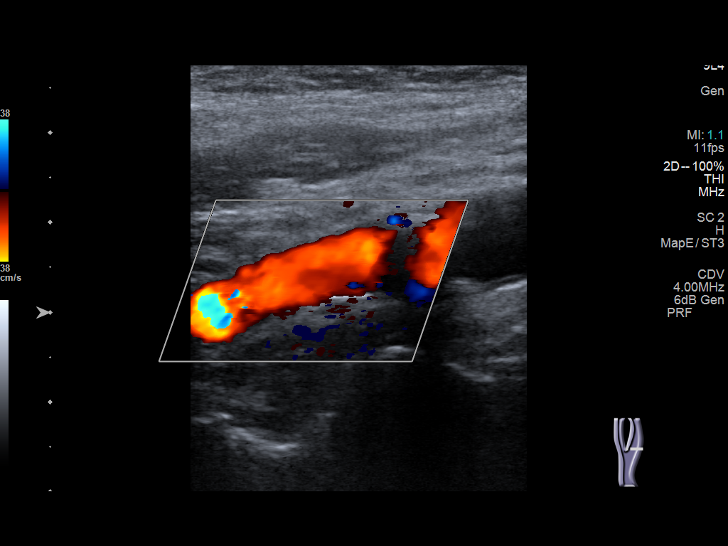
[im 92/92]
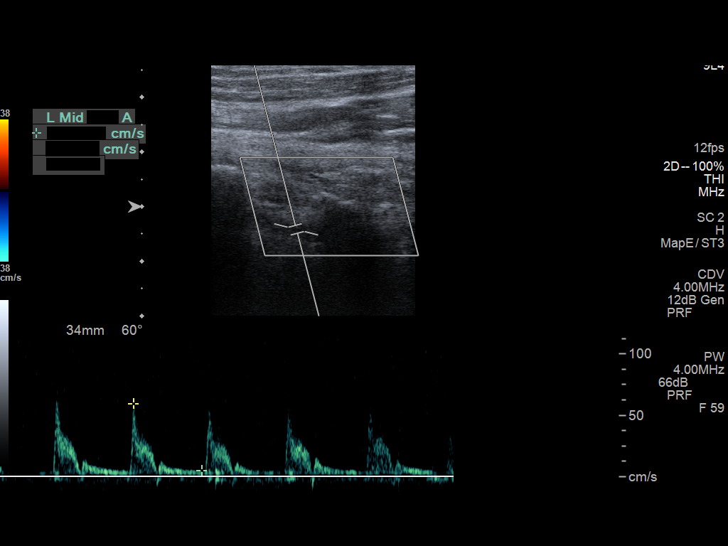

[13 of 24 positions shown; findings below may reference images not displayed]

FINDINGS: Criteria: Quantification of carotid stenosis is based on velocity
parameters that correlate the residual internal carotid diameter
with NASCET-based stenosis levels, using the diameter of the distal
internal carotid lumen as the denominator for stenosis measurement.

The following velocity measurements were obtained:

RIGHT

ICA: 145/24 cm/sec

CCA: 129/17 cm/sec

SYSTOLIC ICA/CCA RATIO:

ECA: 131 cm/sec

LEFT

ICA: 126/21 cm/sec

CCA: 153/19 cm/sec

SYSTOLIC ICA/CCA RATIO:

ECA: 122 cm/sec

RIGHT CAROTID ARTERY: Minor echogenic shadowing plaque formation. No
hemodynamically significant right ICA stenosis, velocity elevation,
or turbulent flow. Degree of narrowing less than 50%.

RIGHT VERTEBRAL ARTERY:  Antegrade

LEFT CAROTID ARTERY: Similar scattered minor echogenic plaque
formation. No hemodynamically significant left ICA stenosis,
velocity elevation, or turbulent flow.

LEFT VERTEBRAL ARTERY:  Antegrade
IMPRESSION: Minor carotid atherosclerosis. No hemodynamically significant ICA
stenosis. Degree of narrowing less than 50% bilaterally by
ultrasound criteria.

Patent antegrade vertebral flow bilaterally

## 2020-05-05 ENCOUNTER — Ambulatory Visit: Payer: Medicare HMO | Admitting: Physician Assistant

## 2020-05-05 ENCOUNTER — Ambulatory Visit (HOSPITAL_COMMUNITY)
Admission: RE | Admit: 2020-05-05 | Discharge: 2020-05-05 | Disposition: A | Discharge status: Home / Self Care | Payer: Medicare HMO | Source: Ambulatory Visit | Attending: Vascular Surgery | Admitting: Vascular Surgery

## 2020-05-05 ENCOUNTER — Other Ambulatory Visit: Payer: Self-pay

## 2020-05-05 VITALS — BP 171/77 | HR 79 | Temp 98.2°F | Resp 20 | Ht 70.0 in | Wt 253.8 lb

## 2020-05-05 DIAGNOSIS — I714 Abdominal aortic aneurysm, without rupture, unspecified: Secondary | ICD-10-CM

## 2020-05-05 NOTE — Progress Notes (Signed)
HISTORY AND PHYSICAL     CC:  follow up. Requesting Provider:  Susy Frizzle, MD  HPI: This is a 80 y.o. male who is here today for follow up for AAA.  He is here today for 6 month follow up.  He was initially seen by Dr. Kellie Simmering in 2013 for AAA.   Pt was last seen August 2021 and at that time, he was doing well without any claudication, rest pain or wounds.  He did not have any back or abdominal pain.  He had hx of CKD with creatinine of 2.2 and this is managed by Dr. Dennard Schaumann his PCP.    The pt returns today for follow up AAA duplex.  Pt states he does have some back pain but this has been present for a couple of years.  It has stayed the same and not gotten better or worse.  He states he gets some pain in his low back when walking.  He states it is worse on an incline than if he is going flat or down hill.  He does have swelling in his feet.  He is diabetic.  He states that his blood pressure is usually pretty well controlled but is elevated today.  His wife states that he gets short of breath with activity.   She tells me that Dr. Dennard Schaumann heard a bruit on the right carotid bruit.  The pt denies any stroke sx specifically visual, speech difficulties, facial droop or unilateral weakness, numbness or paralysis.  He states he has neuropathy in his feet.  His creatinine at last check in September was 1.63.    The pt is on a statin for cholesterol management.    The pt is on an aspirin.    Other AC:  none The pt is on ACEI, BB for hypertension.  The pt does have diabetes. Tobacco hx:  Former-quit 1989    Past Medical History:  Diagnosis Date  . AAA (abdominal aortic aneurysm) ([place]) 8/12   3.5cm  . CAD (coronary artery disease)   . CKD (chronic kidney disease) stage 3, GFR 30-59 ml/min (HCC)   . Colon polyps   . Diabetes mellitus without complication ([place])   . 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 Hyperlipidemia   . Hypertension   . 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 Neuromuscular disorder ([place])    L2-3,L5-S1 bulging disc /nerve impingement  . PAD (peripheral artery disease) ([place])     Past Surgical History:  Procedure Laterality Date  . COLONOSCOPY N/A 09/27/2012   Procedure: COLONOSCOPY;  Surgeon: Daneil Dolin, MD;  Location: [facility];  Service: Endoscopy;  Laterality: N/A;  9:30  . CORONARY ANGIOPLASTY     1989  . TONSILLECTOMY      Allergies  Allergen Reactions  . Jardiance [Empagliflozin]     Severe nausea-     Current Outpatient Medications  Medication Sig Dispense Refill  . allopurinol (ZYLOPRIM) 300 MG tablet TAKE 1 TABLET EVERY DAY 90 tablet 3  . aspirin EC 81 MG tablet Take 81 mg by mouth daily.    Marland Kitchen azithromycin (ZITHROMAX) 250 MG tablet 2 tabs poqday1, 1 tab poqday 2-5 6 tablet 0  . Blood Glucose Monitoring Suppl (TRUE METRIX METER) w/Device KIT Use as Directed 1 kit 1  . diltiazem (CARDIZEM CD) 240 MG 24 hr capsule TAKE 1 CAPSULE EVERY DAY 90 capsule 1  . doxazosin (CARDURA)  4 MG tablet TAKE 1 TABLET EVERY DAY 90 tablet 2  . Dulaglutide (TRULICITY) 1.5 OA/4.1YS SOPN Inject 1.5 mg into the skin once a week. Begin after 0.23m completed. 4 mL 11  . furosemide (LASIX) 40 MG tablet Take 1 tablet (40 mg total) by mouth daily. 30 tablet 3  . glipiZIDE (GLUCOTROL) 5 MG tablet TAKE 1 TABLET TWO TIMES DAILY BEFORE  MEALS 180 tablet 2  . ketorolac (ACULAR) 0.5 % ophthalmic solution Place 1 drop into the left eye 2 (two) times daily. 5 mL 4  . Lancet Devices (PRODIGY LANCING DEVICE) MISC Use bid DX e11.9 1 each 2  . lisinopril (ZESTRIL) 40 MG tablet TAKE 1 TABLET EVERY DAY 90 tablet 3  . metoprolol succinate (TOPROL-XL) 25 MG 24 hr tablet Take 1 tablet (25 mg total) by mouth daily. 90 tablet 3  . Multiple Vitamin (MULTIVITAMIN WITH MINERALS) TABS Take 1 tablet by mouth daily.    . Omega-3 Fatty Acids (FISH OIL PO) Take by mouth.    . pantoprazole (PROTONIX) 20 MG tablet Take 1  tablet (20 mg total) by mouth daily. 90 tablet 3  . potassium chloride SA (KLOR-CON) 20 MEQ tablet Take 1 tablet (20 mEq total) by mouth daily. 90 tablet 3  . rosuvastatin (CRESTOR) 20 MG tablet TAKE 1 TABLET EVERY DAY 90 tablet 1  . TRUE METRIX BLOOD GLUCOSE TEST test strip CHECK BLOOD SUGAR EVERY DAY 100 strip 1  . TRUEPLUS LANCETS 33G MISC Check BS QD - dx: e11.9 100 each 3   No current facility-administered medications for this visit.    Family History  Problem Relation Age of Onset  . Diabetes Mother   . Heart disease Mother        After age 82 . Heart attack Mother   . Hypertension Mother   . Diabetes Father   . Heart disease Father        After age 82 . Hypertension Father   . Heart attack Father   . Brain cancer Sister   . Cancer Sister        Brain  . Diabetes Sister   . Hypertension Sister   . Cancer Brother        Chest Tumor  . Diabetes Brother   . Hypertension Brother   . Deep vein thrombosis Sister   . Diabetes Sister   . Diabetes Brother        Bilateral leg  . Colon cancer Neg Hx     Social History   Socioeconomic History  . Marital status: Married    Spouse name: Not on file  . Number of children: Not on file  . Years of education: Not on file  . Highest education level: Not on file  Occupational History  . Occupation: retired    EFish farm manager RETIRED    Comment: [employer]   Tobacco Use  . Smoking status: Former Smoker    Years: 35.00    Types: Cigarettes    Quit date: 01/02/1988    Years since quitting: 32.3  . Smokeless tobacco: Never Used  Vaping Use  . Vaping Use: Never used  Substance and Sexual Activity  . Alcohol use: Not Currently    Comment: rare beer, no history of ETOH abuse  . Drug use: No  . Sexual activity: Not on file  Other Topics Concern  . Not on file  Social History Narrative  . Not on file   Social Determinants of Health   Financial  Resource Strain: Not on file  Food Insecurity: Not on file  Transportation  Needs: Not on file  Physical Activity: Not on file  Stress: Not on file  Social Connections: Not on file  Intimate Partner Violence: Not on file     REVIEW OF SYSTEMS:   [X] denotes positive finding, [ ] denotes negative finding Cardiac  Comments:  Chest pain or chest pressure:    Shortness of breath upon exertion: x   Short of breath when lying flat:    Irregular heart rhythm:        Vascular    Pain in calf, thigh, or hip brought on by ambulation:    Pain in feet at night that wakes you up from your sleep:     Blood clot in your veins:    Leg swelling:         Pulmonary    Oxygen at home:    Productive cough:     Wheezing:         Neurologic    Sudden weakness in arms or legs:     Sudden numbness in arms or legs:     Sudden onset of difficulty speaking or slurred speech:    Temporary loss of vision in one eye:     Problems with dizziness:         Gastrointestinal    Blood in stool:     Vomited blood:         Genitourinary    Burning when urinating:     Blood in urine:        Psychiatric    Major depression:         Hematologic    Bleeding problems:    Problems with blood clotting too easily:        Skin    Rashes or ulcers:        Constitutional    Fever or chills:      PHYSICAL EXAMINATION:  Today's Vitals   05/05/20 0923  BP: (!) 171/77  Pulse: 79  Resp: 20  Temp: 98.2 F (36.8 C)  TempSrc: Temporal  SpO2: 99%  Weight: 253 lb 12.8 oz (115.1 kg)  Height: 5' 10" (1.778 m)   Body mass index is 36.42 kg/m.   General:  WDWN in NAD; vital signs documented above Gait: Not observed HENT: WNL, normocephalic Pulmonary: normal non-labored breathing , without wheezing Cardiac: regular HR, without  Murmur; without carotid bruits Abdomen: soft, NT, no masses; aortic pulse is not palpable Skin: without rashes Vascular Exam/Pulses:  Right Left  Radial 2+ (normal) 2+ (normal)  Femoral Unable to palpate due to body habitus Unable to palpate due  to body habitus   Extremities: unable to examine feet as he did not want to remove his shoes.  He does have BLE edema Musculoskeletal: no muscle wasting or atrophy  Neurologic: A&O X 3;  No focal weakness or paresthesias are detected Psychiatric:  The pt has Normal affect.   Non-Invasive Vascular Imaging:   AAA Arterial duplex on 05/05/2020: Abdominal Aorta Findings:  +-----------+-------+----------+----------+--------+--------+--------+  Location  AP (cm)Trans (cm)PSV (cm/s)WaveformThrombusComments  +-----------+-------+----------+----------+--------+--------+--------+  Proximal  2.27  2.02   55                  +-----------+-------+----------+----------+--------+--------+--------+  Mid    4.98  4.87   88                  +-----------+-------+----------+----------+--------+--------+--------+  Distal   2.72  2.61                      +-----------+-------+----------+----------+--------+--------+--------+  RT CIA Prox1.0  1.2    484                  +-----------+-------+----------+----------+--------+--------+--------+  LT CIA Prox0.9  1.0    249                  +-----------+-------+----------+----------+--------+--------+--------+   Summary:  Abdominal Aorta: There is evidence of abnormal dilatation of the mid  Abdominal aorta. The largest aortic diameter has increased compared to prior exam. Previous diameter measurement was 4.57 x 4.55 cm obtained on 10/17/19.  Stenosis: +------------------+-------------+  Location     Stenosis     +------------------+-------------+  Right Common Iliac>50% stenosis  +------------------+-------------+  Left Common Iliac >50% stenosis  +------------------+-------------+    Previous carotid duplex on 12/02/2018: < 50% bilateral ICA stenosis   ASSESSMENT/PLAN:: 79 y.o. male here  for follow up for AAA  AAA -AAA measures 4.98 today, which is an increase of 4mm since 6 months ago.  He remains asymptomatic.  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  He states he gets back pain when walking up an incline.  It is difficult to determine if this is claudication as he does not have cramping in his buttocks or thighs. -discussed results with Dr. Dickson and given the iliac stenosis and slight enlargement of AAA, will have him return in 6 months with ABI and aortoiliac duplex and see Dr.  Dickson.  He does have hx of CKD.  Discussed with pt if he develops sudden onset of sharp abdominal or back pain, he should call 911.  Discussed there is a chance of rupture and this would be a life threatening situation.  I have advised him to check his blood pressure twice daily and record it and take it when he sees Dr. Pickard next month to see if he needs any adjustments to his medications.    Carotid bruit -pt has hx of right carotid bruit.  He remains asymptomatic.  He had carotid duplex in 2020.  Will repeat this when he returns in 6 months.  Discussed sx of stroke and he knows to call 911 should he develop any.   Samantha Rhyne, PAC [facility] [phone]  Clinic MD:   Discussed with Dr. Dickson 

## 2020-05-10 ENCOUNTER — Other Ambulatory Visit: Payer: Self-pay | Admitting: *Deleted

## 2020-05-10 DIAGNOSIS — I714 Abdominal aortic aneurysm, without rupture, unspecified: Secondary | ICD-10-CM

## 2020-05-10 DIAGNOSIS — I6521 Occlusion and stenosis of right carotid artery: Secondary | ICD-10-CM

## 2020-05-10 DIAGNOSIS — I739 Peripheral vascular disease, unspecified: Secondary | ICD-10-CM

## 2020-05-14 ENCOUNTER — Other Ambulatory Visit: Payer: Self-pay

## 2020-05-14 ENCOUNTER — Other Ambulatory Visit: Payer: Medicare HMO

## 2020-05-14 DIAGNOSIS — I10 Essential (primary) hypertension: Secondary | ICD-10-CM

## 2020-05-14 DIAGNOSIS — I2510 Atherosclerotic heart disease of native coronary artery without angina pectoris: Secondary | ICD-10-CM | POA: Diagnosis not present

## 2020-05-14 DIAGNOSIS — E7800 Pure hypercholesterolemia, unspecified: Secondary | ICD-10-CM

## 2020-05-14 DIAGNOSIS — E119 Type 2 diabetes mellitus without complications: Secondary | ICD-10-CM

## 2020-05-14 DIAGNOSIS — N183 Chronic kidney disease, stage 3 unspecified: Secondary | ICD-10-CM

## 2020-05-15 LAB — HEMOGLOBIN A1C
Hgb A1c MFr Bld: 6.8 % of total Hgb — ABNORMAL HIGH (ref <5.7)
Mean Plasma Glucose: 148 mg/dL
eAG (mmol/L): 8.2 mmol/L

## 2020-05-15 LAB — LIPID PANEL
Cholesterol: 120 mg/dL (ref <200)
HDL: 37 mg/dL — ABNORMAL LOW (ref >=40)
LDL Cholesterol (Calc): 60 mg/dL (calc)
Non-HDL Cholesterol (Calc): 83 mg/dL (calc) (ref <130)
Total CHOL/HDL Ratio: 3.2 (calc) (ref <5.0)
Triglycerides: 156 mg/dL — ABNORMAL HIGH (ref <150)

## 2020-05-15 LAB — COMPLETE METABOLIC PANEL WITH GFR
AG Ratio: 1.9 (calc) (ref 1.0–2.5)
ALT: 21 U/L (ref 9–46)
AST: 19 U/L (ref 10–35)
Albumin: 4.1 g/dL (ref 3.6–5.1)
Alkaline phosphatase (APISO): 70 U/L (ref 35–144)
BUN/Creatinine Ratio: 14 (calc) (ref 6–22)
BUN: 22 mg/dL (ref 7–25)
CO2: 24 mmol/L (ref 20–32)
Calcium: 9 mg/dL (ref 8.6–10.3)
Chloride: 104 mmol/L (ref 98–110)
Creat: 1.56 mg/dL — ABNORMAL HIGH (ref 0.70–1.11)
GFR, Est African American: 48 mL/min/{1.73_m2} — ABNORMAL LOW (ref >=60)
GFR, Est Non African American: 41 mL/min/{1.73_m2} — ABNORMAL LOW (ref >=60)
Globulin: 2.2 g/dL (calc) (ref 1.9–3.7)
Glucose, Bld: 148 mg/dL — ABNORMAL HIGH (ref 65–99)
Potassium: 4.6 mmol/L (ref 3.5–5.3)
Sodium: 138 mmol/L (ref 135–146)
Total Bilirubin: 0.4 mg/dL (ref 0.2–1.2)
Total Protein: 6.3 g/dL (ref 6.1–8.1)

## 2020-05-15 LAB — CBC WITH DIFFERENTIAL/PLATELET
Absolute Monocytes: 554 cells/uL (ref 200–950)
Basophils Absolute: 40 cells/uL (ref 0–200)
Basophils Relative: 0.6 %
Eosinophils Absolute: 152 cells/uL (ref 15–500)
Eosinophils Relative: 2.3 %
HCT: 34.8 % — ABNORMAL LOW (ref 38.5–50.0)
Hemoglobin: 11.6 g/dL — ABNORMAL LOW (ref 13.2–17.1)
Lymphs Abs: 1670 cells/uL (ref 850–3900)
MCH: 31.4 pg (ref 27.0–33.0)
MCHC: 33.3 g/dL (ref 32.0–36.0)
MCV: 94.1 fL (ref 80.0–100.0)
MPV: 9.9 fL (ref 7.5–12.5)
Monocytes Relative: 8.4 %
Neutro Abs: 4184 cells/uL (ref 1500–7800)
Neutrophils Relative %: 63.4 %
Platelets: 145 10*3/uL (ref 140–400)
RBC: 3.7 10*6/uL — ABNORMAL LOW (ref 4.20–5.80)
RDW: 13.1 % (ref 11.0–15.0)
Total Lymphocyte: 25.3 %
WBC: 6.6 10*3/uL (ref 3.8–10.8)

## 2020-05-15 LAB — MICROALBUMIN / CREATININE URINE RATIO
Creatinine, Urine: 116 mg/dL (ref 20–320)
Microalb Creat Ratio: 300 mcg/mg creat — ABNORMAL HIGH (ref <30)
Microalb, Ur: 34.8 mg/dL

## 2020-05-18 ENCOUNTER — Encounter: Payer: Self-pay | Admitting: Family Medicine

## 2020-05-18 ENCOUNTER — Other Ambulatory Visit: Payer: Self-pay

## 2020-05-18 ENCOUNTER — Ambulatory Visit (INDEPENDENT_AMBULATORY_CARE_PROVIDER_SITE_OTHER): Payer: Medicare HMO | Admitting: Family Medicine

## 2020-05-18 VITALS — BP 162/82 | HR 80 | Temp 98.1°F | Ht 70.0 in | Wt 257.6 lb

## 2020-05-18 DIAGNOSIS — I2510 Atherosclerotic heart disease of native coronary artery without angina pectoris: Secondary | ICD-10-CM

## 2020-05-18 DIAGNOSIS — E119 Type 2 diabetes mellitus without complications: Secondary | ICD-10-CM | POA: Diagnosis not present

## 2020-05-18 DIAGNOSIS — I10 Essential (primary) hypertension: Secondary | ICD-10-CM

## 2020-05-18 DIAGNOSIS — N1832 Chronic kidney disease, stage 3b: Secondary | ICD-10-CM | POA: Diagnosis not present

## 2020-05-18 DIAGNOSIS — E1121 Type 2 diabetes mellitus with diabetic nephropathy: Secondary | ICD-10-CM

## 2020-05-18 MED ORDER — DOXAZOSIN MESYLATE 8 MG PO TABS: 8.0000 mg | ORAL_TABLET | Freq: Every day | ORAL | 0 refills | Status: DC

## 2020-05-18 NOTE — Progress Notes (Signed)
Subjective:    Patient ID: Juan [name], male    DOB: May 25, 1940, 80 y.o.   MRN: [id number]  Patient is here today for a checkup on his diabetes.  He is already on lisinopril 40 mg a day.  He had a terrible reaction to Jardiance in the past.  His most recent lab work is significant for an elevated microalbumin to creatinine ratio of 300.  We discussed switching glipizide to [place] to try to help prevent further damage to his kidneys however cost is certainly an issue for the patient and he had a poor reaction to [place].  Therefore we decided not to do this.  However his blood pressure has been running high at home.  His average systolic blood pressure is probably around 130-140 however at least 50% of the time his systolic blood pressure is 150-160.  Sometimes it is as low as 120.  I think if we can lower his blood pressure this would help prevent further kidney damage.  He denies any hypoglycemia or polyuria or polydipsia.  His most recent lab work is listed below Lab on 05/14/2020  Component Date Value Ref Range Status  . WBC 05/14/2020 6.6  3.8 - 10.8 Thousand/uL Final  . RBC 05/14/2020 3.70* 4.20 - 5.80 Million/uL Final  . Hemoglobin 05/14/2020 11.6* 13.2 - 17.1 g/dL Final  . HCT 05/14/2020 34.8* 38.5 - 50.0 % Final  . MCV 05/14/2020 94.1  80.0 - 100.0 fL Final  . MCH 05/14/2020 31.4  27.0 - 33.0 pg Final  . MCHC 05/14/2020 33.3  32.0 - 36.0 g/dL Final  . RDW 05/14/2020 13.1  11.0 - 15.0 % Final  . Platelets 05/14/2020 145  140 - 400 Thousand/uL Final  . MPV 05/14/2020 9.9  7.5 - 12.5 fL Final  . Neutro Abs 05/14/2020 4,184  1,500 - 7,800 cells/uL Final  . Lymphs Abs 05/14/2020 1,670  850 - 3,900 cells/uL Final  . Absolute Monocytes 05/14/2020 554  200 - 950 cells/uL Final  . Eosinophils Absolute 05/14/2020 152  15 - 500 cells/uL Final  . Basophils Absolute 05/14/2020 40  0 - 200 cells/uL Final  . Neutrophils Relative % 05/14/2020 63.4  % Final  . Total Lymphocyte 05/14/2020 25.3   % Final  . Monocytes Relative 05/14/2020 8.4  % Final  . Eosinophils Relative 05/14/2020 2.3  % Final  . Basophils Relative 05/14/2020 0.6  % Final  . Glucose, Bld 05/14/2020 148* 65 - 99 mg/dL Final   Comment: .            Fasting reference interval . For someone without known diabetes, a glucose value >125 mg/dL indicates that they may have diabetes and this should be confirmed with a follow-up test. .   . BUN 05/14/2020 22  7 - 25 mg/dL Final  . Creat 05/14/2020 1.56* 0.70 - 1.11 mg/dL Final   Comment: For patients >63 years of age, the reference limit for Creatinine is approximately 13% higher for people identified as African-American. .   . GFR, Est Non African American 05/14/2020 41* > OR = 60 mL/min/1.66m Final  . GFR, Est African American 05/14/2020 48* > OR = 60 mL/min/1.739mFinal  . BUN/Creatinine Ratio 05/14/2020 14  6 - 22 (calc) Final  . Sodium 05/14/2020 138  135 - 146 mmol/L Final  . Potassium 05/14/2020 4.6  3.5 - 5.3 mmol/L Final  . Chloride 05/14/2020 104  98 - 110 mmol/L Final  . CO2 05/14/2020 24  20 - 32 mmol/L  Final  . Calcium 05/14/2020 9.0  8.6 - 10.3 mg/dL Final  . Total Protein 05/14/2020 6.3  6.1 - 8.1 g/dL Final  . Albumin 05/14/2020 4.1  3.6 - 5.1 g/dL Final  . Globulin 05/14/2020 2.2  1.9 - 3.7 g/dL (calc) Final  . AG Ratio 05/14/2020 1.9  1.0 - 2.5 (calc) Final  . Total Bilirubin 05/14/2020 0.4  0.2 - 1.2 mg/dL Final  . Alkaline phosphatase (APISO) 05/14/2020 70  35 - 144 U/L Final  . AST 05/14/2020 19  10 - 35 U/L Final  . ALT 05/14/2020 21  9 - 46 U/L Final  . Hgb A1c MFr Bld 05/14/2020 6.8* <5.7 % of total Hgb Final   Comment: For someone without known diabetes, a hemoglobin A1c value of 6.5% or greater indicates that they may have  diabetes and this should be confirmed with a follow-up  test. . For someone with known diabetes, a value <7% indicates  that their diabetes is well controlled and a value  greater than or equal to 7%  indicates suboptimal  control. A1c targets should be individualized based on  duration of diabetes, age, comorbid conditions, and  other considerations. . Currently, no consensus exists regarding use of hemoglobin A1c for diagnosis of diabetes for children. .   . Mean Plasma Glucose 05/14/2020 148  mg/dL Final  . eAG (mmol/L) 05/14/2020 8.2  mmol/L Final  . Cholesterol 05/14/2020 120  <200 mg/dL Final  . HDL 05/14/2020 37* > OR = 40 mg/dL Final  . Triglycerides 05/14/2020 156* <150 mg/dL Final  . LDL Cholesterol (Calc) 05/14/2020 60  mg/dL (calc) Final   Comment: Reference range: <100 . Desirable range <100 mg/dL for primary prevention;   <70 mg/dL for patients with CHD or diabetic patients  with > or = 2 CHD risk factors. Marland Kitchen LDL-C is now calculated using the Martin-Hopkins  calculation, which is a validated novel method providing  better accuracy than the Friedewald equation in the  estimation of LDL-C.  Cresenciano Genre et al. Annamaria Helling. 2130;865(78): 2061-2068  (http://education.[employer].com/faq/FAQ164)   . Total CHOL/HDL Ratio 05/14/2020 3.2  <5.0 (calc) Final  . Non-HDL Cholesterol (Calc) 05/14/2020 83  <130 mg/dL (calc) Final   Comment: For patients with diabetes plus 1 major ASCVD risk  factor, treating to a non-HDL-C goal of <100 mg/dL  (LDL-C of <70 mg/dL) is considered a therapeutic  option.   . Creatinine, Urine 05/14/2020 116  20 - 320 mg/dL Final  . Microalb, Ur 05/14/2020 34.8  mg/dL Final   Comment: Verified by repeat analysis. Marland Kitchen Reference Range Not established   . Microalb Creat Ratio 05/14/2020 300* <30 mcg/mg creat Final   Comment: . The ADA defines abnormalities in albumin excretion as follows: Marland Kitchen Albuminuria Category        Result (mcg/mg creatinine) . Normal to Mildly increased   <30 Moderately increased         30-299  Severely increased           > OR = 300 . The ADA recommends that at least two of three specimens collected within a 3-6 month  period be abnormal before considering a patient to be within a diagnostic category.     Past Medical History:  Diagnosis Date  . AAA (abdominal aortic aneurysm) ([place]) 8/12   3.5cm  . CAD (coronary artery disease)   . CKD (chronic kidney disease) stage 3, GFR 30-59 ml/min (HCC)   . Colon polyps   . Diabetes mellitus without complication (  [place])   . 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 Hyperlipidemia   . Hypertension   . 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 Neuromuscular disorder ([place])    L2-3,L5-S1 bulging disc /nerve impingement  . PAD (peripheral artery disease) ([place])    Past Surgical History:  Procedure Laterality Date  . COLONOSCOPY N/A 09/27/2012   Procedure: COLONOSCOPY;  Surgeon: Daneil Dolin, MD;  Location[facility];  Service: Endoscopy;  Laterality: N/A;  9:30  . CORONARY ANGIOPLASTY     1989  . TONSILLECTOMY     Current Outpatient Medications on File Prior to Visit  Medication Sig Dispense Refill  . allopurinol (ZYLOPRIM) 300 MG tablet TAKE 1 TABLET EVERY DAY 90 tablet 3  . aspirin EC 81 MG tablet Take 81 mg by mouth daily.    . Blood Glucose Monitoring Suppl (TRUE METRIX METER) w/Device KIT Use as Directed 1 kit 1  . diltiazem (CARDIZEM CD) 240 MG 24 hr capsule TAKE 1 CAPSULE EVERY DAY 90 capsule 1  . Dulaglutide (TRULICITY) 1.5 LT/5.3UY SOPN Inject 1.5 mg into the skin once a week. Begin after 0.81m completed. 4 mL 11  . furosemide (LASIX) 40 MG tablet Take 1 tablet (40 mg total) by mouth daily. 30 tablet 3  . glipiZIDE (GLUCOTROL) 5 MG tablet TAKE 1 TABLET TWO TIMES DAILY BEFORE  MEALS 180 tablet 2  . ketorolac (ACULAR) 0.5 % ophthalmic solution Place 1 drop into the left eye 2 (two) times daily. 5 mL 4  . Lancet Devices (PRODIGY LANCING DEVICE) MISC Use bid DX e11.9 1 each 2  . lisinopril (ZESTRIL) 40 MG tablet TAKE 1 TABLET EVERY DAY 90 tablet 3  . metoprolol succinate (TOPROL-XL) 25 MG 24 hr  tablet Take 1 tablet (25 mg total) by mouth daily. 90 tablet 3  . Multiple Vitamin (MULTIVITAMIN WITH MINERALS) TABS Take 1 tablet by mouth daily.    . Omega-3 Fatty Acids (FISH OIL PO) Take by mouth.    . pantoprazole (PROTONIX) 20 MG tablet Take 1 tablet (20 mg total) by mouth daily. 90 tablet 3  . potassium chloride SA (KLOR-CON) 20 MEQ tablet Take 1 tablet (20 mEq total) by mouth daily. 90 tablet 3  . rosuvastatin (CRESTOR) 20 MG tablet TAKE 1 TABLET EVERY DAY 90 tablet 1  . TRUE METRIX BLOOD GLUCOSE TEST test strip CHECK BLOOD SUGAR EVERY DAY 100 strip 1  . TRUEPLUS LANCETS 33G MISC Check BS QD - dx: e11.9 100 each 3   No current facility-administered medications on file prior to visit.   Allergies  Allergen Reactions  . Jardiance [Empagliflozin]     Severe nausea-    Social History   Socioeconomic History  . Marital status: Married    Spouse name: Not on file  . Number of children: Not on file  . Years of education: Not on file  . Highest education level: Not on file  Occupational History  . Occupation: retired    EF[employer] farm manager RETIRED    Comment: [employer]   Tobacco Use  . Smoking status: Former Smoker    Years: 35.00    Types: Cigarettes    Quit date: 01/02/1988    Years since quitting: 32.3  . Smokeless tobacco: Never Used  Vaping Use  . Vaping Use: Never used  Substance and Sexual Activity  . Alcohol use: Not Currently    Comment: rare beer, no history of ETOH abuse  .  Drug use: No  . Sexual activity: Not on file  Other Topics Concern  . Not on file  Social History Narrative  . Not on file   Social Determinants of Health   Financial Resource Strain: Not on file  Food Insecurity: Not on file  Transportation Needs: Not on file  Physical Activity: Not on file  Stress: Not on file  Social Connections: Not on file  Intimate Partner Violence: Not on file   Family History  Problem Relation Age of Onset  . Diabetes Mother   . Heart disease Mother         After age 47  . Heart attack Mother   . Hypertension Mother   . Diabetes Father   . Heart disease Father        After age 88  . Hypertension Father   . Heart attack Father   . Brain cancer Sister   . Cancer Sister        Brain  . Diabetes Sister   . Hypertension Sister   . Cancer Brother        Chest Tumor  . Diabetes Brother   . Hypertension Brother   . Deep vein thrombosis Sister   . Diabetes Sister   . Diabetes Brother        Bilateral leg  . Colon cancer Neg Hx       Review of Systems  All other systems reviewed and are negative.      Objective:   Physical Exam Vitals reviewed.  Constitutional:      General: He is not in acute distress.    Appearance: He is well-developed. He is not diaphoretic.  HENT:     Head: Normocephalic and atraumatic.  Neck:     Thyroid: No thyromegaly.     Vascular: No JVD.     Trachea: No tracheal deviation.  Cardiovascular:     Rate and Rhythm: Normal rate and regular rhythm.     Heart sounds: Murmur heard.  No friction rub. No gallop.   Pulmonary:     Effort: Pulmonary effort is normal. No respiratory distress.     Breath sounds: Normal breath sounds. No stridor. No wheezing or rales.  Chest:     Chest wall: No tenderness.  Abdominal:     General: Bowel sounds are normal.     Palpations: Abdomen is soft.  Neurological:     Mental Status: He is alert and oriented to person, place, and time.     Cranial Nerves: No cranial nerve deficit.     Motor: No abnormal muscle tone.     Coordination: Coordination normal.     Deep Tendon Reflexes: Reflexes are normal and symmetric.  Psychiatric:        Behavior: Behavior normal.        Thought Content: Thought content normal.        Judgment: Judgment normal.           Assessment & Plan:    Essential hypertension  Controlled type 2 diabetes mellitus without complication, without long-term current use of insulin (HCC)  Coronary artery disease involving native coronary  artery of native heart without angina pectoris  Stage 3b chronic kidney disease ([place])  Diabetic nephropathy with proteinuria (HCC)  Chronic kidney disease has improved.  Creatinine is down to 1.5-1.6.  However he still has significant proteinuria.  He is already on maximum dose lisinopril.  We discussed trying [place] but based on his poor reaction to [place] in  the past, the patient prefers not to do this.  His A1c is acceptable at 6.8 however I feel that we can do a better job with his blood pressure so will increase doxazosin from 4 mg a day to 8 mg a day and asked the patient to give me an update on his blood pressure in 2 weeks.  Cholesterol is well controlled.  The remainder of his lab work is stable.

## 2020-05-25 ENCOUNTER — Ambulatory Visit: Payer: Medicare HMO | Admitting: Neurology

## 2020-05-25 ENCOUNTER — Encounter: Payer: Self-pay | Admitting: Neurology

## 2020-05-25 VITALS — BP 162/68 | HR 73 | Ht 70.0 in | Wt 250.0 lb

## 2020-05-25 DIAGNOSIS — R946 Abnormal results of thyroid function studies: Secondary | ICD-10-CM | POA: Diagnosis not present

## 2020-05-25 DIAGNOSIS — R799 Abnormal finding of blood chemistry, unspecified: Secondary | ICD-10-CM | POA: Diagnosis not present

## 2020-05-25 DIAGNOSIS — E519 Thiamine deficiency, unspecified: Secondary | ICD-10-CM | POA: Diagnosis not present

## 2020-05-25 DIAGNOSIS — D519 Vitamin B12 deficiency anemia, unspecified: Secondary | ICD-10-CM | POA: Diagnosis not present

## 2020-05-25 DIAGNOSIS — E349 Endocrine disorder, unspecified: Secondary | ICD-10-CM | POA: Diagnosis not present

## 2020-05-25 DIAGNOSIS — G2581 Restless legs syndrome: Secondary | ICD-10-CM

## 2020-05-25 DIAGNOSIS — N1832 Chronic kidney disease, stage 3b: Secondary | ICD-10-CM | POA: Diagnosis not present

## 2020-05-25 DIAGNOSIS — G63 Polyneuropathy in diseases classified elsewhere: Secondary | ICD-10-CM | POA: Diagnosis not present

## 2020-05-25 DIAGNOSIS — Z8612 Personal history of poliomyelitis: Secondary | ICD-10-CM | POA: Insufficient documentation

## 2020-05-25 DIAGNOSIS — E13311 Other specified diabetes mellitus with unspecified diabetic retinopathy with macular edema: Secondary | ICD-10-CM

## 2020-05-25 MED ORDER — ROPINIROLE HCL 0.5 MG PO TABS
0.5000 mg | ORAL_TABLET | Freq: Every day | ORAL | 3 refills | Status: DC
Start: 2020-05-25 — End: 2022-08-03

## 2020-05-25 NOTE — Addendum Note (Signed)
Addended by: Larey Seat on: 05/25/2020 02:47 PM   Modules accepted: Orders

## 2020-05-25 NOTE — Progress Notes (Signed)
SLEEP MEDICINE CLINIC    Provider:  Larey Seat, MD  Primary Care Physician:  Susy Frizzle, MD [address] [facility] [place] [address]     Referring Provider: Susy Frizzle, [address] [place],  [place] [address]          Chief Complaint according to patient   Patient presents with:    . New Patient (Initial Visit)      Dr Deloria Lair, MD       HISTORY OF PRESENT ILLNESS:  Juan [name] is a 80 year old Caucasian male patient and seen here upon a new problem, referral  on 05/25/2020. This time for neuropathy, the patient has an established history of DM with complication- retinopathy, neuropathy and renal disease 3 b, also HTN , OSA , now treated wit CPAP at 7 cm water.   Chief concern according to patient : "  I am here for neuropathy and pain- may need NCV EMG"   No referral papers seen . Juan [name] has an established history of diabetes mellitus also he has not been insulin-dependent.  He has see comorbidities and associated morbidities of retinopathy, nephropathy, neuropathy.  He is here because he has experienced increasingly painful sensations in his feet so far not affecting his hands.  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  That kind of pain however is usually associated at the lateral leg and just crosses the knee in length.  His peripheral neuropathy is more of a classic description of burning tingling and pins and needle sensations. He denies romberg symptoms, he feels tired a lot more.  He quit using CPAP, after a couple of weeks.  He couldn't tolerate the interface. He was not given another mask, he didn't ask for a new fitting.     Sleep study result last July 2021  DIAGNOSIS:  1. Obstructive Sleep Apnea responded well to only 7 cm water PAP  pressure  2. Sleep Related Hypoxemia resolved.  3. Periodic Limb  Movements were controlled under 7 cm water.   PLANS/RECOMMENDATIONS: Start autotitration CPAP with a setting  from 5-15 cm water, 3 cm EPR and heated humidification. The  patient was fitted with a Respironics DreamWear FFM medium mask    Originally seen for a sleep consult for retinal congestion. pt with wife, rm 75. pt presents today because Dr [name], MD felt like he needed to assess if OSA was a concern. states never had a SS. snores but unsure of apnea events. eye specialist was pushing for this because he had some swelling that was building up behind the eyes after cataract surgery, cc : Dr Bronson Ing.    I have the pleasure of seeing Juan [name] 07/03/2021, a right handed [place] male with a possible sleep disorder.  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  Macular degeneration.     He reports not having neuropathic symptoms while up and runnig but at rest he is bothered.  Pain and burning, he is kicking, massaging his leg.  When moving he feels the pain much less, but as soon as he reclines its back again.  He wakes up from this pain and he reports prolonged sleep latency.    Sleep habits are as follows: The patient's dinner time is between 5 PM. He snacks before he goes to bed, at 8 with coffee(!) and cake.   The patient goes to bed at midnight and no trouble to fall asleep, he continues to sleep for 2 hours, wakes for *several bathroom breaks, the first time at 2 AM.   The preferred sleep position is on either  side, left preferred., with the support of 1 pillows.  Dreams are reportedly rare.  6.30  AM is the usual rise time. The patient wakes up spontaneously.  He reports not feeling refreshed or restored in AM, with symptoms such as dry mouth but no  morning headaches, just  residual fatigue.  Naps are taken frequently, " I snooze"  lasting from 15-40 minutes and are more refreshing than nocturnal sleep.    Review of Systems: Out of a complete 14 system review, the patient complains of only the following symptoms, and all other reviewed systems are negative.:  Fatigue, sleepiness , snoring, fragmented sleep- nocturia.   RLS, neuropathy pain.  Blurred vision.  He is fully vaccinated - COVID 19   How likely are you to doze in the following situations: 0 = not likely, 1 = slight chance, 2 = moderate chance, 3 = high chance   Sitting and Reading? Watching Television? Sitting inactive in a public place (theater or meeting)? As a passenger in a car for an hour without a break? Lying down in the afternoon when circumstances permit? Sitting and talking to someone? Sitting quietly after lunch without alcohol? In a car, while stopped for a few minutes in traffic?   Total = 4/ 24 points - with daily naps (!) rather 10 points.   FSS endorsed at 63/ 63 points.    The pandemic has affected his social live. Isolated   Social History   Socioeconomic History  . Marital status: Married    Spouse name: Not on file  . Number of children: Not on file  . Years of education: Not on file  . Highest education level: Not on file  Occupational History  . Occupation: retired    Fish farm manager: RETIRED    Comment: [employer]   Tobacco Use  . Smoking status: Former Smoker    Years: 35.00    Types: Cigarettes    Quit date: 01/02/1988    Years since quitting: 32.4  . Smokeless tobacco: Never Used  Vaping Use  . Vaping Use: Never used  Substance and Sexual Activity  . Alcohol use: Not Currently    Comment:  rare beer, no history of ETOH abuse  . Drug use: No  . Sexual activity: Not on file  Other Topics Concern  . Not on file  Social History Narrative  . Not on file   Social Determinants of Health   Financial Resource Strain: Not on file  Food Insecurity: Not on file  Transportation Needs: Not on file  Physical Activity: Not on file  Stress: Not on file  Social Connections: Not on file    Family History  Problem Relation Age of Onset  . Diabetes Mother   . Heart disease Mother        After age 41  . Heart attack Mother   .  Hypertension Mother   . Diabetes Father   . Heart disease Father        After age 47  . Hypertension Father   . Heart attack Father   . Brain cancer Sister   . Cancer Sister        Brain  . Diabetes Sister   . Hypertension Sister   . Cancer Brother        Chest Tumor  . Diabetes Brother   . Hypertension Brother   . Deep vein thrombosis Sister   . Diabetes Sister   . Diabetes Brother        Bilateral leg  . Colon cancer Neg Hx     Past Medical History:  Diagnosis Date  . AAA (abdominal aortic aneurysm) ([facility]) 8/12   3.5cm  . CAD (coronary artery disease)   . CKD (chronic kidney disease) stage 3, GFR 30-59 ml/min ([facility])   . Colon polyps   . Diabetes mellitus without complication ([facility])   . 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 Hyperlipidemia   . Hypertension   . 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 Neuromuscular disorder ([facility])    L2-3,L5-S1 bulging disc /nerve impingement  . PAD (peripheral artery disease) ([place])     Past Surgical History:  Procedure Laterality Date  . COLONOSCOPY N/A 09/27/2012   Procedure: COLONOSCOPY;  Surgeon: Daneil Dolin, MD;  Location: [facility];  Service: Endoscopy;  Laterality: N/A;  9:30  . CORONARY ANGIOPLASTY     1989  . TONSILLECTOMY       Current Outpatient Medications on File Prior to Visit  Medication Sig Dispense Refill  .  allopurinol (ZYLOPRIM) 300 MG tablet TAKE 1 TABLET EVERY DAY 90 tablet 3  . aspirin EC 81 MG tablet Take 81 mg by mouth daily.    . Blood Glucose Monitoring Suppl (TRUE METRIX METER) w/Device KIT Use as Directed 1 kit 1  . diltiazem (CARDIZEM CD) 240 MG 24 hr capsule TAKE 1 CAPSULE EVERY DAY 90 capsule 1  . doxazosin (CARDURA) 8 MG tablet Take 1 tablet (8 mg total) by mouth daily. 30 tablet 0  . Dulaglutide (TRULICITY) 1.5 AY/3.0ZS SOPN Inject 1.5 mg into the skin once a week. Begin after 0.81m completed. 4 mL 11  . furosemide (LASIX) 40 MG tablet Take 1 tablet (40 mg total) by mouth daily. 30 tablet 3  . glipiZIDE (GLUCOTROL) 5 MG tablet TAKE 1 TABLET TWO TIMES DAILY BEFORE  MEALS 180 tablet 2  . ketorolac (ACULAR) 0.5 % ophthalmic solution Place 1 drop into the left eye 2 (two) times daily. 5 mL 4  . Lancet Devices (PRODIGY LANCING DEVICE) MISC Use bid DX e11.9 1 each 2  . lisinopril (ZESTRIL) 40 MG tablet TAKE 1 TABLET EVERY DAY 90 tablet 3  . metoprolol succinate (TOPROL-XL) 25 MG 24 hr tablet Take 1 tablet (25 mg total) by mouth daily. 90 tablet 3  . Multiple Vitamin (MULTIVITAMIN WITH MINERALS) TABS Take 1 tablet by mouth daily.    . Omega-3 Fatty Acids (FISH OIL PO) Take by mouth.    . pantoprazole (PROTONIX) 20 MG tablet Take 1 tablet (20 mg total) by mouth daily. 90 tablet 3  . potassium chloride SA (KLOR-CON) 20 MEQ tablet Take 1 tablet (20 mEq total) by mouth daily. 90 tablet 3  . rosuvastatin (CRESTOR) 20 MG tablet TAKE 1 TABLET EVERY DAY 90 tablet 1  .  TRUE METRIX BLOOD GLUCOSE TEST test strip CHECK BLOOD SUGAR EVERY DAY 100 strip 1  . TRUEPLUS LANCETS 33G MISC Check BS QD - dx: e11.9 100 each 3   No current facility-administered medications on file prior to visit.    Allergies  Allergen Reactions  . Jardiance [Empagliflozin]     Severe nausea-     Physical exam:  Today's Vitals   05/25/20 1353  BP: (!) 162/68  Pulse: 73  Weight: 250 lb (113.4 kg)  Height: _0   (1.778 m)   Body mass index is 35.87 kg/m.   Wt Readings from Last 3 Encounters:  05/25/20 250 lb (113.4 kg)  05/18/20 257 lb 9.6 oz (116.8 kg)  05/05/20 253 lb 12.8 oz (115.1 kg)     Ht Readings from Last 3 Encounters:  05/25/20 _1  (1.778 m)  05/18/20 _2  (1.778 m)  05/05/20 _3  (1.778 m)      General: The patient is awake, alert and appears not in acute distress. The patient is well groomed. Head: Normocephalic, atraumatic. Neck is supple. Mallampati 3- only the top of the soft palate is visible.  Appears swollen- GERD ? neck circumference: 18.5 inches . Nasal airflow  patent.  Retrognathia is  seen.  Dental status:  Cardiovascular:  Regular rate and cardiac rhythm by pulse,  without distended neck veins. Respiratory: Lungs are clear to auscultation.  Skin:  Witht evidence of dystrophic skin, hairless and marbled at lower extremity, has ankle edema, petechiae. Trunk: The patient's posture is erect. He is obese , BMI over 35.    Neurologic exam : The patient is awake and alert, oriented to place and time.   Memory subjective described as intact.  Attention span & concentration ability appears normal.  Speech is fluent,  without dysarthria, mild dysphonia , no aphasia.  Mood and affect are appropriate.   Cranial nerves: no loss of smell or taste reported  Pupils are equal and briskly reactive to light. Funduscopic exam deferred.   Extraocular movements in vertical and horizontal planes were intact and without nystagmus. No Diplopia. Visual fields by finger perimetry are intact. Hearing was intact to soft voice and finger rubbing.    Facial sensation intact to fine touch.  Facial motor strength is symmetric and tongue and uvula move midline.  Neck ROM : rotation, tilt and flexion extension were normal for age and shoulder shrug was symmetrical.    Motor exam:  Symmetric bulk, tone and ROM.   Normal tone without cog wheeling, symmetric grip strength .   Sensory:  Proprioception tested in the upper extremities was normal.  Toes are all numb to vibration,  Felt temperature not vibration up to ankle - fine touch is reduced, stereographia is not intact He felt reduced vibration in the ankles(!).     Coordination: Rapid alternating movements in the fingers/hands were of normal speed.  The Finger-to-nose maneuver was intact without evidence of  dysmetria or tremor.  Gait and station: Patient could rise unassisted from a seated position, walked without assistive device.  Stance is of normal width/ base and the patient turned with 3 steps.  Toe and heel walk were deferred.  Deep tendon reflexes: in the upper extremities are symmetric ( DTR level 1 ) and intact. Patella reflex is trace only, achilles DTR is not elicited, equivocal babinski response.      After spending a total time of  30  minutes face to face and additional time for physical and neurologic examination,  review of laboratory studies,  personal review of imaging studies, reports and results of other testing and review of referral information / records as far as provided in visit, I have established the following assessments:  Juan [name] is a 80 year old gentleman with progressed neuropathy of diabetic origin.  He also has  Dystrophic skin, edema and decreased DTR.  CKD 3 b level, retinopathy, nephropathy.     My Plan is to proceed with:  1) NCV and EMG are reuested per primary care : neuropathy and polio history as well as RLS symptoms.  Start requip at low dose prn at night.   2)untreated OSA-  I was surprised to hear that the patient quit CPAP after only 12 days without informing us or the DME of his difficulties.   3) untreated RLS.  Start requip low dose prn at HS   I would like to thank Dr [name], MD , Dr. Lorrine Kin, MD and Susy Frizzle, [address] [place],  [place] [address] for allowing me to meet with and to take care of this pleasant patient.   .  Electronically signed by: Larey Seat, MD 05/25/2020 2:10 PM  [facility] Board certified by The [employer] of Sleep Medicine and Diplomate of the [employer] of Sleep Medicine. Board certified In Neurology through the [place], Fellow of the [employer] of Neurology. Medical Director of [facility].

## 2020-05-25 NOTE — Patient Instructions (Signed)
Neuropathy Ropinirole Oral Tablets What is this medicine? ROPINIROLE (roe PIN i role) is used to treat symptoms of Parkinson's disease. It is also used to treat Restless Legs Syndrome. This medicine may be used for other purposes; ask your health care provider or pharmacist if you have questions. COMMON BRAND NAME(S): Requip What should I tell my health care provider before I take this medicine? They need to know if you have any of these conditions:  heart disease  high blood pressure  kidney disease  liver disease  low blood pressure  narcolepsy  sleep apnea  smoke tobacco cigarettes  an unusual or allergic reaction to ropinirole, other medicines, foods, dyes, or preservatives  pregnant or trying to get pregnant  breast-feeding How should I use this medicine? Take this medicine by mouth with water. Take it as directed on the prescription label. You can take it with or without food. If it upsets your stomach, take it with food. Keep taking this medicine unless your health care provider tells you to stop. Stopping it too quickly can cause serious side effects. It can also make your condition worse. Talk to your health care provider about the use of this medicine in children. Special care may be needed. Overdosage: If you think you have taken too much of this medicine contact a poison control center or emergency room at once. NOTE: This medicine is only for you. Do not share this medicine with others. What if I miss a dose? If you miss a dose, take it as soon as you can. If it is almost time for your next dose, take only that dose. Do not take double or extra doses. What may interact with this medicine?  alcohol  antihistamines for allergy, cough and cold  certain medicines for depression, anxiety, or psychotic disturbances  certain medicines for seizures like phenobarbital, primidone  certain medicines for sleep  ciprofloxacin  male hormones, like estrogens and  birth control pills  fluvoxamine  general anesthetics like halothane, isoflurane, methoxyflurane, propofol  medicines for blood pressure  medicines that relax muscles for surgery  metoclopramide  narcotic medicines for pain  rifampin  tobacco smoking This list may not describe all possible interactions. Give your health care provider a list of all the medicines, herbs, non-prescription drugs, or dietary supplements you use. Also tell them if you smoke, drink alcohol, or use illegal drugs. Some items may interact with your medicine. What should I watch for while using this medicine? Visit your health care provider for regular checks on your progress. Tell your health care provider if your symptoms do not start to get better or if they get worse. Do not suddenly stop taking this medicine. You may develop a severe reaction. Your health care provider will tell you how much medicine to take. If your health care provider wants you to stop the medicine, the dose may be slowly lowered over time to avoid any side effects. You may get drowsy or dizzy. Do not drive, use machinery, or do anything that needs mental alertness until you know how this drug affects you. Do not stand or sit up quickly, especially if you are an older patient. This reduces the risk of dizzy or fainting spells. Alcohol may interfere with the effect of this medicine. Avoid alcoholic drinks. When taking this medicine, you may fall asleep without notice. You may be doing activities like driving a car, talking, or eating. You may not feel drowsy before it happens. Contact your health care provider right  away if this happens to you. There have been reports of increased sexual urges or other strong urges such as gambling while taking this medicine. If you experience any of these while taking this medicine, you should report this to your health care provider as soon as possible. Your mouth may get dry. Chewing sugarless gum or sucking  hard candy and drinking plenty of water may help. Contact your health care provider if the problem does not go away or is severe. What side effects may I notice from receiving this medicine? Side effects that you should report to your doctor or health care professional as soon as possible:  allergic reactions (skin rash, itching or hives; swelling of the face, lips, or tongue)  changes in emotions or moods  changes in vision  confusion  depressed mood  increased blood pressure  falling asleep during normal activities like driving  fast, irregular heartbeat  hallucinations  low blood pressure (dizziness; feeling faint or lightheaded, falls; unusually weak or tired)  new or increased gambling urges, sexual urges, uncontrolled spending, binge or compulsive eating, or other urges  uncontrollable head, mouth, neck, arm, or leg movements Side effects that usually do not require medical attention (report to your doctor or health care professional if they continue or are bothersome):  constipation  dizziness  drowsiness  dry mouth  headache  nausea This list may not describe all possible side effects. Call your doctor for medical advice about side effects. You may report side effects to FDA at 1-800-FDA-1088. Where should I keep my medicine? Keep out of the reach of children and pets. Store at room temperature between 20 and 25 degrees C (68 and 77 degrees F). Protect from light and moisture. Keep the container tightly closed. Get rid of any unused medicine after the expiration date. To get rid of medicines that are no longer needed or have expired:  Take the medicine to a medicine take-back program. Check with your pharmacy or law enforcement to find a location.  If you cannot return the medicine, check the label or package insert to see if the medicine should be thrown out in the garbage or flushed down the toilet. If you are not sure, ask your health care provider. If it is  safe to put it in the trash, take the medicine out of the container. Mix the medicine with cat litter, dirt, coffee grounds, or other unwanted substance. Seal the mixture in a bag or container. Put it in the trash. NOTE: This sheet is a summary. It may not cover all possible information. If you have questions about this medicine, talk to your doctor, pharmacist, or health care provider.  2021 Elsevier/Gold Standard (2019-10-21 20:54:24)  Restless Legs Syndrome Restless legs syndrome is a condition that causes uncomfortable feelings or sensations in the legs, especially while sitting or lying down. The sensations usually cause an overwhelming urge to move the legs. The arms can also sometimes be affected. The condition can range from mild to severe. The symptoms often interfere with a person's ability to sleep. What are the causes? The cause of this condition is not known. What increases the risk? The following factors may make you more likely to develop this condition:  Being older than 50.  Pregnancy.  Being a woman. In general, the condition is more common in women than in men.  A family history of the condition.  Having iron deficiency.  Overuse of caffeine, nicotine, or alcohol.  Certain medical conditions, such as kidney  disease, Parkinson's disease, or nerve damage.  Certain medicines, such as those for high blood pressure, nausea, colds, allergies, depression, and some heart conditions. What are the signs or symptoms? The main symptom of this condition is uncomfortable sensations in the legs, such as:  Pulling.  Tingling.  Prickling.  Throbbing.  Crawling.  Burning. Usually, the sensations:  Affect both sides of the body.  Are worse when you sit or lie down.  Are worse at night. These may wake you up or make it difficult to fall asleep.  Make you have a strong urge to move your legs.  Are temporarily relieved by moving your legs. The arms can also be  affected, but this is rare. People who have this condition often have tiredness during the day because of their lack of sleep at night. How is this diagnosed? This condition may be diagnosed based on:  Your symptoms.  Blood tests. In some cases, you may be monitored in a sleep lab by a specialist (a sleep study). This can detect any disruptions in your sleep. How is this treated? This condition is treated by managing the symptoms. This may include:  Lifestyle changes, such as exercising, using relaxation techniques, and avoiding caffeine, alcohol, or tobacco.  Medicines. Anti-seizure medicines may be tried first. Follow these instructions at home: General instructions  Take over-the-counter and prescription medicines only as told by your health care provider.  Use methods to help relieve the uncomfortable sensations, such as: ? Massaging your legs. ? Walking or stretching. ? Taking a cold or hot bath.  Keep all follow-up visits as told by your health care provider. This is important. Lifestyle  Practice good sleep habits. For example, go to bed and get up at the same time every day. Most adults should get 7-9 hours of sleep each night.  Exercise regularly. Try to get at least 30 minutes of exercise most days of the week.  Practice ways of relaxing, such as yoga or meditation.  Avoid caffeine and alcohol.  Do not use any products that contain nicotine or tobacco, such as cigarettes and e-cigarettes. If you need help quitting, ask your health care provider.      Contact a health care provider if:  Your symptoms get worse or they do not improve with treatment. Summary  Restless legs syndrome is a condition that causes uncomfortable feelings or sensations in the legs, especially while sitting or lying down.  The symptoms often interfere with a person's ability to sleep.  This condition is treated by managing the symptoms. You may need to make lifestyle changes or take  medicines. This information is not intended to replace advice given to you by your health care provider. Make sure you discuss any questions you have with your health care provider. Document Revised: 04/18/2019 Document Reviewed: 03/19/2017 Elsevier Patient Education  2021 [employer].  Neuropathic Pain Neuropathic pain is pain caused by damage to the nerves that are responsible for certain sensations in your body (sensory nerves). The pain can be caused by:  Damage to the sensory nerves that send signals to your spinal cord and brain (peripheral nervous system).  Damage to the sensory nerves in your brain or spinal cord (central nervous system). Neuropathic pain can make you more sensitive to pain. Even a minor sensation can feel very painful. This is usually a long-term condition that can be difficult to treat. The type of pain differs from person to person. It may:  Start suddenly (acute), or it may  develop slowly and last for a long time (chronic).  Come and go as damaged nerves heal, or it may stay at the same level for years.  Cause emotional distress, loss of sleep, and a lower quality of life. What are the causes? The most common cause of this condition is diabetes. Many other diseases and conditions can also cause neuropathic pain. Causes of neuropathic pain can be classified as:  Toxic. This is caused by medicines and chemicals. The most common cause of toxic neuropathic pain is damage from cancer treatments (chemotherapy).  Metabolic. This can be caused by: ? Diabetes. This is the most common disease that damages the nerves. ? Lack of vitamin B from long-term alcohol abuse.  Traumatic. Any injury that cuts, crushes, or stretches a nerve can cause damage and pain. A common example is feeling pain after losing an arm or leg (phantom limb pain).  Compression-related. If a sensory nerve gets trapped or compressed for a long period of time, the blood supply to the nerve can be  cut off.  Vascular. Many blood vessel diseases can cause neuropathic pain by decreasing blood supply and oxygen to nerves.  Autoimmune. This type of pain results from diseases in which the body's defense system (immune system) mistakenly attacks sensory nerves. Examples of autoimmune diseases that can cause neuropathic pain include lupus and multiple sclerosis.  Infectious. Many types of viral infections can damage sensory nerves and cause pain. Shingles infection is a common cause of this type of pain.  Inherited. Neuropathic pain can be a symptom of many diseases that are passed down through families (genetic). What increases the risk? You are more likely to develop this condition if:  You have diabetes.  You smoke.  You drink too much alcohol.  You are taking certain medicines, including medicines that kill cancer cells (chemotherapy) or that treat immune system disorders. What are the signs or symptoms? The main symptom is pain. Neuropathic pain is often described as:  Burning.  Shock-like.  Stinging.  Hot or cold.  Itching. How is this diagnosed? No single test can diagnose neuropathic pain. It is diagnosed based on:  Physical exam and your symptoms. Your health care provider will ask you about your pain. You may be asked to use a pain scale to describe how bad your pain is.  Tests. These may be done to see if you have a high sensitivity to pain and to help find the cause and location of any sensory nerve damage. They include: ? Nerve conduction studies to test how well nerve signals travel through your sensory nerves (electrodiagnostic testing). ? Stimulating your sensory nerves through electrodes on your skin and measuring the response in your spinal cord and brain (somatosensory evoked potential).  Imaging studies, such as: ? X-rays. ? CT scan. ? MRI. How is this treated? Treatment for neuropathic pain may change over time. You may need to try different treatment  options or a combination of treatments. Some options include:  Treating the underlying cause of the neuropathy, such as diabetes, kidney disease, or vitamin deficiencies.  Stopping medicines that can cause neuropathy, such as chemotherapy.  Medicine to relieve pain. Medicines may include: ? Prescription or over-the-counter pain medicine. ? Anti-seizure medicine. ? Antidepressant medicines. ? Pain-relieving patches that are applied to painful areas of skin. ? A medicine to numb the area (local anesthetic), which can be injected as a nerve block.  Transcutaneous nerve stimulation. This uses electrical currents to block painful nerve signals. The treatment is  painless.  Alternative treatments, such as: ? Acupuncture. ? Meditation. ? Massage. ? Physical therapy. ? Pain management programs. ? Counseling. Follow these instructions at home: Medicines  Take over-the-counter and prescription medicines only as told by your health care provider.  Do not drive or use heavy machinery while taking prescription pain medicine.  If you are taking prescription pain medicine, take actions to prevent or treat constipation. Your health care provider may recommend that you: ? Drink enough fluid to keep your urine pale yellow. ? Eat foods that are high in fiber, such as fresh fruits and vegetables, whole grains, and beans. ? Limit foods that are high in fat and processed sugars, such as fried or sweet foods. ? Take an over-the-counter or prescription medicine for constipation.   Lifestyle  Have a good support system at home.  Consider joining a chronic pain support group.  Do not use any products that contain nicotine or tobacco, such as cigarettes and e-cigarettes. If you need help quitting, ask your health care provider.  Do not drink alcohol.   General instructions  Learn as much as you can about your condition.  Work closely with all your health care providers to find the treatment plan  that works best for you.  Ask your health care provider what activities are safe for you.  Keep all follow-up visits as told by your health care provider. This is important. Contact a health care provider if:  Your pain treatments are not working.  You are having side effects from your medicines.  You are struggling with tiredness (fatigue), mood changes, depression, or anxiety. Summary  Neuropathic pain is pain caused by damage to the nerves that are responsible for certain sensations in your body (sensory nerves).  Neuropathic pain may come and go as damaged nerves heal, or it may stay at the same level for years.  Neuropathic pain is usually a long-term condition that can be difficult to treat. Consider joining a chronic pain support group. This information is not intended to replace advice given to you by your health care provider. Make sure you discuss any questions you have with your health care provider. Document Revised: 06/20/2018 Document Reviewed: 03/16/2017 Elsevier Patient Education  2021 [employer].

## 2020-05-26 ENCOUNTER — Telehealth: Payer: Self-pay | Admitting: Neurology

## 2020-05-26 NOTE — Telephone Encounter (Signed)
Called the patient and reviewed the lab results.  Advised the patient there are a few labs still pending.  Informed the patient if they are abnormal I will give him a call.  Patient verbalized understanding and had no further questions.

## 2020-05-26 NOTE — Telephone Encounter (Signed)
-----   Message from Larey Seat, MD sent at 05/26/2020  8:49 AM EDT ----- Elevated , but not uncontrolled , Hgb A1c. Vit B 12 in normal range . TSH in normal range

## 2020-05-26 NOTE — Progress Notes (Signed)
Elevated , but not uncontrolled , Hgb A1c. Vit B 12 in normal range . TSH in normal range

## 2020-05-28 NOTE — Progress Notes (Signed)
B 12 level returned in normal range.

## 2020-05-31 ENCOUNTER — Ambulatory Visit (INDEPENDENT_AMBULATORY_CARE_PROVIDER_SITE_OTHER): Payer: Medicare HMO | Admitting: Ophthalmology

## 2020-05-31 ENCOUNTER — Encounter (INDEPENDENT_AMBULATORY_CARE_PROVIDER_SITE_OTHER): Payer: Self-pay | Admitting: Ophthalmology

## 2020-05-31 ENCOUNTER — Other Ambulatory Visit: Payer: Self-pay

## 2020-05-31 DIAGNOSIS — H43823 Vitreomacular adhesion, bilateral: Secondary | ICD-10-CM | POA: Diagnosis not present

## 2020-05-31 DIAGNOSIS — H35352 Cystoid macular degeneration, left eye: Secondary | ICD-10-CM

## 2020-05-31 DIAGNOSIS — G4733 Obstructive sleep apnea (adult) (pediatric): Secondary | ICD-10-CM

## 2020-05-31 DIAGNOSIS — H47012 Ischemic optic neuropathy, left eye: Secondary | ICD-10-CM | POA: Insufficient documentation

## 2020-05-31 MED ORDER — KETOROLAC TROMETHAMINE 0.5 % OP SOLN
1.0000 [drp] | Freq: Two times a day (BID) | OPHTHALMIC | 4 refills | Status: DC
Start: 2020-05-31 — End: 2022-02-08

## 2020-05-31 NOTE — Addendum Note (Signed)
Addended by: Deloria Lair A on: 05/31/2020 04:43 PM   Modules accepted: Orders

## 2020-05-31 NOTE — Assessment & Plan Note (Addendum)
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  I explained the patient that while this is likely a vascular issue that it is made worse and can be developed as a consequence of untreated sleep apnea.  Also has of undiagnosed sleep apnea.  Patient had successful diagnosis of sleep apnea in July 2021 and failed his first 2 weeks of use and return the machine.  While his visual acuity is permanently affected in his left eye I will suggest that he resume the use of CPAP as it delays in prevents nightly hypoxia that means low oxygen and it protects optic nerves as well as the macula as well as the brain which cannot be seen as easily as the eye can be seen.   I encouraged the patient to follow-up with Dr. Brett Fairy for discussions about what other measures can be undertaken to treat his nightly periods of low oxygenation that threaten his ongoing nerve health because once this happens to one eye  it is very likely to happen in the next if measures are not taken to prevent it.

## 2020-05-31 NOTE — Assessment & Plan Note (Signed)
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  Patient has topical NSAIDs and will refill those for ongoing use

## 2020-05-31 NOTE — Progress Notes (Addendum)
05/31/2020     CHIEF COMPLAINT Patient presents for Retina Follow Up (6 Mo F/U OU//Pt c/o cloudy VA OS x 2 weeks. Pt sts cloudiness fluctuates OS. Pt denies changes to VA OD. No flashes or ocular pain OU./A1c: 6.8, 04/2020/LBS: 141 this AM)   HISTORY OF PRESENT ILLNESS: Juan [name] is a 80 y.o. male who presents to the clinic today for:   HPI    Retina Follow Up    Patient presents with  Other.  In left eye.  This started 6 months ago.  Severity is moderate.  Duration of 6 months.  Since onset it is gradually worsening. Additional comments: 6 Mo F/U OU  Pt c/o cloudy VA OS x 2 weeks. Pt sts cloudiness fluctuates OS. Pt denies changes to VA OD. No flashes or ocular pain OU. A1c: 6.8, 04/2020 LBS: 141 this AM          Comments    Review of systems for temporal arteritis. Patient has negative review of systems for temporal arteritis with no recent weight loss good appetite remains no headaches and no scalp tenderness, no jaw claudication        Last edited by Hurman Horn, MD on 05/31/2020  4:33 PM. (History)      Referring physician: Susy Frizzle, MD [address] [place],  [place] [address]  HISTORICAL INFORMATION:   Selected notes from the MEDICAL RECORD NUMBER    Lab Results  Component Value Date   HGBA1C 6.8 (H) 05/25/2020     CURRENT MEDICATIONS: Current Outpatient Medications (Ophthalmic Drugs)  Medication Sig  . ketorolac (ACULAR) 0.5 % ophthalmic solution Place 1 drop into the left eye 2 (two) times daily.   No current facility-administered medications for this visit. (Ophthalmic Drugs)   Current Outpatient Medications (Other)  Medication Sig  . allopurinol (ZYLOPRIM) 300 MG tablet TAKE 1 TABLET EVERY DAY  . aspirin EC 81 MG tablet Take 81 mg by mouth daily.  . Blood Glucose Monitoring Suppl (TRUE METRIX METER) w/Device KIT Use as Directed  . diltiazem (CARDIZEM CD) 240 MG 24 hr capsule TAKE 1 CAPSULE EVERY DAY  . doxazosin (CARDURA) 8  MG tablet Take 1 tablet (8 mg total) by mouth daily.  . Dulaglutide (TRULICITY) 1.5 YY/4.8GN SOPN Inject 1.5 mg into the skin once a week. Begin after 0.20m completed.  . furosemide (LASIX) 40 MG tablet Take 1 tablet (40 mg total) by mouth daily.  .Marland KitchenglipiZIDE (GLUCOTROL) 5 MG tablet TAKE 1 TABLET TWO TIMES DAILY BEFORE  MEALS  . Lancet Devices (PRODIGY LANCING DEVICE) MISC Use bid DX e11.9  . lisinopril (ZESTRIL) 40 MG tablet TAKE 1 TABLET EVERY DAY  . metoprolol succinate (TOPROL-XL) 25 MG 24 hr tablet Take 1 tablet (25 mg total) by mouth daily.  . Multiple Vitamin (MULTIVITAMIN WITH MINERALS) TABS Take 1 tablet by mouth daily.  . Omega-3 Fatty Acids (FISH OIL PO) Take by mouth.  . pantoprazole (PROTONIX) 20 MG tablet Take 1 tablet (20 mg total) by mouth daily.  . potassium chloride SA (KLOR-CON) 20 MEQ tablet Take 1 tablet (20 mEq total) by mouth daily.  .Marland KitchenrOPINIRole (REQUIP) 0.5 MG tablet Take 1 tablet (0.5 mg total) by mouth at bedtime.  . rosuvastatin (CRESTOR) 20 MG tablet TAKE 1 TABLET EVERY DAY  . TRUE METRIX BLOOD GLUCOSE TEST test strip CHECK BLOOD SUGAR EVERY DAY  . TRUEPLUS LANCETS 33G MISC Check BS QD - dx: e11.9   No current  facility-administered medications for this visit. (Other)      REVIEW OF SYSTEMS:    ALLERGIES Allergies  Allergen Reactions  . Jardiance [Empagliflozin]     Severe nausea-     PAST MEDICAL HISTORY Past Medical History:  Diagnosis Date  . AAA (abdominal aortic aneurysm) ([place]) 8/12   3.5cm  . CAD (coronary artery disease)   . CKD (chronic kidney disease) stage 3, GFR 30-59 ml/min (HCC)   . Colon polyps   . Diabetes mellitus without complication ([place])   . 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 Hyperlipidemia   . Hypertension   . 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 Neuromuscular disorder ([place])    L2-3,L5-S1 bulging disc /nerve impingement  . PAD (peripheral artery disease)  ([place])    Past Surgical History:  Procedure Laterality Date  . COLONOSCOPY N/A 09/27/2012   Procedure: COLONOSCOPY;  Surgeon: Daneil Dolin, MD;  Location: [facility];  Service: Endoscopy;  Laterality: N/A;  9:30  . CORONARY ANGIOPLASTY     1989  . TONSILLECTOMY      FAMILY HISTORY Family History  Problem Relation Age of Onset  . Diabetes Mother   . Heart disease Mother        After age 64  . Heart attack Mother   . Hypertension Mother   . Diabetes Father   . Heart disease Father        After age 60  . Hypertension Father   . Heart attack Father   . Brain cancer Sister   . Cancer Sister        Brain  . Diabetes Sister   . Hypertension Sister   . Cancer Brother        Chest Tumor  . Diabetes Brother   . Hypertension Brother   . Deep vein thrombosis Sister   . Diabetes Sister   . Diabetes Brother        Bilateral leg  . Colon cancer Neg Hx     SOCIAL HISTORY Social History   Tobacco Use  . Smoking status: Former Smoker    Years: 35.00    Types: Cigarettes    Quit date: 01/02/1988    Years since quitting: 32.4  . Smokeless tobacco: Never Used  Vaping Use  . Vaping Use: Never used  Substance Use Topics  . Alcohol use: Not Currently    Comment: rare beer, no history of ETOH abuse  . Drug use: No         OPHTHALMIC EXAM:  Base Eye Exam    Visual Acuity (ETDRS)      Right Left   Dist [place] 20/25 +2 20/400   Dist ph [place]  20/200       Tonometry (Tonopen, 2:47 PM)      Right Left   Pressure 16 10       Pupils      Pupils Dark Light Shape React APD   Right PERRL 2 2 Round Minimal None   Left PERRL 2 2 Round Minimal None       Visual Fields (Counting fingers)      Left Right     Full   Restrictions Total inferior temporal deficiency        Extraocular Movement      Right Left    Full Full       Neuro/Psych    Oriented x3: Yes   Mood/Affect:  Normal       Dilation    Both eyes: 1.0% Mydriacyl, 2.5% Phenylephrine @ 2:51 PM         Slit Lamp and Fundus Exam    External Exam      Right Left   External Normal Normal       Slit Lamp Exam      Right Left   Lids/Lashes Normal Normal   Conjunctiva/Sclera White and quiet White and quiet   Cornea Clear Clear   Anterior Chamber Deep and quiet Deep and quiet   Iris Round and reactive Round and reactive   Lens Posterior chamber intraocular lens Posterior chamber intraocular lens   Anterior Vitreous Normal Normal       Fundus Exam      Right Left   Posterior Vitreous Posterior vitreous detachment Normal   Disc Normal 2+ Optic disc edema pink, not pale, Hemorrhage   C/D Ratio 0.05 0   Macula Epiretinal membrane, no topo distortion ,, no clinical CME, 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 Scan locations included subfoveal. Central Foveal Thickness: 300. Progression has been stable. Findings include epiretinal membrane.   Left Eye Quality was good. Scan locations included subfoveal. Central Foveal Thickness: 410. Progression has worsened. Findings include vitreomacular adhesion , cystoid macular edema, retinal drusen .   Notes Minor epiretinal membrane right eye  OS, with new onset. PeriPapillary edema with extension into the macula with Minor cystoid macular edema.  Incidental VMT noted       Color Fundus Photography Optos - OU - Both Eyes       Right Eye Progression has no prior data.   Left Eye Disc findings include edema. Macula : edema.                 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  I explained the patient that while this is likely a vascular issue that it is made worse and can be  developed as a consequence of untreated sleep apnea.  Also has of undiagnosed sleep apnea.  Patient had successful diagnosis of sleep apnea in July 2021 and failed his first 2 weeks of use and return the machine.  While his visual acuity is permanently affected in his left eye I will suggest that he resume the use of CPAP as it delays in prevents nightly hypoxia that means low oxygen and it protects optic nerves as well as the macula as well as the brain which cannot be seen as easily as the eye can be seen.   I encouraged the patient to follow-up with Dr. Brett Fairy for discussions about what other measures can be undertaken to treat his nightly periods of low oxygenation that threaten his ongoing nerve health because once this happens to one eye  it is very likely to happen in the next if measures are not taken to prevent it.  Cystoid macular edema of left eye Recurrent CME OS most likely on the basis of anterior ischemic optic neuropathy of the left eye.  Patient has topical NSAIDs and will refill those for ongoing use  ICD-10-CM   1. 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 Cystoid macular edema of left eye  H35.352 OCT, Retina - OU - Both Eyes    Color Fundus Photography Optos - OU - Both Eyes  3. Vitreomacular adhesion of both eyes  H43.823 Color Fundus Photography Optos - OU - Both Eyes  4. OSA (obstructive sleep apnea)  G47.33     1.  2.  3.  Ophthalmic Meds Ordered this visit:  Meds ordered this encounter  Medications  . ketorolac (ACULAR) 0.5 % ophthalmic solution    Sig: Place 1 drop into the left eye 2 (two) times daily.    Dispense:  5 mL    Refill:  4       Return in about 1 week (around 06/07/2020) for DILATE OU, COLOR FP, OCT.  There are no Patient Instructions on file for this visit.   Explained the diagnoses, plan, and follow up with the patient and they expressed  understanding.  Patient expressed understanding of the importance of proper follow up care.   Clent Demark Fabrizio Filip M.D. Diseases & Surgery of the Retina and Vitreous Retina & Diabetic [place] 05/31/20     Abbreviations: M myopia (nearsighted); A astigmatism; H hyperopia (farsighted); P presbyopia; Mrx spectacle prescription;  CTL contact lenses; OD right eye; OS left eye; OU both eyes  XT exotropia; ET esotropia; PEK punctate epithelial keratitis; PEE punctate epithelial erosions; DES dry eye syndrome; MGD meibomian gland dysfunction; ATs artificial tears; PFAT's preservative free artificial tears; [place] nuclear sclerotic cataract; PSC posterior subcapsular cataract; ERM epi-retinal membrane; PVD posterior vitreous detachment; RD retinal detachment; DM diabetes mellitus; DR diabetic retinopathy; NPDR non-proliferative diabetic retinopathy; PDR proliferative diabetic retinopathy; CSME clinically significant macular edema; DME diabetic macular edema; dbh dot blot hemorrhages; CWS cotton wool spot; POAG primary open angle glaucoma; C/D cup-to-disc ratio; HVF humphrey visual field; GVF goldmann visual field; OCT optical coherence tomography; IOP intraocular pressure; BRVO Branch retinal vein occlusion; CRVO central retinal vein occlusion; CRAO central retinal artery occlusion; BRAO branch retinal artery occlusion; RT retinal tear; SB scleral buckle; PPV pars plana vitrectomy; VH Vitreous hemorrhage; PRP panretinal laser photocoagulation; IVK intravitreal kenalog; VMT vitreomacular traction; MH Macular hole;  NVD neovascularization of the disc; NVE neovascularization elsewhere; AREDS age related eye disease study; ARMD age related macular degeneration; POAG primary open angle glaucoma; EBMD epithelial/anterior basement membrane dystrophy; ACIOL anterior chamber intraocular lens; IOL intraocular lens; PCIOL posterior chamber intraocular lens; Phaco/IOL phacoemulsification with intraocular lens placement; [place]  photorefractive keratectomy; LASIK laser assisted in situ keratomileusis; HTN hypertension; DM diabetes mellitus; COPD chronic obstructive pulmonary disease

## 2020-06-01 ENCOUNTER — Encounter: Payer: Self-pay | Admitting: Family Medicine

## 2020-06-01 DIAGNOSIS — H47012 Ischemic optic neuropathy, left eye: Secondary | ICD-10-CM | POA: Diagnosis not present

## 2020-06-02 ENCOUNTER — Encounter (INDEPENDENT_AMBULATORY_CARE_PROVIDER_SITE_OTHER): Payer: Self-pay | Admitting: Ophthalmology

## 2020-06-02 ENCOUNTER — Other Ambulatory Visit: Payer: Self-pay

## 2020-06-02 ENCOUNTER — Ambulatory Visit (INDEPENDENT_AMBULATORY_CARE_PROVIDER_SITE_OTHER): Payer: Medicare HMO | Admitting: Ophthalmology

## 2020-06-02 DIAGNOSIS — H35352 Cystoid macular degeneration, left eye: Secondary | ICD-10-CM | POA: Diagnosis not present

## 2020-06-02 DIAGNOSIS — H47012 Ischemic optic neuropathy, left eye: Secondary | ICD-10-CM | POA: Diagnosis not present

## 2020-06-02 LAB — CBC WITH DIFFERENTIAL
Basophils Absolute: 0 10*3/uL (ref 0.0–0.2)
Basos: 0 %
EOS (ABSOLUTE): 0.1 10*3/uL (ref 0.0–0.4)
Eos: 2 %
Hematocrit: 34.5 % — ABNORMAL LOW (ref 37.5–51.0)
Hemoglobin: 11.6 g/dL — ABNORMAL LOW (ref 13.0–17.7)
Immature Grans (Abs): 0 10*3/uL (ref 0.0–0.1)
Immature Granulocytes: 0 %
Lymphocytes Absolute: 1.5 10*3/uL (ref 0.7–3.1)
Lymphs: 23 %
MCH: 31.6 pg (ref 26.6–33.0)
MCHC: 33.6 g/dL (ref 31.5–35.7)
MCV: 94 fL (ref 79–97)
Monocytes Absolute: 0.4 10*3/uL (ref 0.1–0.9)
Monocytes: 7 %
Neutrophils Absolute: 4.2 10*3/uL (ref 1.4–7.0)
Neutrophils: 68 %
RBC: 3.67 x10E6/uL — ABNORMAL LOW (ref 4.14–5.80)
RDW: 13.2 % (ref 11.6–15.4)
WBC: 6.2 10*3/uL (ref 3.4–10.8)

## 2020-06-02 LAB — C-REACTIVE PROTEIN: CRP: 2 mg/L (ref 0–10)

## 2020-06-02 LAB — SEDIMENTATION RATE: Sed Rate: 12 mm/hr (ref 0–30)

## 2020-06-02 NOTE — Assessment & Plan Note (Addendum)
Normal lab test, normal CRP, ESR and CBC.  Have asked patient to continue on low-dose aspirin.  I have explained that that besides this intervention there is no known therapy to reverse the optic nerve damage.  I did also remind the patient that use of nightly oxygen supplementation particularly CPAP for someone with moderate sleep apnea will prevent nightly hypoxic damage to the brain but particularly to the nerve in this type of the situation potentially prevent this developing in the right I

## 2020-06-02 NOTE — Progress Notes (Signed)
06/02/2020     CHIEF COMPLAINT Patient presents for Retina Follow Up (2 Day f\u OS. OCT and FP/Pt states no changes in vision since recent exam, 2 days ago. Pt using gtts as directed.)   HISTORY OF PRESENT ILLNESS: Juan [name] is a 80 y.o. male who presents to the clinic today for:   HPI    Retina Follow Up    Diagnosis: Anterior Ischemic Optic Neuropathy.  In left eye.  Severity is severe.  Duration of 2 days.  Since onset it is stable.  I, the attending physician,  performed the HPI with the patient and updated documentation appropriately. Additional comments: 2 Day f\u OS. OCT and FP Pt states no changes in vision since recent exam, 2 days ago. Pt using gtts as directed.       Last edited by Tilda Franco on 06/02/2020  8:44 AM. (History)      Referring physician: Susy Frizzle, MD [address] [place],  [place] [address]  HISTORICAL INFORMATION:   Selected notes from the MEDICAL RECORD NUMBER    Lab Results  Component Value Date   HGBA1C 6.8 (H) 05/25/2020     CURRENT MEDICATIONS: Current Outpatient Medications (Ophthalmic Drugs)  Medication Sig  . ketorolac (ACULAR) 0.5 % ophthalmic solution Place 1 drop into the left eye 2 (two) times daily.   No current facility-administered medications for this visit. (Ophthalmic Drugs)   Current Outpatient Medications (Other)  Medication Sig  . allopurinol (ZYLOPRIM) 300 MG tablet TAKE 1 TABLET EVERY DAY  . aspirin EC 81 MG tablet Take 81 mg by mouth daily.  . Blood Glucose Monitoring Suppl (TRUE METRIX METER) w/Device KIT Use as Directed  . diltiazem (CARDIZEM CD) 240 MG 24 hr capsule TAKE 1 CAPSULE EVERY DAY  . doxazosin (CARDURA) 8 MG tablet Take 1 tablet (8 mg total) by mouth daily.  . Dulaglutide (TRULICITY) 1.5 TD/1.7OH SOPN Inject 1.5 mg into the skin once a week. Begin after 0.53m completed.  . furosemide (LASIX) 40 MG tablet Take 1 tablet (40 mg total) by mouth daily.  .Marland KitchenglipiZIDE (GLUCOTROL) 5  MG tablet TAKE 1 TABLET TWO TIMES DAILY BEFORE  MEALS  . Lancet Devices (PRODIGY LANCING DEVICE) MISC Use bid DX e11.9  . lisinopril (ZESTRIL) 40 MG tablet TAKE 1 TABLET EVERY DAY  . metoprolol succinate (TOPROL-XL) 25 MG 24 hr tablet Take 1 tablet (25 mg total) by mouth daily.  . Multiple Vitamin (MULTIVITAMIN WITH MINERALS) TABS Take 1 tablet by mouth daily.  . Omega-3 Fatty Acids (FISH OIL PO) Take by mouth.  . pantoprazole (PROTONIX) 20 MG tablet Take 1 tablet (20 mg total) by mouth daily.  . potassium chloride SA (KLOR-CON) 20 MEQ tablet Take 1 tablet (20 mEq total) by mouth daily.  .Marland KitchenrOPINIRole (REQUIP) 0.5 MG tablet Take 1 tablet (0.5 mg total) by mouth at bedtime.  . rosuvastatin (CRESTOR) 20 MG tablet TAKE 1 TABLET EVERY DAY  . TRUE METRIX BLOOD GLUCOSE TEST test strip CHECK BLOOD SUGAR EVERY DAY  . TRUEPLUS LANCETS 33G MISC Check BS QD - dx: e11.9   No current facility-administered medications for this visit. (Other)      REVIEW OF SYSTEMS:    ALLERGIES Allergies  Allergen Reactions  . Jardiance [Empagliflozin]     Severe nausea-     PAST MEDICAL HISTORY Past Medical History:  Diagnosis Date  . AAA (abdominal aortic aneurysm) ([place] 8/12   3.5cm  . CAD (coronary artery  disease)   . CKD (chronic kidney disease) stage 3, GFR 30-59 ml/min (H[facility])   . Colon polyps   . Diabetes mellitus without complication ([place])   . 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 Hyperlipidemia   . Hypertension   . 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 Neuromuscular disorder ([place])    L2-3,L5-S1 bulging disc /nerve impingement  . PAD (peripheral artery disease) ([place])    Past Surgical History:  Procedure Laterality Date  . COLONOSCOPY N/A 09/27/2012   Procedure: COLONOSCOPY;  Surgeon: Daneil Dolin, MD;  Location: [facility];  Service: Endoscopy;  Laterality: N/A;  9:30  . CORONARY ANGIOPLASTY     1989  . TONSILLECTOMY       FAMILY HISTORY Family History  Problem Relation Age of Onset  . Diabetes Mother   . Heart disease Mother        After age 81  . Heart attack Mother   . Hypertension Mother   . Diabetes Father   . Heart disease Father        After age 70  . Hypertension Father   . Heart attack Father   . Brain cancer Sister   . Cancer Sister        Brain  . Diabetes Sister   . Hypertension Sister   . Cancer Brother        Chest Tumor  . Diabetes Brother   . Hypertension Brother   . Deep vein thrombosis Sister   . Diabetes Sister   . Diabetes Brother        Bilateral leg  . Colon cancer Neg Hx     SOCIAL HISTORY Social History   Tobacco Use  . Smoking status: Former Smoker    Years: 35.00    Types: Cigarettes    Quit date: 01/02/1988    Years since quitting: 32.4  . Smokeless tobacco: Never Used  Vaping Use  . Vaping Use: Never used  Substance Use Topics  . Alcohol use: Not Currently    Comment: rare beer, no history of ETOH abuse  . Drug use: No         OPHTHALMIC EXAM: Base Eye Exam    Visual Acuity (Snellen - Linear)      Right Left   Dist [place] 20/25 -1 20/400   Dist ph [place]  20/200       Tonometry (Tonopen, 8:48 AM)      Right Left   Pressure 8 5       Neuro/Psych    Oriented x3: Yes   Mood/Affect: Normal       Dilation    Both eyes: 1.0% Mydriacyl, 2.5% Phenylephrine @ 8:48 AM        Slit Lamp and Fundus Exam    External Exam      Right Left   External Normal Normal       Slit Lamp Exam      Right Left   Lids/Lashes Normal Normal   Conjunctiva/Sclera White and quiet White and quiet   Cornea Clear Clear   Anterior Chamber Deep and quiet Deep and quiet   Iris Round and reactive Round and reactive   Lens Posterior chamber intraocular lens Posterior chamber intraocular lens   Anterior Vitreous Normal Normal       Fundus Exam      Right Left   Posterior Vitreous Posterior vitreous detachment Normal   Disc  Normal 2+ Optic disc edema pink, not  pale, Hemorrhage   C/D Ratio 0.05 0   Macula Epiretinal membrane, no topo distortion ,, no clinical CME, 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 Scan locations included subfoveal. Central Foveal Thickness: 299. Progression has been stable. Findings include epiretinal membrane.   Left Eye Quality was good. Scan locations included subfoveal. Central Foveal Thickness: 430. Progression has worsened. Findings include vitreomacular adhesion , cystoid macular edema, retinal drusen .   Notes Minor epiretinal membrane right eye  OS, with new onset. PeriPapillary edema with extension into the macula with Minor cystoid macular edema.  Incidental VMT noted  No interval change over the last 2 days.       Color Fundus Photography Optos - OU - Both Eyes       Right Eye Progression has no prior data.   Left Eye Disc findings include edema. Macula : edema.   Notes Anterior ischemic optic neuropathy with pink edema, vision loss.  OS.  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  Have asked patient to continue on low-dose aspirin.  I have explained that that besides this intervention there is no known therapy to reverse the optic nerve damage.  I did also remind the patient that use of nightly oxygen supplementation particularly CPAP for someone with moderate sleep apnea will prevent nightly hypoxic damage to the brain but particularly to the nerve in this type of the situation potentially prevent this developing in the right I      ICD-10-CM   1. Cystoid macular edema of left eye  H35.352 OCT, Retina - OU - Both Eyes    Color Fundus Photography Optos - OU - Both Eyes  2. Anterior ischemic  optic neuropathy of left eye  H47.012 OCT, Retina - OU - Both Eyes    Color Fundus Photography Optos - OU - Both Eyes    1.  2.  3.  Ophthalmic Meds Ordered this visit:  No orders of the defined types were placed in this encounter.      Return in about 2 weeks (around 06/16/2020) for DILATE OU, OCT, COLOR FP.  There are no Patient Instructions on file for this visit.   Explained the diagnoses, plan, and follow up with the patient and they expressed understanding.  Patient expressed understanding of the importance of proper follow up care.   Clent Demark Rankin M.D. Diseases & Surgery of the Retina and Vitreous Retina & Diabetic [place] 06/02/20     Abbreviations: M myopia (nearsighted); A astigmatism; H hyperopia (farsighted); P presbyopia; Mrx spectacle prescription;  CTL contact lenses; OD right eye; OS left eye; OU both eyes  XT exotropia; ET esotropia; PEK punctate epithelial keratitis; PEE punctate epithelial erosions; DES dry eye syndrome; MGD meibomian gland dysfunction; ATs artificial tears; PFAT's preservative free artificial tears; [place] nuclear sclerotic cataract; PSC posterior subcapsular cataract; ERM epi-retinal membrane; PVD posterior vitreous detachment; RD retinal detachment; DM diabetes mellitus; DR diabetic retinopathy; NPDR non-proliferative diabetic retinopathy; PDR proliferative diabetic retinopathy; CSME clinically significant macular edema; DME diabetic  macular edema; dbh dot blot hemorrhages; CWS cotton wool spot; POAG primary open angle glaucoma; C/D cup-to-disc ratio; HVF humphrey visual field; GVF goldmann visual field; OCT optical coherence tomography; IOP intraocular pressure; BRVO Branch retinal vein occlusion; CRVO central retinal vein occlusion; CRAO central retinal artery occlusion; BRAO branch retinal artery occlusion; RT retinal tear; SB scleral buckle; PPV pars plana vitrectomy; VH Vitreous hemorrhage; PRP panretinal laser photocoagulation; IVK  intravitreal kenalog; VMT vitreomacular traction; MH Macular hole;  NVD neovascularization of the disc; NVE neovascularization elsewhere; AREDS age related eye disease study; ARMD age related macular degeneration; POAG primary open angle glaucoma; EBMD epithelial/anterior basement membrane dystrophy; ACIOL anterior chamber intraocular lens; IOL intraocular lens; PCIOL posterior chamber intraocular lens; Phaco/IOL phacoemulsification with intraocular lens placement; [place] photorefractive keratectomy; LASIK laser assisted in situ keratomileusis; HTN hypertension; DM diabetes mellitus; COPD chronic obstructive pulmonary disease

## 2020-06-07 LAB — MULTIPLE MYELOMA PANEL, SERUM
Albumin SerPl Elph-Mcnc: 3.5 g/dL (ref 2.9–4.4)
Albumin/Glob SerPl: 1.3 (ref 0.7–1.7)
Alpha 1: 0.2 g/dL (ref 0.0–0.4)
Alpha2 Glob SerPl Elph-Mcnc: 0.8 g/dL (ref 0.4–1.0)
B-Globulin SerPl Elph-Mcnc: 0.9 g/dL (ref 0.7–1.3)
Gamma Glob SerPl Elph-Mcnc: 0.8 g/dL (ref 0.4–1.8)
Globulin, Total: 2.8 g/dL (ref 2.2–3.9)
IgA/Immunoglobulin A, Serum: 202 mg/dL (ref 61–437)
IgG (Immunoglobin G), Serum: 781 mg/dL (ref 603–1613)
IgM (Immunoglobulin M), Srm: 78 mg/dL (ref 15–143)
Total Protein: 6.3 g/dL (ref 6.0–8.5)

## 2020-06-07 LAB — ANA W/REFLEX: ANA Titer 1: NEGATIVE

## 2020-06-07 LAB — VITAMIN B12: Vitamin B-12: 564 pg/mL (ref 232–1245)

## 2020-06-07 LAB — HEMOGLOBIN A1C
Est. average glucose Bld gHb Est-mCnc: 148 mg/dL
Hgb A1c MFr Bld: 6.8 % — ABNORMAL HIGH (ref 4.8–5.6)

## 2020-06-07 LAB — SJOGREN'S SYNDROME ANTIBODS(SSA + SSB)
ENA SSA (RO) Ab: <0.2 AI (ref 0.0–0.9)
ENA SSB (LA) Ab: <0.2 AI (ref 0.0–0.9)

## 2020-06-07 LAB — VITAMIN B1: Thiamine: 141.3 nmol/L (ref 66.5–200.0)

## 2020-06-07 LAB — SEDIMENTATION RATE: Sed Rate: 8 mm/hr (ref 0–30)

## 2020-06-07 LAB — C-REACTIVE PROTEIN: CRP: 4 mg/L (ref 0–10)

## 2020-06-07 LAB — TSH: TSH: 2.27 u[IU]/mL (ref 0.450–4.500)

## 2020-06-07 NOTE — Progress Notes (Signed)
Chronically elevated HbA1c.

## 2020-06-10 ENCOUNTER — Ambulatory Visit (INDEPENDENT_AMBULATORY_CARE_PROVIDER_SITE_OTHER): Payer: Medicare HMO | Admitting: Diagnostic Neuroimaging

## 2020-06-10 ENCOUNTER — Encounter: Payer: Medicare HMO | Admitting: Diagnostic Neuroimaging

## 2020-06-10 DIAGNOSIS — Z8612 Personal history of poliomyelitis: Secondary | ICD-10-CM

## 2020-06-10 DIAGNOSIS — R200 Anesthesia of skin: Secondary | ICD-10-CM

## 2020-06-10 DIAGNOSIS — N1832 Chronic kidney disease, stage 3b: Secondary | ICD-10-CM

## 2020-06-10 DIAGNOSIS — E13311 Other specified diabetes mellitus with unspecified diabetic retinopathy with macular edema: Secondary | ICD-10-CM

## 2020-06-10 DIAGNOSIS — E349 Endocrine disorder, unspecified: Secondary | ICD-10-CM

## 2020-06-10 DIAGNOSIS — Z0289 Encounter for other administrative examinations: Secondary | ICD-10-CM

## 2020-06-10 NOTE — Progress Notes (Signed)
Bilateral tibial F wave latencies are prolonged. Bilateral sural and superficial peroneal sensory responses cannot be obtained.  NEEDLE ELECTROMYOGRAPHY is normal.  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  INTERPRETING PHYSICIAN:  Penni Bombard, MD

## 2020-06-10 NOTE — Procedures (Signed)
[facility]  NCS (NERVE CONDUCTION STUDY) WITH EMG (ELECTROMYOGRAPHY) REPORT   STUDY DATE: 06/10/20 PATIENT NAME: Juan [name] DOB: 03-28-1940 MRN: [id number]  ORDERING CLINICIAN: Larey Seat, MD   TECHNOLOGIST: Sherre Scarlet ELECTROMYOGRAPHER: Earlean Polka. Aysel Gilchrest, MD  CLINICAL INFORMATION: 80 year old male with bilateral foot pain and numbness.  History of diabetes.  FINDINGS: NERVE CONDUCTION STUDY:  Right peroneal motor response has decreased amplitude and normal conduction velocity.  Left peroneal and right tibial motor responses are normal.  Left tibial motor response has slightly decreased amplitude and normal conduction velocity.  Bilateral sural and superficial peroneal sensory responses cannot be obtained.  Bilateral tibial F wave latencies are prolonged.   NEEDLE ELECTROMYOGRAPHY:  Needle examination of left lower extremity is normal.   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     INTERPRETING PHYSICIAN:  Penni Bombard, MD Certified in Neurology, Neurophysiology and Neuroimaging  [facility] [address] [place], [place] [address] [phone]   [facility]    Nerve / Sites Muscle Latency Ref. Amplitude Ref. Rel Amp Segments Distance Velocity Ref. Area    ms ms mV mV %  cm m/s m/s mVms  R Peroneal - EDB     Ankle EDB 4.3 ?6.5 0.6 ?2.0 100 Ankle - EDB 9   3.5     Fib head EDB 11.2  0.3  43 Fib head - Ankle 30 44 ?44 1.3     Pop fossa EDB 13.5  0.2  92.9 Pop fossa - Fib head 10 44 ?44 1.1         Pop fossa - Ankle      L Peroneal - EDB     Ankle EDB 4.6 ?6.5 2.4 ?2.0 100 Ankle - EDB 9   8.8     Fib head EDB 11.2  2.0  85 Fib head - Ankle 29 44 ?44 7.2     Pop fossa EDB 13.4  1.7  83.5 Pop fossa - Fib head 10 44 ?44 6.5         Pop fossa - Ankle      R Tibial - AH     Ankle AH 3.5 ?5.8 4.9 ?4.0 100 Ankle - AH 9   9.3     Pop fossa AH 13.2  2.2  44.1 Pop  fossa - Ankle 40 41 ?41 5.2  L Tibial - AH     Ankle AH 3.7 ?5.8 3.8 ?4.0 100 Ankle - AH 9   12.2     Pop fossa AH 13.3  2.4  64.3 Pop fossa - Ankle 40 42 ?41 4.4             SNC    Nerve / Sites Rec. Site Peak Lat Ref.  Amp Ref. Segments Distance    ms ms V V  cm  R Sural - Ankle (Calf)     Calf Ankle NR ?4.4 NR ?6 Calf - Ankle 14  L Sural - Ankle (Calf)     Calf Ankle NR ?4.4 NR ?6 Calf - Ankle 14  R Superficial peroneal - Ankle     Lat leg Ankle NR ?4.4 NR ?6 Lat leg - Ankle 14  L Superficial peroneal - Ankle     Lat leg Ankle NR ?4.4 NR ?6 Lat leg - Ankle 14             F  Wave    Nerve F Lat Ref.  ms ms  R Tibial - AH 64.4 ?56.0  L Tibial - AH 65.1 ?56.0         EMG Summary Table    Spontaneous MUAP Recruitment  Muscle IA Fib PSW Fasc Other Amp Dur. Poly Pattern  L. Vastus medialis Normal None None None _______ Normal Normal Normal Normal  L. Tibialis anterior Normal None None None _______ Normal Normal Normal Normal  L. Gastrocnemius (Medial head) Normal None None None _______ Normal Normal Normal Normal

## 2020-06-11 ENCOUNTER — Telehealth: Payer: Self-pay | Admitting: Neurology

## 2020-06-11 NOTE — Telephone Encounter (Signed)
Called the patient and was able to review the nerve conduction study results.  Advised of the findings in detail.  Patient verbalized understanding and had no further questions at this time.  Encouraged to call back if questions were to arise.

## 2020-06-11 NOTE — Telephone Encounter (Signed)
-----   Message from Larey Seat, MD sent at 06/10/2020  4:48 PM EDT ----- Bilateral tibial F wave latencies are prolonged. Bilateral sural and superficial peroneal sensory responses cannot be obtained.  NEEDLE ELECTROMYOGRAPHY is normal.  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  INTERPRETING PHYSICIAN:  Penni Bombard, MD

## 2020-06-16 ENCOUNTER — Encounter (INDEPENDENT_AMBULATORY_CARE_PROVIDER_SITE_OTHER): Payer: Medicare HMO | Admitting: Ophthalmology

## 2020-06-22 NOTE — Progress Notes (Signed)
B 12 in normal range.

## 2020-07-05 ENCOUNTER — Other Ambulatory Visit: Payer: Self-pay | Admitting: Family Medicine

## 2020-07-13 ENCOUNTER — Ambulatory Visit: Payer: Medicare HMO | Admitting: Family Medicine

## 2020-08-02 ENCOUNTER — Encounter: Payer: Self-pay | Admitting: Family Medicine

## 2020-08-02 MED ORDER — DOXAZOSIN MESYLATE 8 MG PO TABS: 8.0000 mg | ORAL_TABLET | Freq: Every day | ORAL | 1 refills | Status: DC

## 2020-08-04 ENCOUNTER — Telehealth: Payer: Self-pay | Admitting: Family Medicine

## 2020-08-04 NOTE — Telephone Encounter (Signed)
Copied from [place] [id number]. Topic: Medicare AWV >> Aug 04, 2020  1:38 PM Cher Nakai R wrote: Reason for CRM:  Left message for patient to call back and schedule Medicare Annual Wellness Visit (AWV) in office.   If not able to come in office, please offer to do virtually or by telephone.   Last AWV:  11/14/2018  Please schedule at anytime with [facility]-Nurse Health Advisor.  If any questions, please contact me at [phone]

## 2020-08-24 ENCOUNTER — Other Ambulatory Visit: Payer: Self-pay | Admitting: Family Medicine

## 2020-08-24 DIAGNOSIS — E119 Type 2 diabetes mellitus without complications: Secondary | ICD-10-CM

## 2020-09-06 ENCOUNTER — Telehealth: Payer: Self-pay | Admitting: *Deleted

## 2020-09-06 NOTE — Chronic Care Management (AMB) (Signed)
  Chronic Care Management   Note  09/06/2020 Name: Juan [name] MRN: [id number] DOB: 03-23-1940  Juan [name] is a 80 y.o. year old male who is a primary care patient of Susy Frizzle, MD. I reached out to [name] by phone today in response to a referral sent by Juan [name] PCP, Dr. Dennard Schaumann.       Juan [name] was given information about Chronic Care Management services today including:  CCM service includes personalized support from designated clinical staff supervised by his physician, including individualized plan of care and coordination with other care providers 24/7 contact phone numbers for assistance for urgent and routine care needs. Service will only be billed when office clinical staff spend 20 minutes or more in a month to coordinate care. Only one practitioner may furnish and bill the service in a calendar month. The patient may stop CCM services at any time (effective at the end of the month) by phone call to the office staff. The patient will be responsible for cost sharing (co-pay) of up to 20% of the service fee (after annual deductible is met).  Patient agreed to services and verbal consent obtained.   Follow up plan: Telephone appointment with care management team member scheduled for:09/17/2020  Edmonia Gonser  Care Guide, Embedded Care Coordination [place]  Care Management

## 2020-09-17 ENCOUNTER — Ambulatory Visit (INDEPENDENT_AMBULATORY_CARE_PROVIDER_SITE_OTHER): Payer: Medicare HMO | Admitting: *Deleted

## 2020-09-17 DIAGNOSIS — E119 Type 2 diabetes mellitus without complications: Secondary | ICD-10-CM | POA: Diagnosis not present

## 2020-09-17 DIAGNOSIS — I10 Essential (primary) hypertension: Secondary | ICD-10-CM | POA: Diagnosis not present

## 2020-09-17 NOTE — Chronic Care Management (AMB) (Signed)
Chronic Care Management   CCM RN Visit Note  09/17/2020 Name: Juan [name] MRN: [id number] DOB: 1940/09/17  Subjective: Juan [name] is a 80 y.o. year old male who is a primary care patient of Pickard, Cammie Mcgee, MD. The care management team was consulted for assistance with disease management and care coordination needs.    Engaged with patient by telephone for initial visit in response to provider referral for case management and/or care coordination services.   Consent to Services:  The patient was given the following information about Chronic Care Management services today, agreed to services, and gave verbal consent: 1. CCM service includes personalized support from designated clinical staff supervised by the primary care provider, including individualized plan of care and coordination with other care providers 2. 24/7 contact phone numbers for assistance for urgent and routine care needs. 3. Service will only be billed when office clinical staff spend 20 minutes or more in a month to coordinate care. 4. Only one practitioner may furnish and bill the service in a calendar month. 5.The patient may stop CCM services at any time (effective at the end of the month) by phone call to the office staff. 6. The patient will be responsible for cost sharing (co-pay) of up to 20% of the service fee (after annual deductible is met). Patient agreed to services and consent obtained.  Patient agreed to services and verbal consent obtained.   Assessment: Review of patient past medical history, allergies, medications, health status, including review of consultants reports, laboratory and other test data, was performed as part of comprehensive evaluation and provision of chronic care management services.   SDOH (Social Determinants of Health) assessments and interventions performed:  SDOH Interventions    Flowsheet Row Most Recent Value  SDOH Interventions   Housing Interventions Intervention Not  Indicated  Transportation Interventions Intervention Not Indicated        CCM Care Plan  Allergies  Allergen Reactions   Jardiance [Empagliflozin]     Severe nausea-     Outpatient Encounter Medications as of 09/17/2020  Medication Sig   allopurinol (ZYLOPRIM) 300 MG tablet TAKE 1 TABLET EVERY DAY   aspirin EC 81 MG tablet Take 81 mg by mouth daily.   Blood Glucose Monitoring Suppl (TRUE METRIX METER) w/Device KIT Use as Directed   diltiazem (CARDIZEM CD) 240 MG 24 hr capsule TAKE 1 CAPSULE EVERY DAY   doxazosin (CARDURA) 8 MG tablet Take 1 tablet (8 mg total) by mouth daily.   Dulaglutide (TRULICITY) 1.5 OE/4.2PN SOPN Inject 1.5 mg into the skin once a week. Begin after 0.62m completed.   furosemide (LASIX) 40 MG tablet Take 1 tablet (40 mg total) by mouth daily.   glipiZIDE (GLUCOTROL) 5 MG tablet TAKE 1 TABLET TWO TIMES DAILY BEFORE  MEALS   ketorolac (ACULAR) 0.5 % ophthalmic solution Place 1 drop into the left eye 2 (two) times daily.   Lancet Devices (PRODIGY LANCING DEVICE) MISC Use bid DX e11.9   lisinopril (ZESTRIL) 40 MG tablet TAKE 1 TABLET EVERY DAY   metoprolol succinate (TOPROL-XL) 25 MG 24 hr tablet TAKE 1 TABLET EVERY DAY   Multiple Vitamin (MULTIVITAMIN WITH MINERALS) TABS Take 1 tablet by mouth daily.   Omega-3 Fatty Acids (FISH OIL PO) Take by mouth.   pantoprazole (PROTONIX) 20 MG tablet Take 1 tablet (20 mg total) by mouth daily.   potassium chloride SA (KLOR-CON) 20 MEQ tablet Take 1 tablet (20 mEq total) by mouth daily.   rOPINIRole (REQUIP) 0.5 MG  tablet Take 1 tablet (0.5 mg total) by mouth at bedtime.   rosuvastatin (CRESTOR) 20 MG tablet TAKE 1 TABLET EVERY DAY   TRUE METRIX BLOOD GLUCOSE TEST test strip CHECK BLOOD SUGAR EVERY DAY   TRUEPLUS LANCETS 33G MISC Check BS QD - dx: e11.9   No facility-administered encounter medications on file as of 09/17/2020.    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   OSA (obstructive sleep apnea) 09/18/2019   Cardiac arrhythmia 08/21/2019   Cystoid macular edema of right eye 07/14/2019   Vitreomacular adhesion of left eye 07/14/2019   Cystoid macular edema of left eye 07/14/2019   Vitreomacular adhesion of both eyes 07/14/2019   Retinopathy due to secondary diabetes mellitus, with macular edema, 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[place]) 07/03/2019   CAD S/P percutaneous coronary angioplasty 07/03/2019   CKD (chronic kidney disease) stage 3, GFR 30-59 ml/min (HCC)    Hypertension 05/09/2017   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    PAD (peripheral artery disease) ([place])    Personal history of colonic polyps 09/09/2012   Diabetes mellitus without complication (HCC)    Hyperlipidemia    Neuromuscular disorder ([place])    CAD (coronary artery disease)    AAA (abdominal aortic aneurysm) ([place]) 01/02/2012    Conditions to be addressed/monitored:HTN and DMII  Care Plan : Diabetes Type 2 (Adult)  Updates made by Kassie Mends, RN since 09/17/2020 12:00 AM     Problem: Glycemic Management (Diabetes, Type 2)   Priority: Medium     Long-Range Goal: Glycemic Management Optimized   Start Date: 09/17/2020  Expected End Date: 03/20/2021  This Visit's Progress: On track  Priority: Medium  Note:   Objective:  Lab Results  Component Value Date   HGBA1C 6.8 (H) 05/25/2020   Lab Results  Component Value Date   CREATININE 1.56 (H) 05/14/2020   CREATININE 1.63 (H) 12/01/2019   CREATININE 2.27 (H) 11/04/2019   No results found for: EGFR Current Barriers:  Knowledge Deficits related to basic Diabetes pathophysiology and self care/management- does not adhere to ADA or any specific diet, pt reports he lives with spouse and has sister that he can  call on if needed.  Pt reports he is independent in all aspects of his care, continues to drive, reports no issues with medications and states he is already getting assistance for one of his medications.  Pt reports he checks CBG once daily with readings 100-130. Case Manager Clinical Goal(s):  patient will demonstrate improved adherence to prescribed treatment plan for diabetes self care/management as evidenced by: daily monitoring and recording of CBG  adherence to ADA/ carb modified diet adherence to prescribed medication regimen contacting provider for new or worsened symptoms or questions Interventions:  Collaboration with Susy Frizzle, MD regarding development and update of comprehensive plan of care as evidenced by provider attestation and co-signature Inter-disciplinary care team collaboration (see longitudinal plan of care) Provided education to patient about basic DM disease process Reviewed medications with patient and discussed importance of medication adherence Discussed plans with patient for ongoing care management follow up and provided patient with direct contact information for care management team Provided patient with written educational materials related to hypo and hyperglycemia and importance of correct  treatment Reviewed scheduled/upcoming provider appointments including: pt to make appointment with primary care provider office for Annual Wellness Visit, verbalizes understanding Review of patient status, including review of consultants reports, relevant laboratory and other test results, and medications completed. Reviewed importance of following ADA diet and being mindful of carbohydrate intake Reviewed importance of exercising daily Self-Care Activities - Attends all scheduled provider appointments Checks blood sugars as prescribed and utilize hyper and hypoglycemia protocol as needed Adheres to prescribed ADA/carb modified Patient Goals: - manage portion size -  check feet daily for cuts, sores or redness - wash and dry feet carefully every day - wear comfortable, cotton socks - wear comfortable, well-fitting shoes - check blood sugar at prescribed times - check blood sugar if I feel it is too high or too low - enter blood sugar readings and medication or insulin into daily log - take the blood sugar log to all doctor visits - take the blood sugar meter to all doctor visits  - be mindful of carbohydrate intake, foods such as pasta, potatoes, bread can elevate blood sugar - avoid/ limit concentrated sweets such as cakes, pies, cookies - please review education sent via My Chart- hypoglycemia Follow Up Plan: Telephone follow up appointment with care management team member scheduled for:   10/29/2020    Care Plan : Hypertension (Adult)  Updates made by Kassie Mends, RN since 09/17/2020 12:00 AM     Problem: Hypertension (Hypertension)   Priority: High     Long-Range Goal: Hypertension Monitored   Start Date: 09/17/2020  Expected End Date: 03/20/2021  This Visit's Progress: On track  Priority: High  Note:   Objective:  Last practice recorded BP readings:  BP Readings from Last 3 Encounters:  05/25/20 (!) 162/68  05/18/20 (!) 162/82  05/05/20 (!) 171/77   Most recent eGFR/CrCl: No results found for: EGFR  No components found for: CRCL Current Barriers:  Knowledge Deficits related to basic understanding of hypertension pathophysiology and self care management- pt does not adhere to any specific diet Does not adhere to provider recommendations re:  does not check BP daily, checks few times per week.  Pt reports he has all medications and takes as prescribed. Case Manager Clinical Goal(s):  patient will verbalize understanding of plan for hypertension management patient will attend all scheduled medical appointments: upcoming annual wellness visit due patient will demonstrate improved adherence to prescribed treatment plan for hypertension as  evidenced by taking all medications as prescribed, monitoring and recording blood pressure as directed, adhering to low sodium/DASH diet Interventions:  Collaboration with Susy Frizzle, MD regarding development and update of comprehensive plan of care as evidenced by provider attestation and co-signature Inter-disciplinary care team collaboration (see longitudinal plan of care) Evaluation of current treatment plan related to hypertension self management and patient's adherence to plan as established by provider. Provided education to patient re: stroke prevention, s/s of heart attack and stroke, DASH diet, complications of uncontrolled blood pressure Reviewed medications with patient and discussed importance of compliance Discussed plans with patient for ongoing care management follow up and provided patient with direct contact information for care management team Advised patient, providing education and rationale, to monitor blood pressure at least 3 times per week and record, calling PCP for findings outside established parameters.  Reviewed scheduled/upcoming provider appointments including: pt to schedule Annual Wellness Visit with primary care provider office, verbalizes understanding Reviewed importance of adherence to low sodium diet Try to exercise daily/ walking is good Education on  low sodium/ DASH diet sent to pt via My Chart Self-Care Activities: Attends all scheduled provider appointments Calls provider office for new concerns, questions, or BP outside discussed parameters Checks BP and records as discussed Follows a low sodium diet/DASH diet Patient Goals: - check blood pressure 3 times per week - write blood pressure results in a log or diary and take to doctor's appointments - follow a low salt diet - look over education on low sodium/ DASH diet sent via My Chart - try to exercise daily Follow Up Plan: Telephone follow up appointment with care management team member  scheduled for:      Plan:Telephone follow up appointment with care management team member scheduled for:  10/29/2020  Jacqlyn Larsen [facility], BSN RN Case Manager [facility] [phone]

## 2020-09-17 NOTE — Patient Instructions (Signed)
Visit Information   PATIENT GOALS:   Goals Addressed             This Visit's Progress    Monitor and Manage My Blood Sugar-Diabetes Type 2       Timeframe:  Long-Range Goal Priority:  Medium Start Date:    09/17/2020                         Expected End Date:    03/20/2021                   Follow Up Date- 10/29/2020   - check blood sugar at prescribed times - check blood sugar if I feel it is too high or too low - enter blood sugar readings and medication or insulin into daily log - take the blood sugar log to all doctor visits - take the blood sugar meter to all doctor visits  - be mindful of carbohydrate intake, foods such as pasta, potatoes, bread can elevate blood sugar - avoid/ limit concentrated sweets such as cakes, pies, cookies - please review education sent via My Chart- hypoglycemia   Why is this important?   Checking your blood sugar at home helps to keep it from getting very high or very low.  Writing the results in a diary or log helps the doctor know how to care for you.  Your blood sugar log should have the time, date and the results.  Also, write down the amount of insulin or other medicine that you take.  Other information, like what you ate, exercise done and how you were feeling, will also be helpful.     Notes:       Track and Manage My Blood Pressure-Hypertension       Timeframe:  Long-Range Goal Priority:  High Start Date:           09/17/2020                  Expected End Date:   03/20/2021                    Follow Up Date- 10/29/2020   - check blood pressure 3 times per week - write blood pressure results in a log or diary and take to doctor's appointments - follow a low salt diet - look over education on low sodium/ DASH diet sent via My Chart - try to exercise daily   Why is this important?   You won't feel high blood pressure, but it can still hurt your blood vessels.  High blood pressure can cause heart or kidney problems. It can also cause a  stroke.  Making lifestyle changes like losing a little weight or eating less salt will help.  Checking your blood pressure at home and at different times of the day can help to control blood pressure.  If the doctor prescribes medicine remember to take it the way the doctor ordered.  Call the office if you cannot afford the medicine or if there are questions about it.     Notes:          Consent to CCM Services: Juan [name] was given information about Chronic Care Management services today including:  CCM service includes personalized support from designated clinical staff supervised by his physician, including individualized plan of care and coordination with other care providers 24/7 contact phone numbers for assistance for urgent and routine care needs. Service will  only be billed when office clinical staff spend 20 minutes or more in a month to coordinate care. Only one practitioner may furnish and bill the service in a calendar month. The patient may stop CCM services at any time (effective at the end of the month) by phone call to the office staff. The patient will be responsible for cost sharing (co-pay) of up to 20% of the service fee (after annual deductible is met).  Patient agreed to services and verbal consent obtained.   Patient verbalizes understanding of instructions provided today and agrees to view in [place].   Telephone follow up appointment with care management team member scheduled for:   8/19/2022Hypoglycemia Hypoglycemia is when the sugar (glucose) level in your blood is too low. Low blood sugar can happen to people who have diabetes and people who do not have diabetes. Low blood sugar can happenquickly, and it can be an emergency. What are the causes? This condition happens most often in people who have diabetes. It may be caused by: Diabetes medicine. Not eating enough, or not eating often enough. Doing more physical activity. Drinking alcohol on an empty  stomach. If you do not have diabetes, this condition may be caused by: A tumor in the pancreas. Not eating enough, or not eating for long periods at a time (fasting). A very bad infection or illness. Problems after having weight loss (bariatric) surgery. Kidney failure or liver failure. Certain medicines. What increases the risk? This condition is more likely to develop in people who: Have diabetes and take medicines to lower their blood sugar. Abuse alcohol. Have a very bad illness. What are the signs or symptoms? Mild Hunger. Sweating and feeling clammy. Feeling dizzy or light-headed. Being sleepy or having trouble sleeping. Feeling like you may vomit (nauseous). A fast heartbeat. A headache. Blurry vision. Mood changes, such as: Being grouchy. Feeling worried or nervous (anxious). Tingling or loss of feeling (numbness) around your mouth, lips, or tongue. Moderate Confusion and poor judgment. Behavior changes. Weakness. Uneven heartbeat. Trouble with moving (coordination). Very low Very low blood sugar (severe hypoglycemia) is a medical emergency. It can cause: Fainting. Seizures. Loss of consciousness (coma). Death. How is this treated? Treating low blood sugar Low blood sugar is often treated by eating or drinking something that has sugar in it right away. The food or drink should contain 15 grams of a fast-acting carb (carbohydrate). Options include: 4 oz (120 mL) of fruit juice. 4 oz (120 mL) of regular soda (not diet soda). A few pieces of hard candy. Check food labels to see how many pieces to eat for 15 grams. 1 Tbsp (15 mL) of sugar or honey. 4 glucose tablets. 1 tube of glucose gel. Treating low blood sugar if you have diabetes If you can think clearly and swallow safely, follow the 15:15 rule: Take 15 grams of a fast-acting carb. Talk with your doctor about how much you should take. Always keep a source of fast-acting carb with you, such as: Glucose  tablets (take 4 tablets). A few pieces of hard candy. Check food labels to see how many pieces to eat for 15 grams. 4 oz (120 mL) of fruit juice. 4 oz (120 mL) of regular soda (not diet soda). 1 Tbsp (15 mL) of honey or sugar. 1 tube of glucose gel. Check your blood sugar 15 minutes after you take the carb. If your blood sugar is still at or below 70 mg/dL (3.9 mmol/L), take 15 grams of a carb again. If  your blood sugar does not go above 70 mg/dL (3.9 mmol/L) after 3 tries, get help right away. After your blood sugar goes back to normal, eat a meal or a snack within 1 hour.  Treating very low blood sugar If your blood sugar is below 54 mg/dL (3 mmol/L), you have very low blood sugar, or severe hypoglycemia. This is an emergency. Get medical help right away. If you have very low blood sugar and you cannot eat or drink, you will need to be given a hormone called glucagon. A family member or friend should learn how to check your blood sugar and how to give you glucagon. Ask your doctor if youneed to have an emergency glucagon kit at home. Very low blood sugar may also need to be treated in a hospital. Follow these instructions at home: General instructions Take over-the-counter and prescription medicines only as told by your doctor. Stay aware of your blood sugar as told by your doctor. If you drink alcohol: Limit how much you have to: 0-1 drink a day for women who are not pregnant. 0-2 drinks a day for men. Know how much alcohol is in your drink. In the [place] one drink equals one 12 oz bottle of beer (355 mL), one 5 oz glass of wine (148 mL), or one 1 oz glass of hard liquor (44 mL). Be sure to eat food when you drink alcohol. Know that your body absorbs alcohol quickly. This may lead to low blood sugar later. Be sure to keep checking your blood sugar. Keep all follow-up visits. If you have diabetes:  Always have a fast-acting carb (15 grams) with you to treat low blood sugar. Follow  your diabetes care plan as told by your doctor. Make sure you: Know the symptoms of low blood sugar. Check your blood sugar as often as told. Always check it before and after exercise. Always check your blood sugar before you drive. Take your medicines as told. Follow your meal plan. Eat on time. Do not skip meals. Share your diabetes care plan with: Your work or school. People you live with. Carry a card or wear jewelry that says you have diabetes.  Where to find more information American Diabetes Association: www.diabetes.org Contact a doctor if: You have trouble keeping your blood sugar in your target range. You have low blood sugar often. Get help right away if: You still have symptoms after you eat or drink something that contains 15 grams of fast-acting carb, and you cannot get your blood sugar above 70 mg/dL by following the 15:15 rule. Your blood sugar is below 54 mg/dL (3 mmol/L). You have a seizure. You faint. These symptoms may be an emergency. Get help right away. Call your local emergency services (911 in the [place] Do not wait to see if the symptoms will go away. Do not drive yourself to the hospital. Summary Hypoglycemia happens when the level of sugar (glucose) in your blood is too low. Low blood sugar can happen to people who have diabetes and people who do not have diabetes. Low blood sugar can happen quickly, and it can be an emergency. Make sure you know the symptoms of low blood sugar and know how to treat it. Always keep a source of sugar (fast-acting carb) with you to treat low blood sugar. This information is not intended to replace advice given to you by your health care provider. Make sure you discuss any questions you have with your healthcare provider. Document Revised: 01/29/2020 Document Reviewed:  01/29/2020 Elsevier Patient Education  2022 [facility]. [email or web address]">  DASH Eating Plan DASH stands  for Dietary Approaches to Stop Hypertension. The DASH eating plan is a healthy eating plan that has been shown to: Reduce high blood pressure (hypertension). Reduce your risk for type 2 diabetes, heart disease, and stroke. Help with weight loss. What are tips for following this plan? Reading food labels Check food labels for the amount of salt (sodium) per serving. Choose foods with less than 5 percent of the Daily Value of sodium. Generally, foods with less than 300 milligrams (mg) of sodium per serving fit into this eating plan. To find whole grains, look for the word "whole" as the first word in the ingredient list. Shopping Buy products labeled as "low-sodium" or "no salt added." Buy fresh foods. Avoid canned foods and pre-made or frozen meals. Cooking Avoid adding salt when cooking. Use salt-free seasonings or herbs instead of table salt or sea salt. Check with your health care provider or pharmacist before using salt substitutes. Do not fry foods. Cook foods using healthy methods such as baking, boiling, grilling, roasting, and broiling instead. Cook with heart-healthy oils, such as olive, canola, avocado, soybean, or sunflower oil. Meal planning  Eat a balanced diet that includes: 4 or more servings of fruits and 4 or more servings of vegetables each day. Try to fill one-half of your plate with fruits and vegetables. 6-8 servings of whole grains each day. Less than 6 oz (170 g) of lean meat, poultry, or fish each day. A 3-oz (85-g) serving of meat is about the same size as a deck of cards. One egg equals 1 oz (28 g). 2-3 servings of low-fat dairy each day. One serving is 1 cup (237 mL). 1 serving of nuts, seeds, or beans 5 times each week. 2-3 servings of heart-healthy fats. Healthy fats called omega-3 fatty acids are found in foods such as walnuts, flaxseeds, fortified milks, and eggs. These fats are also found in cold-water fish, such as sardines, salmon, and mackerel. Limit how much  you eat of: Canned or prepackaged foods. Food that is high in trans fat, such as some fried foods. Food that is high in saturated fat, such as fatty meat. Desserts and other sweets, sugary drinks, and other foods with added sugar. Full-fat dairy products. Do not salt foods before eating. Do not eat more than 4 egg yolks a week. Try to eat at least 2 vegetarian meals a week. Eat more home-cooked food and less restaurant, buffet, and fast food.  Lifestyle When eating at a restaurant, ask that your food be prepared with less salt or no salt, if possible. If you drink alcohol: Limit how much you use to: 0-1 drink a day for women who are not pregnant. 0-2 drinks a day for men. Be aware of how much alcohol is in your drink. In the [place] one drink equals one 12 oz bottle of beer (355 mL), one 5 oz glass of wine (148 mL), or one 1 oz glass of hard liquor (44 mL). General information Avoid eating more than 2,300 mg of salt a day. If you have hypertension, you may need to reduce your sodium intake to 1,500 mg a day. Work with your health care provider to maintain a healthy body weight or to lose weight. Ask what an ideal weight is for you. Get at least 30 minutes of exercise that causes your heart to beat faster (aerobic exercise) most days of the week. Activities may include walking,  swimming, or biking. Work with your health care provider or dietitian to adjust your eating plan to your individual calorie needs. What foods should I eat? Fruits All fresh, dried, or frozen fruit. Canned fruit in natural juice (without addedsugar). Vegetables Fresh or frozen vegetables (raw, steamed, roasted, or grilled). Low-sodium or reduced-sodium tomato and vegetable juice. Low-sodium or reduced-sodium tomatosauce and tomato paste. Low-sodium or reduced-sodium canned vegetables. Grains Whole-grain or whole-wheat bread. Whole-grain or whole-wheat pasta. Brown rice. [place]. Bulgur. Whole-grain and  low-sodium cereals. Pita bread.Low-fat, low-sodium crackers. Whole-wheat flour tortillas. Meats and other proteins Skinless chicken or [place]. Ground chicken or [place]. Pork with fat trimmed off. Fish and seafood. Egg whites. Dried beans, peas, or lentils. Unsalted nuts, nut butters, and seeds. Unsalted canned beans. Lean cuts of beef with fat trimmed off. Low-sodium, lean precooked or cured meat, such as sausages or meatloaves. Dairy Low-fat (1%) or fat-free (skim) milk. Reduced-fat, low-fat, or fat-free cheeses. Nonfat, low-sodium ricotta or cottage cheese. Low-fat or nonfatyogurt. Low-fat, low-sodium cheese. Fats and oils Soft margarine without trans fats. Vegetable oil. Reduced-fat, low-fat, or light mayonnaise and salad dressings (reduced-sodium). Canola, safflower, olive, avocado, soybean, andsunflower oils. Avocado. Seasonings and condiments Herbs. Spices. Seasoning mixes without salt. Other foods Unsalted popcorn and pretzels. Fat-free sweets. The items listed above may not be a complete list of foods and beverages you can eat. Contact a dietitian for more information. What foods should I avoid? Fruits Canned fruit in a light or heavy syrup. Fried fruit. Fruit in cream or buttersauce. Vegetables Creamed or fried vegetables. Vegetables in a cheese sauce. Regular canned vegetables (not low-sodium or reduced-sodium). Regular canned tomato sauce and paste (not low-sodium or reduced-sodium). Regular tomato and vegetable juice(not low-sodium or reduced-sodium). Angie Fava. Olives. Grains Baked goods made with fat, such as croissants, muffins, or some breads. Drypasta or rice meal packs. Meats and other proteins Fatty cuts of meat. Ribs. Fried meat. Berniece Salines. Bologna, salami, and other precooked or cured meats, such as sausages or meat loaves. Fat from the back of a pig (fatback). Bratwurst. Salted nuts and seeds. Canned beans with added salt. Canned orsmoked fish. Whole eggs or egg yolks. Chicken or  [place] with skin. Dairy Whole or 2% milk, cream, and half-and-half. Whole or full-fat cream cheese. Whole-fat or sweetened yogurt. Full-fat cheese. Nondairy creamers. Whippedtoppings. Processed cheese and cheese spreads. Fats and oils Butter. Stick margarine. Lard. Shortening. Ghee. Bacon fat. Tropical oils, suchas coconut, palm kernel, or palm oil. Seasonings and condiments Onion salt, garlic salt, seasoned salt, table salt, and sea salt. Worcestershire sauce. Tartar sauce. Barbecue sauce. Teriyaki sauce. Soy sauce, including reduced-sodium. Steak sauce. Canned and packaged gravies. Fish sauce. Oyster sauce. Cocktail sauce. Store-bought horseradish. Ketchup. Mustard. Meat flavorings and tenderizers. Bouillon cubes. Hot sauces. Pre-made or packaged marinades. Pre-made or packaged taco seasonings. Relishes. Regular saladdressings. Other foods Salted popcorn and pretzels. The items listed above may not be a complete list of foods and beverages you should avoid. Contact a dietitian for more information. Where to find more information National Heart, Lung, and Blood Institute: https://wilson-eaton.com/ American Heart Association: www.heart.org Academy of Nutrition and Dietetics: www.eatright.[place]: www.kidney.org Summary The DASH eating plan is a healthy eating plan that has been shown to reduce high blood pressure (hypertension). It may also reduce your risk for type 2 diabetes, heart disease, and stroke. When on the DASH eating plan, aim to eat more fresh fruits and vegetables, whole grains, lean proteins, low-fat dairy, and heart-healthy fats. With the DASH eating  plan, you should limit salt (sodium) intake to 2,300 mg a day. If you have hypertension, you may need to reduce your sodium intake to 1,500 mg a day. Work with your health care provider or dietitian to adjust your eating plan to your individual calorie needs. This information is not intended to replace advice given to  you by your health care provider. Make sure you discuss any questions you have with your healthcare provider. Document Revised: 01/31/2019 Document Reviewed: 01/31/2019 Elsevier Patient Education  2022 [facility], BSN RN Case Manager [facility] [phone]   CLINICAL CARE PLAN: Patient Care Plan: Diabetes Type 2 (Adult)     Problem Identified: Glycemic Management (Diabetes, Type 2)   Priority: Medium     Long-Range Goal: Glycemic Management Optimized   Start Date: 09/17/2020  Expected End Date: 03/20/2021  This Visit's Progress: On track  Priority: Medium  Note:   Objective:  Lab Results  Component Value Date   HGBA1C 6.8 (H) 05/25/2020   Lab Results  Component Value Date   CREATININE 1.56 (H) 05/14/2020   CREATININE 1.63 (H) 12/01/2019   CREATININE 2.27 (H) 11/04/2019   No results found for: EGFR Current Barriers:  Knowledge Deficits related to basic Diabetes pathophysiology and self care/management- does not adhere to ADA or any specific diet, pt reports he lives with spouse and has sister that he can call on if needed.  Pt reports he is independent in all aspects of his care, continues to drive, reports no issues with medications and states he is already getting assistance for one of his medications.  Pt reports he checks CBG once daily with readings 100-130. Case Manager Clinical Goal(s):  patient will demonstrate improved adherence to prescribed treatment plan for diabetes self care/management as evidenced by: daily monitoring and recording of CBG  adherence to ADA/ carb modified diet adherence to prescribed medication regimen contacting provider for new or worsened symptoms or questions Interventions:  Collaboration with Susy Frizzle, MD regarding development and update of comprehensive plan of care as evidenced by provider attestation and co-signature Inter-disciplinary care team collaboration (see longitudinal plan of  care) Provided education to patient about basic DM disease process Reviewed medications with patient and discussed importance of medication adherence Discussed plans with patient for ongoing care management follow up and provided patient with direct contact information for care management team Provided patient with written educational materials related to hypo and hyperglycemia and importance of correct treatment Reviewed scheduled/upcoming provider appointments including: pt to make appointment with primary care provider office for Annual Wellness Visit, verbalizes understanding Review of patient status, including review of consultants reports, relevant laboratory and other test results, and medications completed. Reviewed importance of following ADA diet and being mindful of carbohydrate intake Reviewed importance of exercising daily Self-Care Activities - Attends all scheduled provider appointments Checks blood sugars as prescribed and utilize hyper and hypoglycemia protocol as needed Adheres to prescribed ADA/carb modified Patient Goals: - manage portion size - check feet daily for cuts, sores or redness - wash and dry feet carefully every day - wear comfortable, cotton socks - wear comfortable, well-fitting shoes - check blood sugar at prescribed times - check blood sugar if I feel it is too high or too low - enter blood sugar readings and medication or insulin into daily log - take the blood sugar log to all doctor visits - take the blood sugar meter to all doctor visits  - be mindful of  carbohydrate intake, foods such as pasta, potatoes, bread can elevate blood sugar - avoid/ limit concentrated sweets such as cakes, pies, cookies - please review education sent via My Chart- hypoglycemia Follow Up Plan: Telephone follow up appointment with care management team member scheduled for:   10/29/2020    Patient Care Plan: Hypertension (Adult)     Problem Identified: Hypertension  (Hypertension)   Priority: High     Long-Range Goal: Hypertension Monitored   Start Date: 09/17/2020  Expected End Date: 03/20/2021  This Visit's Progress: On track  Priority: High  Note:   Objective:  Last practice recorded BP readings:  BP Readings from Last 3 Encounters:  05/25/20 (!) 162/68  05/18/20 (!) 162/82  05/05/20 (!) 171/77   Most recent eGFR/CrCl: No results found for: EGFR  No components found for: CRCL Current Barriers:  Knowledge Deficits related to basic understanding of hypertension pathophysiology and self care management- pt does not adhere to any specific diet Does not adhere to provider recommendations re:  does not check BP daily, checks few times per week.  Pt reports he has all medications and takes as prescribed. Case Manager Clinical Goal(s):  patient will verbalize understanding of plan for hypertension management patient will attend all scheduled medical appointments: upcoming annual wellness visit due patient will demonstrate improved adherence to prescribed treatment plan for hypertension as evidenced by taking all medications as prescribed, monitoring and recording blood pressure as directed, adhering to low sodium/DASH diet Interventions:  Collaboration with Susy Frizzle, MD regarding development and update of comprehensive plan of care as evidenced by provider attestation and co-signature Inter-disciplinary care team collaboration (see longitudinal plan of care) Evaluation of current treatment plan related to hypertension self management and patient's adherence to plan as established by provider. Provided education to patient re: stroke prevention, s/s of heart attack and stroke, DASH diet, complications of uncontrolled blood pressure Reviewed medications with patient and discussed importance of compliance Discussed plans with patient for ongoing care management follow up and provided patient with direct contact information for care management  team Advised patient, providing education and rationale, to monitor blood pressure at least 3 times per week and record, calling PCP for findings outside established parameters.  Reviewed scheduled/upcoming provider appointments including: pt to schedule Annual Wellness Visit with primary care provider office, verbalizes understanding Reviewed importance of adherence to low sodium diet Try to exercise daily/ walking is good Education on low sodium/ DASH diet sent to pt via My Chart Self-Care Activities: Attends all scheduled provider appointments Calls provider office for new concerns, questions, or BP outside discussed parameters Checks BP and records as discussed Follows a low sodium diet/DASH diet Patient Goals: - check blood pressure 3 times per week - write blood pressure results in a log or diary and take to doctor's appointments - follow a low salt diet - look over education on low sodium/ DASH diet sent via My Chart - try to exercise daily Follow Up Plan: Telephone follow up appointment with care management team member scheduled for:

## 2020-09-18 ENCOUNTER — Other Ambulatory Visit: Payer: Self-pay | Admitting: Family Medicine

## 2020-10-02 ENCOUNTER — Encounter: Payer: Self-pay | Admitting: Family Medicine

## 2020-10-04 MED ORDER — TRUEPLUS LANCETS 33G MISC: 3 refills | Status: DC

## 2020-10-29 ENCOUNTER — Ambulatory Visit (INDEPENDENT_AMBULATORY_CARE_PROVIDER_SITE_OTHER): Payer: Medicare HMO | Admitting: *Deleted

## 2020-10-29 DIAGNOSIS — E119 Type 2 diabetes mellitus without complications: Secondary | ICD-10-CM | POA: Diagnosis not present

## 2020-10-29 DIAGNOSIS — I10 Essential (primary) hypertension: Secondary | ICD-10-CM | POA: Diagnosis not present

## 2020-10-29 NOTE — Chronic Care Management (AMB) (Signed)
Chronic Care Management   CCM RN Visit Note  10/29/2020 Name: Juan [name] MRN: [id number] DOB: 1940-03-14  Subjective: Juan [name] is a 80 y.o. year old male who is a primary care patient of Pickard, Cammie Mcgee, MD. The care management team was consulted for assistance with disease management and care coordination needs.    Engaged with patient by telephone for follow up visit in response to provider referral for case management and/or care coordination services.   Consent to Services:  The patient was given information about Chronic Care Management services, agreed to services, and gave verbal consent prior to initiation of services.  Please see initial visit note for detailed documentation.   Patient agreed to services and verbal consent obtained.   Assessment: Review of patient past medical history, allergies, medications, health status, including review of consultants reports, laboratory and other test data, was performed as part of comprehensive evaluation and provision of chronic care management services.   SDOH (Social Determinants of Health) assessments and interventions performed:    CCM Care Plan  Allergies  Allergen Reactions   Jardiance [Empagliflozin]     Severe nausea-     Outpatient Encounter Medications as of 10/29/2020  Medication Sig   allopurinol (ZYLOPRIM) 300 MG tablet TAKE 1 TABLET EVERY DAY   aspirin EC 81 MG tablet Take 81 mg by mouth daily.   Blood Glucose Monitoring Suppl (TRUE METRIX METER) w/Device KIT Use as Directed   diltiazem (CARDIZEM CD) 240 MG 24 hr capsule TAKE 1 CAPSULE EVERY DAY   doxazosin (CARDURA) 8 MG tablet Take 1 tablet (8 mg total) by mouth daily.   Dulaglutide (TRULICITY) 1.5 OX/7.3ZH SOPN Inject 1.5 mg into the skin once a week. Begin after 0.95m completed.   furosemide (LASIX) 40 MG tablet Take 1 tablet (40 mg total) by mouth daily.   glipiZIDE (GLUCOTROL) 5 MG tablet TAKE 1 TABLET TWICE DAILY BEFORE MEALS   ketorolac  (ACULAR) 0.5 % ophthalmic solution Place 1 drop into the left eye 2 (two) times daily.   Lancet Devices (PRODIGY LANCING DEVICE) MISC Use bid DX e11.9   lisinopril (ZESTRIL) 40 MG tablet TAKE 1 TABLET EVERY DAY   metoprolol succinate (TOPROL-XL) 25 MG 24 hr tablet TAKE 1 TABLET EVERY DAY   Multiple Vitamin (MULTIVITAMIN WITH MINERALS) TABS Take 1 tablet by mouth daily.   Omega-3 Fatty Acids (FISH OIL PO) Take by mouth.   pantoprazole (PROTONIX) 20 MG tablet Take 1 tablet (20 mg total) by mouth daily.   potassium chloride SA (KLOR-CON) 20 MEQ tablet Take 1 tablet (20 mEq total) by mouth daily.   rOPINIRole (REQUIP) 0.5 MG tablet Take 1 tablet (0.5 mg total) by mouth at bedtime.   rosuvastatin (CRESTOR) 20 MG tablet TAKE 1 TABLET EVERY DAY   TRUE METRIX BLOOD GLUCOSE TEST test strip CHECK BLOOD SUGAR EVERY DAY   TRUEplus Lancets 33G MISC Check BS QD - dx: e11.9   No facility-administered encounter medications on file as of 10/29/2020.    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   OSA (obstructive sleep apnea) 09/18/2019   Cardiac arrhythmia 08/21/2019   Cystoid macular edema of right eye 07/14/2019   Vitreomacular adhesion of left eye 07/14/2019   Cystoid macular edema of left eye  07/14/2019   Vitreomacular adhesion of both eyes 07/14/2019   Retinopathy due to secondary diabetes mellitus, with macular edema, 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[place]) 07/03/2019   CAD S/P percutaneous coronary angioplasty 07/03/2019   CKD (chronic kidney disease) stage 3, GFR 30-59 ml/min (HCC)    Hypertension 05/09/2017   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    PAD (peripheral artery disease) ([place])    Personal history of colonic polyps  09/09/2012   Diabetes mellitus without complication (HCC)    Hyperlipidemia    Neuromuscular disorder ([place])    CAD (coronary artery disease)    AAA (abdominal aortic aneurysm) ([place]) 01/02/2012    Conditions to be addressed/monitored:HTN and DMII  Care Plan : Diabetes Type 2 (Adult)  Updates made by Kassie Mends, RN since 10/29/2020 12:00 AM     Problem: Glycemic Management (Diabetes, Type 2)   Priority: Medium     Long-Range Goal: Glycemic Management Optimized   Start Date: 09/17/2020  Expected End Date: 03/20/2021  This Visit's Progress: On track  Recent Progress: On track  Priority: Medium  Note:   Objective:  Lab Results  Component Value Date   HGBA1C 6.8 (H) 05/25/2020   Lab Results  Component Value Date   CREATININE 1.56 (H) 05/14/2020   CREATININE 1.63 (H) 12/01/2019   CREATININE 2.27 (H) 11/04/2019   No results found for: EGFR Current Barriers:  Knowledge Deficits related to basic Diabetes pathophysiology and self care/management- does not adhere to ADA or any specific diet, pt reports he lives with spouse and has sister that he can call on if needed.  Pt reports he is independent in all aspects of his care, continues to drive, reports no issues with medications and states he is already getting assistance for one of his medications.  Pt reports he checks CBG once daily with readings 100-130 with today's CBG 117. Case Manager Clinical Goal(s):  patient will demonstrate improved adherence to prescribed treatment plan for diabetes self care/management as evidenced by: daily monitoring and recording of CBG  adherence to ADA/ carb modified diet adherence to prescribed medication regimen contacting provider for new or worsened symptoms or questions Interventions:  Collaboration with Susy Frizzle, MD regarding development and update of comprehensive plan of care as evidenced by provider attestation and co-signature Inter-disciplinary care team collaboration (see  longitudinal plan of care) Reviewed medications with patient and discussed importance of medication adherence Reinforced plans with patient for ongoing care management follow up and provided patient with direct contact information for care management team Reviewed scheduled/upcoming provider appointments including: pt reports he is to make appointment in September Review of patient status, including review of consultants reports, relevant laboratory and other test results, and medications completed. Reinforced importance of following ADA diet and being mindful of carbohydrate intake Sent diabetes diet education via My Chart Reinforced importance of exercising daily Self-Care Activities  Attends all scheduled provider appointments Checks blood sugars as prescribed and utilize hyper and hypoglycemia protocol as needed Adheres to prescribed ADA/carb modified Patient Goals: - manage portion size - check feet daily for cuts, sores or redness - wash and dry feet carefully every day - wear comfortable, cotton socks - wear comfortable, well-fitting shoes - check blood sugar at prescribed times and enter into log - check blood sugar if I feel it is too high or too low - take the blood sugar log and meter to all doctor visits - continue to watch carbohydrate intake, foods such as pasta, potatoes,  bread can elevate blood sugar - avoid/ limit concentrated sweets such as cakes, pies, cookies - please review education sent via My Chart- diabetes diet Follow Up Plan: Telephone follow up appointment with care management team member scheduled for:   12/24/2020    Care Plan : Hypertension (Adult)  Updates made by Kassie Mends, RN since 10/29/2020 12:00 AM     Problem: Hypertension (Hypertension)   Priority: High     Long-Range Goal: Hypertension Monitored   Start Date: 09/17/2020  Expected End Date: 03/20/2021  This Visit's Progress: On track  Recent Progress: On track  Priority: High  Note:    Objective:  Last practice recorded BP readings:  BP Readings from Last 3 Encounters:  05/25/20 (!) 162/68  05/18/20 (!) 162/82  05/05/20 (!) 171/77   Most recent eGFR/CrCl: No results found for: EGFR  No components found for: CRCL Current Barriers:  Knowledge Deficits related to basic understanding of hypertension pathophysiology and self care management- pt does not adhere to any specific diet Does not adhere to provider recommendations re:  Patient reports he is checking blood pressure daily and has had no systolic readings above 409 and no diastolic readings above 90.  Pt reports he has all medications and takes as prescribed. Case Manager Clinical Goal(s):  patient will verbalize understanding of plan for hypertension management patient will attend all scheduled medical appointments: upcoming annual wellness visit due patient will demonstrate improved adherence to prescribed treatment plan for hypertension as evidenced by taking all medications as prescribed, monitoring and recording blood pressure as directed, adhering to low sodium/DASH diet Interventions:  Collaboration with Susy Frizzle, MD regarding development and update of comprehensive plan of care as evidenced by provider attestation and co-signature Inter-disciplinary care team collaboration (see longitudinal plan of care) Evaluation of current treatment plan related to hypertension self management and patient's adherence to plan as established by provider. Reinforced education to patient re: stroke prevention, s/s of heart attack and stroke, DASH diet, complications of uncontrolled blood pressure Reviewed medications with patient and discussed importance of compliance Reinforced plans with patient for ongoing care management follow up and provided patient with direct contact information for care management team Advised patient, providing education and rationale, to monitor blood pressure at least 3 times per week and  record, calling PCP for findings outside established parameters.  Reviewed scheduled/upcoming provider appointments including: pt to schedule Annual Wellness Visit with primary care provider office, verbalizes understanding Reinforced importance of adherence to low sodium diet Reinforced importance of exercising and continuing to attend [place] [facility] [place] sent to pt via My Chart Self-Care Activities: Attends all scheduled provider appointments Calls provider office for new concerns, questions, or BP outside discussed parameters Checks BP and records as discussed Follows a low sodium diet/DASH diet Patient Goals: - continue to check blood pressure daily - write blood pressure results in a log or diary and take to doctor's appointments - follow a low salt diet- avoid salty snacks and fast food - look over education on heart healthy diet - try to exercise daily, continue going to [facility]- keep up the good work - take all medications as prescribed  Follow Up Plan: Telephone follow up appointment with care management team member scheduled for:   12/24/2020     Plan:Telephone follow up appointment with care management team member scheduled for:  12/24/2020  Jacqlyn Larsen [facility], BSN RN Case Manager [facility] [phone]

## 2020-10-29 NOTE — Patient Instructions (Addendum)
Visit Information  PATIENT GOALS:  Goals Addressed             This Visit's Progress    Monitor and Manage My Blood Sugar-Diabetes Type 2       Timeframe:  Long-Range Goal Priority:  Medium Start Date:    09/17/2020                         Expected End Date:    03/20/2021                   Follow Up Date- 12/24/2020   - check blood sugar at prescribed times and enter into log - check blood sugar if I feel it is too high or too low - take the blood sugar log and meter to all doctor visits - continue to watch carbohydrate intake, foods such as pasta, potatoes, bread can elevate blood sugar - avoid/ limit concentrated sweets such as cakes, pies, cookies - please review education sent via My Chart- diabetes diet   Why is this important?   Checking your blood sugar at home helps to keep it from getting very high or very low.  Writing the results in a diary or log helps the doctor know how to care for you.  Your blood sugar log should have the time, date and the results.  Also, write down the amount of insulin or other medicine that you take.  Other information, like what you ate, exercise done and how you were feeling, will also be helpful.     Notes:      Track and Manage My Blood Pressure-Hypertension       Timeframe:  Long-Range Goal Priority:  High Start Date:           09/17/2020                  Expected End Date:   03/20/2021                    Follow Up Date- 12/24/2020   - continue to check blood pressure daily - write blood pressure results in a log or diary and take to doctor's appointments - follow a low salt diet- avoid salty snacks and fast food - look over education on heart healthy diet - try to exercise daily, continue going to [facility] [employer]- keep up the good work - take all medications as prescribed   Why is this important?   You won't feel high blood pressure, but it can still hurt your blood vessels.  High blood pressure can cause heart or kidney problems. It  can also cause a stroke.  Making lifestyle changes like losing a little weight or eating less salt will help.  Checking your blood pressure at home and at different times of the day can help to control blood pressure.  If the doctor prescribes medicine remember to take it the way the doctor ordered.  Call the office if you cannot afford the medicine or if there are questions about it.     Notes:         Patient verbalizes understanding of instructions provided today and agrees to view in [place].   Telephone follow up appointment with care management team member scheduled for:  10/14/2022Diabetes Mellitus and Nutrition, Adult When you have diabetes, or diabetes mellitus, it is very important to have healthy eating habits because your blood sugar (glucose) levels are greatly affected by what  you eat and drink. Eating healthy foods in the right amounts, at about the same times every day, can help you: Control your blood glucose. Lower your risk of heart disease. Improve your blood pressure. Reach or maintain a healthy weight. What can affect my meal plan? Every person with diabetes is different, and each person has different needs for a meal plan. Your health care provider may recommend that you work with a dietitian to make a meal plan that is best for you. Your meal plan may vary depending on factors such as: The calories you need. The medicines you take. Your weight. Your blood glucose, blood pressure, and cholesterol levels. Your activity level. Other health conditions you have, such as heart or kidney disease. How do carbohydrates affect me? Carbohydrates, also called carbs, affect your blood glucose level more than any other type of food. Eating carbs naturally raises the amount of glucose in your blood. Carb counting is a method for keeping track of how many carbs you eat. Counting carbs is important to keep your blood glucose at a healthy level,especially if you use insulin or take  certain oral diabetes medicines. It is important to know how many carbs you can safely have in each meal. This is different for every person. Your dietitian can help you calculate how manycarbs you should have at each meal and for each snack. How does alcohol affect me? Alcohol can cause a sudden decrease in blood glucose (hypoglycemia), especially if you use insulin or take certain oral diabetes medicines. Hypoglycemia can be a life-threatening condition. Symptoms of hypoglycemia, such as sleepiness, dizziness, and confusion, are similar to symptoms of having too much alcohol. Do not drink alcohol if: Your health care provider tells you not to drink. You are pregnant, may be pregnant, or are planning to become pregnant. If you drink alcohol: Do not drink on an empty stomach. Limit how much you use to: 0-1 drink a day for women. 0-2 drinks a day for men. Be aware of how much alcohol is in your drink. In the [place] one drink equals one 12 oz bottle of beer (355 mL), one 5 oz glass of wine (148 mL), or one 1 oz glass of hard liquor (44 mL). Keep yourself hydrated with water, diet soda, or unsweetened iced tea. Keep in mind that regular soda, juice, and other mixers may contain a lot of sugar and must be counted as carbs. What are tips for following this plan?  Reading food labels Start by checking the serving size on the "Nutrition Facts" label of packaged foods and drinks. The amount of calories, carbs, fats, and other nutrients listed on the label is based on one serving of the item. Many items contain more than one serving per package. Check the total grams (g) of carbs in one serving. You can calculate the number of servings of carbs in one serving by dividing the total carbs by 15. For example, if a food has 30 g of total carbs per serving, it would be equal to 2 servings of carbs. Check the number of grams (g) of saturated fats and trans fats in one serving. Choose foods that have a low  amount or none of these fats. Check the number of milligrams (mg) of salt (sodium) in one serving. Most people should limit total sodium intake to less than 2,300 mg per day. Always check the nutrition information of foods labeled as "low-fat" or "nonfat." These foods may be higher in added sugar or refined  carbs and should be avoided. Talk to your dietitian to identify your daily goals for nutrients listed on the label. Shopping Avoid buying canned, pre-made, or processed foods. These foods tend to be high in fat, sodium, and added sugar. Shop around the outside edge of the grocery store. This is where you will most often find fresh fruits and vegetables, bulk grains, fresh meats, and fresh dairy. Cooking Use low-heat cooking methods, such as baking, instead of high-heat cooking methods like deep frying. Cook using healthy oils, such as olive, canola, or sunflower oil. Avoid cooking with butter, cream, or high-fat meats. Meal planning Eat meals and snacks regularly, preferably at the same times every day. Avoid going long periods of time without eating. Eat foods that are high in fiber, such as fresh fruits, vegetables, beans, and whole grains. Talk with your dietitian about how many servings of carbs you can eat at each meal. Eat 4-6 oz (112-168 g) of lean protein each day, such as lean meat, chicken, fish, eggs, or tofu. One ounce (oz) of lean protein is equal to: 1 oz (28 g) of meat, chicken, or fish. 1 egg.  cup (62 g) of tofu. Eat some foods each day that contain healthy fats, such as avocado, nuts, seeds, and fish. What foods should I eat? Fruits Berries. Apples. Oranges. Peaches. Apricots. Plums. Grapes. Mango. Papaya.Pomegranate. Kiwi. Cherries. Vegetables Lettuce. Spinach. Leafy greens, including kale, chard, collard greens, and mustard greens. Beets. Cauliflower. Cabbage. Broccoli. Carrots. Green beans.Tomatoes. Peppers. Onions. Cucumbers. Brussels sprouts. Grains Whole grains,  such as whole-wheat or whole-grain bread, crackers, tortillas,cereal, and pasta. Unsweetened oatmeal. Quinoa. Brown or wild rice. Meats and other proteins Seafood. Poultry without skin. Lean cuts of poultry and beef. Tofu. Nuts. Seeds. Dairy Low-fat or fat-free dairy products such as milk, yogurt, and cheese. The items listed above may not be a complete list of foods and beverages you can eat. Contact a dietitian for more information. What foods should I avoid? Fruits Fruits canned with syrup. Vegetables Canned vegetables. Frozen vegetables with butter or cream sauce. Grains Refined white flour and flour products such as bread, pasta, snack foods, andcereals. Avoid all processed foods. Meats and other proteins Fatty cuts of meat. Poultry with skin. Breaded or fried meats. Processed meat.Avoid saturated fats. Dairy Full-fat yogurt, cheese, or milk. Beverages Sweetened drinks, such as soda or iced tea. The items listed above may not be a complete list of foods and beverages you should avoid. Contact a dietitian for more information. Questions to ask a health care provider Do I need to meet with a diabetes educator? Do I need to meet with a dietitian? What number can I call if I have questions? When are the best times to check my blood glucose? Where to find more information: American Diabetes Association: diabetes.org Academy of Nutrition and Dietetics: www.eatright.[employer] of Diabetes and Digestive and Kidney Diseases: [place] Association of Diabetes Care and Education Specialists: www.diabeteseducator.org Summary It is important to have healthy eating habits because your blood sugar (glucose) levels are greatly affected by what you eat and drink. A healthy meal plan will help you control your blood glucose and maintain a healthy lifestyle. Your health care provider may recommend that you work with a dietitian to make a meal plan that is best for you. Keep in  mind that carbohydrates (carbs) and alcohol have immediate effects on your blood glucose levels. It is important to count carbs and to use alcohol carefully. This information is not intended to  replace advice given to you by your health care provider. Make sure you discuss any questions you have with your healthcare provider. Document Revised: 02/04/2019 Document Reviewed: 02/04/2019 Elsevier Patient Education  2021 [place]. Heart-Healthy Eating Plan Heart-healthy meal planning includes: Eating less unhealthy fats. Eating more healthy fats. Making other changes in your diet. Talk with your doctor or a diet specialist (dietitian) to create an eating plan that is right for you. What is my plan? Your doctor may recommend an eating plan that includes: Total fat: ______% or less of total calories a day. Saturated fat: ______% or less of total calories a day. Cholesterol: less than _________mg a day. What are tips for following this plan? Cooking Avoid frying your food. Try to bake, boil, grill, or broil it instead. You can also reduce fat by: Removing the skin from poultry. Removing all visible fats from meats. Steaming vegetables in water or broth. Meal planning  At meals, divide your plate into four equal parts: Fill one-half of your plate with vegetables and green salads. Fill one-fourth of your plate with whole grains. Fill one-fourth of your plate with lean protein foods. Eat 4-5 servings of vegetables per day. A serving of vegetables is: 1 cup of raw or cooked vegetables. 2 cups of raw leafy greens. Eat 4-5 servings of fruit per day. A serving of fruit is: 1 medium whole fruit.  cup of dried fruit.  cup of fresh, frozen, or canned fruit.  cup of 100% fruit juice. Eat more foods that have soluble fiber. These are apples, broccoli, carrots, beans, peas, and barley. Try to get 20-30 g of fiber per day. Eat 4-5 servings of nuts, legumes, and seeds per week: 1 serving of  dried beans or legumes equals  cup after being cooked. 1 serving of nuts is  cup. 1 serving of seeds equals 1 tablespoon.  General information Eat more home-cooked food. Eat less restaurant, buffet, and fast food. Limit or avoid alcohol. Limit foods that are high in starch and sugar. Avoid fried foods. Lose weight if you are overweight. Keep track of how much salt (sodium) you eat. This is important if you have high blood pressure. Ask your doctor to tell you more about this. Try to add vegetarian meals each week. Fats Choose healthy fats. These include olive oil and canola oil, flaxseeds, walnuts, almonds, and seeds. Eat more omega-3 fats. These include salmon, mackerel, sardines, tuna, flaxseed oil, and ground flaxseeds. Try to eat fish at least 2 times each week. Check food labels. Avoid foods with trans fats or high amounts of saturated fat. Limit saturated fats. These are often found in animal products, such as meats, butter, and cream. These are also found in plant foods, such as palm oil, palm kernel oil, and coconut oil. Avoid foods with partially hydrogenated oils in them. These have trans fats. Examples are stick margarine, some tub margarines, cookies, crackers, and other baked goods. What foods can I eat? Fruits All fresh, canned (in natural juice), or frozen fruits. Vegetables Fresh or frozen vegetables (raw, steamed, roasted, or grilled). Green salads. Grains Most grains. Choose whole wheat and whole grains most of the time. Rice andpasta, including brown rice and pastas made with whole wheat. Meats and other proteins Lean, well-trimmed beef, veal, pork, and lamb. Chicken and [place] without skin. All fish and shellfish. Wild duck, rabbit, pheasant, and venison. Egg whites or low-cholesterol egg substitutes. Dried beans, peas, lentils, and tofu. Seedsand most nuts. Dairy Low-fat or nonfat cheeses,  including ricotta and mozzarella. Skim or 1% milk that is liquid, powdered,  or evaporated. Buttermilk that is made with low-fatmilk. Nonfat or low-fat yogurt. Fats and oils Non-hydrogenated (trans-free) margarines. Vegetable oils, including soybean, sesame, sunflower, olive, peanut, safflower, corn, canola, and cottonseed. Salad dressings or mayonnaisemade with a vegetable oil. Beverages Mineral water. Coffee and tea. Diet carbonated beverages. Sweets and desserts Sherbet, gelatin, and fruit ice. Small amounts of dark chocolate. Limit all sweets and desserts. Seasonings and condiments All seasonings and condiments. The items listed above may not be a complete list of foods and drinks you can eat. Contact a dietitian for more options. What foods should I avoid? Fruits Canned fruit in heavy syrup. Fruit in cream or butter sauce. Fried fruit. Limitcoconut. Vegetables Vegetables cooked in cheese, cream, or butter sauce. Fried vegetables. Grains Breads that are made with saturated or trans fats, oils, or whole milk. Croissants. Sweet rolls. Donuts. High-fat crackers,such as cheese crackers. Meats and other proteins Fatty meats, such as hot dogs, ribs, sausage, bacon, rib-eye roast or steak. High-fat deli meats, such as salami and bologna. Caviar. Domestic duck andgoose. Organ meats, such as liver. Dairy Cream, sour cream, cream cheese, and creamed cottage cheese. Whole-milk cheeses. Whole or 2% milk that is liquid, evaporated, or condensed. Whole buttermilk. Cream sauce or high-fat cheese sauce. Yogurt that is made fromwhole milk. Fats and oils Meat fat, or shortening. Cocoa butter, hydrogenated oils, palm oil, coconut oil, palm kernel oil. Solid fats and shortenings, including bacon fat, salt pork, lard, and butter. Nondairy cream substitutes. Salad dressings with cheeseor sour cream. Beverages Regular sodas and juice drinks with added sugar. Sweets and desserts Frosting. Pudding. Cookies. Cakes. Pies. Milk chocolate or white chocolate.Buttered syrups. Full-fat ice  cream or ice cream drinks. The items listed above may not be a complete list of foods and drinks to avoid. Contact a dietitian for more information. Summary Heart-healthy meal planning includes eating less unhealthy fats, eating more healthy fats, and making other changes in your diet. Eat a balanced diet. This includes fruits and vegetables, low-fat or nonfat dairy, lean protein, nuts and legumes, whole grains, and heart-healthy oils and fats. This information is not intended to replace advice given to you by your health care provider. Make sure you discuss any questions you have with your healthcare provider. Document Revised: 05/03/2017 Document Reviewed: 04/06/2017 Elsevier Patient Education  2022 [place]   Jacqlyn Larsen [facility], BSN RN Case Manager [facility] [phone]

## 2020-11-01 ENCOUNTER — Other Ambulatory Visit: Payer: Self-pay | Admitting: Family Medicine

## 2020-11-06 ENCOUNTER — Other Ambulatory Visit: Payer: Self-pay | Admitting: Family Medicine

## 2020-12-01 ENCOUNTER — Encounter: Payer: Self-pay | Admitting: Emergency Medicine

## 2020-12-01 ENCOUNTER — Other Ambulatory Visit: Payer: Self-pay

## 2020-12-01 ENCOUNTER — Ambulatory Visit
Admission: EM | Admit: 2020-12-01 | Discharge: 2020-12-01 | Disposition: A | Discharge status: Home / Self Care | Payer: Medicare HMO | Attending: Physician Assistant | Admitting: Physician Assistant

## 2020-12-01 DIAGNOSIS — J069 Acute upper respiratory infection, unspecified: Secondary | ICD-10-CM

## 2020-12-01 DIAGNOSIS — U071 COVID-19: Secondary | ICD-10-CM | POA: Diagnosis not present

## 2020-12-01 MED ORDER — BENZONATATE 100 MG PO CAPS: 100.0000 mg | ORAL_CAPSULE | Freq: Four times a day (QID) | ORAL | 0 refills | Status: DC | PRN

## 2020-12-01 MED ORDER — MOLNUPIRAVIR EUA 200MG CAPSULE: 4.0000 | ORAL_CAPSULE | Freq: Two times a day (BID) | ORAL | 0 refills | Status: AC

## 2020-12-01 NOTE — ED Triage Notes (Addendum)
Cough and runny nose since Sunday. Positive at home covid test

## 2020-12-01 NOTE — Discharge Instructions (Addendum)
Your covid and flu test are pending.

## 2020-12-03 NOTE — ED Provider Notes (Signed)
[facility]    CSN: [id number] Arrival date & time: 12/01/20  1338      History   Chief Complaint No chief complaint on file.   HPI Juan [name] is a 80 y.o. male.   The history is provided by the patient. No language interpreter was used.  Cough Cough characteristics:  Non-productive Sputum characteristics:  Nondescript Severity:  Moderate Onset quality:  Gradual Duration:  3 days Timing:  Constant Chronicity:  New Smoker: no   Context: sick contacts   Relieved by:  Nothing Worsened by:  Nothing Ineffective treatments:  None tried Associated symptoms: no fever   Risk factors: no recent infection    Past Medical History:  Diagnosis Date   AAA (abdominal aortic aneurysm) ([place]) 8/12   3.5cm   CAD (coronary artery disease)    CKD (chronic kidney disease) stage 3, GFR 30-59 ml/min (HCC)    Colon polyps    Diabetes mellitus without complication (HCC)    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   Hyperlipidemia    Hypertension    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    50-69% (12/2015)   Neuromuscular disorder ([place])    L2-3,L5-S1 bulging disc /nerve impingement   PAD (peripheral artery disease) ([place])     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   Neuropathy associated with endocrine disorder ([place]) 05/25/2020   Restless legs syndrome (RLS) 05/25/2020   Retinal telangiectasis, 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   OSA (obstructive sleep apnea) 09/18/2019   Cardiac arrhythmia 08/21/2019   Cystoid macular edema of right eye 07/14/2019   Vitreomacular adhesion of left eye 07/14/2019   Cystoid macular edema of left eye 07/14/2019   Vitreomacular adhesion of both eyes 07/14/2019   Retinopathy due to secondary diabetes mellitus, with macular edema, 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[place]) 07/03/2019   CAD S/P percutaneous coronary angioplasty  07/03/2019   CKD (chronic kidney disease) stage 3, GFR 30-59 ml/min (HCC)    Hypertension 05/09/2017   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    PAD (peripheral artery disease) ([place])    Personal history of colonic polyps 09/09/2012   Diabetes mellitus without complication (HCC)    Hyperlipidemia    Neuromuscular disorder (HCC)    CAD (coronary artery disease)    AAA (abdominal aortic aneurysm) ([place]) 01/02/2012    Past Surgical History:  Procedure Laterality Date   COLONOSCOPY N/A 09/27/2012   Procedure: COLONOSCOPY;  Surgeon: Daneil Dolin, MD;  [facility];  Service: Endoscopy;  Laterality: N/A;  9:30   CORONARY ANGIOPLASTY     1989   TONSILLECTOMY         Home Medications    Prior to Admission medications   Medication Sig Start Date End Date Taking? Authorizing Provider  benzonatate (TESSALON PERLES) 100 MG capsule Take 1 capsule (100 mg total) by mouth every 6 (six) hours as needed for cough. 12/01/20 12/01/21 Yes Caryl Ada K, PA-C  molnupiravir EUA (LAGEVRIO) 200 mg CAPS capsule Take 4 capsules (800 mg total) by mouth 2 (two) times daily for 5 days. 12/01/20 12/06/20 Yes Caryl Ada K, PA-C  allopurinol (ZYLOPRIM) 300 MG tablet TAKE 1 TABLET EVERY DAY 11/08/20   Susy Frizzle, MD  aspirin EC 81 MG tablet Take 81 mg by mouth daily.    [provider]  Blood Glucose Monitoring  Suppl (TRUE METRIX METER) w/Device KIT Use as Directed 07/18/16   Susy Frizzle, MD  diltiazem [facility] CD) 240 MG 24 hr capsule TAKE 1 CAPSULE EVERY DAY 07/06/20   Susy Frizzle, MD  doxazosin (CARDURA) 8 MG tablet Take 1 tablet (8 mg total) by mouth daily. 08/02/20   Susy Frizzle, MD  Dulaglutide (TRULICITY) 1.5 CB/6.3AG SOPN Inject 1.5 mg into the skin once a week. Begin after 0.38m completed. 01/12/20   PSusy Frizzle MD  furosemide (LASIX) 40 MG tablet Take 1 tablet (40 mg total) by mouth daily. 10/20/19   PSusy Frizzle MD  glipiZIDE  (GLUCOTROL) 5 MG tablet TAKE 1 TABLET TWICE DAILY BEFORE MEALS 09/20/20   PSusy Frizzle MD  ketorolac (ACULAR) 0.5 % ophthalmic solution Place 1 drop into the left eye 2 (two) times daily. 05/31/20   Rankin, GClent Demark MD  Lancet Devices (PRODIGY LANCING DEVICE) MISC Use bid DX e11.9 07/05/16   PSusy Frizzle MD  lisinopril (ZESTRIL) 40 MG tablet TAKE 1 TABLET EVERY DAY 11/08/20   PSusy Frizzle MD  metoprolol succinate (TOPROL-XL) 25 MG 24 hr tablet TAKE 1 TABLET EVERY DAY 07/06/20   PSusy Frizzle MD  Multiple Vitamin (MULTIVITAMIN WITH MINERALS) TABS Take 1 tablet by mouth daily.    [provider]  Omega-3 Fatty Acids (FISH OIL PO) Take by mouth.    [provider]  pantoprazole (PROTONIX) 20 MG tablet TAKE 1 TABLET EVERY DAY 11/01/20   PSusy Frizzle MD  potassium chloride SA (KLOR-CON) 20 MEQ tablet Take 1 tablet (20 mEq total) by mouth daily. 02/16/20   PSusy Frizzle MD  rOPINIRole (REQUIP) 0.5 MG tablet Take 1 tablet (0.5 mg total) by mouth at bedtime. 05/25/20   Dohmeier, CAsencion Partridge MD  rosuvastatin (CRESTOR) 20 MG tablet TAKE 1 TABLET EVERY DAY 11/01/20   PSusy Frizzle MD  TRUE METRIX BLOOD GLUCOSE TEST test strip CHECK BLOOD SUGAR EVERY DAY 08/24/20   PSusy Frizzle MD  TRUEplus Lancets 33G MISC Check BS QD - dx: e11.9 10/04/20   PSusy Frizzle MD    Family History Family History  Problem Relation Age of Onset   Diabetes Mother    Heart disease Mother        After age 82  Heart attack Mother    Hypertension Mother    Diabetes Father    Heart disease Father        After age 447  Hypertension Father    Heart attack Father    Brain cancer Sister    Cancer Sister        Brain   Diabetes Sister    Hypertension Sister    Cancer Brother        Chest Tumor   Diabetes Brother    Hypertension Brother    Deep vein thrombosis Sister    Diabetes Sister    Diabetes Brother        Bilateral leg   Colon cancer Neg Hx     Social  History Social History   Tobacco Use   Smoking status: Former    Years: 35.00    Types: Cigarettes    Quit date: 01/02/1988    Years since quitting: 32.9   Smokeless tobacco: Never  Vaping Use   Vaping Use: Never used  Substance Use Topics   Alcohol use: Not Currently    Comment: rare beer, no history of ETOH abuse  Drug use: No     Allergies   Jardiance [empagliflozin]   Review of Systems Review of Systems  Constitutional:  Negative for fever.  Respiratory:  Positive for cough.     Physical Exam Triage Vital Signs ED Triage Vitals  Enc Vitals Group     BP 12/01/20 1416 (!) 144/61     Pulse Rate 12/01/20 1416 81     Resp 12/01/20 1416 18     Temp 12/01/20 1416 98.3 F (36.8 C)     Temp Source 12/01/20 1416 Oral     SpO2 12/01/20 1416 95 %     Weight --      Height --      Head Circumference --      Peak Flow --      Pain Score 12/01/20 1417 0     Pain Loc --      Pain Edu? --      Excl. in [place]? --    No data found.  Updated Vital Signs BP (!) 144/61 (BP Location: Right Arm)   Pulse 81   Temp 98.3 F (36.8 C) (Oral)   Resp 18   SpO2 95%   Visual Acuity Right Eye Distance:   Left Eye Distance:   Bilateral Distance:    Right Eye Near:   Left Eye Near:    Bilateral Near:     Physical Exam   UC Treatments / Results  Labs (all labs ordered are listed, but only abnormal results are displayed) Labs Reviewed  COVID-19, FLU A+B NAA    EKG   Radiology No results found.  Procedures Procedures (including critical care time)  Medications Ordered in UC Medications - No data to display  Initial Impression / Assessment and Plan / UC Course  I have reviewed the triage vital signs and the nursing notes.  Pertinent labs & imaging results that were available during my care of the patient were reviewed by me and considered in my medical decision making (see chart for details).     (Positive home covid test)  Pt given rx for monupiravir.  I  advised follow up is any problems.  Final Clinical Impressions(s) / UC Diagnoses   Final diagnoses:  Viral URI     Discharge Instructions      Your covid and flu test are pending.     ED Prescriptions     Medication Sig Dispense Auth. Provider   benzonatate (TESSALON PERLES) 100 MG capsule Take 1 capsule (100 mg total) by mouth every 6 (six) hours as needed for cough. 30 capsule Heydy Montilla K, PA-C   molnupiravir EUA (LAGEVRIO) 200 mg CAPS capsule Take 4 capsules (800 mg total) by mouth 2 (two) times daily for 5 days. 40 capsule Fransico Meadow, [place]      PDMP not reviewed this encounter.   Fransico Meadow, [place] 12/03/20 1257

## 2020-12-09 ENCOUNTER — Other Ambulatory Visit: Payer: Medicare HMO

## 2020-12-09 ENCOUNTER — Other Ambulatory Visit: Payer: Self-pay

## 2020-12-09 DIAGNOSIS — E119 Type 2 diabetes mellitus without complications: Secondary | ICD-10-CM | POA: Diagnosis not present

## 2020-12-09 DIAGNOSIS — E785 Hyperlipidemia, unspecified: Secondary | ICD-10-CM

## 2020-12-09 DIAGNOSIS — N1832 Chronic kidney disease, stage 3b: Secondary | ICD-10-CM

## 2020-12-09 DIAGNOSIS — I10 Essential (primary) hypertension: Secondary | ICD-10-CM

## 2020-12-10 LAB — CBC WITH DIFFERENTIAL/PLATELET
Absolute Monocytes: 733 cells/uL (ref 200–950)
Basophils Absolute: 23 cells/uL (ref 0–200)
Basophils Relative: 0.3 %
Eosinophils Absolute: 172 cells/uL (ref 15–500)
Eosinophils Relative: 2.2 %
HCT: 35.1 % — ABNORMAL LOW (ref 38.5–50.0)
Hemoglobin: 11.6 g/dL — ABNORMAL LOW (ref 13.2–17.1)
Lymphs Abs: 1778 cells/uL (ref 850–3900)
MCH: 31 pg (ref 27.0–33.0)
MCHC: 33 g/dL (ref 32.0–36.0)
MCV: 93.9 fL (ref 80.0–100.0)
MPV: 10.2 fL (ref 7.5–12.5)
Monocytes Relative: 9.4 %
Neutro Abs: 5093 cells/uL (ref 1500–7800)
Neutrophils Relative %: 65.3 %
Platelets: 165 10*3/uL (ref 140–400)
RBC: 3.74 10*6/uL — ABNORMAL LOW (ref 4.20–5.80)
RDW: 13.8 % (ref 11.0–15.0)
Total Lymphocyte: 22.8 %
WBC: 7.8 10*3/uL (ref 3.8–10.8)

## 2020-12-10 LAB — HEMOGLOBIN A1C
Hgb A1c MFr Bld: 6.4 % of total Hgb — ABNORMAL HIGH (ref <5.7)
Mean Plasma Glucose: 137 mg/dL
eAG (mmol/L): 7.6 mmol/L

## 2020-12-10 LAB — COMPREHENSIVE METABOLIC PANEL
AG Ratio: 1.6 (calc) (ref 1.0–2.5)
ALT: 16 U/L (ref 9–46)
AST: 15 U/L (ref 10–35)
Albumin: 3.7 g/dL (ref 3.6–5.1)
Alkaline phosphatase (APISO): 72 U/L (ref 35–144)
BUN/Creatinine Ratio: 12 (calc) (ref 6–22)
BUN: 18 mg/dL (ref 7–25)
CO2: 25 mmol/L (ref 20–32)
Calcium: 9.1 mg/dL (ref 8.6–10.3)
Chloride: 105 mmol/L (ref 98–110)
Creat: 1.54 mg/dL — ABNORMAL HIGH (ref 0.70–1.22)
Globulin: 2.3 g/dL (calc) (ref 1.9–3.7)
Glucose, Bld: 143 mg/dL — ABNORMAL HIGH (ref 65–99)
Potassium: 4.7 mmol/L (ref 3.5–5.3)
Sodium: 140 mmol/L (ref 135–146)
Total Bilirubin: 0.5 mg/dL (ref 0.2–1.2)
Total Protein: 6 g/dL — ABNORMAL LOW (ref 6.1–8.1)

## 2020-12-10 LAB — LIPID PANEL
Cholesterol: 116 mg/dL (ref <200)
HDL: 34 mg/dL — ABNORMAL LOW (ref >=40)
LDL Cholesterol (Calc): 59 mg/dL (calc)
Non-HDL Cholesterol (Calc): 82 mg/dL (calc) (ref <130)
Total CHOL/HDL Ratio: 3.4 (calc) (ref <5.0)
Triglycerides: 143 mg/dL (ref <150)

## 2020-12-13 ENCOUNTER — Other Ambulatory Visit: Payer: Self-pay

## 2020-12-13 ENCOUNTER — Encounter: Payer: Self-pay | Admitting: Family Medicine

## 2020-12-13 ENCOUNTER — Ambulatory Visit (INDEPENDENT_AMBULATORY_CARE_PROVIDER_SITE_OTHER): Payer: Medicare HMO | Admitting: Family Medicine

## 2020-12-13 VITALS — BP 128/68 | HR 82 | Temp 98.5°F | Resp 16 | Ht 70.0 in | Wt 250.0 lb

## 2020-12-13 DIAGNOSIS — I2510 Atherosclerotic heart disease of native coronary artery without angina pectoris: Secondary | ICD-10-CM | POA: Diagnosis not present

## 2020-12-13 DIAGNOSIS — N1832 Chronic kidney disease, stage 3b: Secondary | ICD-10-CM

## 2020-12-13 DIAGNOSIS — I499 Cardiac arrhythmia, unspecified: Secondary | ICD-10-CM | POA: Diagnosis not present

## 2020-12-13 DIAGNOSIS — E119 Type 2 diabetes mellitus without complications: Secondary | ICD-10-CM | POA: Diagnosis not present

## 2020-12-13 DIAGNOSIS — Z0000 Encounter for general adult medical examination without abnormal findings: Secondary | ICD-10-CM

## 2020-12-13 DIAGNOSIS — Z0001 Encounter for general adult medical examination with abnormal findings: Secondary | ICD-10-CM

## 2020-12-13 DIAGNOSIS — I10 Essential (primary) hypertension: Secondary | ICD-10-CM

## 2020-12-13 NOTE — Progress Notes (Signed)
Subjective:    Patient ID: Juan [name], male    DOB: [birth year], 80 y.o.   MRN: [id number]  Patient is here today for complete physical exam.  He is due for a flu shot.  He defers that today as he is recently had COVID.  He is due for a COVID booster as well as the shingles vaccine.  He is up-to-date with his pneumonia vaccine both Pneumovax 23 and Prevnar 20.  Due to his age, he does not require colonoscopy or prostate cancer screening.  His most recent lab work is listed below showing excellent LDL cholesterol well below 70 as well as an hemoglobin A1c of 6.4.  He denies any memory loss.  He denies any falls.  He denies any depression.  On exam he has an irregularly irregular heartbeat however thankfully EKG shows normal sinus rhythm with frequent PVCs.  These are asymptomatic and therefore do not require treatment  Lab on 12/09/2020  Component Date Value Ref Range Status  . Cholesterol 12/09/2020 116  <200 mg/dL Final  . HDL 12/09/2020 34 (A) > OR = 40 mg/dL Final  . Triglycerides 12/09/2020 143  <150 mg/dL Final  . LDL Cholesterol (Calc) 12/09/2020 59  mg/dL (calc) Final   Comment: Reference range: <100 . Desirable range <100 mg/dL for primary prevention;   <70 mg/dL for patients with CHD or diabetic patients  with > or = 2 CHD risk factors. Marland Kitchen LDL-C is now calculated using the Martin-Hopkins  calculation, which is a validated novel method providing  better accuracy than the Friedewald equation in the  estimation of LDL-C.  Cresenciano Genre et al. Annamaria Helling. 5916;384(66): 2061-2068  (http://education.[employer].com/faq/FAQ164)   . Total CHOL/HDL Ratio 12/09/2020 3.4  <5.0 (calc) Final  . Non-HDL Cholesterol (Calc) 12/09/2020 82  <130 mg/dL (calc) Final   Comment: For patients with diabetes plus 1 major ASCVD risk  factor, treating to a non-HDL-C goal of <100 mg/dL  (LDL-C of <70 mg/dL) is considered a therapeutic  option.   . Glucose, Bld 12/09/2020 143 (A) 65 - 99 mg/dL Final    Comment: .            Fasting reference interval . For someone without known diabetes, a glucose value >125 mg/dL indicates that they may have diabetes and this should be confirmed with a follow-up test. .   . BUN 12/09/2020 18  7 - 25 mg/dL Final  . Creat 12/09/2020 1.54 (A) 0.70 - 1.22 mg/dL Final  . BUN/Creatinine Ratio 12/09/2020 12  6 - 22 (calc) Final  . Sodium 12/09/2020 140  135 - 146 mmol/L Final  . Potassium 12/09/2020 4.7  3.5 - 5.3 mmol/L Final  . Chloride 12/09/2020 105  98 - 110 mmol/L Final  . CO2 12/09/2020 25  20 - 32 mmol/L Final  . Calcium 12/09/2020 9.1  8.6 - 10.3 mg/dL Final  . Total Protein 12/09/2020 6.0 (A) 6.1 - 8.1 g/dL Final  . Albumin 12/09/2020 3.7  3.6 - 5.1 g/dL Final  . Globulin 12/09/2020 2.3  1.9 - 3.7 g/dL (calc) Final  . AG Ratio 12/09/2020 1.6  1.0 - 2.5 (calc) Final  . Total Bilirubin 12/09/2020 0.5  0.2 - 1.2 mg/dL Final  . Alkaline phosphatase (APISO) 12/09/2020 72  35 - 144 U/L Final  . AST 12/09/2020 15  10 - 35 U/L Final  . ALT 12/09/2020 16  9 - 46 U/L Final  . Hgb A1c MFr Bld 12/09/2020 6.4 (A) <5.7 % of total Hgb  Final   Comment: For someone without known diabetes, a hemoglobin  A1c value between 5.7% and 6.4% is consistent with prediabetes and should be confirmed with a  follow-up test. . For someone with known diabetes, a value <7% indicates that their diabetes is well controlled. A1c targets should be individualized based on duration of diabetes, age, comorbid conditions, and other considerations. . This assay result is consistent with an increased risk of diabetes. . Currently, no consensus exists regarding use of hemoglobin A1c for diagnosis of diabetes for children. .   . Mean Plasma Glucose 12/09/2020 137  mg/dL Final  . eAG (mmol/L) 12/09/2020 7.6  mmol/L Final  . WBC 12/09/2020 7.8  3.8 - 10.8 Thousand/uL Final  . RBC 12/09/2020 3.74 (A) 4.20 - 5.80 Million/uL Final  . Hemoglobin 12/09/2020 11.6 (A) 13.2 - 17.1  g/dL Final  . HCT 12/09/2020 35.1 (A) 38.5 - 50.0 % Final  . MCV 12/09/2020 93.9  80.0 - 100.0 fL Final  . MCH 12/09/2020 31.0  27.0 - 33.0 pg Final  . MCHC 12/09/2020 33.0  32.0 - 36.0 g/dL Final  . RDW 12/09/2020 13.8  11.0 - 15.0 % Final  . Platelets 12/09/2020 165  140 - 400 Thousand/uL Final  . MPV 12/09/2020 10.2  7.5 - 12.5 fL Final  . Neutro Abs 12/09/2020 5,093  1,500 - 7,800 cells/uL Final  . Lymphs Abs 12/09/2020 1,778  850 - 3,900 cells/uL Final  . Absolute Monocytes 12/09/2020 733  200 - 950 cells/uL Final  . Eosinophils Absolute 12/09/2020 172  15 - 500 cells/uL Final  . Basophils Absolute 12/09/2020 23  0 - 200 cells/uL Final  . Neutrophils Relative % 12/09/2020 65.3  % Final  . Total Lymphocyte 12/09/2020 22.8  % Final  . Monocytes Relative 12/09/2020 9.4  % Final  . Eosinophils Relative 12/09/2020 2.2  % Final  . Basophils Relative 12/09/2020 0.3  % Final    Past Medical History:  Diagnosis Date  . AAA (abdominal aortic aneurysm) 8/12   3.5cm  . CAD (coronary artery disease)   . CKD (chronic kidney disease) stage 3, GFR 30-59 ml/min (HCC)   . Colon polyps   . Diabetes mellitus without complication ([place])   . 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 Hyperlipidemia   . Hypertension   . 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 Neuromuscular disorder ([place])    L2-3,L5-S1 bulging disc /nerve impingement  . PAD (peripheral artery disease) ([place])    Past Surgical History:  Procedure Laterality Date  . COLONOSCOPY N/A 09/27/2012   Procedure: COLONOSCOPY;  Surgeon: Daneil Dolin, MD;  [facility];  Service: Endoscopy;  Laterality: N/A;  9:30  . CORONARY ANGIOPLASTY     1989  . TONSILLECTOMY     Current Outpatient Medications on File Prior to Visit  Medication Sig Dispense Refill  . allopurinol (ZYLOPRIM) 300 MG tablet TAKE 1 TABLET EVERY DAY 90 tablet 3  . aspirin EC 81 MG tablet Take 81 mg by  mouth daily.    . benzonatate (TESSALON PERLES) 100 MG capsule Take 1 capsule (100 mg total) by mouth every 6 (six) hours as needed for cough. 30 capsule 0  . Blood Glucose Monitoring Suppl (TRUE METRIX METER) w/Device KIT Use as Directed 1 kit 1  . diltiazem (CARDIZEM CD) 240 MG 24 hr capsule TAKE 1 CAPSULE EVERY DAY 90 capsule 1  . doxazosin (CARDURA) 8  MG tablet Take 1 tablet (8 mg total) by mouth daily. 90 tablet 1  . Dulaglutide (TRULICITY) 1.5 GN/0.0BB SOPN Inject 1.5 mg into the skin once a week. Begin after 0.87m completed. 4 mL 11  . furosemide (LASIX) 40 MG tablet Take 1 tablet (40 mg total) by mouth daily. 30 tablet 3  . glipiZIDE (GLUCOTROL) 5 MG tablet TAKE 1 TABLET TWICE DAILY BEFORE MEALS 180 tablet 2  . ketorolac (ACULAR) 0.5 % ophthalmic solution Place 1 drop into the left eye 2 (two) times daily. 5 mL 4  . Lancet Devices (PRODIGY LANCING DEVICE) MISC Use bid DX e11.9 1 each 2  . lisinopril (ZESTRIL) 40 MG tablet TAKE 1 TABLET EVERY DAY 90 tablet 3  . metoprolol succinate (TOPROL-XL) 25 MG 24 hr tablet TAKE 1 TABLET EVERY DAY 90 tablet 3  . Multiple Vitamin (MULTIVITAMIN WITH MINERALS) TABS Take 1 tablet by mouth daily.    . Omega-3 Fatty Acids (FISH OIL PO) Take by mouth.    . pantoprazole (PROTONIX) 20 MG tablet TAKE 1 TABLET EVERY DAY 90 tablet 3  . potassium chloride SA (KLOR-CON) 20 MEQ tablet Take 1 tablet (20 mEq total) by mouth daily. 90 tablet 3  . rOPINIRole (REQUIP) 0.5 MG tablet Take 1 tablet (0.5 mg total) by mouth at bedtime. 30 tablet 3  . rosuvastatin (CRESTOR) 20 MG tablet TAKE 1 TABLET EVERY DAY 90 tablet 1  . TRUE METRIX BLOOD GLUCOSE TEST test strip CHECK BLOOD SUGAR EVERY DAY 100 strip 1  . TRUEplus Lancets 33G MISC Check BS QD - dx: e11.9 100 each 3   No current facility-administered medications on file prior to visit.   Allergies  Allergen Reactions  . Jardiance [Empagliflozin]     Severe nausea-    Social History   Socioeconomic History  .  Marital status: Married    Spouse name: Not on file  . Number of children: Not on file  . Years of education: Not on file  . Highest education level: Not on file  Occupational History  . Occupation: retired    [employer] farm manager RETIRED    Comment: [employer]   Tobacco Use  . Smoking status: Former    Years: 35.00    Types: Cigarettes    Quit date: 01/02/1988    Years since quitting: 32.9  . Smokeless tobacco: Never  Vaping Use  . Vaping Use: Never used  Substance and Sexual Activity  . Alcohol use: Not Currently    Comment: rare beer, no history of ETOH abuse  . Drug use: No  . Sexual activity: Not on file  Other Topics Concern  . Not on file  Social History Narrative  . Not on file   Social Determinants of Health   Financial Resource Strain: Not on file  Food Insecurity: Not on file  Transportation Needs: No Transportation Needs  . Lack of Transportation (Medical): No  . Lack of Transportation (Non-Medical): No  Physical Activity: Not on file  Stress: Not on file  Social Connections: Not on file  Intimate Partner Violence: Not on file   Family History  Problem Relation Age of Onset  . Diabetes Mother   . Heart disease Mother        After age 82 . Heart attack Mother   . Hypertension Mother   . Diabetes Father   . Heart disease Father        After age 82 . Hypertension Father   . Heart attack Father   .  Brain cancer Sister   . Cancer Sister        Brain  . Diabetes Sister   . Hypertension Sister   . Cancer Brother        Chest Tumor  . Diabetes Brother   . Hypertension Brother   . Deep vein thrombosis Sister   . Diabetes Sister   . Diabetes Brother        Bilateral leg  . Colon cancer Neg Hx       Review of Systems  All other systems reviewed and are negative.     Objective:   Physical Exam Vitals reviewed.  Constitutional:      General: He is not in acute distress.    Appearance: Normal appearance. He is well-developed. He is obese. He is not  ill-appearing, toxic-appearing or diaphoretic.  HENT:     Head: Normocephalic and atraumatic.     Right Ear: External ear normal.     Left Ear: External ear normal.     Nose: Nose normal.     Mouth/Throat:     Pharynx: No oropharyngeal exudate.  Eyes:     General: No scleral icterus.       Right eye: No discharge.        Left eye: No discharge.     Conjunctiva/sclera: Conjunctivae normal.     Pupils: Pupils are equal, round, and reactive to light.  Neck:     Thyroid: No thyromegaly.     Vascular: No carotid bruit or JVD.     Trachea: No tracheal deviation.  Cardiovascular:     Rate and Rhythm: Normal rate. Rhythm irregular.     Heart sounds: Murmur heard.    No friction rub. No gallop.  Pulmonary:     Effort: Pulmonary effort is normal. No respiratory distress.     Breath sounds: Normal breath sounds. No stridor. No wheezing or rales.  Chest:     Chest wall: No tenderness.  Abdominal:     General: Bowel sounds are normal. There is no distension.     Palpations: Abdomen is soft. There is no mass.     Tenderness: There is no abdominal tenderness. There is no guarding or rebound.  Musculoskeletal:        General: No tenderness. Normal range of motion.     Cervical back: Normal range of motion and neck supple.     Right lower leg: Edema present.     Left lower leg: Edema present.  Lymphadenopathy:     Cervical: No cervical adenopathy.  Skin:    General: Skin is warm.     Coloration: Skin is not jaundiced or pale.     Findings: No bruising, erythema, lesion or rash.  Neurological:     Mental Status: He is alert and oriented to person, place, and time.     Cranial Nerves: No cranial nerve deficit.     Motor: No abnormal muscle tone.     Coordination: Coordination normal.     Deep Tendon Reflexes: Reflexes are normal and symmetric.  Psychiatric:        Behavior: Behavior normal.        Thought Content: Thought content normal.        Judgment: Judgment normal.           Assessment & Plan:  Irregular heart beat - Plan: EKG 12-Lead  Controlled type 2 diabetes mellitus without complication, without long-term current use of insulin (HCC)  Essential hypertension  Coronary artery disease  involving native coronary artery of native heart without angina pectoris  Stage 3b chronic kidney disease ([place])  General medical exam AAA is being followed by vascular surgery.  Last ultrasound was in February and he has an appointment scheduled to meet with them soon.  Diabetes and cholesterol are well controlled.  Blood pressure today is outstanding 128/68.  Patient has stable stage IIIb chronic kidney disease as well as stable anemia of chronic disease.  He denies any falls or depression or memory loss.  Recommended Shingrix, the flu shot, and a COVID booster.  EKG today shows normal sinus rhythm with frequent PVCs but no evidence of atrial fibrillation.

## 2020-12-24 ENCOUNTER — Other Ambulatory Visit: Payer: Self-pay

## 2020-12-24 ENCOUNTER — Ambulatory Visit (INDEPENDENT_AMBULATORY_CARE_PROVIDER_SITE_OTHER): Payer: Medicare HMO | Admitting: *Deleted

## 2020-12-24 DIAGNOSIS — Z23 Encounter for immunization: Secondary | ICD-10-CM | POA: Diagnosis not present

## 2020-12-24 DIAGNOSIS — E119 Type 2 diabetes mellitus without complications: Secondary | ICD-10-CM

## 2020-12-24 DIAGNOSIS — I10 Essential (primary) hypertension: Secondary | ICD-10-CM

## 2020-12-24 NOTE — Patient Instructions (Signed)
Visit Information  PATIENT GOALS:  Goals Addressed             This Visit's Progress    Monitor and Manage My Blood Sugar-Diabetes Type 2       Timeframe:  Long-Range Goal Priority:  Medium Start Date:    09/17/2020                         Expected End Date:    03/20/2021                   Follow Up Date- 03/11/2021   - check blood sugar at prescribed times and enter into log, take log and glucometer to all doctors visits - check blood sugar if I feel it is too high or too low - continue to watch carbohydrate intake, foods such as pasta, potatoes, bread can elevate blood sugar - avoid/ limit concentrated sweets such as cakes, pies, cookies - please review education sent via My Chart - foot care - try to do some type of exercise daily, get outside daily if you can   Why is this important?   Checking your blood sugar at home helps to keep it from getting very high or very low.  Writing the results in a diary or log helps the doctor know how to care for you.  Your blood sugar log should have the time, date and the results.  Also, write down the amount of insulin or other medicine that you take.  Other information, like what you ate, exercise done and how you were feeling, will also be helpful.     Notes:      Track and Manage My Blood Pressure-Hypertension       Timeframe:  Long-Range Goal Priority:  High Start Date:           09/17/2020                  Expected End Date:   03/20/2021                    Follow Up Date- 03/11/2021   - continue to check blood pressure daily and record in log, take log to doctor's appointments - follow a low salt diet- avoid salty snacks and fast food read labels - try to exercise daily, continue going to [employer]- keep up the good work - take all medications as prescribed - follow up with Dr. Scot Dock on 10/20   Why is this important?   You won't feel high blood pressure, but it can still hurt your blood vessels.  High blood pressure can  cause heart or kidney problems. It can also cause a stroke.  Making lifestyle changes like losing a little weight or eating less salt will help.  Checking your blood pressure at home and at different times of the day can help to control blood pressure.  If the doctor prescribes medicine remember to take it the way the doctor ordered.  Call the office if you cannot afford the medicine or if there are questions about it.     Notes:         Patient verbalizes understanding of instructions provided today and agrees to view in [place].   Telephone follow up appointment with care management team member scheduled for:   03/11/2021  Foot Care, Adult Foot care is an important part of your health. Finding and addressing potential problems early is the best  way to prevent future foot problems. Older adults and those with chronic health conditions, such as diabetes, are more likely to develop foot problems. Supplies needed: Warm water. Mild soap. Clean towel. Nail clippers. [place] board or [employer]. Unscented moisturizing lotion or petroleum jelly. Mirror. How to care for your feet Foot hygiene Wash your feet daily with warm (not hot) water and mild soap. Then use a clean towel to dry your feet and the areas between your toes. Do not soak your feet as this can dry your skin. Clip your toenails straight across. They are easier to cut after bathing when nails are slightly softer. Do not dig under them or around the cuticle. File the edges of your nails with an emery board or nail file. Apply unscented moisturizing lotion or petroleum jelly without alcohol to your feet and dry toenails. Do not apply lotion between your toes. Shoes and socks Wear clean socks or stockings every day. Make sure they are not too tight. Wear shoes that fit and have enough cushioning. To break in new shoes, wear them for a few hours a day. This prevents you from injuring your feet. Always look in your shoes before putting  them on to be sure there are no objects inside. Wounds, scrapes, corns, and calluses  Check your feet daily for blisters, cuts, and redness. If you cannot see the bottom of your feet, use a mirror or ask someone for help. Do not cut corns or calluses. Do not try to remove them with medicine. If you find a minor scrape, cut, or break in the skin on your feet, keep the area clean and dry. These areas may be cleaned with mild soap and water. Do not clean the area with peroxide, alcohol, or iodine. If you have a wound, scrape, corn, or callus on your foot, look at it several times a day to make sure it is healing and not infected. Check for: More redness, swelling, or pain. Fluid or blood. Warmth. Pus or a bad smell. General instructions Do not cross your legs. That may decrease the blood flow to your feet. Do not use heating pads or hot water bottles on your feet. They may burn your skin. If you have lost feeling in your feet or legs, you may not know it is happening until it is too late. Make sure your health care provider does a complete foot exam at least once a year, or more often if you have foot problems. If you have foot problems, report any cuts, sores, or bruises to your health care provider right away. Where to find more information Centers for Disease Control and Prevention: http://www.wolf.info/ Contact a health care provider if: You have a medical condition that increases your risk of infection and you have any cuts, sores, or bruises on your feet. You have an injury that is not healing. You notice redness on your legs or feet. You feel burning or tingling in your legs or feet. You have pain or cramps in your legs or feet. Your legs or feet are numb. Your feet always feel cold. You have pain around a toenail. Get help right away if: You have a wound, scrape, corn, or callus on your foot and: You have more redness, swelling, or pain. You have fluid or blood. Your wound, scrape, corn, or  callus feels warm to the touch. You have pus or a bad smell coming from the wound, scrape, corn, or callus. You have a fever. You have a  red line going up your leg. Summary Foot care is an important part of your health. Finding and addressing potential problems early is the best way to prevent future foot problems. Wash your feet daily with warm (not hot) water and mild soap. Then, use a clean towel to dry your feet and the areas between your toes. Do not soak your feet as this can dry your skin. Clip your toenails straight across. Wear clean socks or stockings every day. Check your feet daily for blisters, cuts, and redness. If you cannot see the bottom of your feet, use a mirror or ask someone for help. This information is not intended to replace advice given to you by your health care provider. Make sure you discuss any questions you have with your health care provider. Document Revised: 01/14/2019 Document Reviewed: 01/14/2019 Elsevier Patient Education  2022 [place]   Jacqlyn Larsen [facility], BSN RN Case Manager [facility] [phone]

## 2020-12-24 NOTE — Chronic Care Management (AMB) (Signed)
Chronic Care Management   CCM RN Visit Note  12/24/2020 Name: Juan [name] MRN: [id number] DOB: [birth year]  Subjective: Juan [name] is a 80 y.o. year old male who is a primary care patient of Pickard, Cammie Mcgee, MD. The care management team was consulted for assistance with disease management and care coordination needs.    Engaged with patient by telephone for follow up visit in response to provider referral for case management and/or care coordination services.   Consent to Services:  The patient was given information about Chronic Care Management services, agreed to services, and gave verbal consent prior to initiation of services.  Please see initial visit note for detailed documentation.   Patient agreed to services and verbal consent obtained.   Assessment: Review of patient past medical history, allergies, medications, health status, including review of consultants reports, laboratory and other test data, was performed as part of comprehensive evaluation and provision of chronic care management services.   SDOH (Social Determinants of Health) assessments and interventions performed:    CCM Care Plan  Allergies  Allergen Reactions   Jardiance [Empagliflozin]     Severe nausea-     Outpatient Encounter Medications as of 12/24/2020  Medication Sig   allopurinol (ZYLOPRIM) 300 MG tablet TAKE 1 TABLET EVERY DAY   aspirin EC 81 MG tablet Take 81 mg by mouth daily.   benzonatate (TESSALON PERLES) 100 MG capsule Take 1 capsule (100 mg total) by mouth every 6 (six) hours as needed for cough.   Blood Glucose Monitoring Suppl (TRUE METRIX METER) w/Device KIT Use as Directed   diltiazem (CARDIZEM CD) 240 MG 24 hr capsule TAKE 1 CAPSULE EVERY DAY   doxazosin (CARDURA) 8 MG tablet Take 1 tablet (8 mg total) by mouth daily.   Dulaglutide (TRULICITY) 1.5 PY/1.9JK SOPN Inject 1.5 mg into the skin once a week. Begin after 0.53m completed.   furosemide (LASIX) 40 MG tablet Take 1  tablet (40 mg total) by mouth daily.   glipiZIDE (GLUCOTROL) 5 MG tablet TAKE 1 TABLET TWICE DAILY BEFORE MEALS   ketorolac (ACULAR) 0.5 % ophthalmic solution Place 1 drop into the left eye 2 (two) times daily.   Lancet Devices (PRODIGY LANCING DEVICE) MISC Use bid DX e11.9   lisinopril (ZESTRIL) 40 MG tablet TAKE 1 TABLET EVERY DAY   metoprolol succinate (TOPROL-XL) 25 MG 24 hr tablet TAKE 1 TABLET EVERY DAY   Multiple Vitamin (MULTIVITAMIN WITH MINERALS) TABS Take 1 tablet by mouth daily.   Omega-3 Fatty Acids (FISH OIL PO) Take by mouth.   pantoprazole (PROTONIX) 20 MG tablet TAKE 1 TABLET EVERY DAY   potassium chloride SA (KLOR-CON) 20 MEQ tablet Take 1 tablet (20 mEq total) by mouth daily.   rOPINIRole (REQUIP) 0.5 MG tablet Take 1 tablet (0.5 mg total) by mouth at bedtime.   rosuvastatin (CRESTOR) 20 MG tablet TAKE 1 TABLET EVERY DAY   TRUE METRIX BLOOD GLUCOSE TEST test strip CHECK BLOOD SUGAR EVERY DAY   TRUEplus Lancets 33G MISC Check BS QD - dx: e11.9   No facility-administered encounter medications on file as of 12/24/2020.    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   OSA (obstructive sleep apnea) 09/18/2019   Cardiac arrhythmia 08/21/2019   Cystoid macular edema  of right eye 07/14/2019   Vitreomacular adhesion of left eye 07/14/2019   Cystoid macular edema of left eye 07/14/2019   Vitreomacular adhesion of both eyes 07/14/2019   Retinopathy due to secondary diabetes mellitus, with macular edema, 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[place]) 07/03/2019   CAD S/P percutaneous coronary angioplasty 07/03/2019   CKD (chronic kidney disease) stage 3, GFR 30-59 ml/min (HCC)    Hypertension 05/09/2017   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    PAD (peripheral artery disease) ([place])    Personal history of colonic polyps 09/09/2012   Diabetes mellitus without complication (HCC)    Hyperlipidemia    Neuromuscular disorder ([place])    CAD (coronary artery disease)    AAA (abdominal aortic aneurysm) 01/02/2012    Conditions to be addressed/monitored:HTN and DMII  Care Plan : Diabetes Type 2 (Adult)  Updates made by Kassie Mends, RN since 12/24/2020 12:00 AM     Problem: Glycemic Management (Diabetes, Type 2)   Priority: Medium     Long-Range Goal: Glycemic Management Optimized   Start Date: 09/17/2020  Expected End Date: 03/20/2021  This Visit's Progress: On track  Recent Progress: On track  Priority: Medium  Note:   Objective:  Lab Results  Component Value Date   HGBA1C 6.8 (H) 05/25/2020   Lab Results  Component Value Date   CREATININE 1.56 (H) 05/14/2020   CREATININE 1.63 (H) 12/01/2019   CREATININE 2.27 (H) 11/04/2019   No results found for: EGFR Current Barriers:  Knowledge Deficits related to basic Diabetes pathophysiology and self care/management- does not adhere to ADA or any specific diet, pt reports he lives with spouse and has sister that he can call on if needed.  Pt reports he is independent in all aspects of his care, continues to drive, reports no issues with medications and states he is already getting assistance for one of his medications.  Pt reports he checks CBG once daily with readings 100-130. Case Manager Clinical Goal(s):  patient will demonstrate improved adherence to prescribed treatment plan for diabetes self care/management as evidenced by: daily monitoring and recording of CBG  adherence to ADA/ carb modified diet adherence to prescribed medication regimen contacting provider for new or worsened symptoms or questions Interventions:  Collaboration with Susy Frizzle, MD regarding development and update of comprehensive plan of care as evidenced by provider  attestation and co-signature Inter-disciplinary care team collaboration (see longitudinal plan of care) Reviewed medications with patient and discussed importance of medication adherence Reviewed plans with patient for ongoing care management follow up and provided patient with direct contact information for care management team Reviewed scheduled/upcoming provider appointments including Dr. Scot Dock 12/30/20 Reviewed of patient status, including review of consultants reports, relevant laboratory and other test results, and medications completed. Reviewed importance of following ADA diet and being mindful of carbohydrate intake Sent diabetes diet education via Foot care Reviewed importance of exercising daily Self-Care Activities  Attends all scheduled provider appointments Checks blood sugars as prescribed and utilize hyper and hypoglycemia protocol as needed Adheres to prescribed ADA/carb modified Patient Goals: - manage portion size - check feet daily for cuts, sores or redness - wash and dry feet carefully every day - wear comfortable, cotton socks - wear comfortable, well-fitting shoes - check blood sugar at prescribed times and enter into log, take log and glucometer to all doctors visits - check blood sugar if I feel it is too high or too low - continue to watch carbohydrate intake,  foods such as pasta, potatoes, bread can elevate blood sugar - avoid/ limit concentrated sweets such as cakes, pies, cookies - please review education sent via My Chart - foot care - try to do some type of exercise daily, get outside daily if you can Follow Up Plan: Telephone follow up appointment with care management team member scheduled for:   03/11/2021    Care Plan : Hypertension (Adult)  Updates made by Kassie Mends, RN since 12/24/2020 12:00 AM     Problem: Hypertension (Hypertension)   Priority: High     Long-Range Goal: Hypertension Monitored   Start Date: 09/17/2020  Expected End  Date: 03/20/2021  This Visit's Progress: On track  Recent Progress: On track  Priority: High  Note:   Objective:  Last practice recorded BP readings:  BP Readings from Last 3 Encounters:  05/25/20 (!) 162/68  05/18/20 (!) 162/82  05/05/20 (!) 171/77   Most recent eGFR/CrCl: No results found for: EGFR  No components found for: CRCL Current Barriers:  Knowledge Deficits related to basic understanding of hypertension pathophysiology and self care management- pt does not adhere to any specific diet Does not adhere to provider recommendations re:  Patient reports he is checking blood pressure daily and has had no systolic readings above 235 and no diastolic readings above 90.  Pt reports he has all medications and takes as prescribed. Case Manager Clinical Goal(s):  patient will verbalize understanding of plan for hypertension management patient will attend all scheduled medical appointments: upcoming annual wellness visit due patient will demonstrate improved adherence to prescribed treatment plan for hypertension as evidenced by taking all medications as prescribed, monitoring and recording blood pressure as directed, adhering to low sodium/DASH diet Interventions:  Collaboration with Susy Frizzle, MD regarding development and update of comprehensive plan of care as evidenced by provider attestation and co-signature Inter-disciplinary care team collaboration (see longitudinal plan of care) Evaluation of current treatment plan related to hypertension self management and patient's adherence to plan as established by provider. Reinforced education to patient re: stroke prevention, s/s of heart attack and stroke, DASH diet, complications of uncontrolled blood pressure Reviewed medications with patient and discussed importance of compliance Reinforced plans with patient for ongoing care management follow up and provided patient with direct contact information for care management team Advised  patient, providing education and rationale, to monitor blood pressure at least 3 times per week and record, calling PCP for findings outside established parameters.  Reviewed scheduled/upcoming provider appointments including: Dr. Scot Dock 10/20 Reviewed importance of adherence to low sodium diet  Read labels for sodium content Reviewed importance of exercising and continuing to attend [place], get outside daily Self-Care Activities: Attends all scheduled provider appointments Calls provider office for new concerns, questions, or BP outside discussed parameters Checks BP and records as discussed Follows a low sodium diet/DASH diet Patient Goals: - continue to check blood pressure daily and record in log, take log to doctor's appointments - follow a low salt diet- avoid salty snacks and fast food read labels - try to exercise daily, continue going to [facility]- keep up the good work - take all medications as prescribed - follow up with Dr. Scot Dock on 10/20 Follow Up Plan: Telephone follow up appointment with care management team member scheduled for:   03/11/2021     Plan:Telephone follow up appointment with care management team member scheduled for: 03/11/2021  Jacqlyn Larsen [facility], BSN RN Case Manager [facility] [phone]

## 2020-12-30 ENCOUNTER — Other Ambulatory Visit: Payer: Self-pay

## 2020-12-30 ENCOUNTER — Ambulatory Visit: Payer: Medicare HMO | Admitting: Vascular Surgery

## 2020-12-30 ENCOUNTER — Ambulatory Visit (INDEPENDENT_AMBULATORY_CARE_PROVIDER_SITE_OTHER)
Admission: RE | Admit: 2020-12-30 | Discharge: 2020-12-30 | Disposition: A | Discharge status: Home / Self Care | Payer: Medicare HMO | Source: Ambulatory Visit | Attending: Vascular Surgery | Admitting: Vascular Surgery

## 2020-12-30 ENCOUNTER — Encounter: Payer: Self-pay | Admitting: Vascular Surgery

## 2020-12-30 ENCOUNTER — Ambulatory Visit (HOSPITAL_COMMUNITY)
Admission: RE | Admit: 2020-12-30 | Discharge: 2020-12-30 | Disposition: A | Discharge status: Home / Self Care | Payer: Medicare HMO | Source: Ambulatory Visit | Attending: Vascular Surgery | Admitting: Vascular Surgery

## 2020-12-30 VITALS — BP 153/68 | HR 71 | Temp 98.0°F | Resp 20 | Wt 248.0 lb

## 2020-12-30 DIAGNOSIS — I6521 Occlusion and stenosis of right carotid artery: Secondary | ICD-10-CM

## 2020-12-30 DIAGNOSIS — I739 Peripheral vascular disease, unspecified: Secondary | ICD-10-CM | POA: Diagnosis not present

## 2020-12-30 DIAGNOSIS — I714 Abdominal aortic aneurysm, without rupture, unspecified: Secondary | ICD-10-CM | POA: Insufficient documentation

## 2020-12-30 NOTE — Progress Notes (Signed)
REASON FOR VISIT:     MEDICAL ISSUES:   5 CM INFRARENAL ABDOMINAL AORTIC ANEURYSM: This patient's last duplex scan in February showed an abdominal aortic aneurysm that measured 4.98 cm in maximum diameter.  The aneurysm could not be visualized today so I have ordered a CT angiogram of the abdomen and pelvis.  He would like this done in [place] and I will do a phone visit after this CT scan.  He understands we would not consider elective repair unless the aneurysm reached 5.5 cm in maximum diameter.  RIGHT CAROTID BRUIT: When he was seen last he was noted to have a right carotid bruit.  Therefore carotid duplex scan was arranged for today and this shows no evidence of significant carotid disease bilaterally as documented below.  PERIPHERAL ARTERIAL DISEASE: He does have some evidence of aortoiliac occlusive disease although again it was a limited study because of his size and overlying bowel gas.  However his ABIs are preserved and he is essentially asymptomatic.  HPI:   Juan [name] is a pleasant 80 y.o. male who was seen by Liana Crocker, [place] on 05/05/2020.  At that time he had an abdominal aortic aneurysm that measured 4.98 cm in maximum diameter.  He was set up for a follow-up study today and put on my schedule.  He denies any significant abdominal pain or back pain.  I do not get any history of claudication, rest pain, or nonhealing ulcers.  He denies any previous history of stroke, TIAs, expressive or receptive aphasia, or amaurosis fugax.  His risk factors for peripheral vascular disease include diabetes, hypertension, hyperlipidemia, and a remote history of tobacco use.  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    50-69% (12/2015)   Neuromuscular disorder ([place])    L2-3,L5-S1 bulging disc /nerve impingement   PAD (peripheral artery disease) ([place])     Family History  Problem Relation Age of Onset   Diabetes Mother    Heart disease Mother        After age 22   Heart attack Mother    Hypertension Mother    Diabetes Father    Heart disease Father        After age 41   Hypertension Father    Heart attack Father    Brain cancer Sister    Cancer Sister        Brain   Diabetes Sister    Hypertension Sister    Cancer Brother        Chest Tumor   Diabetes Brother    Hypertension Brother    Deep vein thrombosis Sister    Diabetes Sister    Diabetes Brother        Bilateral leg   Colon cancer Neg Hx     SOCIAL HISTORY: Social History   Tobacco Use   Smoking status: Former    Years: 35.00    Types: Cigarettes    Quit date: 01/02/1988    Years since quitting: 33.0   Smokeless tobacco: Never  Substance Use Topics   Alcohol use:  Not Currently    Comment: rare beer, no history of ETOH abuse    Allergies  Allergen Reactions   Jardiance [Empagliflozin]     Severe nausea-     Current Outpatient Medications  Medication Sig Dispense Refill   allopurinol (ZYLOPRIM) 300 MG tablet TAKE 1 TABLET EVERY DAY 90 tablet 3   aspirin EC 81 MG tablet Take 81 mg by mouth daily.     benzonatate (TESSALON PERLES) 100 MG capsule Take 1 capsule (100 mg total) by mouth every 6 (six) hours as needed for cough. 30 capsule 0   Blood Glucose Monitoring Suppl (TRUE METRIX METER) w/Device KIT Use as Directed 1 kit 1   diltiazem (CARDIZEM CD) 240 MG 24 hr capsule TAKE 1 CAPSULE EVERY DAY 90 capsule 1   doxazosin (CARDURA) 8 MG tablet Take 1 tablet (8 mg total) by mouth daily. 90 tablet 1   Dulaglutide (TRULICITY) 1.5 YK/9.9IP SOPN Inject 1.5 mg into the skin once a week. Begin after 0.76m completed. 4 mL 11    furosemide (LASIX) 40 MG tablet Take 1 tablet (40 mg total) by mouth daily. 30 tablet 3   glipiZIDE (GLUCOTROL) 5 MG tablet TAKE 1 TABLET TWICE DAILY BEFORE MEALS 180 tablet 2   ketorolac (ACULAR) 0.5 % ophthalmic solution Place 1 drop into the left eye 2 (two) times daily. 5 mL 4   Lancet Devices (PRODIGY LANCING DEVICE) MISC Use bid DX e11.9 1 each 2   lisinopril (ZESTRIL) 40 MG tablet TAKE 1 TABLET EVERY DAY 90 tablet 3   metoprolol succinate (TOPROL-XL) 25 MG 24 hr tablet TAKE 1 TABLET EVERY DAY 90 tablet 3   Multiple Vitamin (MULTIVITAMIN WITH MINERALS) TABS Take 1 tablet by mouth daily.     Omega-3 Fatty Acids (FISH OIL PO) Take by mouth.     pantoprazole (PROTONIX) 20 MG tablet TAKE 1 TABLET EVERY DAY 90 tablet 3   potassium chloride SA (KLOR-CON) 20 MEQ tablet Take 1 tablet (20 mEq total) by mouth daily. 90 tablet 3   rOPINIRole (REQUIP) 0.5 MG tablet Take 1 tablet (0.5 mg total) by mouth at bedtime. 30 tablet 3   rosuvastatin (CRESTOR) 20 MG tablet TAKE 1 TABLET EVERY DAY 90 tablet 1   TRUE METRIX BLOOD GLUCOSE TEST test strip CHECK BLOOD SUGAR EVERY DAY 100 strip 1   TRUEplus Lancets 33G MISC Check BS QD - dx: e11.9 100 each 3   No current facility-administered medications for this visit.    REVIEW OF SYSTEMS:  '[X]'  denotes positive finding, '[ ]'  denotes negative finding Cardiac  Comments:  Chest pain or chest pressure:    Shortness of breath upon exertion:    Short of breath when lying flat:    Irregular heart rhythm:        Vascular    Pain in calf, thigh, or hip brought on by ambulation:    Pain in feet at night that wakes you up from your sleep:     Blood clot in your veins:    Leg swelling:         Pulmonary    Oxygen at home:    Productive cough:     Wheezing:         Neurologic    Sudden weakness in arms or legs:     Sudden numbness in arms or legs:     Sudden onset of difficulty speaking or slurred speech:    Temporary loss of vision in one eye:  Problems with dizziness:         Gastrointestinal    Blood in stool:     Vomited blood:         Genitourinary    Burning when urinating:     Blood in urine:        Psychiatric    Major depression:         Hematologic    Bleeding problems:    Problems with blood clotting too easily:        Skin    Rashes or ulcers:        Constitutional    Fever or chills:     PHYSICAL EXAM:   Vitals:   12/30/20 1057 12/30/20 1100  BP: (!) 144/57 (!) 153/68  Pulse: 71   Resp: 20   Temp: 98 F (36.7 C)   SpO2: 98%   Weight: 248 lb (112.5 kg)    Body mass index is 35.58 kg/m.  GENERAL: The patient is a well-nourished male, in no acute distress. The vital signs are documented above. CARDIAC: There is a regular rate and rhythm.  VASCULAR: He has a soft right carotid bruit. He has palpable femoral pulses.  I cannot palpate pedal pulses. PULMONARY: There is good air exchange bilaterally without wheezing or rales. ABDOMEN: Soft and non-tender with normal pitched bowel sounds.  I cannot assess his aneurysm because of his body habitus. MUSCULOSKELETAL: There are no major deformities or cyanosis. NEUROLOGIC: No focal weakness or paresthesias are detected. SKIN: There are no ulcers or rashes noted. PSYCHIATRIC: The patient has a normal affect.  DATA:    ARTERIAL DOPPLER STUDY: I have independently interpreted his arterial Doppler study today.  On the right side there is a triphasic dorsalis pedis and posterior tibial signal.  ABI is 99%.  Toe pressures 142 mmHg.  On the left side there is a triphasic dorsalis pedis and posterior tibial signal.  ABI is 100%.  Toe pressures 146 mmHg.  CAROTID DUPLEX: I have independently interpreted his carotid duplex scan today.  On the right side there is a less than 39% stenosis.  The right vertebral artery is patent with antegrade flow.  On the left side there is a less than 39% stenosis.  The left vertebral artery is patent with antegrade  flow.  AORTOILIAC DUPLEX: I have independently interpreted his aortoiliac duplex scan today.  This was a limited study today as there was significant bowel gas and the patient was uncomfortable in addition to having some cardiac arrhythmia.  However based on this suboptimal exam with limited visualization there is likely some stenosis noted in bilateral common iliac arteries and external iliac arteries based on elevated velocities.  Because of his size and abdominal gas the abdominal aorta was difficult to visualize.   Deitra Mayo [facility] [phone]

## 2020-12-31 ENCOUNTER — Other Ambulatory Visit: Payer: Self-pay

## 2020-12-31 DIAGNOSIS — I739 Peripheral vascular disease, unspecified: Secondary | ICD-10-CM

## 2020-12-31 DIAGNOSIS — I714 Abdominal aortic aneurysm, without rupture, unspecified: Secondary | ICD-10-CM

## 2021-01-04 ENCOUNTER — Other Ambulatory Visit: Payer: Self-pay | Admitting: Family Medicine

## 2021-01-10 DIAGNOSIS — E119 Type 2 diabetes mellitus without complications: Secondary | ICD-10-CM | POA: Diagnosis not present

## 2021-01-10 DIAGNOSIS — I10 Essential (primary) hypertension: Secondary | ICD-10-CM

## 2021-01-11 ENCOUNTER — Encounter (HOSPITAL_BASED_OUTPATIENT_CLINIC_OR_DEPARTMENT_OTHER): Payer: Self-pay

## 2021-01-11 ENCOUNTER — Other Ambulatory Visit: Payer: Self-pay

## 2021-01-11 ENCOUNTER — Ambulatory Visit (HOSPITAL_BASED_OUTPATIENT_CLINIC_OR_DEPARTMENT_OTHER)
Admission: RE | Admit: 2021-01-11 | Discharge: 2021-01-11 | Disposition: A | Discharge status: Home / Self Care | Payer: Medicare HMO | Source: Ambulatory Visit | Attending: Vascular Surgery | Admitting: Vascular Surgery

## 2021-01-11 DIAGNOSIS — I714 Abdominal aortic aneurysm, without rupture, unspecified: Secondary | ICD-10-CM | POA: Diagnosis not present

## 2021-01-11 DIAGNOSIS — K760 Fatty (change of) liver, not elsewhere classified: Secondary | ICD-10-CM | POA: Diagnosis not present

## 2021-01-11 DIAGNOSIS — I7143 Infrarenal abdominal aortic aneurysm, without rupture: Secondary | ICD-10-CM | POA: Diagnosis not present

## 2021-01-11 DIAGNOSIS — I739 Peripheral vascular disease, unspecified: Secondary | ICD-10-CM | POA: Insufficient documentation

## 2021-01-11 DIAGNOSIS — N281 Cyst of kidney, acquired: Secondary | ICD-10-CM | POA: Diagnosis not present

## 2021-01-11 DIAGNOSIS — K5730 Diverticulosis of large intestine without perforation or abscess without bleeding: Secondary | ICD-10-CM | POA: Diagnosis not present

## 2021-01-11 LAB — POCT I-STAT CREATININE: Creatinine, Ser: 1.5 mg/dL — ABNORMAL HIGH (ref 0.61–1.24)

## 2021-01-11 MED ORDER — IOHEXOL 350 MG/ML SOLN
80.0000 mL | Freq: Once | INTRAVENOUS | Status: AC | PRN
  Administered 2021-01-11: 75 mL via INTRAVENOUS

## 2021-01-27 ENCOUNTER — Other Ambulatory Visit: Payer: Self-pay

## 2021-01-27 ENCOUNTER — Ambulatory Visit (INDEPENDENT_AMBULATORY_CARE_PROVIDER_SITE_OTHER): Payer: Medicare HMO | Admitting: Vascular Surgery

## 2021-01-27 ENCOUNTER — Encounter: Payer: Self-pay | Admitting: Vascular Surgery

## 2021-01-27 DIAGNOSIS — I714 Abdominal aortic aneurysm, without rupture, unspecified: Secondary | ICD-10-CM | POA: Diagnosis not present

## 2021-01-27 NOTE — Progress Notes (Signed)
Patient name: Juan [name] DOB: 03-02-1941 Sex: male    Referring Provider is Susy Frizzle, MD  PCP is Susy Frizzle, MD  REASON FOR VIRTUAL VISIT: To discuss results of CT angiogram  I connected with Juan [name] on 01/27/21 at  4:00 PM EST by a telephone visit and verified that I am speaking with the correct person using two identifiers. I discussed the limitations of evaluation and management by telemedicine and the availability of in person appointments. The patient expressed understanding and agreed to proceed.  Location: Patient: Home Provider: Office  HPI: Juan [name] is a 80 y.o. male who I saw on 12/30/2020 for follow-up of his abdominal aortic aneurysm.  Duplex scan in February showed that the maximum diameter of his aneurysm was 5 cm.  He came in for an ultrasound but the aorta could not be adequately visualized.  Therefore I recommended a CT scan which she requested be done in [place].  He had that study done on 01/11/2021 and we set up a phone visit to discuss those results.  Of note, when I saw him last I did detect a right carotid bruit but carotid duplex scan did not show any significant carotid disease bilaterally and he was asymptomatic.  He tells me that he does have chronic back pain but has had no new onset abdominal or back pain.  He is not a smoker.  His blood pressure has been under good control although slightly higher recently.  He denies any postprandial abdominal pain and has had no weight loss.  Current Outpatient Medications  Medication Sig Dispense Refill   allopurinol (ZYLOPRIM) 300 MG tablet TAKE 1 TABLET EVERY DAY 90 tablet 3   aspirin EC 81 MG tablet Take 81 mg by mouth daily.     benzonatate (TESSALON PERLES) 100 MG capsule Take 1 capsule (100 mg total) by mouth every 6 (six) hours as needed for cough. 30 capsule 0   Blood Glucose Monitoring Suppl (TRUE METRIX METER) w/Device KIT Use as Directed 1 kit 1   diltiazem (CARDIZEM CD) 240  MG 24 hr capsule TAKE 1 CAPSULE EVERY DAY 90 capsule 1   doxazosin (CARDURA) 8 MG tablet TAKE 1 TABLET EVERY DAY 90 tablet 1   Dulaglutide (TRULICITY) 1.5 RP/5.9YV SOPN Inject 1.5 mg into the skin once a week. Begin after 0.25m completed. 4 mL 11   furosemide (LASIX) 40 MG tablet Take 1 tablet (40 mg total) by mouth daily. 30 tablet 3   glipiZIDE (GLUCOTROL) 5 MG tablet TAKE 1 TABLET TWICE DAILY BEFORE MEALS 180 tablet 2   ketorolac (ACULAR) 0.5 % ophthalmic solution Place 1 drop into the left eye 2 (two) times daily. 5 mL 4   Lancet Devices (PRODIGY LANCING DEVICE) MISC Use bid DX e11.9 1 each 2   lisinopril (ZESTRIL) 40 MG tablet TAKE 1 TABLET EVERY DAY 90 tablet 3   metoprolol succinate (TOPROL-XL) 25 MG 24 hr tablet TAKE 1 TABLET EVERY DAY 90 tablet 3   Multiple Vitamin (MULTIVITAMIN WITH MINERALS) TABS Take 1 tablet by mouth daily.     Omega-3 Fatty Acids (FISH OIL PO) Take by mouth.     pantoprazole (PROTONIX) 20 MG tablet TAKE 1 TABLET EVERY DAY 90 tablet 3   potassium chloride SA (KLOR-CON) 20 MEQ tablet Take 1 tablet (20 mEq total) by mouth daily. 90 tablet 3   rOPINIRole (REQUIP) 0.5 MG tablet Take 1 tablet (0.5 mg total) by mouth at bedtime. 30 tablet 3  rosuvastatin (CRESTOR) 20 MG tablet TAKE 1 TABLET EVERY DAY 90 tablet 1   TRUE METRIX BLOOD GLUCOSE TEST test strip CHECK BLOOD SUGAR EVERY DAY 100 strip 1   TRUEplus Lancets 33G MISC Check BS QD - dx: e11.9 100 each 3   No current facility-administered medications for this visit.   REVIEW OF SYSTEMS: Valu.Nieves ] denotes positive finding; [  ] denotes negative finding  CARDIOVASCULAR:  _0  chest pain   _1  dyspnea on exertion  _2  leg swelling  CONSTITUTIONAL:  _3  fever   _4  chills   OBSERVATIONS/OBJECTIVE: There were no vitals filed for this visit. The patient is not short of breath on the phone.   He is alert and oriented.  DATA: I have reviewed his CT angiogram that was done on 01/11/2021.  The maximum diameter of his aneurysm  is 5.1 cm.  Thus this is not changed significantly in size compared to the study in February.  The CT does show some evidence of stenosis at the celiac artery and milder stenosis of the superior mesenteric artery.  The IMA is occluded proximally.  MEDICAL ISSUES:  ABDOMINAL AORTIC ANEURYSM: This patient has a 5.1 cm infrarenal abdominal aortic aneurysm.  This is not changed significantly in size since his ultrasound in February.  I have explained that in a normal risk patient we would consider elective repair at 5.5 cm.  He is 80 years old.  I have ordered a follow-up ultrasound in 6 months and I will see him back at that time.  If the aneurysm enlarges and we need to consider elective repair, based on his CT scan I think he would potentially be a candidate for an endovascular repair.  He appears to have an adequate neck proximally.  He does have some calcific disease in the iliac arteries bilaterally and also the common femoral arteries.  We have discussed the importance of keeping his blood pressure under good control.  We will see him back in 6 months.  He knows to call sooner if he has problems.  FOLLOW UP INSTRUCTIONS:   I discussed the assessment and treatment plan with the patient. The patient was provided an opportunity to ask questions and all were answered. The patient agreed with the plan and demonstrated an understanding of the instructions. The patient was advised to call back or seek an in-person evaluation if the symptoms worsen or if the condition fails to improve as anticipated.  I provided 7 minutes of non-face-to-face time during this encounter.  Juan [name] [facility]

## 2021-01-31 ENCOUNTER — Other Ambulatory Visit: Payer: Self-pay

## 2021-01-31 DIAGNOSIS — I714 Abdominal aortic aneurysm, without rupture, unspecified: Secondary | ICD-10-CM

## 2021-02-08 ENCOUNTER — Encounter: Payer: Self-pay | Admitting: Family Medicine

## 2021-02-18 ENCOUNTER — Ambulatory Visit (INDEPENDENT_AMBULATORY_CARE_PROVIDER_SITE_OTHER): Payer: Medicare HMO | Admitting: Family Medicine

## 2021-02-18 ENCOUNTER — Other Ambulatory Visit: Payer: Self-pay

## 2021-02-18 ENCOUNTER — Encounter: Payer: Self-pay | Admitting: Family Medicine

## 2021-02-18 VITALS — BP 152/70 | HR 58 | Temp 97.8°F | Ht 70.0 in | Wt 246.4 lb

## 2021-02-18 DIAGNOSIS — N1832 Chronic kidney disease, stage 3b: Secondary | ICD-10-CM

## 2021-02-18 DIAGNOSIS — E119 Type 2 diabetes mellitus without complications: Secondary | ICD-10-CM | POA: Diagnosis not present

## 2021-02-18 DIAGNOSIS — I10 Essential (primary) hypertension: Secondary | ICD-10-CM

## 2021-02-18 NOTE — Progress Notes (Signed)
Subjective:    Patient ID: Juan [name], male    DOB: 1940-05-31, 80 y.o.   MRN: [id number]  Patient is a very pleasant 80 year old Caucasian gentleman who is here today to discuss his blood pressure.  He is currently on maximum dose Cardura and lisinopril.  He has a history of diabetes and stage III chronic kidney disease.  He is already on 25 mg metoprolol and 240 mg of Cardizem.  He is on Lasix and therefore we do not have him on hydrochlorothiazide.  His systolic blood pressures at home have been averaging 150.  He denies any chest pain shortness of breath or dyspnea on exertion.  Heart rate has been averaging between 70 and 80.  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    50-69% (12/2015)   Neuromuscular disorder ([place])    L2-3,L5-S1 bulging disc /nerve impingement   PAD (peripheral artery disease) ([place])    Past Surgical History:  Procedure Laterality Date   COLONOSCOPY N/A 09/27/2012   Procedure: COLONOSCOPY;  Surgeon: Daneil Dolin, MD;  Location: [facility];  Service: Endoscopy;  Laterality: N/A;  9:30   CORONARY ANGIOPLASTY     1989   TONSILLECTOMY     Current Outpatient Medications on File Prior to Visit  Medication Sig Dispense Refill   allopurinol (ZYLOPRIM) 300 MG tablet TAKE 1 TABLET EVERY DAY 90 tablet 3   aspirin EC 81 MG tablet Take 81 mg by mouth daily.     benzonatate (TESSALON PERLES) 100 MG capsule Take 1 capsule (100 mg total) by mouth every 6 (six) hours as needed for cough. 30 capsule 0   Blood Glucose Monitoring Suppl (TRUE METRIX METER) w/Device KIT Use as Directed 1 kit 1   diltiazem (CARDIZEM CD)  240 MG 24 hr capsule TAKE 1 CAPSULE EVERY DAY 90 capsule 1   doxazosin (CARDURA) 8 MG tablet TAKE 1 TABLET EVERY DAY 90 tablet 1   Dulaglutide (TRULICITY) 1.5 YD/7.4JO SOPN Inject 1.5 mg into the skin once a week. Begin after 0.61m completed. 4 mL 11   furosemide (LASIX) 40 MG tablet Take 1 tablet (40 mg total) by mouth daily. 30 tablet 3   glipiZIDE (GLUCOTROL) 5 MG tablet TAKE 1 TABLET TWICE DAILY BEFORE MEALS 180 tablet 2   ketorolac (ACULAR) 0.5 % ophthalmic solution Place 1 drop into the left eye 2 (two) times daily. 5 mL 4   Lancet Devices (PRODIGY LANCING DEVICE) MISC Use bid DX e11.9 1 each 2   lisinopril (ZESTRIL) 40 MG tablet TAKE 1 TABLET EVERY DAY 90 tablet 3   metoprolol succinate (TOPROL-XL) 25 MG 24 hr tablet TAKE 1 TABLET EVERY DAY 90 tablet 3   Multiple Vitamin (MULTIVITAMIN WITH MINERALS) TABS Take 1 tablet by mouth daily.     Omega-3 Fatty Acids (FISH OIL PO) Take by mouth.     pantoprazole (PROTONIX) 20 MG tablet TAKE 1 TABLET EVERY DAY 90 tablet 3   potassium chloride SA (KLOR-CON) 20 MEQ tablet Take 1 tablet (20 mEq total) by mouth daily. 90 tablet 3   rOPINIRole (REQUIP)  0.5 MG tablet Take 1 tablet (0.5 mg total) by mouth at bedtime. 30 tablet 3   rosuvastatin (CRESTOR) 20 MG tablet TAKE 1 TABLET EVERY DAY 90 tablet 1   TRUE METRIX BLOOD GLUCOSE TEST test strip CHECK BLOOD SUGAR EVERY DAY 100 strip 1   TRUEplus Lancets 33G MISC Check BS QD - dx: e11.9 100 each 3   No current facility-administered medications on file prior to visit.   Allergies  Allergen Reactions   Jardiance [Empagliflozin]     Severe nausea-    Social History   Socioeconomic History   Marital status: Married    Spouse name: Not on file   Number of children: Not on file   Years of education: Not on file   Highest education level: Not on file  Occupational History   Occupation: retired    Fish farm manager: RETIRED    Comment: [employer]   Tobacco Use   Smoking status: Former    Years: 35.00     Types: Cigarettes    Quit date: 01/02/1988    Years since quitting: 33.1   Smokeless tobacco: Never  Vaping Use   Vaping Use: Never used  Substance and Sexual Activity   Alcohol use: Not Currently    Comment: rare beer, no history of ETOH abuse   Drug use: No   Sexual activity: Not on file  Other Topics Concern   Not on file  Social History Narrative   Not on file   Social Determinants of Health   Financial Resource Strain: Not on file  Food Insecurity: Not on file  Transportation Needs: No Transportation Needs   Lack of Transportation (Medical): No   Lack of Transportation (Non-Medical): No  Physical Activity: Not on file  Stress: Not on file  Social Connections: Not on file  Intimate Partner Violence: Not on file   Family History  Problem Relation Age of Onset   Diabetes Mother    Heart disease Mother        After age 54   Heart attack Mother    Hypertension Mother    Diabetes Father    Heart disease Father        After age 46   Hypertension Father    Heart attack Father    Brain cancer Sister    Cancer Sister        Brain   Diabetes Sister    Hypertension Sister    Cancer Brother        Chest Tumor   Diabetes Brother    Hypertension Brother    Deep vein thrombosis Sister    Diabetes Sister    Diabetes Brother        Bilateral leg   Colon cancer Neg Hx       Review of Systems  All other systems reviewed and are negative.     Objective:   Physical Exam Vitals reviewed.  Constitutional:      General: He is not in acute distress.    Appearance: He is well-developed. He is not diaphoretic.  HENT:     Head: Normocephalic and atraumatic.  Neck:     Thyroid: No thyromegaly.     Vascular: No JVD.     Trachea: No tracheal deviation.  Cardiovascular:     Rate and Rhythm: Normal rate and regular rhythm.     Heart sounds: Murmur heard.    No friction rub. No gallop.  Pulmonary:     Effort: Pulmonary effort is normal. No respiratory  distress.      Breath sounds: Normal breath sounds. No stridor. No wheezing or rales.  Chest:     Chest wall: No tenderness.  Abdominal:     General: Bowel sounds are normal.     Palpations: Abdomen is soft.  Neurological:     Mental Status: He is alert and oriented to person, place, and time.     Cranial Nerves: No cranial nerve deficit.     Motor: No abnormal muscle tone.     Coordination: Coordination normal.     Deep Tendon Reflexes: Reflexes are normal and symmetric.  Psychiatric:        Behavior: Behavior normal.        Thought Content: Thought content normal.        Judgment: Judgment normal.          Assessment & Plan:   Diabetes mellitus without complication (HCC)  Essential hypertension  Stage 3b chronic kidney disease ([place]) Recommended increasing Toprol-XL to 50 mg a day and then rechecking in 3 weeks.  If working, I will increase his prescription to 50 mg a day

## 2021-02-21 IMAGING — DX DG CHEST 2V
2 series · 2 of 2 positions shown · non-contrast
Comparison: 12/03/2017

CLINICAL DATA: Shortness of breath

EXAM:
CHEST - 2 VIEW

[chest pa]
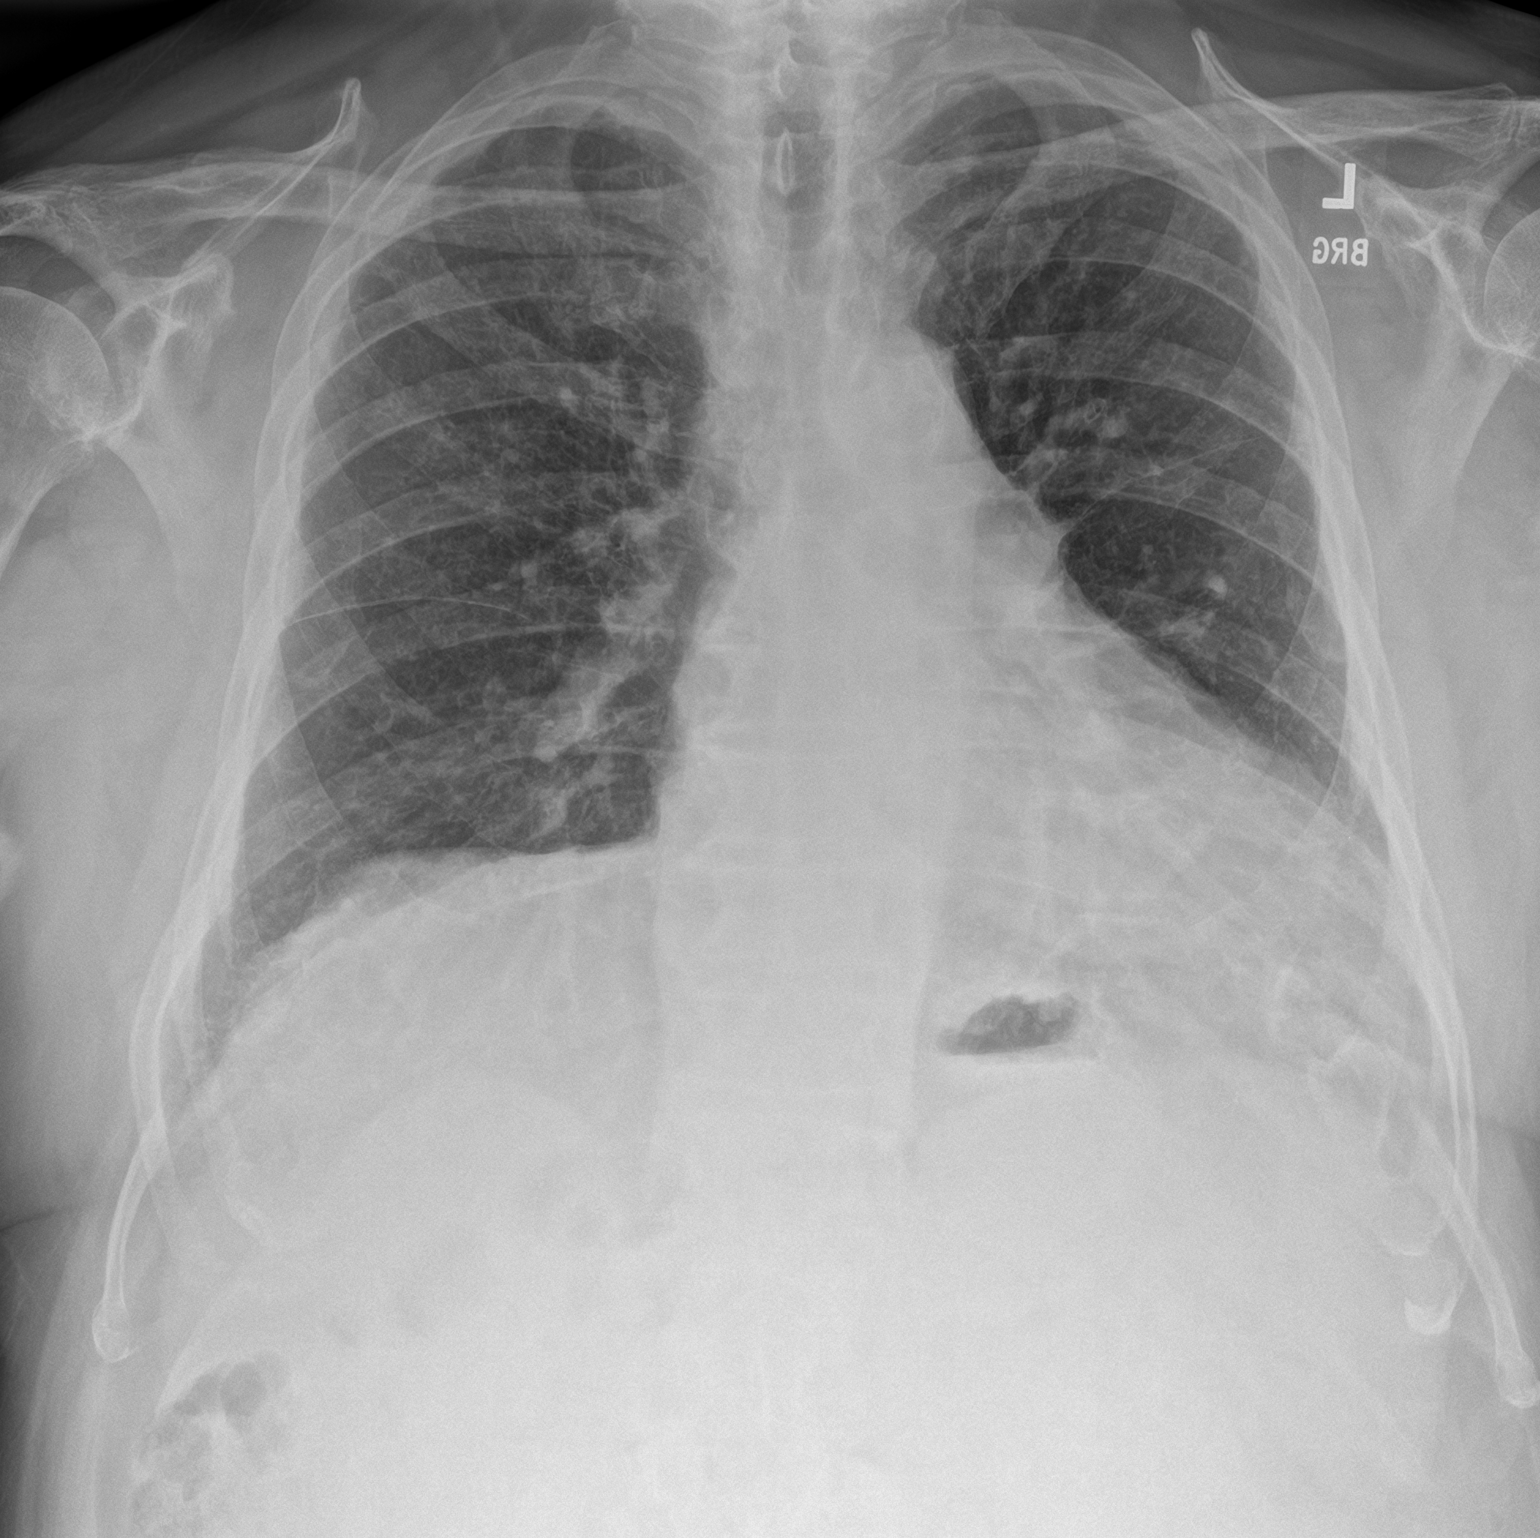

[chest lat]
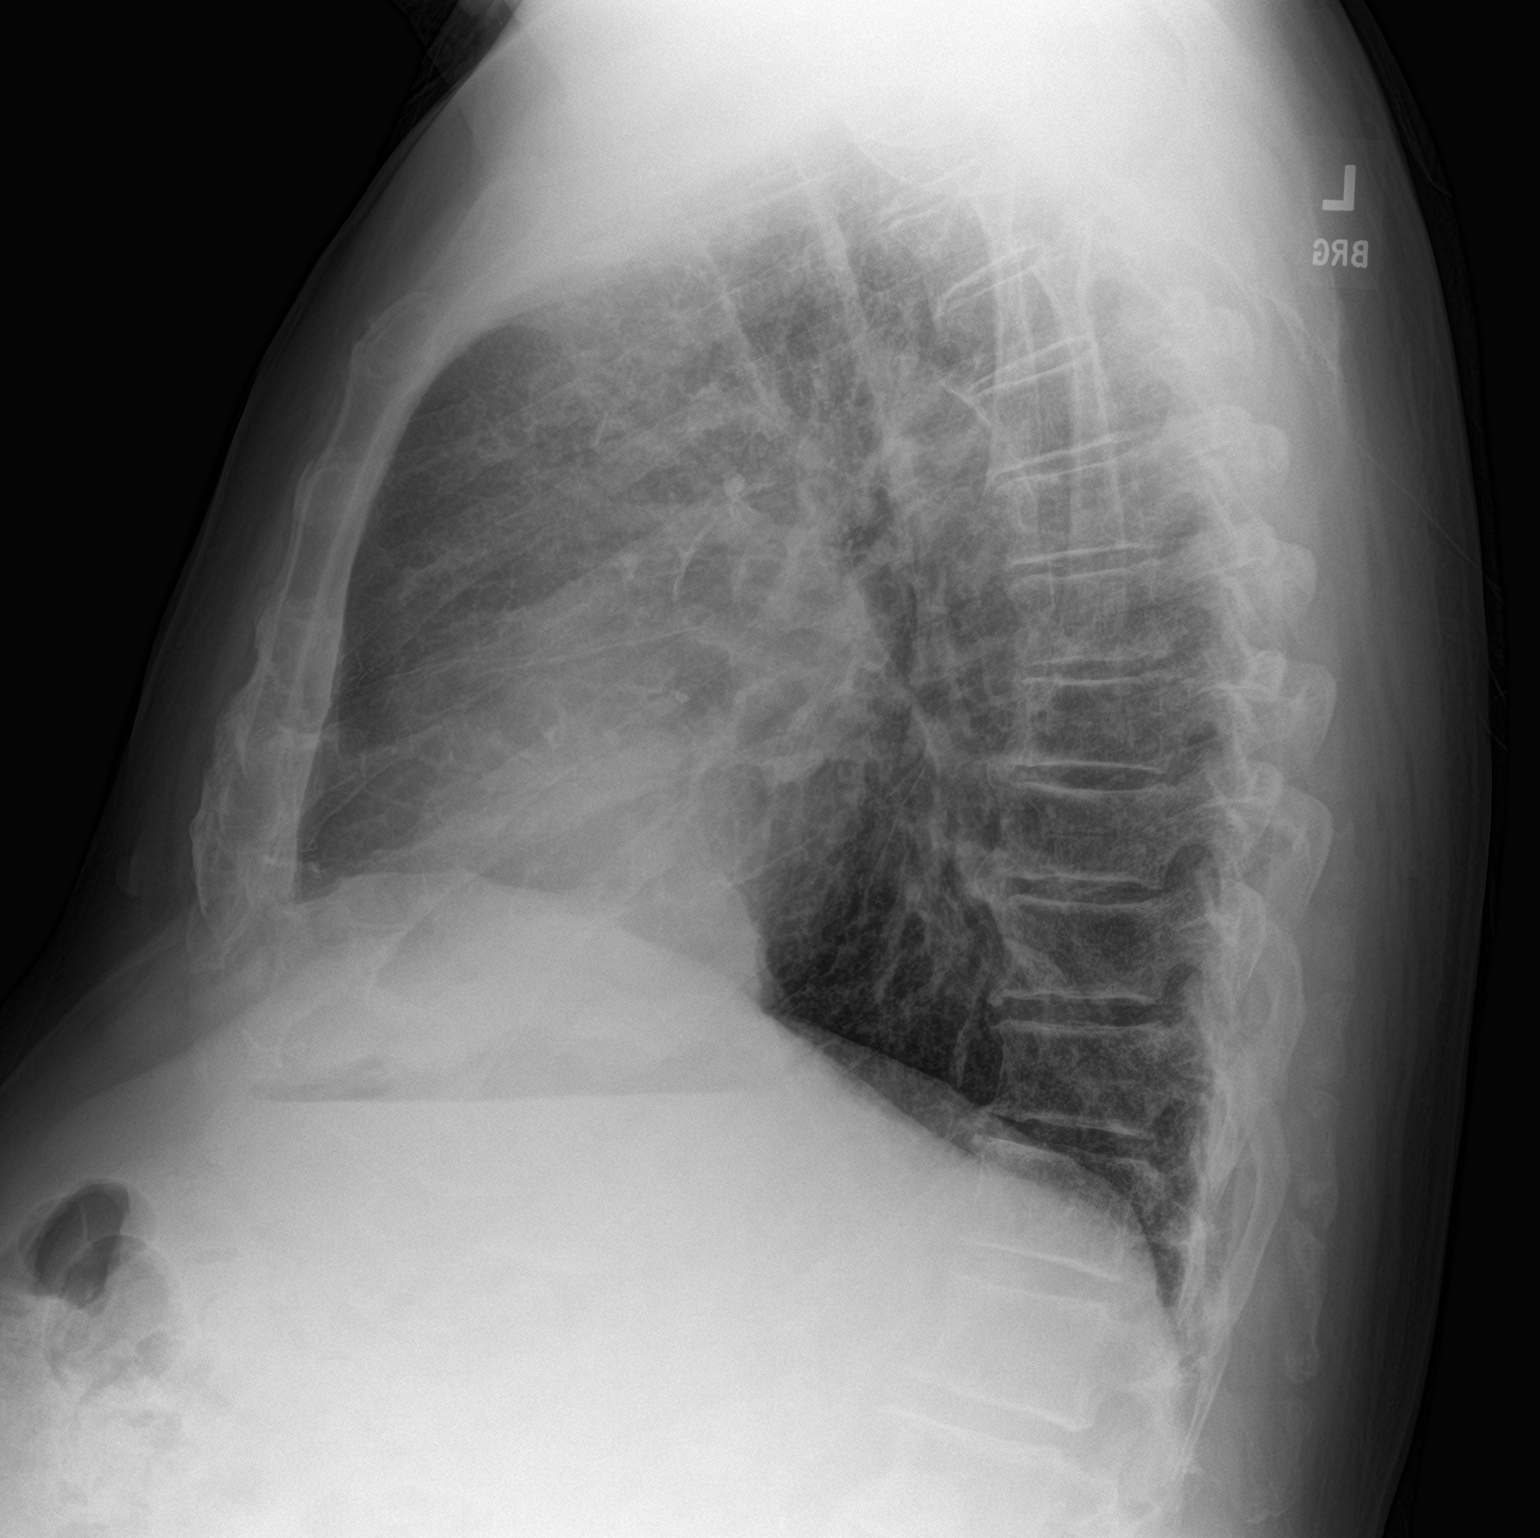

[2 of 2 positions shown; findings below may reference images not displayed]

FINDINGS: No pleural effusion. The right lung is clear. Opacity at the
lingula. Stable cardiomediastinal silhouette. No pneumothorax.
IMPRESSION: Opacity at the lingula, atelectasis versus small infiltrate.

## 2021-02-25 ENCOUNTER — Encounter: Payer: Self-pay | Admitting: Family Medicine

## 2021-02-28 ENCOUNTER — Other Ambulatory Visit: Payer: Self-pay

## 2021-02-28 MED ORDER — METOPROLOL SUCCINATE ER 50 MG PO TB24: 50.0000 mg | ORAL_TABLET | Freq: Every day | ORAL | 3 refills | Status: DC

## 2021-03-05 ENCOUNTER — Encounter: Payer: Self-pay | Admitting: Family Medicine

## 2021-03-08 NOTE — Telephone Encounter (Signed)
Gerald Stabs - did you receive this paperwork? Thanks!

## 2021-03-11 ENCOUNTER — Telehealth: Payer: Medicare HMO

## 2021-03-13 IMAGING — DX DG CHEST 2V
2 series · 2 of 2 positions shown · non-contrast
Comparison: 11/04/2019.  12/03/2017.

CLINICAL DATA: Pneumonia.  Follow-up.

EXAM:
CHEST - 2 VIEW

[chest pa]
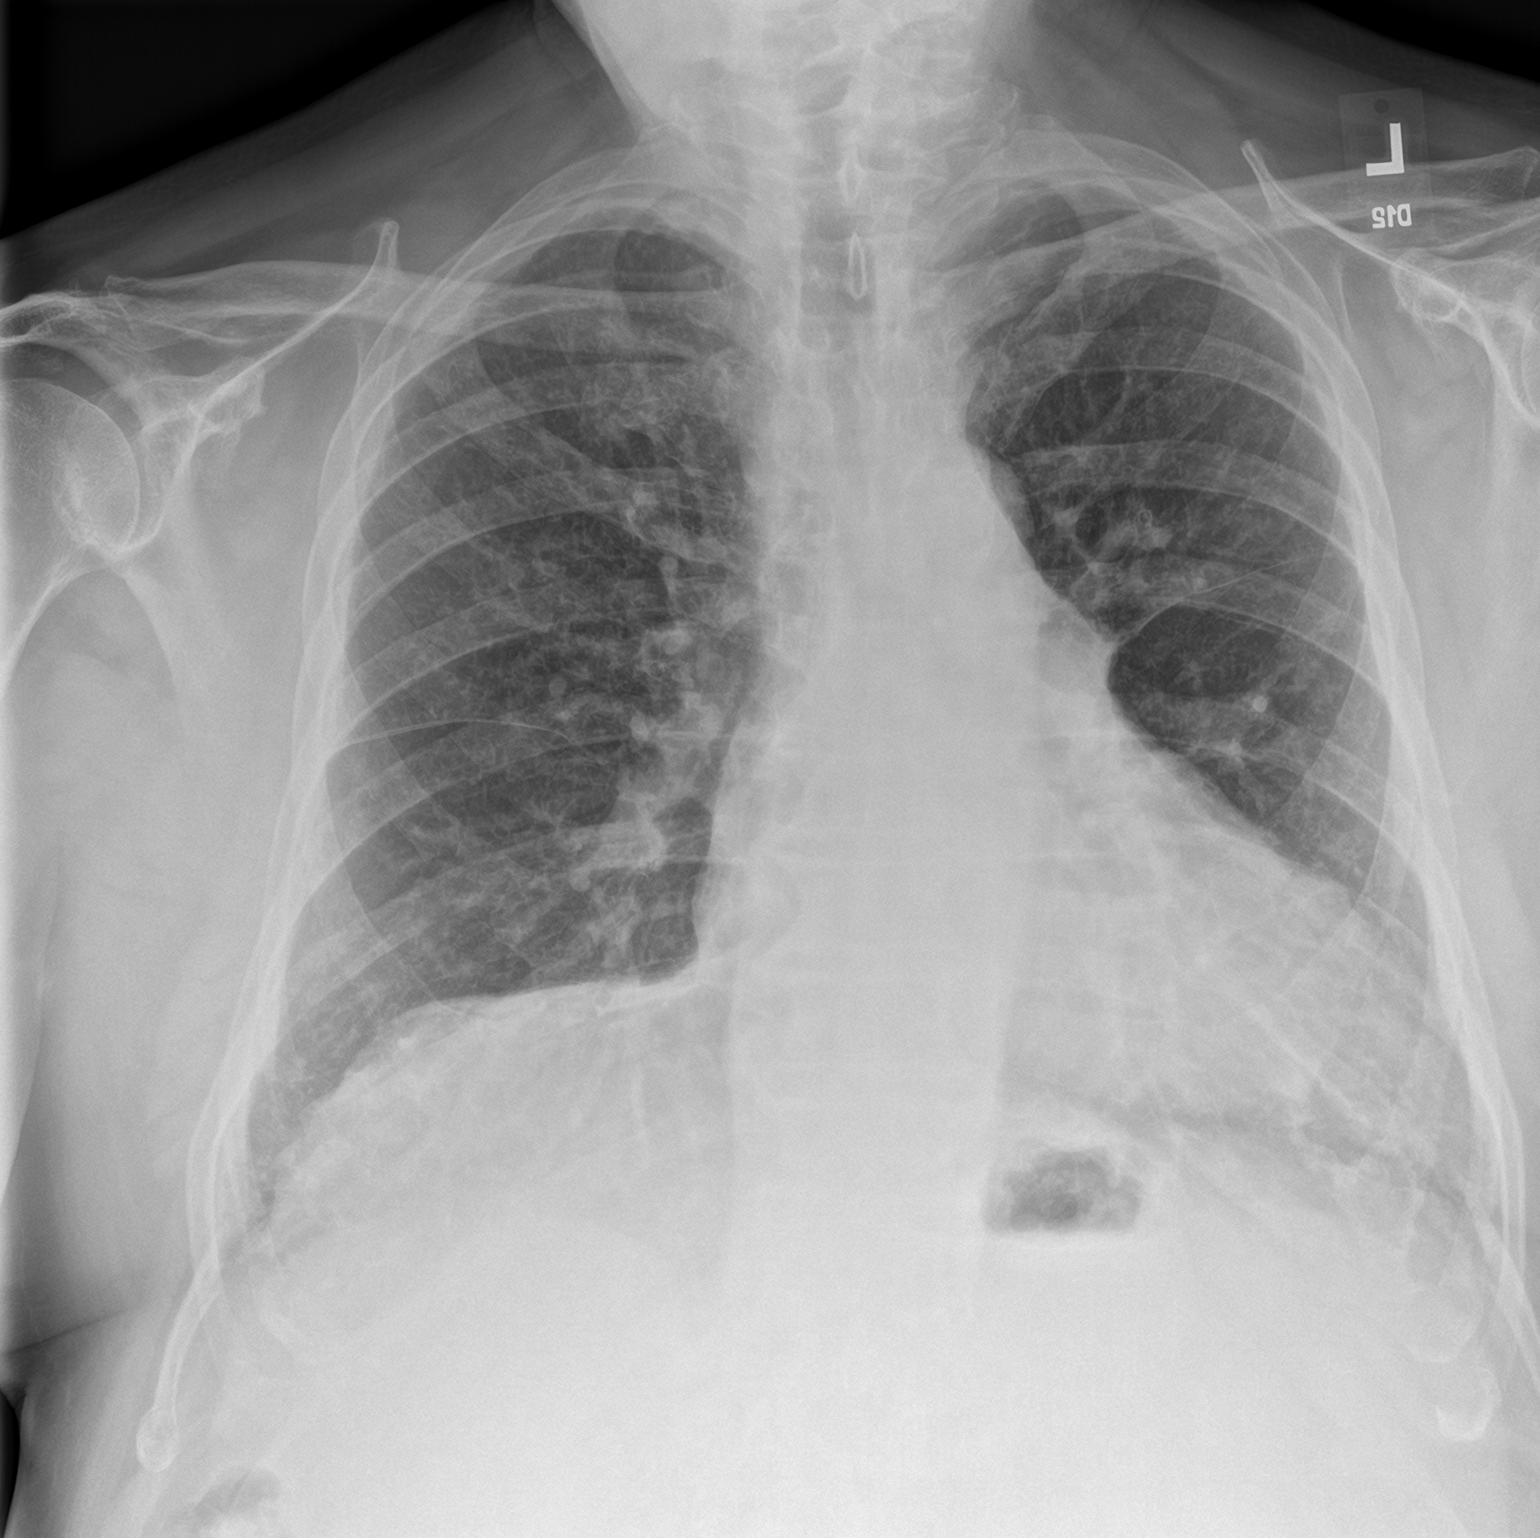

[chest lat]
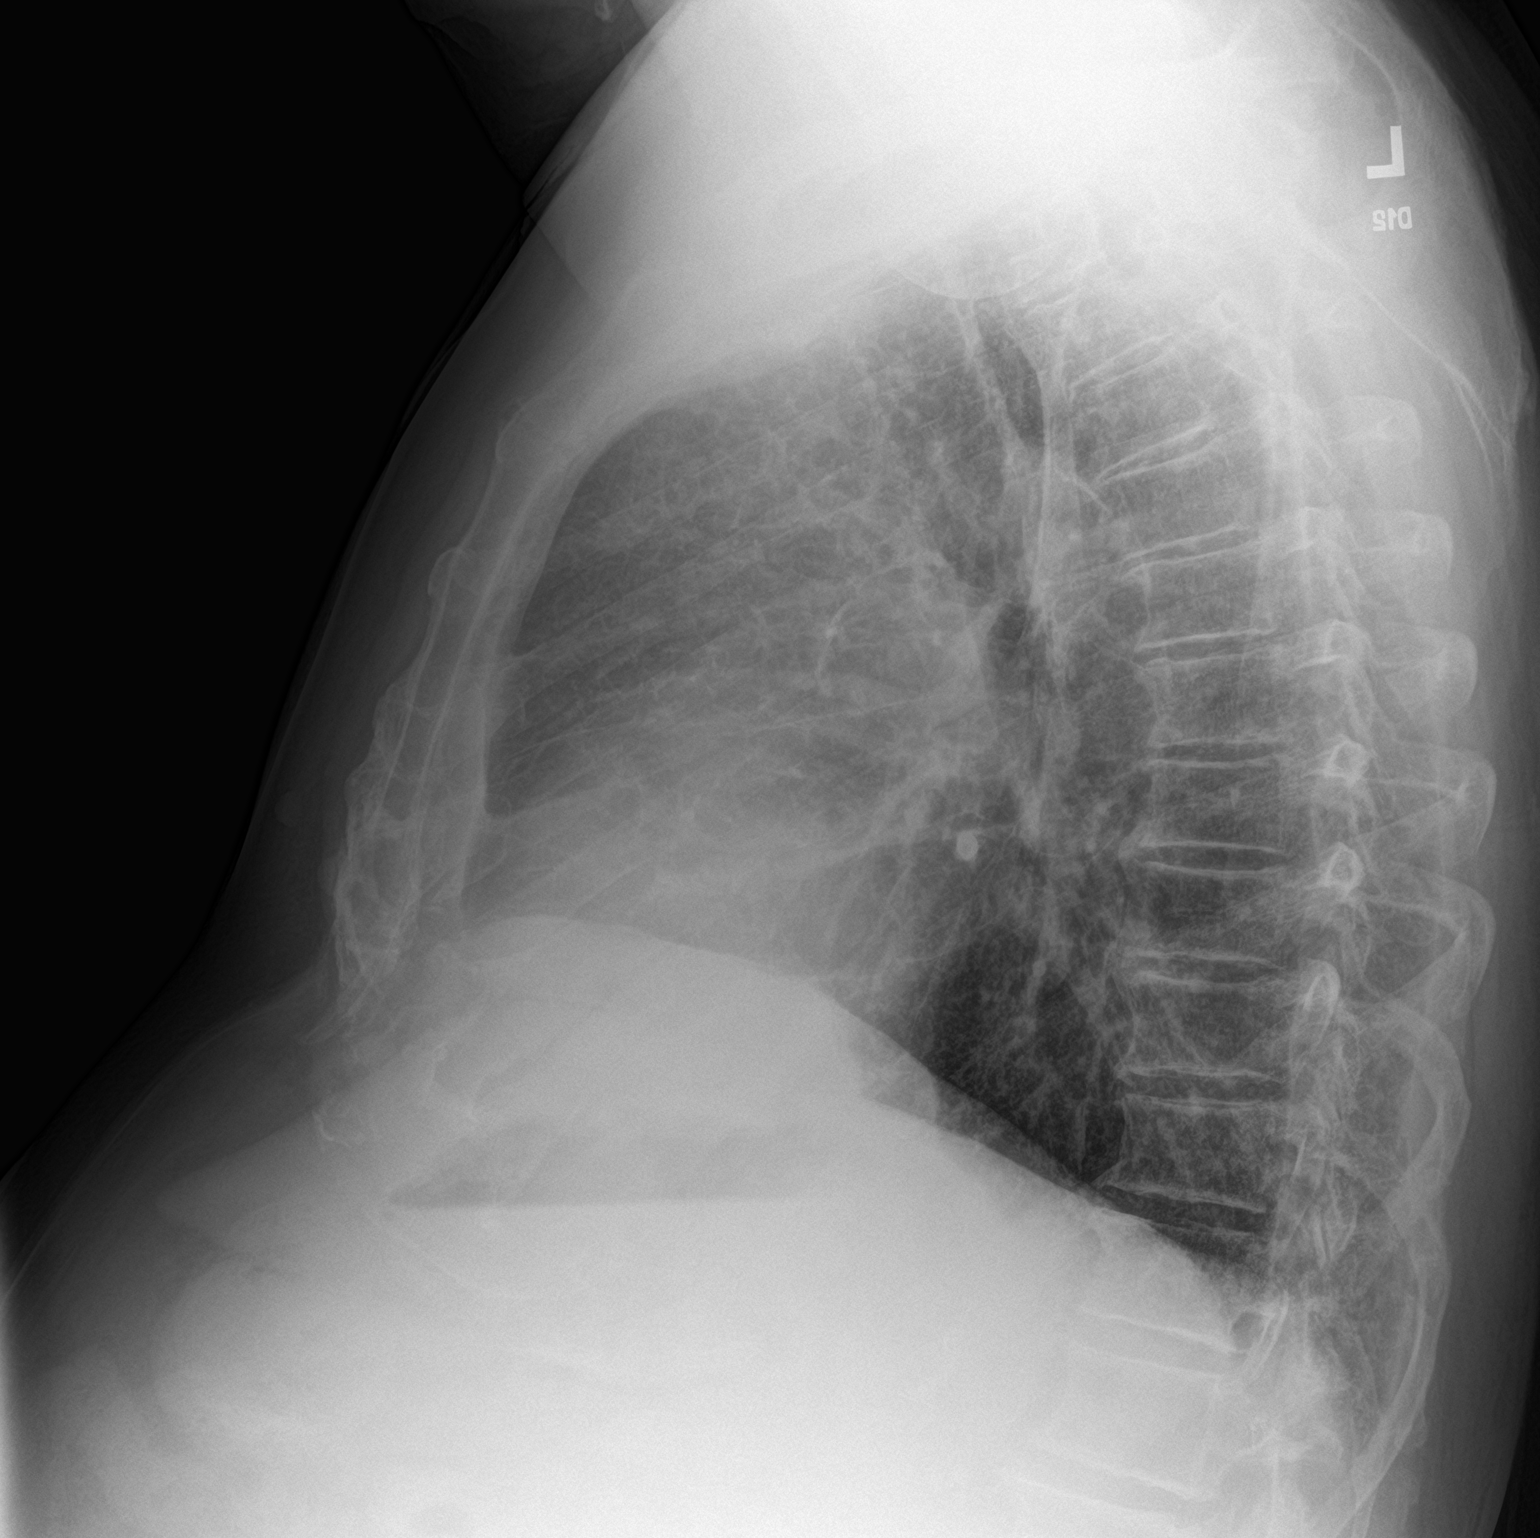

[2 of 2 positions shown; findings below may reference images not displayed]

FINDINGS: Heart size is normal. Mediastinal shadows are normal except for
ordinary aortic atherosclerosis. Chronic interstitial pulmonary
markings are slightly progressive over time. No sign of
consolidation or collapse. No lingular infiltrate on today's study.
No significant bone finding.
IMPRESSION: Chronic interstitial lung markings, slightly progressive over time.
No sign of consolidation or collapse. No active disease suspected
presently.

## 2021-05-22 ENCOUNTER — Other Ambulatory Visit: Payer: Self-pay | Admitting: Family Medicine

## 2021-06-04 ENCOUNTER — Other Ambulatory Visit: Payer: Self-pay | Admitting: Family Medicine

## 2021-06-04 DIAGNOSIS — E119 Type 2 diabetes mellitus without complications: Secondary | ICD-10-CM

## 2021-06-25 ENCOUNTER — Other Ambulatory Visit: Payer: Self-pay | Admitting: Family Medicine

## 2021-07-28 ENCOUNTER — Ambulatory Visit (HOSPITAL_COMMUNITY)
Admission: RE | Admit: 2021-07-28 | Discharge: 2021-07-28 | Disposition: A | Discharge status: Home / Self Care | Payer: Medicare HMO | Source: Ambulatory Visit | Attending: Vascular Surgery | Admitting: Vascular Surgery

## 2021-07-28 ENCOUNTER — Ambulatory Visit: Payer: Medicare HMO | Admitting: Vascular Surgery

## 2021-07-28 ENCOUNTER — Encounter: Payer: Self-pay | Admitting: Vascular Surgery

## 2021-07-28 VITALS — BP 181/78 | HR 74 | Temp 97.5°F | Resp 20 | Ht 70.0 in | Wt 243.0 lb

## 2021-07-28 DIAGNOSIS — I714 Abdominal aortic aneurysm, without rupture, unspecified: Secondary | ICD-10-CM | POA: Insufficient documentation

## 2021-07-28 DIAGNOSIS — I7143 Infrarenal abdominal aortic aneurysm, without rupture: Secondary | ICD-10-CM | POA: Diagnosis not present

## 2021-07-28 NOTE — Progress Notes (Signed)
REASON FOR VISIT:   Follow-up of abdominal aortic aneurysm.  MEDICAL ISSUES:   ABDOMINAL AORTIC ANEURYSM: This patient has a 5.2 cm infrarenal abdominal aortic aneurysm and bilateral common iliac artery aneurysms.  The right common iliac artery measures 2.1 cm in maximum diameter and the left common iliac artery measures 3.0 cm in maximum diameter.  I explained that in her normal risk patient we would consider elective repair at 5.5 cm.  He is 81 years old with coronary artery disease, chronic kidney disease and obstructive sleep apnea.  Thus he will be at slightly higher risk for surgery.  Based on his previous CT scan he would likely be a candidate for endovascular approach although he does have some evidence of iliac disease and his study today suggested some stenoses in both common iliac arteries.  Fortunately he is not a smoker.  His blood pressure was a little high today and he will follow this closely.  I have ordered a follow-up ultrasound in 6 months and I will see him back at that time.  He knows to call sooner if he has problems.  If the aneurysm enlarges significantly that his next follow-up study would have to be a CT angio.   HPI:   Juan [name] is a pleasant 81 y.o. male who I did a virtual visit with on 01/27/2021.  At that time I reviewed his CT angiogram that was done on 01/11/2021.  At that time his aneurysm measured 5.1 cm in maximum diameter.  This had not changed compared to the ultrasound in February 2022.  Based on his CAT scan I felt that he would potentially be a candidate for endovascular repair if this aneurysm enlarged significantly.  He did have calcific disease in his iliac arteries bilaterally but appeared to have an adequate neck.  He also had some disease in his common femoral arteries.  Since I saw him last, he does have chronic back pain.  He also have some hip claudication which may either be secondary to arthritis or potentially related to his common iliac  artery disease.  The symptoms usually occur when he is walking.  He is not a smoker.  He quit in 1989.  He has had no significant changes in his medical history.  He is on aspirin and is on a statin.  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    50-69% (12/2015)   Neuromuscular disorder (HCC)    L2-3,L5-S1 bulging disc /nerve impingement   PAD (peripheral artery disease) ([place])     Family History  Problem Relation Age of Onset   Diabetes Mother    Heart disease Mother        After age 79   Heart attack Mother    Hypertension Mother    Diabetes Father    Heart disease Father        After age 38   Hypertension Father    Heart attack Father    Brain cancer Sister    Cancer Sister        Brain   Diabetes  Sister    Hypertension Sister    Cancer Brother        Chest Tumor   Diabetes Brother    Hypertension Brother    Deep vein thrombosis Sister    Diabetes Sister    Diabetes Brother        Bilateral leg   Colon cancer Neg Hx     SOCIAL HISTORY: Social History   Tobacco Use   Smoking status: Former    Years: 35.00    Types: Cigarettes    Quit date: 01/02/1988    Years since quitting: 33.5   Smokeless tobacco: Never  Substance Use Topics   Alcohol use: Not Currently    Comment: rare beer, no history of ETOH abuse    Allergies  Allergen Reactions   Jardiance [Empagliflozin]     Severe nausea-     Current Outpatient Medications  Medication Sig Dispense Refill   allopurinol (ZYLOPRIM) 300 MG tablet TAKE 1 TABLET EVERY DAY 90 tablet 3   aspirin EC 81 MG tablet Take 81 mg by mouth daily.     Blood  Glucose Monitoring Suppl (TRUE METRIX METER) w/Device KIT Use as Directed 1 kit 1   diltiazem (CARDIZEM CD) 240 MG 24 hr capsule TAKE 1 CAPSULE EVERY DAY 90 capsule 1   doxazosin (CARDURA) 8 MG tablet TAKE 1 TABLET EVERY DAY 90 tablet 1   Dulaglutide (TRULICITY) 1.5 BT/6.6MA SOPN Inject 1.5 mg into the skin once a week. Begin after 0.89m completed. 4 mL 11   furosemide (LASIX) 40 MG tablet Take 1 tablet (40 mg total) by mouth daily. 30 tablet 3   glipiZIDE (GLUCOTROL) 5 MG tablet TAKE 1 TABLET TWICE DAILY BEFORE MEALS 180 tablet 2   glucose blood (TRUE METRIX BLOOD GLUCOSE TEST) test strip 1 each by Other route daily. DX: E11.9 100 strip 5   Lancet Devices (PRODIGY LANCING DEVICE) MISC Use bid DX e11.9 1 each 2   lisinopril (ZESTRIL) 40 MG tablet TAKE 1 TABLET EVERY DAY 90 tablet 3   metoprolol succinate (TOPROL-XL) 50 MG 24 hr tablet Take 1 tablet (50 mg total) by mouth daily. Take with or immediately following a meal. 90 tablet 3   Multiple Vitamin (MULTIVITAMIN WITH MINERALS) TABS Take 1 tablet by mouth daily.     Omega-3 Fatty Acids (FISH OIL PO) Take by mouth.     pantoprazole (PROTONIX) 20 MG tablet TAKE 1 TABLET EVERY DAY 90 tablet 3   potassium chloride SA (KLOR-CON) 20 MEQ tablet Take 1 tablet (20 mEq total) by mouth daily. 90 tablet 3   rOPINIRole (REQUIP) 0.5 MG tablet Take 1 tablet (0.5 mg total) by mouth at bedtime. 30 tablet 3   rosuvastatin (CRESTOR) 20 MG tablet TAKE 1 TABLET EVERY DAY 90 tablet 1   TRUEplus Lancets 33G MISC Check BS QD - dx: e11.9 100 each 3   ketorolac (ACULAR) 0.5 % ophthalmic solution Place 1 drop into the left eye 2 (two) times daily. (Patient not taking: Reported on 07/28/2021) 5 mL 4   No current facility-administered medications for this visit.    REVIEW OF SYSTEMS:  '[X]'  denotes positive finding, '[ ]'  denotes negative finding Cardiac  Comments:  Chest pain or chest pressure:    Shortness of breath upon exertion: x   Short of breath when lying flat:     Irregular heart rhythm:        Vascular    Pain in calf, thigh, or hip brought on by ambulation: x  Pain in feet at night that wakes you up from your sleep:     Blood clot in your veins:    Leg swelling:         Pulmonary    Oxygen at home:    Productive cough:     Wheezing:         Neurologic    Sudden weakness in arms or legs:     Sudden numbness in arms or legs:     Sudden onset of difficulty speaking or slurred speech:    Temporary loss of vision in one eye:     Problems with dizziness:         Gastrointestinal    Blood in stool:     Vomited blood:         Genitourinary    Burning when urinating:     Blood in urine:        Psychiatric    Major depression:         Hematologic    Bleeding problems:    Problems with blood clotting too easily:        Skin    Rashes or ulcers:        Constitutional    Fever or chills:     PHYSICAL EXAM:   Vitals:   07/28/21 0833  BP: (!) 181/78  Pulse: 74  Resp: 20  Temp: (!) 97.5 F (36.4 C)  SpO2: 95%  Weight: 243 lb (110.2 kg)  Height: '5\' 10"'  (1.778 m)   Body mass index is 34.87 kg/m.  GENERAL: The patient is a well-nourished male, in no acute distress. The vital signs are documented above. CARDIAC: There is a regular rate and rhythm.  VASCULAR: I do not detect carotid bruits. He does have palpable femoral, popliteal, and posterior tibial pulses bilaterally.  I can palpate a dorsalis pedis pulse on the right but not on the left. He has hyperpigmentation consistent with chronic venous insufficiency. PULMONARY: There is good air exchange bilaterally without wheezing or rales. ABDOMEN: Soft and non-tender with normal pitched bowel sounds.  MUSCULOSKELETAL: There are no major deformities or cyanosis. NEUROLOGIC: No focal weakness or paresthesias are detected. SKIN: There are no ulcers or rashes noted. PSYCHIATRIC: The patient has a normal affect.  DATA:    DUPLEX ABDOMINAL AORTA: I have independently interpreted  his duplex of the abdominal aorta.  The maximum diameter of his aneurysm is 5.2 cm.  This is not changed significantly compared to 5.1 cm based on his CAT scan in November 2022.  The right common iliac artery measures 2.1 cm in maximum diameter.  The left common iliac artery measures 3.0 cm in maximum diameter.  Deitra Mayo [facility] [phone]

## 2021-08-02 ENCOUNTER — Other Ambulatory Visit: Payer: Self-pay | Admitting: *Deleted

## 2021-08-02 DIAGNOSIS — I714 Abdominal aortic aneurysm, without rupture, unspecified: Secondary | ICD-10-CM

## 2021-08-19 ENCOUNTER — Other Ambulatory Visit: Payer: Self-pay | Admitting: Family Medicine

## 2021-09-19 ENCOUNTER — Ambulatory Visit: Payer: Self-pay | Admitting: *Deleted

## 2021-09-19 DIAGNOSIS — E119 Type 2 diabetes mellitus without complications: Secondary | ICD-10-CM

## 2021-09-19 DIAGNOSIS — I10 Essential (primary) hypertension: Secondary | ICD-10-CM

## 2021-09-19 NOTE — Chronic Care Management (AMB) (Signed)
   09/19/2021  [name] 1940/05/18 [id number]   Patient cancelled previous scheduled appointment, did not reschedule.  Care plan updated and resolved.  Case closed.  Jacqlyn Larsen RNC, BSN RN Case Manager [facility] [phone]

## 2021-10-04 ENCOUNTER — Other Ambulatory Visit: Payer: Medicare HMO

## 2021-10-04 DIAGNOSIS — E119 Type 2 diabetes mellitus without complications: Secondary | ICD-10-CM | POA: Diagnosis not present

## 2021-10-04 DIAGNOSIS — N1832 Chronic kidney disease, stage 3b: Secondary | ICD-10-CM

## 2021-10-04 DIAGNOSIS — I2510 Atherosclerotic heart disease of native coronary artery without angina pectoris: Secondary | ICD-10-CM | POA: Diagnosis not present

## 2021-10-04 DIAGNOSIS — I10 Essential (primary) hypertension: Secondary | ICD-10-CM

## 2021-10-05 LAB — COMPREHENSIVE METABOLIC PANEL
AG Ratio: 1.8 (calc) (ref 1.0–2.5)
ALT: 13 U/L (ref 9–46)
AST: 12 U/L (ref 10–35)
Albumin: 3.9 g/dL (ref 3.6–5.1)
Alkaline phosphatase (APISO): 63 U/L (ref 35–144)
BUN/Creatinine Ratio: 12 (calc) (ref 6–22)
BUN: 22 mg/dL (ref 7–25)
CO2: 22 mmol/L (ref 20–32)
Calcium: 9 mg/dL (ref 8.6–10.3)
Chloride: 108 mmol/L (ref 98–110)
Creat: 1.77 mg/dL — ABNORMAL HIGH (ref 0.70–1.22)
Globulin: 2.2 g/dL (calc) (ref 1.9–3.7)
Glucose, Bld: 141 mg/dL — ABNORMAL HIGH (ref 65–99)
Potassium: 4.8 mmol/L (ref 3.5–5.3)
Sodium: 141 mmol/L (ref 135–146)
Total Bilirubin: 0.4 mg/dL (ref 0.2–1.2)
Total Protein: 6.1 g/dL (ref 6.1–8.1)

## 2021-10-05 LAB — LIPID PANEL
Cholesterol: 116 mg/dL (ref <200)
HDL: 39 mg/dL — ABNORMAL LOW (ref >=40)
LDL Cholesterol (Calc): 56 mg/dL (calc)
Non-HDL Cholesterol (Calc): 77 mg/dL (calc) (ref <130)
Total CHOL/HDL Ratio: 3 (calc) (ref <5.0)
Triglycerides: 119 mg/dL (ref <150)

## 2021-10-05 LAB — CBC WITH DIFFERENTIAL/PLATELET
Absolute Monocytes: 606 cells/uL (ref 200–950)
Basophils Absolute: 18 cells/uL (ref 0–200)
Basophils Relative: 0.3 %
Eosinophils Absolute: 138 cells/uL (ref 15–500)
Eosinophils Relative: 2.3 %
HCT: 33.4 % — ABNORMAL LOW (ref 38.5–50.0)
Hemoglobin: 11.5 g/dL — ABNORMAL LOW (ref 13.2–17.1)
Lymphs Abs: 1488 cells/uL (ref 850–3900)
MCH: 31.6 pg (ref 27.0–33.0)
MCHC: 34.4 g/dL (ref 32.0–36.0)
MCV: 91.8 fL (ref 80.0–100.0)
MPV: 9.8 fL (ref 7.5–12.5)
Monocytes Relative: 10.1 %
Neutro Abs: 3750 cells/uL (ref 1500–7800)
Neutrophils Relative %: 62.5 %
Platelets: 110 10*3/uL — ABNORMAL LOW (ref 140–400)
RBC: 3.64 10*6/uL — ABNORMAL LOW (ref 4.20–5.80)
RDW: 13.5 % (ref 11.0–15.0)
Total Lymphocyte: 24.8 %
WBC: 6 10*3/uL (ref 3.8–10.8)

## 2021-10-05 LAB — HEMOGLOBIN A1C
Hgb A1c MFr Bld: 6.7 % of total Hgb — ABNORMAL HIGH (ref <5.7)
Mean Plasma Glucose: 146 mg/dL
eAG (mmol/L): 8.1 mmol/L

## 2021-10-07 ENCOUNTER — Encounter: Payer: Self-pay | Admitting: Family Medicine

## 2021-10-07 ENCOUNTER — Other Ambulatory Visit: Payer: Self-pay | Admitting: Family Medicine

## 2021-10-07 ENCOUNTER — Ambulatory Visit (INDEPENDENT_AMBULATORY_CARE_PROVIDER_SITE_OTHER): Payer: Medicare HMO | Admitting: Family Medicine

## 2021-10-07 VITALS — BP 130/70 | HR 93 | Temp 98.0°F | Ht 70.0 in | Wt 244.0 lb

## 2021-10-07 DIAGNOSIS — R0600 Dyspnea, unspecified: Secondary | ICD-10-CM

## 2021-10-07 DIAGNOSIS — E119 Type 2 diabetes mellitus without complications: Secondary | ICD-10-CM

## 2021-10-07 DIAGNOSIS — I10 Essential (primary) hypertension: Secondary | ICD-10-CM

## 2021-10-07 DIAGNOSIS — N1832 Chronic kidney disease, stage 3b: Secondary | ICD-10-CM

## 2021-10-07 NOTE — Progress Notes (Signed)
Subjective:    Patient ID: Juan [name], male    DOB: 10-14-1940, 81 y.o.   MRN: [id number] Patient is here today for a checkup.  He denies any chest pain.  He does have dyspnea on exertion however he states that this is a chronic problem and has not been progressive.  He believes a lot of this is due to deconditioning.  He had a stress test performed in 2019 that showed no ischemia.  He had an echocardiogram in 2021 that showed an ejection fraction of 65%.  He denies any orthopnea or paroxysmal nocturnal dyspnea.  He did have a chest x-ray that showed increased interstitial lung markings.  This was also seen at the base of his lungs on CT angiogram of the abdomen and pelvis.  We discussed interstitial lung disease.  He denies any cough or pleurisy or fever or chills.  He states that his shortness of breath has been stable.  His most recent lab work is listed below Lab on 10/04/2021  Component Date Value Ref Range Status   WBC 10/04/2021 6.0  3.8 - 10.8 Thousand/uL Final   RBC 10/04/2021 3.64 (L)  4.20 - 5.80 Million/uL Final   Hemoglobin 10/04/2021 11.5 (L)  13.2 - 17.1 g/dL Final   HCT 10/04/2021 33.4 (L)  38.5 - 50.0 % Final   MCV 10/04/2021 91.8  80.0 - 100.0 fL Final   MCH 10/04/2021 31.6  27.0 - 33.0 pg Final   MCHC 10/04/2021 34.4  32.0 - 36.0 g/dL Final   RDW 10/04/2021 13.5  11.0 - 15.0 % Final   Platelets 10/04/2021 110 (L)  140 - 400 Thousand/uL Final   MPV 10/04/2021 9.8  7.5 - 12.5 fL Final   Neutro Abs 10/04/2021 3,750  1,500 - 7,800 cells/uL Final   Lymphs Abs 10/04/2021 1,488  850 - 3,900 cells/uL Final   Absolute Monocytes 10/04/2021 606  200 - 950 cells/uL Final   Eosinophils Absolute 10/04/2021 138  15 - 500 cells/uL Final   Basophils Absolute 10/04/2021 18  0 - 200 cells/uL Final   Neutrophils Relative % 10/04/2021 62.5  % Final   Total Lymphocyte 10/04/2021 24.8  % Final   Monocytes Relative 10/04/2021 10.1  % Final   Eosinophils Relative 10/04/2021 2.3  % Final    Basophils Relative 10/04/2021 0.3  % Final   Cholesterol 10/04/2021 116  <200 mg/dL Final   HDL 10/04/2021 39 (L)  > OR = 40 mg/dL Final   Triglycerides 10/04/2021 119  <150 mg/dL Final   LDL Cholesterol (Calc) 10/04/2021 56  mg/dL (calc) Final   Comment: Reference range: <100 . Desirable range <100 mg/dL for primary prevention;   <70 mg/dL for patients with CHD or diabetic patients  with > or = 2 CHD risk factors. Marland Kitchen LDL-C is now calculated using the Martin-Hopkins  calculation, which is a validated novel method providing  better accuracy than the Friedewald equation in the  estimation of LDL-C.  Cresenciano Genre et al. Annamaria Helling. 2458;099(83): 2061-2068  (http://education.[facility].com/faq/FAQ164)    Total CHOL/HDL Ratio 10/04/2021 3.0  <5.0 (calc) Final   Non-HDL Cholesterol (Calc) 10/04/2021 77  <130 mg/dL (calc) Final   Comment: For patients with diabetes plus 1 major ASCVD risk  factor, treating to a non-HDL-C goal of <100 mg/dL  (LDL-C of <70 mg/dL) is considered a therapeutic  option.    Hgb A1c MFr Bld 10/04/2021 6.7 (H)  <5.7 % of total Hgb Final   Comment: For someone without known diabetes,  a hemoglobin A1c value of 6.5% or greater indicates that they may have  diabetes and this should be confirmed with a follow-up  test. . For someone with known diabetes, a value <7% indicates  that their diabetes is well controlled and a value  greater than or equal to 7% indicates suboptimal  control. A1c targets should be individualized based on  duration of diabetes, age, comorbid conditions, and  other considerations. . Currently, no consensus exists regarding use of hemoglobin A1c for diagnosis of diabetes for children. .    Mean Plasma Glucose 10/04/2021 146  mg/dL Final   eAG (mmol/L) 10/04/2021 8.1  mmol/L Final   Glucose, Bld 10/04/2021 141 (H)  65 - 99 mg/dL Final   Comment: .            Fasting reference interval . For someone without known diabetes, a glucose value  >125 mg/dL indicates that they may have diabetes and this should be confirmed with a follow-up test. .    BUN 10/04/2021 22  7 - 25 mg/dL Final   Creat 10/04/2021 1.77 (H)  0.70 - 1.22 mg/dL Final   BUN/Creatinine Ratio 10/04/2021 12  6 - 22 (calc) Final   Sodium 10/04/2021 141  135 - 146 mmol/L Final   Potassium 10/04/2021 4.8  3.5 - 5.3 mmol/L Final   Chloride 10/04/2021 108  98 - 110 mmol/L Final   CO2 10/04/2021 22  20 - 32 mmol/L Final   Calcium 10/04/2021 9.0  8.6 - 10.3 mg/dL Final   Total Protein 10/04/2021 6.1  6.1 - 8.1 g/dL Final   Albumin 10/04/2021 3.9  3.6 - 5.1 g/dL Final   Globulin 10/04/2021 2.2  1.9 - 3.7 g/dL (calc) Final   AG Ratio 10/04/2021 1.8  1.0 - 2.5 (calc) Final   Total Bilirubin 10/04/2021 0.4  0.2 - 1.2 mg/dL Final   Alkaline phosphatase (APISO) 10/04/2021 63  35 - 144 U/L Final   AST 10/04/2021 12  10 - 35 U/L Final   ALT 10/04/2021 13  9 - 46 U/L Final    Past Medical History:  Diagnosis Date   AAA (abdominal aortic aneurysm) ([place]) 8/12   3.5cm   CAD (coronary artery disease)    CKD (chronic kidney disease) stage 3, GFR 30-59 ml/min (HCC)    Colon polyps    Diabetes mellitus without complication (HCC)    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   Hyperlipidemia    Hypertension    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    50-69% (12/2015)   Neuromuscular disorder ([place])    L2-3,L5-S1 bulging disc /nerve impingement   PAD (peripheral artery disease) ([place])    Past Surgical History:  Procedure Laterality Date   COLONOSCOPY N/A 09/27/2012   Procedure: COLONOSCOPY;  Surgeon: Daneil Dolin, MD;  Location: [facility];  Service: Endoscopy;  Laterality: N/A;  9:30   CORONARY ANGIOPLASTY     1989   TONSILLECTOMY     Current Outpatient Medications on File Prior to Visit  Medication Sig Dispense Refill   allopurinol (ZYLOPRIM) 300 MG tablet TAKE 1 TABLET EVERY DAY 90 tablet 3   aspirin EC 81 MG tablet Take 81 mg  by mouth daily.     Blood Glucose Monitoring Suppl (TRUE METRIX METER) w/Device KIT Use as Directed 1 kit 1   diltiazem (CARDIZEM CD) 240 MG 24 hr capsule TAKE 1 CAPSULE EVERY DAY 90 capsule 1   doxazosin (CARDURA) 8  MG tablet TAKE 1 TABLET EVERY DAY 30 tablet 0   Dulaglutide (TRULICITY) 1.5 IR/5.1OA SOPN Inject 1.5 mg into the skin once a week. Begin after 0.20m completed. 4 mL 11   furosemide (LASIX) 40 MG tablet Take 1 tablet (40 mg total) by mouth daily. 30 tablet 3   glipiZIDE (GLUCOTROL) 5 MG tablet TAKE 1 TABLET TWICE DAILY BEFORE MEALS 60 tablet 0   glucose blood (TRUE METRIX BLOOD GLUCOSE TEST) test strip 1 each by Other route daily. DX: E11.9 100 strip 5   ketorolac (ACULAR) 0.5 % ophthalmic solution Place 1 drop into the left eye 2 (two) times daily. 5 mL 4   Lancet Devices (PRODIGY LANCING DEVICE) MISC Use bid DX e11.9 1 each 2   lisinopril (ZESTRIL) 40 MG tablet TAKE 1 TABLET EVERY DAY 90 tablet 3   metoprolol succinate (TOPROL-XL) 50 MG 24 hr tablet Take 1 tablet (50 mg total) by mouth daily. Take with or immediately following a meal. 90 tablet 3   Multiple Vitamin (MULTIVITAMIN WITH MINERALS) TABS Take 1 tablet by mouth daily.     Omega-3 Fatty Acids (FISH OIL PO) Take by mouth.     pantoprazole (PROTONIX) 20 MG tablet TAKE 1 TABLET EVERY DAY 90 tablet 3   potassium chloride SA (KLOR-CON) 20 MEQ tablet Take 1 tablet (20 mEq total) by mouth daily. 90 tablet 3   rOPINIRole (REQUIP) 0.5 MG tablet Take 1 tablet (0.5 mg total) by mouth at bedtime. 30 tablet 3   rosuvastatin (CRESTOR) 20 MG tablet TAKE 1 TABLET EVERY DAY 90 tablet 1   TRUEplus Lancets 33G MISC TEST BLOOD SUGAR EVERY DAY 100 each 3   No current facility-administered medications on file prior to visit.   Allergies  Allergen Reactions   Jardiance [Empagliflozin]     Severe nausea-    Social History   Socioeconomic History   Marital status: Married    Spouse name: Not on file   Number of children: Not on file    Years of education: Not on file   Highest education level: Not on file  Occupational History   Occupation: retired    EFish farm manager RETIRED    Comment: [employer]   Tobacco Use   Smoking status: Former    Years: 35.00    Types: Cigarettes    Quit date: 01/02/1988    Years since quitting: 33.7   Smokeless tobacco: Never  Vaping Use   Vaping Use: Never used  Substance and Sexual Activity   Alcohol use: Not Currently    Comment: rare beer, no history of ETOH abuse   Drug use: No   Sexual activity: Not on file  Other Topics Concern   Not on file  Social History Narrative   Not on file   Social Determinants of Health   Financial Resource Strain: Not on file  Food Insecurity: Not on file  Transportation Needs: No Transportation Needs (09/17/2020)   PRAPARE - THydrologist(Medical): No    Lack of Transportation (Non-Medical): No  Physical Activity: Not on file  Stress: Not on file  Social Connections: Not on file  Intimate Partner Violence: Not on file   Family History  Problem Relation Age of Onset   Diabetes Mother    Heart disease Mother        After age 83  Heart attack Mother    Hypertension Mother    Diabetes Father    Heart disease Father  After age 7   Hypertension Father    Heart attack Father    Brain cancer Sister    Cancer Sister        Brain   Diabetes Sister    Hypertension Sister    Cancer Brother        Chest Tumor   Diabetes Brother    Hypertension Brother    Deep vein thrombosis Sister    Diabetes Sister    Diabetes Brother        Bilateral leg   Colon cancer Neg Hx       Review of Systems  All other systems reviewed and are negative.      Objective:   Physical Exam Vitals reviewed.  Constitutional:      General: He is not in acute distress.    Appearance: He is well-developed. He is not diaphoretic.  HENT:     Head: Normocephalic and atraumatic.  Neck:     Thyroid: No thyromegaly.      Vascular: No JVD.     Trachea: No tracheal deviation.  Cardiovascular:     Rate and Rhythm: Normal rate and regular rhythm.     Heart sounds: Murmur heard.     No friction rub. No gallop.  Pulmonary:     Effort: Pulmonary effort is normal. No respiratory distress.     Breath sounds: Normal breath sounds. No stridor. No wheezing or rales.  Chest:     Chest wall: No tenderness.  Abdominal:     General: Bowel sounds are normal.     Palpations: Abdomen is soft.  Neurological:     Mental Status: He is alert and oriented to person, place, and time.     Cranial Nerves: No cranial nerve deficit.     Motor: No abnormal muscle tone.     Coordination: Coordination normal.     Deep Tendon Reflexes: Reflexes are normal and symmetric.  Psychiatric:        Behavior: Behavior normal.        Thought Content: Thought content normal.        Judgment: Judgment normal.           Assessment & Plan:  Diabetes mellitus without complication (HCC)  Essential hypertension  Stage 3b chronic kidney disease (HCC)  Dyspnea, unspecified type Diabetes is adequately controlled.  Blood pressure is excellent.  Kidney function slightly worse.  Have asked him to reduce his dose of Lasix to 20 mg a day.  Cholesterol is excellent with an LDL cholesterol well below goal.  We discussed dyspnea the majority of his visit.  At the present time he does not have any angina or chest pain.  I am concerned the patient may have interstitial lung disease.  We discussed proceeding with a high-res CT scan of the lung to evaluate for interstitial lung disease.  At the present time, the patient declines doing this.  We will monitor this clinically.  Certainly if he develops chest pain on him to notify me immediately

## 2021-11-01 DIAGNOSIS — Z7984 Long term (current) use of oral hypoglycemic drugs: Secondary | ICD-10-CM | POA: Diagnosis not present

## 2021-11-01 DIAGNOSIS — Z961 Presence of intraocular lens: Secondary | ICD-10-CM | POA: Diagnosis not present

## 2021-11-01 DIAGNOSIS — E119 Type 2 diabetes mellitus without complications: Secondary | ICD-10-CM | POA: Diagnosis not present

## 2021-11-01 DIAGNOSIS — H353132 Nonexudative age-related macular degeneration, bilateral, intermediate dry stage: Secondary | ICD-10-CM | POA: Diagnosis not present

## 2021-11-01 DIAGNOSIS — H35371 Puckering of macula, right eye: Secondary | ICD-10-CM | POA: Diagnosis not present

## 2021-11-01 DIAGNOSIS — Z8669 Personal history of other diseases of the nervous system and sense organs: Secondary | ICD-10-CM | POA: Diagnosis not present

## 2021-11-01 LAB — HM DIABETES EYE EXAM

## 2021-11-03 DIAGNOSIS — H35069 Retinal vasculitis, unspecified eye: Secondary | ICD-10-CM | POA: Diagnosis not present

## 2021-11-03 DIAGNOSIS — M316 Other giant cell arteritis: Secondary | ICD-10-CM | POA: Diagnosis not present

## 2021-11-04 DIAGNOSIS — H35033 Hypertensive retinopathy, bilateral: Secondary | ICD-10-CM | POA: Diagnosis not present

## 2021-11-04 DIAGNOSIS — H43813 Vitreous degeneration, bilateral: Secondary | ICD-10-CM | POA: Diagnosis not present

## 2021-11-04 DIAGNOSIS — H47391 Other disorders of optic disc, right eye: Secondary | ICD-10-CM | POA: Diagnosis not present

## 2021-11-09 ENCOUNTER — Other Ambulatory Visit: Payer: Self-pay | Admitting: Family Medicine

## 2021-11-09 NOTE — Telephone Encounter (Signed)
Requested Prescriptions  Pending Prescriptions Disp Refills  . glipiZIDE (GLUCOTROL) 5 MG tablet [Pharmacy Med Name: GLIPIZIDE 5 MG Tablet] 120 tablet 0    Sig: TAKE 1 TABLET TWICE DAILY BEFORE MEALS     Endocrinology:  Diabetes - Sulfonylureas Failed - 11/09/2021 11:33 AM      Failed - Cr in normal range and within 360 days    Creat  Date Value Ref Range Status  10/04/2021 1.77 (H) 0.70 - 1.22 mg/dL Final   Creatinine, Urine  Date Value Ref Range Status  05/14/2020 116 20 - 320 mg/dL Final         Failed - Valid encounter within last 6 months    Recent Outpatient Visits          8 months ago Diabetes mellitus without complication ([place])   [place] Pickard, Cammie Mcgee, MD   11 months ago Irregular heart beat   [place] Pickard, Cammie Mcgee, MD   1 year ago Essential hypertension   [place], Cammie Mcgee, MD   1 year ago Iron deficiency anemia, unspecified iron deficiency anemia type   [place] Susy Frizzle, MD   2 years ago Shortness of breath   [place], Cammie Mcgee, MD      Future Appointments            In 1 month Pickard, Cammie Mcgee, MD [place]           Passed - HBA1C is between 0 and 7.9 and within 180 days    Hgb A1c MFr Bld  Date Value Ref Range Status  10/04/2021 6.7 (H) <5.7 % of total Hgb Final    Comment:    For someone without known diabetes, a hemoglobin A1c value of 6.5% or greater indicates that they may have  diabetes and this should be confirmed with a follow-up  test. . For someone with known diabetes, a value <7% indicates  that their diabetes is well controlled and a value  greater than or equal to 7% indicates suboptimal  control. A1c targets should be individualized based on  duration of diabetes, age, comorbid conditions, and  other considerations. . Currently, no consensus exists regarding use of hemoglobin  A1c for diagnosis of diabetes for children. Marland Kitchen

## 2021-11-17 ENCOUNTER — Encounter: Payer: Self-pay | Admitting: Family Medicine

## 2021-11-17 DIAGNOSIS — H35033 Hypertensive retinopathy, bilateral: Secondary | ICD-10-CM | POA: Diagnosis not present

## 2021-11-17 DIAGNOSIS — H43813 Vitreous degeneration, bilateral: Secondary | ICD-10-CM | POA: Diagnosis not present

## 2021-11-17 DIAGNOSIS — H53002 Unspecified amblyopia, left eye: Secondary | ICD-10-CM | POA: Diagnosis not present

## 2021-11-17 DIAGNOSIS — H47011 Ischemic optic neuropathy, right eye: Secondary | ICD-10-CM | POA: Diagnosis not present

## 2021-11-20 ENCOUNTER — Ambulatory Visit (INDEPENDENT_AMBULATORY_CARE_PROVIDER_SITE_OTHER): Payer: Medicare HMO

## 2021-11-20 ENCOUNTER — Encounter: Payer: Self-pay | Admitting: Emergency Medicine

## 2021-11-20 ENCOUNTER — Ambulatory Visit
Admission: EM | Admit: 2021-11-20 | Discharge: 2021-11-20 | Disposition: A | Discharge status: Home / Self Care | Payer: Medicare HMO | Attending: Physician Assistant | Admitting: Physician Assistant

## 2021-11-20 DIAGNOSIS — J189 Pneumonia, unspecified organism: Secondary | ICD-10-CM

## 2021-11-20 DIAGNOSIS — R509 Fever, unspecified: Secondary | ICD-10-CM

## 2021-11-20 DIAGNOSIS — R059 Cough, unspecified: Secondary | ICD-10-CM | POA: Diagnosis not present

## 2021-11-20 MED ORDER — DOXYCYCLINE HYCLATE 100 MG PO CAPS: 100.0000 mg | ORAL_CAPSULE | Freq: Two times a day (BID) | ORAL | 0 refills | Status: DC

## 2021-11-20 MED ORDER — CEFPODOXIME PROXETIL 200 MG PO TABS: 200.0000 mg | ORAL_TABLET | Freq: Two times a day (BID) | ORAL | 0 refills | Status: DC

## 2021-11-20 MED ORDER — BENZONATATE 100 MG PO CAPS: 100.0000 mg | ORAL_CAPSULE | Freq: Three times a day (TID) | ORAL | 0 refills | Status: DC

## 2021-11-20 NOTE — ED Provider Notes (Signed)
[facility]    CSN: [id number] Arrival date & time: 11/20/21  1419      History   Chief Complaint Chief Complaint  Patient presents with   Chest Congestion    HPI Juan [name] is a 81 y.o. male.   Patient presents today with a 5 to 6-day history of URI symptoms.  Reports fever, productive cough, sinus congestion.  Denies any chest pain, shortness of breath increase in baseline, nausea/vomiting, weakness.  He has been using Mucinex and Tylenol without improvement of symptoms.  Denies any known sick contacts.  He has had COVID with last episode approximately a year ago.  He is up-to-date on COVID-19 vaccines.  Denies any recent antibiotic or steroids.  He is having difficulty with daily duties as result of symptoms.  He does have several risk factors for severe disease including cardiovascular disease, chronic kidney disease, diabetes.  He is having difficulty sleeping at night as result of symptoms.    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    50-69% (12/2015)   Neuromuscular disorder ([place])    L2-3,L5-S1 bulging disc /nerve impingement   PAD (peripheral artery disease) ([place])     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/25/2020   Restless legs syndrome (RLS) 05/25/2020   Retinal telangiectasis, 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   OSA (obstructive sleep apnea) 09/18/2019   Cardiac arrhythmia 08/21/2019   Cystoid macular edema of right eye 07/14/2019    Vitreomacular adhesion of left eye 07/14/2019   Cystoid macular edema of left eye 07/14/2019   Vitreomacular adhesion of both eyes 07/14/2019   Retinopathy due to secondary diabetes mellitus, with macular edema, 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[place]) 07/03/2019   CAD S/P percutaneous coronary angioplasty 07/03/2019   CKD (chronic kidney disease) stage 3, GFR 30-59 ml/min (HCC)    Hypertension 05/09/2017   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    PAD (peripheral artery disease) ([place])    Personal history of colonic polyps 09/09/2012   Diabetes mellitus without complication (HCC)    Hyperlipidemia    Neuromuscular disorder ([place])    CAD (coronary artery disease)    AAA (abdominal aortic aneurysm) ([place]) 01/02/2012    Past Surgical History:  Procedure Laterality Date   COLONOSCOPY N/A 09/27/2012   Procedure: COLONOSCOPY;  Surgeon: Daneil Dolin, MD;  [facility];  Service: Endoscopy;  Laterality: N/A;  9:30   CORONARY ANGIOPLASTY     1989   TONSILLECTOMY         Home Medications    Prior to Admission medications   Medication Sig Start Date End Date Taking? Authorizing Provider  allopurinol (ZYLOPRIM) 300 MG tablet TAKE 1 TABLET EVERY DAY 11/08/20  Yes Susy Frizzle, MD  aspirin EC 81 MG tablet Take 81 mg by mouth daily.   Yes [provider]  benzonatate (TESSALON) 100 MG capsule Take 1 capsule (100 mg total) by  mouth every 8 (eight) hours. 11/20/21  Yes Jaskarn Schweer, Junie Panning K, PA-C  Blood Glucose Monitoring Suppl (TRUE METRIX METER) w/Device KIT Use as Directed 07/18/16  Yes Susy Frizzle, MD  cefpodoxime (VANTIN) 200 MG tablet Take 1 tablet (200 mg total) by mouth 2 (two) times daily. 11/20/21  Yes Bethzy Hauck K, PA-C  diltiazem (CARDIZEM CD) 240 MG 24 hr capsule TAKE 1 CAPSULE EVERY DAY 05/24/21  Yes Susy Frizzle, MD  doxazosin (CARDURA) 8 MG tablet TAKE 1 TABLET EVERY DAY 08/19/21  Yes Susy Frizzle, MD  doxycycline (VIBRAMYCIN) 100 MG capsule  Take 1 capsule (100 mg total) by mouth 2 (two) times daily. 11/20/21  Yes Ova Gillentine K, PA-C  Dulaglutide (TRULICITY) 1.5 SM/2.7MB SOPN Inject 1.5 mg into the skin once a week. Begin after 0.28m completed. 01/12/20  Yes PSusy Frizzle MD  glipiZIDE (GLUCOTROL) 5 MG tablet TAKE 1 TABLET TWICE DAILY BEFORE MEALS 11/09/21  Yes PSusy Frizzle MD  glucose blood (TRUE METRIX BLOOD GLUCOSE TEST) test strip 1 each by Other route daily. DX: E11.9 06/06/21  Yes PSusy Frizzle MD  ketorolac (ACULAR) 0.5 % ophthalmic solution Place 1 drop into the left eye 2 (two) times daily. 05/31/20  Yes Rankin, GClent Demark MD  Lancet Devices (PRODIGY LANCING DEVICE) MISC Use bid DX e11.9 07/05/16  Yes PSusy Frizzle MD  lisinopril (ZESTRIL) 40 MG tablet TAKE 1 TABLET EVERY DAY 11/08/20  Yes PSusy Frizzle MD  metoprolol succinate (TOPROL-XL) 50 MG 24 hr tablet Take 1 tablet (50 mg total) by mouth daily. Take with or immediately following a meal. 02/28/21  Yes Pickard, WCammie Mcgee MD  Multiple Vitamin (MULTIVITAMIN WITH MINERALS) TABS Take 1 tablet by mouth daily.   Yes [provider]  Omega-3 Fatty Acids (FISH OIL PO) Take by mouth.   Yes [provider]  pantoprazole (PROTONIX) 20 MG tablet TAKE 1 TABLET EVERY DAY 11/01/20  Yes PSusy Frizzle MD  potassium chloride SA (KLOR-CON) 20 MEQ tablet Take 1 tablet (20 mEq total) by mouth daily. 02/16/20  Yes PSusy Frizzle MD  rOPINIRole (REQUIP) 0.5 MG tablet Take 1 tablet (0.5 mg total) by mouth at bedtime. 05/25/20  Yes Dohmeier, CAsencion Partridge MD  rosuvastatin (CRESTOR) 20 MG tablet TAKE 1 TABLET EVERY DAY 06/27/21  Yes PSusy Frizzle MD  TRUEplus Lancets 33G MISC TEST BLOOD SUGAR EVERY DAY 08/19/21  Yes PSusy Frizzle MD    Family History Family History  Problem Relation Age of Onset   Diabetes Mother    Heart disease Mother        After age 83  Heart attack Mother    Hypertension Mother    Diabetes Father    Heart disease Father         After age 83  Hypertension Father    Heart attack Father    Brain cancer Sister    Cancer Sister        Brain   Diabetes Sister    Hypertension Sister    Cancer Brother        Chest Tumor   Diabetes Brother    Hypertension Brother    Deep vein thrombosis Sister    Diabetes Sister    Diabetes Brother        Bilateral leg   Colon cancer Neg Hx     Social History Social History   Tobacco Use   Smoking status: Former    Years: 35.00  Types: Cigarettes    Quit date: 01/02/1988    Years since quitting: 33.9   Smokeless tobacco: Never  Vaping Use   Vaping Use: Never used  Substance Use Topics   Alcohol use: Not Currently    Comment: rare beer, no history of ETOH abuse   Drug use: No     Allergies   Jardiance [empagliflozin]   Review of Systems Review of Systems  Constitutional:  Positive for activity change, fatigue and fever. Negative for appetite change.  HENT:  Positive for congestion. Negative for sinus pressure, sneezing and sore throat.   Respiratory:  Positive for cough. Negative for shortness of breath.   Cardiovascular:  Negative for chest pain.  Gastrointestinal:  Negative for abdominal pain, diarrhea, nausea and vomiting.  Neurological:  Negative for dizziness, light-headedness and headaches.     Physical Exam Triage Vital Signs ED Triage Vitals  Enc Vitals Group     BP 11/20/21 1519 119/64     Pulse Rate 11/20/21 1519 76     Resp 11/20/21 1519 18     Temp 11/20/21 1519 98.2 F (36.8 C)     Temp Source 11/20/21 1519 Oral     SpO2 11/20/21 1519 97 %     Weight 11/20/21 1521 244 lb (110.7 kg)     Height 11/20/21 1521 '5\' 10"'  (1.778 m)     Head Circumference --      Peak Flow --      Pain Score 11/20/21 1521 0     Pain Loc --      Pain Edu? --      Excl. in [place]? --    No data found.  Updated Vital Signs BP 119/64 (BP Location: Right Arm)   Pulse 76   Temp 98.2 F (36.8 C) (Oral)   Resp 18   Ht '5\' 10"'  (1.778 m)   Wt 244 lb (110.7  kg)   SpO2 97%   BMI 35.01 kg/m   Visual Acuity Right Eye Distance:   Left Eye Distance:   Bilateral Distance:    Right Eye Near:   Left Eye Near:    Bilateral Near:     Physical Exam Vitals reviewed.  Constitutional:      General: He is awake.     Appearance: Normal appearance. He is well-developed. He is not ill-appearing.     Comments: Very pleasant male appears stated age in no acute distress sitting comfortably in exam room  HENT:     Head: Normocephalic and atraumatic.     Right Ear: Tympanic membrane, ear canal and external ear normal. Tympanic membrane is not erythematous or bulging.     Left Ear: Tympanic membrane, ear canal and external ear normal. Tympanic membrane is not erythematous or bulging.     Nose: Nose normal.     Mouth/Throat:     Pharynx: Uvula midline. No oropharyngeal exudate or posterior oropharyngeal erythema.  Cardiovascular:     Rate and Rhythm: Normal rate and regular rhythm.     Heart sounds: No murmur heard. Pulmonary:     Effort: Pulmonary effort is normal. No accessory muscle usage or respiratory distress.     Breath sounds: No stridor. Examination of the right-lower field reveals rhonchi. Examination of the left-lower field reveals rhonchi. Rhonchi present. No wheezing or rales.     Comments: Scattered rhonchi throughout lower lung fields Neurological:     Mental Status: He is alert.  Psychiatric:        Behavior: Behavior  is cooperative.      UC Treatments / Results  Labs (all labs ordered are listed, but only abnormal results are displayed) Labs Reviewed - No data to display  EKG   Radiology DG Chest 2 View  Result Date: 11/20/2021 CLINICAL DATA:  Cough, fever EXAM: CHEST - 2 VIEW COMPARISON:  11/24/2019 FINDINGS: Stable cardiomediastinal contours. Chronically coarsened interstitial markings, which appears slightly increased in the right upper lobe. No focal airspace consolidation. No pleural effusion or pneumothorax.  IMPRESSION: Chronically coarsened interstitial markings, which appears slightly increased in the right upper lobe. Developing infection not excluded. Electronically Signed   By: Davina Poke D.O.   On: 11/20/2021 15:57    Procedures Procedures (including critical care time)  Medications Ordered in UC Medications - No data to display  Initial Impression / Assessment and Plan / UC Course  I have reviewed the triage vital signs and the nursing notes.  Pertinent labs & imaging results that were available during my care of the patient were reviewed by me and considered in my medical decision making (see chart for details).     Patient is well-appearing, afebrile, nontoxic, nontachycardic.  He has been sick for more than 5 days and is outside the window of effectiveness for antiviral therapy so viral testing was deferred.  X-ray was obtained given adventitious sounds on exam which showed potential developing pneumonia.  We will start Vantin and doxycycline to cover for CAP.  No indication for dose adjustment based on metabolic panel from 11/07/32 with creatinine of 1.77 and calculated creatinine clearance of 49.5 mL/min.  Can use Mucinex, Tylenol, Flonase for additional symptom relief.  He was given Tessalon for cough.  Recommended he rest and drink plenty fluid.  Discussed that typically anyone over the age of 33 would need to go to the emergency room to consider admission for pneumonia, however, given stable vital signs we will start outpatient treatment.  Recommend he follow-up with his primary care provider tomorrow for reevaluation.  Discussed that if anything worsens and he develops any fever, chest pain, shortness of breath, weakness, nausea/vomiting he needs to go immediately to the emergency room to which she expressed understanding.  All questions answered to patient's satisfaction.  Final Clinical Impressions(s) / UC Diagnoses   Final diagnoses:  Community acquired pneumonia of right  upper lobe of lung     Discharge Instructions      Your x-ray showed some haziness in your right upper lobe which could be a sign of a developing pneumonia.  We are going to treat as if it is a pneumonia.  Please take Vantin twice daily and doxycycline twice daily.  Stay out of the sun while on this medication as it can cause photosensitivity.  Use Tessalon for cough.  Continue your Mucinex and Tylenol.  Make sure you rest and drink plenty of fluid.  You should follow-up with your primary care provider tomorrow.  If at any point anything worsens you must go to the hospital immediately as we discussed.     ED Prescriptions     Medication Sig Dispense Auth. Provider   cefpodoxime (VANTIN) 200 MG tablet Take 1 tablet (200 mg total) by mouth 2 (two) times daily. 20 tablet Jaliza Seifried K, PA-C   doxycycline (VIBRAMYCIN) 100 MG capsule Take 1 capsule (100 mg total) by mouth 2 (two) times daily. 20 capsule Gianlucca Szymborski K, PA-C   benzonatate (TESSALON) 100 MG capsule Take 1 capsule (100 mg total) by mouth every 8 (eight)  hours. 21 capsule Argus Caraher K, PA-C      PDMP not reviewed this encounter.   Terrilee Croak, PA-C 11/20/21 1605

## 2021-11-20 NOTE — Discharge Instructions (Signed)
Your x-ray showed some haziness in your right upper lobe which could be a sign of a developing pneumonia.  We are going to treat as if it is a pneumonia.  Please take Vantin twice daily and doxycycline twice daily.  Stay out of the sun while on this medication as it can cause photosensitivity.  Use Tessalon for cough.  Continue your Mucinex and Tylenol.  Make sure you rest and drink plenty of fluid.  You should follow-up with your primary care provider tomorrow.  If at any point anything worsens you must go to the hospital immediately as we discussed.

## 2021-11-20 NOTE — ED Triage Notes (Signed)
Patient c/o chest congestion, fever and productive cough x 4 days.  Patient has been taken Mucinex and Tylenol.

## 2021-11-27 ENCOUNTER — Other Ambulatory Visit: Payer: Self-pay | Admitting: Family Medicine

## 2021-11-28 NOTE — Telephone Encounter (Signed)
Last OV was 10/07/21, within  6 months. Will refill medications.  Requested Prescriptions  Pending Prescriptions Disp Refills  . metoprolol succinate (TOPROL-XL) 50 MG 24 hr tablet [Pharmacy Med Name: METOPROLOL SUCCINATE ER 50 MG Tablet Extended Release 24 Hour] 90 tablet 0    Sig: TAKE 1 TABLET EVERY DAY. TAKE WITH OR IMMEDIATELY FOLLOWING A MEAL     Cardiovascular:  Beta Blockers Failed - 11/27/2021  1:33 PM      Failed - Valid encounter within last 6 months    Recent Outpatient Visits          9 months ago Diabetes mellitus without complication ([place])   [place] Pickard, Cammie Mcgee, MD   11 months ago Irregular heart beat   [place] Pickard, Cammie Mcgee, MD   1 year ago Essential hypertension   [place] Susy Frizzle, MD   1 year ago Iron deficiency anemia, unspecified iron deficiency anemia type   [place] Susy Frizzle, MD   2 years ago Shortness of breath   [place], Cammie Mcgee, MD      Future Appointments            In 2 weeks Dennard Schaumann, Cammie Mcgee, MD [place]           Passed - Last BP in normal range    BP Readings from Last 1 Encounters:  11/20/21 119/64         Passed - Last Heart Rate in normal range    Pulse Readings from Last 1 Encounters:  11/20/21 76         . doxazosin (CARDURA) 8 MG tablet [Pharmacy Med Name: DOXAZOSIN MESYLATE 8 MG Tablet] 60 tablet     Sig: TAKE 1 TABLET EVERY DAY     Cardiovascular:  Alpha Blockers Failed - 11/27/2021  1:33 PM      Failed - Valid encounter within last 6 months    Recent Outpatient Visits          9 months ago Diabetes mellitus without complication ([place])   [place] Pickard, Cammie Mcgee, MD   11 months ago Irregular heart beat   [place] Pickard, Cammie Mcgee, MD   1 year ago Essential hypertension   [place] Susy Frizzle, MD    1 year ago Iron deficiency anemia, unspecified iron deficiency anemia type   [place] Pickard, Cammie Mcgee, MD   2 years ago Shortness of breath   [place] Pickard, Cammie Mcgee, MD      Future Appointments            In 2 weeks Dennard Schaumann, Cammie Mcgee, MD [place], [facility]           Passed - Last BP in normal range    BP Readings from Last 1 Encounters:  11/20/21 119/64         . diltiazem (CARDIZEM CD) 240 MG 24 hr capsule [Pharmacy Med Name: DILTIAZEM HYDROCHLORIDE ER 240 MG Capsule Extended Release 24 Hour] 90 capsule 0    Sig: TAKE 1 CAPSULE EVERY DAY     Cardiovascular: Calcium Channel Blockers 3 Failed - 11/27/2021  1:33 PM      Failed - Cr in normal range and within 360 days    Creat  Date Value Ref Range Status  10/04/2021 1.77 (H)  0.70 - 1.22 mg/dL Final   Creatinine, Urine  Date Value Ref Range Status  05/14/2020 116 20 - 320 mg/dL Final         Failed - Valid encounter within last 6 months    Recent Outpatient Visits          9 months ago Diabetes mellitus without complication ([place])   [place] Pickard, Cammie Mcgee, MD   11 months ago Irregular heart beat   [place] Pickard, Cammie Mcgee, MD   1 year ago Essential hypertension   [place] Susy Frizzle, MD   1 year ago Iron deficiency anemia, unspecified iron deficiency anemia type   [place] Susy Frizzle, MD   2 years ago Shortness of breath   [place] Susy Frizzle, MD      Future Appointments            In 2 weeks Dennard Schaumann, Cammie Mcgee, MD [place]           Passed - ALT in normal range and within 360 days    ALT  Date Value Ref Range Status  10/04/2021 13 9 - 46 U/L Final         Passed - AST in normal range and within 360 days    AST  Date Value Ref Range Status  10/04/2021 12 10 - 35 U/L Final         Passed - Last  BP in normal range    BP Readings from Last 1 Encounters:  11/20/21 119/64         Passed - Last Heart Rate in normal range    Pulse Readings from Last 1 Encounters:  11/20/21 76         . glipiZIDE (GLUCOTROL) 5 MG tablet [Pharmacy Med Name: GLIPIZIDE 5 MG Tablet] 180 tablet     Sig: TAKE 1 [place]     Endocrinology:  Diabetes - Sulfonylureas Failed - 11/27/2021  1:33 PM      Failed - Cr in normal range and within 360 days    Creat  Date Value Ref Range Status  10/04/2021 1.77 (H) 0.70 - 1.22 mg/dL Final   Creatinine, Urine  Date Value Ref Range Status  05/14/2020 116 20 - 320 mg/dL Final         Failed - Valid encounter within last 6 months    Recent Outpatient Visits          9 months ago Diabetes mellitus without complication ([place])   [place] Susy Frizzle, MD   11 months ago Irregular heart beat   [place] Pickard, Cammie Mcgee, MD   1 year ago Essential hypertension   [place], Warren T, MD   1 year ago Iron deficiency anemia, unspecified iron deficiency anemia type   [place] Susy Frizzle, MD   2 years ago Shortness of breath   [place] Pickard, Cammie Mcgee, MD      Future Appointments            In 2 weeks Dennard Schaumann, Cammie Mcgee, MD [place]           Passed - HBA1C is between 0 and 7.9 and within 180 days    Hgb A1c MFr Bld  Date Value Ref Range Status  10/04/2021 6.7 (H) <5.7 % of total Hgb Final    Comment:    For someone without known diabetes, a hemoglobin A1c value of 6.5% or greater indicates that they may have  diabetes and this should be confirmed with a follow-up  test. . For someone with known diabetes, a value <7% indicates  that their diabetes is well controlled and a value  greater than or equal to 7% indicates suboptimal  control. A1c targets should be individualized based on   duration of diabetes, age, comorbid conditions, and  other considerations. . Currently, no consensus exists regarding use of hemoglobin A1c for diagnosis of diabetes for children. Marland Kitchen

## 2021-12-08 ENCOUNTER — Other Ambulatory Visit: Payer: Medicare HMO

## 2021-12-08 DIAGNOSIS — I2510 Atherosclerotic heart disease of native coronary artery without angina pectoris: Secondary | ICD-10-CM

## 2021-12-08 DIAGNOSIS — I10 Essential (primary) hypertension: Secondary | ICD-10-CM

## 2021-12-08 DIAGNOSIS — N1832 Chronic kidney disease, stage 3b: Secondary | ICD-10-CM

## 2021-12-08 DIAGNOSIS — Z79899 Other long term (current) drug therapy: Secondary | ICD-10-CM

## 2021-12-08 DIAGNOSIS — E119 Type 2 diabetes mellitus without complications: Secondary | ICD-10-CM | POA: Diagnosis not present

## 2021-12-08 DIAGNOSIS — E785 Hyperlipidemia, unspecified: Secondary | ICD-10-CM | POA: Diagnosis not present

## 2021-12-09 LAB — CBC WITH DIFFERENTIAL/PLATELET
Absolute Monocytes: 589 cells/uL (ref 200–950)
Basophils Absolute: 19 cells/uL (ref 0–200)
Basophils Relative: 0.3 %
Eosinophils Absolute: 149 cells/uL (ref 15–500)
Eosinophils Relative: 2.4 %
HCT: 32.8 % — ABNORMAL LOW (ref 38.5–50.0)
Hemoglobin: 11.1 g/dL — ABNORMAL LOW (ref 13.2–17.1)
Lymphs Abs: 1327 cells/uL (ref 850–3900)
MCH: 31.4 pg (ref 27.0–33.0)
MCHC: 33.8 g/dL (ref 32.0–36.0)
MCV: 92.9 fL (ref 80.0–100.0)
MPV: 10 fL (ref 7.5–12.5)
Monocytes Relative: 9.5 %
Neutro Abs: 4117 cells/uL (ref 1500–7800)
Neutrophils Relative %: 66.4 %
Platelets: 131 10*3/uL — ABNORMAL LOW (ref 140–400)
RBC: 3.53 10*6/uL — ABNORMAL LOW (ref 4.20–5.80)
RDW: 13.9 % (ref 11.0–15.0)
Total Lymphocyte: 21.4 %
WBC: 6.2 10*3/uL (ref 3.8–10.8)

## 2021-12-09 LAB — COMPREHENSIVE METABOLIC PANEL
AG Ratio: 1.5 (calc) (ref 1.0–2.5)
ALT: 19 U/L (ref 9–46)
AST: 16 U/L (ref 10–35)
Albumin: 3.8 g/dL (ref 3.6–5.1)
Alkaline phosphatase (APISO): 64 U/L (ref 35–144)
BUN/Creatinine Ratio: 14 (calc) (ref 6–22)
BUN: 22 mg/dL (ref 7–25)
CO2: 27 mmol/L (ref 20–32)
Calcium: 8.8 mg/dL (ref 8.6–10.3)
Chloride: 104 mmol/L (ref 98–110)
Creat: 1.52 mg/dL — ABNORMAL HIGH (ref 0.70–1.22)
Globulin: 2.5 g/dL (calc) (ref 1.9–3.7)
Glucose, Bld: 151 mg/dL — ABNORMAL HIGH (ref 65–99)
Potassium: 4.3 mmol/L (ref 3.5–5.3)
Sodium: 139 mmol/L (ref 135–146)
Total Bilirubin: 0.5 mg/dL (ref 0.2–1.2)
Total Protein: 6.3 g/dL (ref 6.1–8.1)

## 2021-12-09 LAB — LIPID PANEL
Cholesterol: 114 mg/dL (ref <200)
HDL: 35 mg/dL — ABNORMAL LOW (ref >=40)
LDL Cholesterol (Calc): 54 mg/dL (calc)
Non-HDL Cholesterol (Calc): 79 mg/dL (calc) (ref <130)
Total CHOL/HDL Ratio: 3.3 (calc) (ref <5.0)
Triglycerides: 171 mg/dL — ABNORMAL HIGH (ref <150)

## 2021-12-09 LAB — HEMOGLOBIN A1C
Hgb A1c MFr Bld: 6.8 % of total Hgb — ABNORMAL HIGH (ref <5.7)
Mean Plasma Glucose: 148 mg/dL
eAG (mmol/L): 8.2 mmol/L

## 2021-12-15 ENCOUNTER — Ambulatory Visit (INDEPENDENT_AMBULATORY_CARE_PROVIDER_SITE_OTHER): Payer: Medicare HMO | Admitting: Family Medicine

## 2021-12-15 VITALS — BP 132/70 | HR 76 | Ht 70.0 in | Wt 244.0 lb

## 2021-12-15 DIAGNOSIS — I2510 Atherosclerotic heart disease of native coronary artery without angina pectoris: Secondary | ICD-10-CM

## 2021-12-15 DIAGNOSIS — Z0000 Encounter for general adult medical examination without abnormal findings: Secondary | ICD-10-CM

## 2021-12-15 DIAGNOSIS — E119 Type 2 diabetes mellitus without complications: Secondary | ICD-10-CM | POA: Diagnosis not present

## 2021-12-15 DIAGNOSIS — N1832 Chronic kidney disease, stage 3b: Secondary | ICD-10-CM

## 2021-12-15 DIAGNOSIS — I10 Essential (primary) hypertension: Secondary | ICD-10-CM | POA: Diagnosis not present

## 2021-12-15 NOTE — Progress Notes (Signed)
Subjective:    Patient ID: Juan [name], male    DOB: [birth year], 81 y.o.   MRN: [id number]  Patient is a very pleasant 81 year old Caucasian gentleman here today for physical exam.  In November of last year, his aortic aneurysm was found to be 51 mm.  He has seen his vascular specialist every 6 months and this is pending at the present time due to his age she does not require colonoscopy or prostate cancer screening.  His blood pressure today is excellent at 132/70.  He denies any memory loss or depression or falls.  He is due for a flu shot.  Has had his COVID booster.  We also discussed RSV vaccination.  His pneumonia vaccine and shingles vaccine are up-to-date.  His most recent lab work is listed below and show stable chronic kidney disease and also stable and adequately controlled type 2 diabetes mellitus  Lab on 12/08/2021  Component Date Value Ref Range Status   Cholesterol 12/08/2021 114  <200 mg/dL Final   HDL 12/08/2021 35 (L)  > OR = 40 mg/dL Final   Triglycerides 12/08/2021 171 (H)  <150 mg/dL Final   LDL Cholesterol (Calc) 12/08/2021 54  mg/dL (calc) Final   Comment: Reference range: <100 . Desirable range <100 mg/dL for primary prevention;   <70 mg/dL for patients with CHD or diabetic patients  with > or = 2 CHD risk factors. Marland Kitchen LDL-C is now calculated using the Martin-Hopkins  calculation, which is a validated novel method providing  better accuracy than the Friedewald equation in the  estimation of LDL-C.  Cresenciano Genre et al. Annamaria Helling. 7564;332(95): 2061-2068  (http://education.[facility].com/faq/FAQ164)    Total CHOL/HDL Ratio 12/08/2021 3.3  <5.0 (calc) Final   Non-HDL Cholesterol (Calc) 12/08/2021 79  <130 mg/dL (calc) Final   Comment: For patients with diabetes plus 1 major ASCVD risk  factor, treating to a non-HDL-C goal of <100 mg/dL  (LDL-C of <70 mg/dL) is considered a therapeutic  option.    Hgb A1c MFr Bld 12/08/2021 6.8 (H)  <5.7 % of total Hgb Final    Comment: For someone without known diabetes, a hemoglobin A1c value of 6.5% or greater indicates that they may have  diabetes and this should be confirmed with a follow-up  test. . For someone with known diabetes, a value <7% indicates  that their diabetes is well controlled and a value  greater than or equal to 7% indicates suboptimal  control. A1c targets should be individualized based on  duration of diabetes, age, comorbid conditions, and  other considerations. . Currently, no consensus exists regarding use of hemoglobin A1c for diagnosis of diabetes for children. .    Mean Plasma Glucose 12/08/2021 148  mg/dL Final   eAG (mmol/L) 12/08/2021 8.2  mmol/L Final   WBC 12/08/2021 6.2  3.8 - 10.8 Thousand/uL Final   RBC 12/08/2021 3.53 (L)  4.20 - 5.80 Million/uL Final   Hemoglobin 12/08/2021 11.1 (L)  13.2 - 17.1 g/dL Final   HCT 12/08/2021 32.8 (L)  38.5 - 50.0 % Final   MCV 12/08/2021 92.9  80.0 - 100.0 fL Final   MCH 12/08/2021 31.4  27.0 - 33.0 pg Final   MCHC 12/08/2021 33.8  32.0 - 36.0 g/dL Final   RDW 12/08/2021 13.9  11.0 - 15.0 % Final   Platelets 12/08/2021 131 (L)  140 - 400 Thousand/uL Final   MPV 12/08/2021 10.0  7.5 - 12.5 fL Final   Neutro Abs 12/08/2021 4,117  1,500 - 7,800  cells/uL Final   Lymphs Abs 12/08/2021 1,327  850 - 3,900 cells/uL Final   Absolute Monocytes 12/08/2021 589  200 - 950 cells/uL Final   Eosinophils Absolute 12/08/2021 149  15 - 500 cells/uL Final   Basophils Absolute 12/08/2021 19  0 - 200 cells/uL Final   Neutrophils Relative % 12/08/2021 66.4  % Final   Total Lymphocyte 12/08/2021 21.4  % Final   Monocytes Relative 12/08/2021 9.5  % Final   Eosinophils Relative 12/08/2021 2.4  % Final   Basophils Relative 12/08/2021 0.3  % Final   Glucose, Bld 12/08/2021 151 (H)  65 - 99 mg/dL Final   Comment: .            Fasting reference interval . For someone without known diabetes, a glucose value >125 mg/dL indicates that they may  have diabetes and this should be confirmed with a follow-up test. .    BUN 12/08/2021 22  7 - 25 mg/dL Final   Creat 12/08/2021 1.52 (H)  0.70 - 1.22 mg/dL Final   BUN/Creatinine Ratio 12/08/2021 14  6 - 22 (calc) Final   Sodium 12/08/2021 139  135 - 146 mmol/L Final   Potassium 12/08/2021 4.3  3.5 - 5.3 mmol/L Final   Chloride 12/08/2021 104  98 - 110 mmol/L Final   CO2 12/08/2021 27  20 - 32 mmol/L Final   Calcium 12/08/2021 8.8  8.6 - 10.3 mg/dL Final   Total Protein 12/08/2021 6.3  6.1 - 8.1 g/dL Final   Albumin 12/08/2021 3.8  3.6 - 5.1 g/dL Final   Globulin 12/08/2021 2.5  1.9 - 3.7 g/dL (calc) Final   AG Ratio 12/08/2021 1.5  1.0 - 2.5 (calc) Final   Total Bilirubin 12/08/2021 0.5  0.2 - 1.2 mg/dL Final   Alkaline phosphatase (APISO) 12/08/2021 64  35 - 144 U/L Final   AST 12/08/2021 16  10 - 35 U/L Final   ALT 12/08/2021 19  9 - 46 U/L Final  Abstract on 11/17/2021  Component Date Value Ref Range Status   HM Diabetic Eye Exam 11/01/2021 No Retinopathy  No Retinopathy Final    Past Medical History:  Diagnosis Date   AAA (abdominal aortic aneurysm) ([place]) 8/12   3.5cm   CAD (coronary artery disease)    CKD (chronic kidney disease) stage 3, GFR 30-59 ml/min (HCC)    Colon polyps    Diabetes mellitus without complication (HCC)    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   Hyperlipidemia    Hypertension    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    50-69% (12/2015)   Neuromuscular disorder ([place])    L2-3,L5-S1 bulging disc /nerve impingement   PAD (peripheral artery disease) ([place])    Past Surgical History:  Procedure Laterality Date   COLONOSCOPY N/A 09/27/2012   Procedure: COLONOSCOPY;  Surgeon: Daneil Dolin, MD;  [facility];  Service: Endoscopy;  Laterality: N/A;  9:30   CORONARY ANGIOPLASTY     1989   TONSILLECTOMY     Current Outpatient Medications on File Prior to Visit  Medication Sig Dispense Refill    allopurinol (ZYLOPRIM) 300 MG tablet TAKE 1 TABLET EVERY DAY 90 tablet 3   aspirin EC 81 MG tablet Take 81 mg by mouth daily.     benzonatate (TESSALON) 100 MG capsule Take 1 capsule (100 mg total) by mouth every 8 (eight) hours. 21 capsule 0   Blood Glucose Monitoring Suppl (TRUE METRIX METER)  w/Device KIT Use as Directed 1 kit 1   cefpodoxime (VANTIN) 200 MG tablet Take 1 tablet (200 mg total) by mouth 2 (two) times daily. 20 tablet 0   diltiazem (CARDIZEM CD) 240 MG 24 hr capsule TAKE 1 CAPSULE EVERY DAY 90 capsule 0   doxazosin (CARDURA) 8 MG tablet TAKE 1 TABLET EVERY DAY 60 tablet 0   doxycycline (VIBRAMYCIN) 100 MG capsule Take 1 capsule (100 mg total) by mouth 2 (two) times daily. 20 capsule 0   Dulaglutide (TRULICITY) 1.5 FM/3.8GY SOPN Inject 1.5 mg into the skin once a week. Begin after 0.78m completed. 4 mL 11   glipiZIDE (GLUCOTROL) 5 MG tablet TAKE 1 TABLET TWICE DAILY BEFORE MEALS 180 tablet 0   glucose blood (TRUE METRIX BLOOD GLUCOSE TEST) test strip 1 each by Other route daily. DX: E11.9 100 strip 5   ketorolac (ACULAR) 0.5 % ophthalmic solution Place 1 drop into the left eye 2 (two) times daily. 5 mL 4   Lancet Devices (PRODIGY LANCING DEVICE) MISC Use bid DX e11.9 1 each 2   lisinopril (ZESTRIL) 40 MG tablet TAKE 1 TABLET EVERY DAY 90 tablet 3   metoprolol succinate (TOPROL-XL) 50 MG 24 hr tablet TAKE 1 TABLET EVERY DAY. TAKE WITH OR IMMEDIATELY FOLLOWING A MEAL 90 tablet 0   Multiple Vitamin (MULTIVITAMIN WITH MINERALS) TABS Take 1 tablet by mouth daily.     Omega-3 Fatty Acids (FISH OIL PO) Take by mouth.     pantoprazole (PROTONIX) 20 MG tablet TAKE 1 TABLET EVERY DAY 90 tablet 3   potassium chloride SA (KLOR-CON) 20 MEQ tablet Take 1 tablet (20 mEq total) by mouth daily. 90 tablet 3   rOPINIRole (REQUIP) 0.5 MG tablet Take 1 tablet (0.5 mg total) by mouth at bedtime. 30 tablet 3   rosuvastatin (CRESTOR) 20 MG tablet TAKE 1 TABLET EVERY DAY 90 tablet 1   TRUEplus Lancets  33G MISC TEST BLOOD SUGAR EVERY DAY 100 each 3   No current facility-administered medications on file prior to visit.   Allergies  Allergen Reactions   Jardiance [Empagliflozin]     Severe nausea-    Social History   Socioeconomic History   Marital status: Married    Spouse name: Not on file   Number of children: Not on file   Years of education: Not on file   Highest education level: Not on file  Occupational History   Occupation: retired    EFish farm manager RETIRED    Comment: [employer]   Tobacco Use   Smoking status: Former    Years: 35.00    Types: Cigarettes    Quit date: 01/02/1988    Years since quitting: 33.9   Smokeless tobacco: Never  Vaping Use   Vaping Use: Never used  Substance and Sexual Activity   Alcohol use: Not Currently    Comment: rare beer, no history of ETOH abuse   Drug use: No   Sexual activity: Not on file  Other Topics Concern   Not on file  Social History Narrative   Not on file   Social Determinants of Health   Financial Resource Strain: Not on file  Food Insecurity: Not on file  Transportation Needs: No Transportation Needs (09/17/2020)   PRAPARE - THydrologist(Medical): No    Lack of Transportation (Non-Medical): No  Physical Activity: Not on file  Stress: Not on file  Social Connections: Not on file  Intimate Partner Violence: Not on file  Family History  Problem Relation Age of Onset   Diabetes Mother    Heart disease Mother        After age 110   Heart attack Mother    Hypertension Mother    Diabetes Father    Heart disease Father        After age 31   Hypertension Father    Heart attack Father    Brain cancer Sister    Cancer Sister        Brain   Diabetes Sister    Hypertension Sister    Cancer Brother        Chest Tumor   Diabetes Brother    Hypertension Brother    Deep vein thrombosis Sister    Diabetes Sister    Diabetes Brother        Bilateral leg   Colon cancer Neg Hx        Review of Systems  All other systems reviewed and are negative.      Objective:   Physical Exam Vitals reviewed.  Constitutional:      General: He is not in acute distress.    Appearance: Normal appearance. He is well-developed. He is obese. He is not ill-appearing, toxic-appearing or diaphoretic.  HENT:     Head: Normocephalic and atraumatic.     Right Ear: External ear normal.     Left Ear: External ear normal.     Nose: Nose normal.     Mouth/Throat:     Pharynx: No oropharyngeal exudate.  Eyes:     General: No scleral icterus.       Right eye: No discharge.        Left eye: No discharge.     Conjunctiva/sclera: Conjunctivae normal.     Pupils: Pupils are equal, round, and reactive to light.  Neck:     Thyroid: No thyromegaly.     Vascular: No carotid bruit or JVD.     Trachea: No tracheal deviation.  Cardiovascular:     Rate and Rhythm: Normal rate. Rhythm irregular.     Heart sounds: Murmur heard.     No friction rub. No gallop.  Pulmonary:     Effort: Pulmonary effort is normal. No respiratory distress.     Breath sounds: Normal breath sounds. No stridor. No wheezing or rales.  Chest:     Chest wall: No tenderness.  Abdominal:     General: Bowel sounds are normal. There is no distension.     Palpations: Abdomen is soft. There is no mass.     Tenderness: There is no abdominal tenderness. There is no guarding or rebound.  Musculoskeletal:        General: No tenderness. Normal range of motion.     Cervical back: Normal range of motion and neck supple.     Right lower leg: Edema present.     Left lower leg: Edema present.  Lymphadenopathy:     Cervical: No cervical adenopathy.  Skin:    General: Skin is warm.     Coloration: Skin is not jaundiced or pale.     Findings: No bruising, erythema, lesion or rash.  Neurological:     Mental Status: He is alert and oriented to person, place, and time.     Cranial Nerves: No cranial nerve deficit.     Motor:  No abnormal muscle tone.     Coordination: Coordination normal.     Deep Tendon Reflexes: Reflexes are normal and symmetric.  Psychiatric:  Behavior: Behavior normal.        Thought Content: Thought content normal.        Judgment: Judgment normal.           Assessment & Plan:  General medical exam  Coronary artery disease involving native coronary artery of native heart without angina pectoris  Stage 3b chronic kidney disease ([place])  Essential hypertension  Controlled type 2 diabetes mellitus without complication, without long-term current use of insulin ([place]) AAA is being followed by vascular surgery.  He received his flu shot today.  COVID booster is up-to-date.  The remainder of his immunizations are up-to-date.  Blood pressure is excellent.  Kidney disease is stable.  Diabetes is well managed.  LDL cholesterol is well below his goal of 70.  He denies any falls or depression or memory loss.  Therefore his preventative care is up-to-date patient has neuropathy with mild numbness and burning and tingling in both feet

## 2021-12-27 ENCOUNTER — Other Ambulatory Visit: Payer: Self-pay | Admitting: Family Medicine

## 2021-12-27 NOTE — Telephone Encounter (Signed)
patient last OV was 12/14/21, will refill medication.  Requested Prescriptions  Pending Prescriptions Disp Refills  . TRULICITY 1.5 PP/5.0DT SOPN [Pharmacy Med Name: Trulicity Subcutaneous Solution Pen-injector 1.5 MG/0.5ML] 4 mL 0    Sig: INJECT 1.5 MG (0.5ML) UNDER THE SKIN ONCE A WEEK     Endocrinology:  Diabetes - GLP-1 Receptor Agonists Failed - 12/27/2021  8:14 AM      Failed - Valid encounter within last 6 months    Recent Outpatient Visits          10 months ago Diabetes mellitus without complication ([place])   [place] Pickard, Cammie Mcgee, MD   1 year ago Irregular heart beat   [place] Pickard, Cammie Mcgee, MD   1 year ago Essential hypertension   [place], Cammie Mcgee, MD   2 years ago Iron deficiency anemia, unspecified iron deficiency anemia type   [place] Susy Frizzle, MD   2 years ago Shortness of breath   [place] Pickard, Cammie Mcgee, MD             Passed - HBA1C is between 0 and 7.9 and within 180 days    Hgb A1c MFr Bld  Date Value Ref Range Status  12/08/2021 6.8 (H) <5.7 % of total Hgb Final    Comment:    For someone without known diabetes, a hemoglobin A1c value of 6.5% or greater indicates that they may have  diabetes and this should be confirmed with a follow-up  test. . For someone with known diabetes, a value <7% indicates  that their diabetes is well controlled and a value  greater than or equal to 7% indicates suboptimal  control. A1c targets should be individualized based on  duration of diabetes, age, comorbid conditions, and  other considerations. . Currently, no consensus exists regarding use of hemoglobin A1c for diagnosis of diabetes for children. Marland Kitchen

## 2022-01-02 ENCOUNTER — Other Ambulatory Visit: Payer: Self-pay | Admitting: Family Medicine

## 2022-01-25 ENCOUNTER — Other Ambulatory Visit: Payer: Self-pay | Admitting: Family Medicine

## 2022-02-08 ENCOUNTER — Ambulatory Visit (HOSPITAL_COMMUNITY)
Admission: RE | Admit: 2022-02-08 | Discharge: 2022-02-08 | Disposition: A | Discharge status: Home / Self Care | Payer: Medicare HMO | Source: Ambulatory Visit | Attending: Family Medicine | Admitting: Family Medicine

## 2022-02-08 ENCOUNTER — Ambulatory Visit (INDEPENDENT_AMBULATORY_CARE_PROVIDER_SITE_OTHER): Payer: Medicare HMO | Admitting: Family Medicine

## 2022-02-08 ENCOUNTER — Other Ambulatory Visit: Payer: Self-pay | Admitting: Family Medicine

## 2022-02-08 VITALS — BP 132/68 | HR 68 | Ht 70.0 in | Wt 251.0 lb

## 2022-02-08 DIAGNOSIS — M5431 Sciatica, right side: Secondary | ICD-10-CM

## 2022-02-08 DIAGNOSIS — M545 Low back pain, unspecified: Secondary | ICD-10-CM | POA: Diagnosis not present

## 2022-02-08 MED ORDER — HYDROCODONE-ACETAMINOPHEN 5-325 MG PO TABS: 1.0000 | ORAL_TABLET | Freq: Four times a day (QID) | ORAL | 0 refills | Status: DC | PRN

## 2022-02-08 MED ORDER — PREDNISONE 20 MG PO TABS: ORAL_TABLET | ORAL | 0 refills | Status: DC

## 2022-02-08 NOTE — Progress Notes (Signed)
Subjective:    Patient ID: Juan [name], male    DOB: 02-26-1941, 81 y.o.   MRN: [id number] Started 2 weeks ago, the patient developed nervelike pain radiating from his lower back into his right posterior hip and down his right lateral leg below his knee into his calf and ankle.  He has low back pain.  He denies any falls or specific injury.  He does have pain in his lower back with standing and walking.  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    50-69% (12/2015)   Neuromuscular disorder ([place])    L2-3,L5-S1 bulging disc /nerve impingement   PAD (peripheral artery disease) ([place])    Past Surgical History:  Procedure Laterality Date   COLONOSCOPY N/A 09/27/2012   Procedure: COLONOSCOPY;  Surgeon: Daneil Dolin, MD;  [facility];  Service: Endoscopy;  Laterality: N/A;  9:30   CORONARY ANGIOPLASTY     1989   TONSILLECTOMY     Current Outpatient Medications on File Prior to Visit  Medication Sig Dispense Refill   allopurinol (ZYLOPRIM) 300 MG tablet TAKE 1 TABLET EVERY DAY 90 tablet 10   aspirin EC 81 MG tablet Take 81 mg by mouth daily.     Blood Glucose Monitoring Suppl (TRUE METRIX METER) w/Device KIT Use as Directed 1 kit 1   diltiazem (CARDIZEM CD) 240 MG 24 hr capsule TAKE 1 CAPSULE EVERY DAY 90 capsule 0   doxazosin (CARDURA) 8 MG tablet TAKE 1 TABLET EVERY DAY 60 tablet 0   glipiZIDE (GLUCOTROL) 5 MG tablet TAKE 1 TABLET TWICE DAILY BEFORE MEALS 180 tablet 0   glucose blood (TRUE METRIX BLOOD GLUCOSE TEST) test strip 1 each by Other  route daily. DX: E11.9 100 strip 5   Lancet Devices (PRODIGY LANCING DEVICE) MISC Use bid DX e11.9 1 each 2   lisinopril (ZESTRIL) 40 MG tablet TAKE 1 TABLET EVERY DAY 90 tablet 10   metoprolol succinate (TOPROL-XL) 50 MG 24 hr tablet TAKE 1 TABLET EVERY DAY. TAKE WITH OR IMMEDIATELY FOLLOWING A MEAL 90 tablet 0   Multiple Vitamin (MULTIVITAMIN WITH MINERALS) TABS Take 1 tablet by mouth daily.     Omega-3 Fatty Acids (FISH OIL PO) Take by mouth.     pantoprazole (PROTONIX) 20 MG tablet TAKE 1 TABLET EVERY DAY 90 tablet 10   potassium chloride SA (KLOR-CON) 20 MEQ tablet Take 1 tablet (20 mEq total) by mouth daily. 90 tablet 3   rOPINIRole (REQUIP) 0.5 MG tablet Take 1 tablet (0.5 mg total) by mouth at bedtime. 30 tablet 3   rosuvastatin (CRESTOR) 20 MG tablet TAKE 1 TABLET EVERY DAY 90 tablet 1   SPIKEVAX syringe      TRUEplus Lancets 33G MISC TEST BLOOD SUGAR EVERY DAY 702 each 3   TRULICITY 1.5 OV/7.8HY SOPN INJECT 1.5 MG (0.5ML) UNDER THE SKIN ONCE A WEEK 4 mL 0   No current facility-administered medications on file prior to visit.   Allergies  Allergen Reactions   Jardiance [  Empagliflozin]     Severe nausea-    Social History   Socioeconomic History   Marital status: Married    Spouse name: Not on file   Number of children: Not on file   Years of education: Not on file   Highest education level: Not on file  Occupational History   Occupation: retired    Fish farm manager: RETIRED    Comment: Autom[employer]   Tobacco Use   Smoking status: Former    Years: 35.00    Types: Cigarettes    Quit date: 01/02/1988    Years since quitting: 34.1   Smokeless tobacco: Never  Vaping Use   Vaping Use: Never used  Substance and Sexual Activity   Alcohol use: Not Currently    Comment: rare beer, no history of ETOH abuse   Drug use: No   Sexual activity: Not on file  Other Topics Concern   Not on file  Social History Narrative   Not on file   Social Determinants of Health   Financial  Resource Strain: Not on file  Food Insecurity: Not on file  Transportation Needs: No Transportation Needs (09/17/2020)   PRAPARE - Hydrologist (Medical): No    Lack of Transportation (Non-Medical): No  Physical Activity: Not on file  Stress: Not on file  Social Connections: Not on file  Intimate Partner Violence: Not on file   Family History  Problem Relation Age of Onset   Diabetes Mother    Heart disease Mother        After age 54   Heart attack Mother    Hypertension Mother    Diabetes Father    Heart disease Father        After age 67   Hypertension Father    Heart attack Father    Brain cancer Sister    Cancer Sister        Brain   Diabetes Sister    Hypertension Sister    Cancer Brother        Chest Tumor   Diabetes Brother    Hypertension Brother    Deep vein thrombosis Sister    Diabetes Sister    Diabetes Brother        Bilateral leg   Colon cancer Neg Hx       Review of Systems  All other systems reviewed and are negative.      Objective:   Physical Exam Vitals reviewed.  Constitutional:      General: He is not in acute distress.    Appearance: He is well-developed. He is not diaphoretic.  HENT:     Head: Normocephalic and atraumatic.  Neck:     Thyroid: No thyromegaly.     Vascular: No JVD.     Trachea: No tracheal deviation.  Cardiovascular:     Rate and Rhythm: Normal rate and regular rhythm.     Heart sounds: Murmur heard.     No friction rub. No gallop.  Pulmonary:     Effort: Pulmonary effort is normal. No respiratory distress.     Breath sounds: Normal breath sounds. No stridor. No wheezing or rales.  Chest:     Chest wall: No tenderness.  Abdominal:     General: Bowel sounds are normal.     Palpations: Abdomen is soft.  Musculoskeletal:       Back:       Legs:  Neurological:     Mental Status: He is alert and  oriented to person, place, and time.     Cranial Nerves: No cranial nerve deficit.      Motor: No abnormal muscle tone.     Coordination: Coordination normal.     Deep Tendon Reflexes: Reflexes are normal and symmetric.  Psychiatric:        Behavior: Behavior normal.        Thought Content: Thought content normal.        Judgment: Judgment normal.           Assessment & Plan:  Right sided sciatica - Plan: DG Lumbar Spine Complete Patient appears to have right-sided sciatica likely due to a herniated disc.  Obtain an x-ray of the lumbar spine.  Meanwhile begin prednisone taper pack for pain relief and supplement with Norco 5/325 1 p.o. every 6 hours as needed pain

## 2022-02-08 NOTE — Telephone Encounter (Signed)
Requested Prescriptions  Pending Prescriptions Disp Refills   doxazosin (CARDURA) 8 MG tablet [Pharmacy Med Name: DOXAZOSIN MESYLATE 8 MG Tablet] 90 tablet 3    Sig: TAKE 1 TABLET EVERY DAY     Cardiovascular:  Alpha Blockers Failed - 02/08/2022  2:40 AM      Failed - Valid encounter within last 6 months    Recent Outpatient Visits           11 months ago Diabetes mellitus without complication ([place])   [place] Pickard, Cammie Mcgee, MD   1 year ago Irregular heart beat   [place] Pickard, Cammie Mcgee, MD   1 year ago Essential hypertension   [place], Cammie Mcgee, MD   2 years ago Iron deficiency anemia, unspecified iron deficiency anemia type   [place] Susy Frizzle, MD   2 years ago Shortness of breath   [place] Pickard, Cammie Mcgee, MD              Passed - Last BP in normal range    BP Readings from Last 1 Encounters:  02/08/22 132/68          rosuvastatin (CRESTOR) 20 MG tablet [Pharmacy Med Name: ROSUVASTATIN CALCIUM 20 MG Tablet] 90 tablet 3    Sig: TAKE 1 TABLET EVERY DAY     Cardiovascular:  Antilipid - Statins 2 Failed - 02/08/2022  2:40 AM      Failed - Cr in normal range and within 360 days    Creat  Date Value Ref Range Status  12/08/2021 1.52 (H) 0.70 - 1.22 mg/dL Final   Creatinine, Urine  Date Value Ref Range Status  05/14/2020 116 20 - 320 mg/dL Final         Failed - Lipid Panel in normal range within the last 12 months    Cholesterol  Date Value Ref Range Status  12/08/2021 114 <200 mg/dL Final   LDL Cholesterol (Calc)  Date Value Ref Range Status  12/08/2021 54 mg/dL (calc) Final    Comment:    Reference range: <100 . Desirable range <100 mg/dL for primary prevention;   <70 mg/dL for patients with CHD or diabetic patients  with > or = 2 CHD risk factors. Marland Kitchen LDL-C is now calculated using the Martin-Hopkins  calculation, which is a  validated novel method providing  better accuracy than the Friedewald equation in the  estimation of LDL-C.  Cresenciano Genre et al. Annamaria Helling. 6294;765(46): 2061-2068  (http://education.[facility].com/faq/FAQ164)    HDL  Date Value Ref Range Status  12/08/2021 35 (L) > OR = 40 mg/dL Final   Triglycerides  Date Value Ref Range Status  12/08/2021 171 (H) <150 mg/dL Final         Passed - Patient is not pregnant      Passed - Valid encounter within last 12 months    Recent Outpatient Visits           11 months ago Diabetes mellitus without complication ([place])   [place] Pickard, Cammie Mcgee, MD   1 year ago Irregular heart beat   [place] Pickard, Cammie Mcgee, MD   1 year ago Essential hypertension   [place], Warren T, MD   2 years ago Iron deficiency anemia, unspecified iron deficiency anemia type   [facility] Susy Frizzle, MD   2 years ago Shortness of breath  [facility] Pickard, Cammie Mcgee, MD

## 2022-03-05 ENCOUNTER — Other Ambulatory Visit: Payer: Self-pay | Admitting: Family Medicine

## 2022-04-13 ENCOUNTER — Encounter: Payer: Self-pay | Admitting: Vascular Surgery

## 2022-04-13 ENCOUNTER — Ambulatory Visit: Payer: Medicare HMO | Admitting: Vascular Surgery

## 2022-04-13 ENCOUNTER — Ambulatory Visit (HOSPITAL_COMMUNITY)
Admission: RE | Admit: 2022-04-13 | Discharge: 2022-04-13 | Disposition: A | Discharge status: Home / Self Care | Payer: Medicare HMO | Source: Ambulatory Visit | Attending: Vascular Surgery | Admitting: Vascular Surgery

## 2022-04-13 VITALS — BP 158/70 | HR 63 | Temp 97.9°F | Resp 20 | Ht 70.0 in | Wt 242.0 lb

## 2022-04-13 DIAGNOSIS — I739 Peripheral vascular disease, unspecified: Secondary | ICD-10-CM

## 2022-04-13 DIAGNOSIS — I714 Abdominal aortic aneurysm, without rupture, unspecified: Secondary | ICD-10-CM | POA: Diagnosis not present

## 2022-04-13 DIAGNOSIS — I7143 Infrarenal abdominal aortic aneurysm, without rupture: Secondary | ICD-10-CM

## 2022-04-13 NOTE — Progress Notes (Signed)
REASON FOR VISIT:   Follow-up of abdominal aortic aneurysm  MEDICAL ISSUES:   ABDOMINAL AORTIC ANEURYSM: His abdominal aortic aneurysm is now 5.5 cm in maximum diameter.  This has increased from 5.2 cm in May.  The risk of rupture for an aneurysm in size is approximately 7 %/year.  I have recommended that we get a CT angiogram to determine if he is a candidate for endovascular repair.  If he is a candidate, given his age and comorbidities I would favor endovascular repair if at all possible.  If he would require open repair, given his age, obesity, and comorbidities this would be associated with significantly increased risk.  In addition he has some mild chronic kidney disease.  If he is unable to get a CT angio because of his kidney function (I do not have any recent labs), that he would need renal evaluation to maximize his kidney function before trying to get a CT scan.  After his CT scan is complete I will see him back to discuss these results.  Fortunately he is not a smoker.  HPI:   Juan [name] is a pleasant 82 y.o. male who I most recently did a virtual visit with on 01/27/2021.  We have been following abdominal aortic aneurysm.  Duplex in February 2022 showed that his maximum diameter of his aneurysm was 5 cm.  He came in for an ultrasound to our office but the aorta could not be adequately visualized.  He had a CT scan done in [place] on 01/11/2021.  Of note he previously had a right carotid bruit and carotid duplex scan did not show any significant carotid disease bilaterally.  He comes in for routine follow-up visit.  Today, the patient has had chronic low back pain.  This is not new.  He has had no new onset abdominal pain.  He quit smoking in 1989.  He does have a history of coronary artery disease and has had previous PTCA.  He also has obstructive sleep apnea.  In addition he has type 2 diabetes, hypertension, and hypercholesterolemia.  He is on aspirin and is on a  statin.  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    50-69% (12/2015)   Neuromuscular disorder ([place])    L2-3,L5-S1 bulging disc /nerve impingement   PAD (peripheral artery disease) ([place])     Family History  Problem Relation Age of Onset   Diabetes Mother    Heart disease Mother        After age 57   Heart attack Mother    Hypertension Mother    Diabetes Father    Heart disease Father        After age 49   Hypertension Father    Heart attack Father    Brain cancer Sister    Cancer Sister        Brain   Diabetes Sister    Hypertension Sister    Cancer Brother        Chest Tumor   Diabetes Brother    Hypertension Brother  Deep vein thrombosis Sister    Diabetes Sister    Diabetes Brother        Bilateral leg   Colon cancer Neg Hx     SOCIAL HISTORY: Social History   Tobacco Use   Smoking status: Former    Years: 35.00    Types: Cigarettes    Quit date: 01/02/1988    Years since quitting: 34.3   Smokeless tobacco: Never  Substance Use Topics   Alcohol use: Not Currently    Comment: rare beer, no history of ETOH abuse    Allergies  Allergen Reactions   Jardiance [Empagliflozin]     Severe nausea-     Current Outpatient Medications  Medication Sig Dispense Refill   allopurinol (ZYLOPRIM) 300 MG tablet TAKE 1 TABLET EVERY DAY 90 tablet 10   aspirin EC 81 MG tablet Take 81 mg by mouth daily.     Blood Glucose Monitoring Suppl (TRUE METRIX METER) w/Device KIT Use as Directed 1 kit 1   diltiazem (CARDIZEM CD) 240 MG 24 hr capsule TAKE 1 CAPSULE EVERY DAY 90 capsule 0   doxazosin (CARDURA) 8 MG  tablet TAKE 1 TABLET EVERY DAY 90 tablet 3   Dulaglutide (TRULICITY) 1.5 GG/2.6RS SOPN INJECT 1.5 MG (0.5ML) UNDER THE SKIN ONCE A WEEK 4 mL 3   glipiZIDE (GLUCOTROL) 5 MG tablet TAKE 1 TABLET TWICE DAILY BEFORE MEALS 180 tablet 0   glucose blood (TRUE METRIX BLOOD GLUCOSE TEST) test strip 1 each by Other route daily. DX: E11.9 100 strip 5   Lancet Devices (PRODIGY LANCING DEVICE) MISC Use bid DX e11.9 1 each 2   lisinopril (ZESTRIL) 40 MG tablet TAKE 1 TABLET EVERY DAY 90 tablet 10   metoprolol succinate (TOPROL-XL) 50 MG 24 hr tablet TAKE 1 TABLET EVERY DAY. TAKE WITH OR IMMEDIATELY FOLLOWING A MEAL 90 tablet 0   Multiple Vitamin (MULTIVITAMIN WITH MINERALS) TABS Take 1 tablet by mouth daily.     Omega-3 Fatty Acids (FISH OIL PO) Take by mouth.     pantoprazole (PROTONIX) 20 MG tablet TAKE 1 TABLET EVERY DAY 90 tablet 10   potassium chloride SA (KLOR-CON) 20 MEQ tablet Take 1 tablet (20 mEq total) by mouth daily. 90 tablet 3   rOPINIRole (REQUIP) 0.5 MG tablet Take 1 tablet (0.5 mg total) by mouth at bedtime. 30 tablet 3   rosuvastatin (CRESTOR) 20 MG tablet TAKE 1 TABLET EVERY DAY 90 tablet 3   SPIKEVAX syringe      TRUEplus Lancets 33G MISC TEST BLOOD SUGAR EVERY DAY 100 each 3   No current facility-administered medications for this visit.    REVIEW OF SYSTEMS:  '[X]'$  denotes positive finding, '[ ]'$  denotes negative finding Cardiac  Comments:  Chest pain or chest pressure:    Shortness of breath upon exertion: x   Short of breath when lying flat:    Irregular heart rhythm:        Vascular    Pain in calf, thigh, or hip brought on by ambulation:    Pain in feet at night that wakes you up from your sleep:     Blood clot in your veins:    Leg swelling:         Pulmonary    Oxygen at home:    Productive cough:     Wheezing:         Neurologic    Sudden weakness in arms or legs:     Sudden numbness in arms or legs:  Sudden onset of difficulty speaking or slurred speech:     Temporary loss of vision in one eye:     Problems with dizziness:         Gastrointestinal    Blood in stool:     Vomited blood:         Genitourinary    Burning when urinating:     Blood in urine:        Psychiatric    Major depression:         Hematologic    Bleeding problems:    Problems with blood clotting too easily:        Skin    Rashes or ulcers:        Constitutional    Fever or chills:     PHYSICAL EXAM:   Vitals:   04/13/22 0905  BP: (!) 158/70  Pulse: 63  Resp: 20  Temp: 97.9 F (36.6 C)  SpO2: 98%  Weight: 242 lb (109.8 kg)  Height: '5\' 10"'$  (1.778 m)   Body mass index is 34.72 kg/m.  GENERAL: The patient is a well-nourished male, in no acute distress. The vital signs are documented above. CARDIAC: There is a regular rate and rhythm.  VASCULAR: I do not detect carotid bruits. On the right side he has a palpable femoral, popliteal, dorsalis pedis, and posterior tibial pulse. On the left side he has a palpable femoral pulse and popliteal pulse.  He has a biphasic anterior tibial and posterior tibial signal with the Doppler.  He has a monophasic dorsalis pedis signal. PULMONARY: There is good air exchange bilaterally without wheezing or rales. ABDOMEN: Soft and non-tender with normal pitched bowel sounds.  MUSCULOSKELETAL: There are no major deformities or cyanosis. NEUROLOGIC: No focal weakness or paresthesias are detected. SKIN: There are no ulcers or rashes noted. PSYCHIATRIC: The patient has a normal affect.  DATA:    DUPLEX ABDOMINAL AORTA: I have independently interpreted his duplex of the abdominal aorta.  The maximum diameter of his infrarenal aorta is 5.5 cm.  This is increased from 5.2 cm on 07/28/2021.  The right common iliac artery measures 2.0 cm in maximum diameter.  The left common iliac artery could not be visualized.  There was some overlying bowel gas.  CT ANGIO ABDOMEN PELVIS: I did review the CT angio of the abdomen pelvis that was  done in November 2022.  At that time he appeared to be potentially a candidate for endovascular aneurysm repair.  LABS: His most recent creatinine was on 12/08/2021.  It was 1.52.  His GFR on 05/14/2020 was 41.    Deitra Mayo [facility] [phone]

## 2022-04-20 ENCOUNTER — Other Ambulatory Visit: Payer: Self-pay

## 2022-04-20 DIAGNOSIS — I7143 Infrarenal abdominal aortic aneurysm, without rupture: Secondary | ICD-10-CM

## 2022-04-20 NOTE — Addendum Note (Signed)
Addended by: Kaleen Mask on: 04/20/2022 03:59 PM   Modules accepted: Orders

## 2022-05-04 ENCOUNTER — Ambulatory Visit (HOSPITAL_COMMUNITY)
Admission: RE | Admit: 2022-05-04 | Discharge: 2022-05-04 | Disposition: A | Discharge status: Home / Self Care | Payer: Medicare HMO | Source: Ambulatory Visit | Attending: Vascular Surgery | Admitting: Vascular Surgery

## 2022-05-04 ENCOUNTER — Other Ambulatory Visit: Payer: Self-pay | Admitting: Vascular Surgery

## 2022-05-04 DIAGNOSIS — E119 Type 2 diabetes mellitus without complications: Secondary | ICD-10-CM | POA: Insufficient documentation

## 2022-05-04 DIAGNOSIS — N281 Cyst of kidney, acquired: Secondary | ICD-10-CM | POA: Diagnosis not present

## 2022-05-04 DIAGNOSIS — I2510 Atherosclerotic heart disease of native coronary artery without angina pectoris: Secondary | ICD-10-CM | POA: Diagnosis not present

## 2022-05-04 DIAGNOSIS — I714 Abdominal aortic aneurysm, without rupture, unspecified: Secondary | ICD-10-CM | POA: Diagnosis not present

## 2022-05-04 DIAGNOSIS — J439 Emphysema, unspecified: Secondary | ICD-10-CM | POA: Diagnosis not present

## 2022-05-04 DIAGNOSIS — I7143 Infrarenal abdominal aortic aneurysm, without rupture: Secondary | ICD-10-CM | POA: Diagnosis not present

## 2022-05-04 DIAGNOSIS — K5730 Diverticulosis of large intestine without perforation or abscess without bleeding: Secondary | ICD-10-CM | POA: Diagnosis not present

## 2022-05-04 DIAGNOSIS — I513 Intracardiac thrombosis, not elsewhere classified: Secondary | ICD-10-CM | POA: Diagnosis not present

## 2022-05-04 LAB — POCT I-STAT CREATININE: Creatinine, Ser: 1.6 mg/dL — ABNORMAL HIGH (ref 0.61–1.24)

## 2022-05-04 MED ORDER — IOHEXOL 350 MG/ML SOLN
75.0000 mL | Freq: Once | INTRAVENOUS | Status: AC | PRN
  Administered 2022-05-04: 75 mL via INTRAVENOUS

## 2022-05-11 ENCOUNTER — Encounter: Payer: Self-pay | Admitting: Vascular Surgery

## 2022-05-11 ENCOUNTER — Ambulatory Visit: Payer: Medicare HMO | Admitting: Vascular Surgery

## 2022-05-11 ENCOUNTER — Telehealth: Payer: Self-pay | Admitting: Student

## 2022-05-11 VITALS — BP 163/76 | HR 92 | Temp 98.1°F | Resp 20 | Ht 70.0 in | Wt 247.0 lb

## 2022-05-11 DIAGNOSIS — I7143 Infrarenal abdominal aortic aneurysm, without rupture: Secondary | ICD-10-CM | POA: Diagnosis not present

## 2022-05-11 NOTE — Telephone Encounter (Signed)
   Pre-operative Risk Assessment    Patient Name: Juan [name]  DOB: 1940-04-21 MRN: [id number]      Request for Surgical Clearance    Procedure:   EVAR  Date of Surgery:  Clearance TBD                                 Surgeon:  Dr. Deitra Mayo Surgeon's Group or Practice Name:  [facility] Phone number:  [phone]  Fax number:  [phone], ATTN:  Herma Ard   Type of Clearance Requested:   - Medical  - Pharmacy:  Hold        Type of Anesthesia:   Unknown   Additional requests/questions:   Caller stated they will need clearance as soon as possible.  Signed, Heloise Beecham   05/11/2022, 2:33 PM

## 2022-05-11 NOTE — Telephone Encounter (Signed)
   Name: Juan [name]  DOB: 02-10-1941  MRN: [id number]  Primary Cardiologist: Kate Sable, MD (Inactive)  Chart reviewed as part of pre-operative protocol coverage. Because of Juan [name] past medical history and time since last visit, he will require a follow-up in-office visit in order to better assess preoperative cardiovascular risk.  Pre-op covering staff: - Please schedule appointment and call patient to inform them. If patient already had an upcoming appointment within acceptable timeframe, please add "pre-op clearance" to the appointment notes so provider is aware. - Please contact requesting surgeon's office via preferred method (i.e, phone, fax) to inform them of need for appointment prior to surgery.  Patient has not been seen in three years. Any med hold recommendations can be made at the time of the in-person appointment.  Juan Collard, PA-C  05/11/2022, 6:07 PM

## 2022-05-11 NOTE — Progress Notes (Signed)
REASON FOR VISIT:   To discuss endovascular aneurysm repair.  MEDICAL ISSUES:   5.7 CM INFRARENAL ABDOMINAL AORTIC ANEURYSM: This patient has a 5.7 centimeter infrarenal abdominal aortic aneurysm.  His risk of rupture is about 7 to 10 %/year.  I have recommended elective repair.  He does have some reverse taper on the neck but I think he would be a candidate for endovascular repair.  He is undergone previous PTCA in the past and does admit to some dyspnea on exertion.  We will obtain preoperative cardiac clearance and pending these results schedule him for endovascular aneurysm repair.  I have discussed the indications for aneurysm repair. We have discussed the advantages and disadvantages of open versus endovascular repair. The patient wishes to proceed with endovascular aneurysm repair (EVAR). I have discussed the potential complications of EVAR, including, but not limited to: bleeding, infection, arterial injury, graft migration, endoleak, renal failure, MI or other unpredictable medical problems. We have discussed the possibility of having to convert to open repair. We also discussed the need for continued lifelong follow-up after EVAR. All of the patients questions were answered and they are agreeable to proceed with surgery.   He does have some mild renal insufficiency.  His last creatinine in February was 1.6.  Will try to limit contrast as best possible.   HPI:   Juan [name] is a pleasant 82 y.o. male who I saw on 04/13/2022 for follow-up of his infrarenal abdominal aortic aneurysm.  This enlarged to 5.5 cm.  Given the enlargement I felt that his risk of rupture was approximately 7 %/year.  He had a CT angiogram which shows that he is a candidate for an endovascular repair.  Since I saw him last, he denies any abdominal pain or back pain.  He has undergone previous PTCA in the remote past.  He has not seen cardiology recently.  He does admit to some dyspnea on exertion.  He denies  any chest pain.  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    50-69% (12/2015)   Neuromuscular disorder ([place])    L2-3,L5-S1 bulging disc /nerve impingement   PAD (peripheral artery disease) ([place])     Family History  Problem Relation Age of Onset   Diabetes Mother    Heart disease Mother        After age 58   Heart attack Mother    Hypertension Mother    Diabetes Father    Heart disease Father        After age 56   Hypertension Father    Heart attack Father    Brain cancer Sister    Cancer Sister        Brain   Diabetes Sister    Hypertension Sister    Cancer Brother        Chest Tumor   Diabetes Brother    Hypertension Brother    Deep vein thrombosis Sister    Diabetes Sister    Diabetes Brother        Bilateral leg   Colon cancer Neg Hx  SOCIAL HISTORY: Social History   Tobacco Use   Smoking status: Former    Years: 35.00    Types: Cigarettes    Quit date: 01/02/1988    Years since quitting: 34.3   Smokeless tobacco: Never  Substance Use Topics   Alcohol use: Not Currently    Comment: rare beer, no history of ETOH abuse    Allergies  Allergen Reactions   Jardiance [Empagliflozin]     Severe nausea-     Current Outpatient Medications  Medication Sig Dispense Refill   allopurinol (ZYLOPRIM) 300 MG tablet TAKE 1 TABLET EVERY DAY 90 tablet 10   aspirin EC 81 MG tablet Take 81 mg by mouth daily.     Blood Glucose Monitoring Suppl (TRUE METRIX METER) w/Device KIT Use as Directed 1 kit 1   diltiazem (CARDIZEM CD) 240 MG 24 hr capsule TAKE 1 CAPSULE EVERY DAY 90 capsule 0   doxazosin (CARDURA)  8 MG tablet TAKE 1 TABLET EVERY DAY 90 tablet 3   Dulaglutide (TRULICITY) 1.5 0000000 SOPN INJECT 1.5 MG (0.5ML) UNDER THE SKIN ONCE A WEEK 4 mL 3   glipiZIDE (GLUCOTROL) 5 MG tablet TAKE 1 TABLET TWICE DAILY BEFORE MEALS 180 tablet 0   glucose blood (TRUE METRIX BLOOD GLUCOSE TEST) test strip 1 each by Other route daily. DX: E11.9 100 strip 5   Lancet Devices (PRODIGY LANCING DEVICE) MISC Use bid DX e11.9 1 each 2   lisinopril (ZESTRIL) 40 MG tablet TAKE 1 TABLET EVERY DAY 90 tablet 10   metoprolol succinate (TOPROL-XL) 50 MG 24 hr tablet TAKE 1 TABLET EVERY DAY. TAKE WITH OR IMMEDIATELY FOLLOWING A MEAL 90 tablet 0   Multiple Vitamin (MULTIVITAMIN WITH MINERALS) TABS Take 1 tablet by mouth daily.     Omega-3 Fatty Acids (FISH OIL PO) Take by mouth.     pantoprazole (PROTONIX) 20 MG tablet TAKE 1 TABLET EVERY DAY 90 tablet 10   potassium chloride SA (KLOR-CON) 20 MEQ tablet Take 1 tablet (20 mEq total) by mouth daily. 90 tablet 3   rOPINIRole (REQUIP) 0.5 MG tablet Take 1 tablet (0.5 mg total) by mouth at bedtime. 30 tablet 3   rosuvastatin (CRESTOR) 20 MG tablet TAKE 1 TABLET EVERY DAY 90 tablet 3   SPIKEVAX syringe      TRUEplus Lancets 33G MISC TEST BLOOD SUGAR EVERY DAY 100 each 3   No current facility-administered medications for this visit.    REVIEW OF SYSTEMS:  '[X]'$  denotes positive finding, '[ ]'$  denotes negative finding Cardiac  Comments:  Chest pain or chest pressure:    Shortness of breath upon exertion: x   Short of breath when lying flat:    Irregular heart rhythm:        Vascular    Pain in calf, thigh, or hip brought on by ambulation:    Pain in feet at night that wakes you up from your sleep:     Blood clot in your veins:    Leg swelling:         Pulmonary    Oxygen at home:    Productive cough:     Wheezing:         Neurologic    Sudden weakness in arms or legs:     Sudden numbness in arms or legs:     Sudden onset of difficulty speaking or slurred speech:     Temporary loss of vision in one eye:     Problems with dizziness:  Gastrointestinal    Blood in stool:     Vomited blood:         Genitourinary    Burning when urinating:     Blood in urine:        Psychiatric    Major depression:         Hematologic    Bleeding problems:    Problems with blood clotting too easily:        Skin    Rashes or ulcers:        Constitutional    Fever or chills:     PHYSICAL EXAM:   Vitals:   05/11/22 1349  BP: (!) 163/76  Pulse: 92  Resp: 20  Temp: 98.1 F (36.7 C)  SpO2: 95%  Weight: 247 lb (112 kg)  Height: '5\' 10"'$  (1.778 m)   Body mass index is 35.44 kg/m.  GENERAL: The patient is a well-nourished male, in no acute distress. The vital signs are documented above. CARDIAC: There is a regular rate and rhythm.  VASCULAR: I do not detect carotid bruits. On the right side he has a palpable femoral pulse and palpable pedal pulses. On the left side he has a palpable femoral pulse and popliteal pulse.  He has a biphasic anterior tibial and posterior tibial signal with the Doppler.  He has a monophasic dorsalis pedis signal on the left. PULMONARY: There is good air exchange bilaterally without wheezing or rales. ABDOMEN: Soft and non-tender with normal pitched bowel sounds.  MUSCULOSKELETAL: There are no major deformities or cyanosis. NEUROLOGIC: No focal weakness or paresthesias are detected. SKIN: There are no ulcers or rashes noted. PSYCHIATRIC: The patient has a normal affect.  DATA:    CT ANGIO ABDOMEN PELVIS: I reviewed the images of his CT angio of the abdomen pelvis.  The maximum diameter of the aneurysm is 5.7 cm.  Based on the study he does appear to be a candidate for endovascular aneurysm repair.  He does have some reversed taper of the neck but I have reviewed the films with [place] and it looks like we can address this with the conformable device.   Based on his CT scan we would likely do an ipsilateral right with a 28 X  14 X 12 cm  (14 X 10 cm) 18 Fr. On the left, 12 X 12 cm 12 Fr.   Juan [name] [facility] [phone]

## 2022-05-12 NOTE — Telephone Encounter (Signed)
Pt has been scheduled to see Melinda Crutch, NP, Monday, 05/16/22, clearance will be addressed at that time.  Will route to requesting surgeon's office to make them aware.

## 2022-05-15 ENCOUNTER — Encounter: Payer: Self-pay | Admitting: Cardiology

## 2022-05-15 ENCOUNTER — Ambulatory Visit: Payer: Medicare HMO | Attending: Cardiology | Admitting: Cardiology

## 2022-05-15 VITALS — BP 132/60 | HR 77 | Ht 71.0 in | Wt 248.0 lb

## 2022-05-15 DIAGNOSIS — I10 Essential (primary) hypertension: Secondary | ICD-10-CM

## 2022-05-15 DIAGNOSIS — I714 Abdominal aortic aneurysm, without rupture, unspecified: Secondary | ICD-10-CM | POA: Diagnosis not present

## 2022-05-15 DIAGNOSIS — I6523 Occlusion and stenosis of bilateral carotid arteries: Secondary | ICD-10-CM

## 2022-05-15 DIAGNOSIS — R0609 Other forms of dyspnea: Secondary | ICD-10-CM | POA: Diagnosis not present

## 2022-05-15 DIAGNOSIS — Z01818 Encounter for other preprocedural examination: Secondary | ICD-10-CM | POA: Diagnosis not present

## 2022-05-15 DIAGNOSIS — I2510 Atherosclerotic heart disease of native coronary artery without angina pectoris: Secondary | ICD-10-CM | POA: Diagnosis not present

## 2022-05-15 DIAGNOSIS — E785 Hyperlipidemia, unspecified: Secondary | ICD-10-CM

## 2022-05-15 NOTE — Patient Instructions (Signed)
Medication Instructions:  Your physician recommends that you continue on your current medications as directed. Please refer to the Current Medication list given to you today.  *If you need a refill on your cardiac medications before your next appointment, please call your pharmacy*   Lab Work: NONE   If you have labs (blood work) drawn today and your tests are completely normal, you will receive your results only by: [place] (if you have MyChart) OR A paper copy in the mail If you have any lab test that is abnormal or we need to change your treatment, we will call you to review the results.   Testing/Procedures: Your physician has requested that you have an echocardiogram. Echocardiography is a painless test that uses sound waves to create images of your heart. It provides your doctor with information about the size and shape of your heart and how well your heart's chambers and valves are working. This procedure takes approximately one hour. There are no restrictions for this procedure. Please do NOT wear cologne, perfume, aftershave, or lotions (deodorant is allowed). Please arrive 15 minutes prior to your appointment time.    Follow-Up: At [facility], you and your health needs are our priority.  As part of our continuing mission to provide you with exceptional heart care, we have created designated Provider Care Teams.  These Care Teams include your primary Cardiologist (physician) and Advanced Practice Providers (APPs -  Physician Assistants and Nurse Practitioners) who all work together to provide you with the care you need, when you need it.  We recommend signing up for the patient portal called "MyChart".  Sign up information is provided on this After Visit Summary.  MyChart is used to connect with patients for Virtual Visits (Telemedicine).  Patients are able to view lab/test results, encounter notes, upcoming appointments, etc.  Non-urgent messages can be sent to  your provider as well.   To learn more about what you can do with MyChart, go to [email or web address].    Your next appointment:   6 month(s)  Provider:   Claudina Lick, MD    Other Instructions Thank you for choosing [facility]!

## 2022-05-15 NOTE — Progress Notes (Signed)
Cardiology Office Note:   Date:  05/15/2022  ID:  Juan [name], DOB 11/06/1940, MRN [id number]  History of Present Illness:   Juan [name] is a 82 y.o. male with a history of CAD s/p remote angioplasty in 1989, HTN, HLD, type II DM, PAD, AAA, and CKD stage III, who is here today for follow as well as needing cardiac clearance for EVAR.  He was last seen in clinic 06/11/2019 by Juan Leriche, PA, at that time he reported overall doing well from a cardiac perspective since his last visit.  He denied any chest pain or dyspnea on exertion he had described an occasional burning sensation along the sternal region that sounded more like GERD.  There were no medication changes that were made or further testing that had to be ordered.  He returns to clinic today accompanied by his wife.  Unfortunately he has been evaluated for EVAR as his aneurysm has increased in size from 5.2-5.7 on CT scan.  So he returns today after being lost to follow-up for approximately 3 years needing preoperative clearance to have been his endovascular repair of his aneurysm.  He denies any chest pain or chest tightness but endorses dyspnea on exertion.  He states it has not worsened over about the approximately the last 3 months but he notices a different over the last year.  He states that his primary care provider has been following his EKGs in the office and has not noted any changes at that time.  Denies any recent hospitalizations or visits to the emergency department.  ROS: Endorses dyspnea on exertion  Studies Reviewed:    EKG: Sinus rhythm with a first-degree AV block with a rate of 77 and LVH  AAA Duplex Limited 04/13/22 Summary:  Abdominal Aorta: There is evidence of abnormal dilatation of the mid  Abdominal aorta. The largest aortic measurement is 5.5 cm. The largest  aortic diameter has increased compared to prior exam. Previous diameter  measurement was 5.2 cm obtained on  07/28/2021.   TTE 10/30/19 1. Left  ventricular ejection fraction, by estimation, is 65 to 70%. The  left ventricle has normal function. The left ventricle has no regional  wall motion abnormalities. There is mild left ventricular hypertrophy.  Left ventricular diastolic parameters  are indeterminate.   2. Right ventricular systolic function is normal. The right ventricular  size is normal. Tricuspid regurgitation signal is inadequate for assessing  PA pressure.   3. Left atrial size was mild to moderately dilated.   4. The mitral valve is grossly normal. Trivial mitral valve  regurgitation.   5. The aortic valve is tricuspid. Aortic valve regurgitation is not  visualized. Mild aortic valve stenosis. Aortic valve mean gradient  measures 13.0 mmHg.   6. The inferior vena cava is normal in size with greater than 50%  respiratory variability, suggesting right atrial pressure of 3 mmHg.   Risk Assessment/Calculations:             {Click Here to CalculatHis functional capacity is fair at 5.19 METs according to the [facility] Activity Status Index (DASI).e RCRI      :[id number]   Mr. Juan [name] perioperative risk of a major cardiac event is 11% according to the Revised Cardiac Risk Index (RCRI).  Therefore, he is at high risk for perioperative complications.   His functional capacity is fair at 5.19 METs according to the [facility] Activity Status Index (DASI). Recommendations: The patient requires an echocardiogram before a disposition can be made regarding surgical risk.  Physical Exam:   VS:  BP 132/60 (BP Location: Left Arm, Patient Position: Sitting, Cuff Size: Normal)   Pulse 77   Ht '5\' 11"'$  (1.803 m)   Wt 248 lb (112.5 kg)   SpO2 98%   BMI 34.59 kg/m    Wt Readings from Last 3 Encounters:  05/15/22 248 lb (112.5 kg)  05/11/22 247 lb (112 kg)  04/13/22 242 lb (109.8 kg)     GEN: Well nourished, well developed in no acute distress NECK: No JVD; No carotid bruits CARDIAC: RRR, no murmurs, rubs,  gallops RESPIRATORY:  Clear to auscultation without rales, wheezing or rhonchi  ABDOMEN: Soft, non-tender, non-distended EXTREMITIES:  No edema; No deformity   ASSESSMENT AND PLAN:   Preoperative clearance for EVAR with RCRI and DASI completed during this visit today his perioperative risk of major cardiac event is 11% according to the revised cardiac risk index therefore he is at high risk for perioperative complications.  His functional capacity is fair at 5.9 METS according to [facility] activity status index but with his worsening dyspnea he has been scheduled for an echocardiogram to be completed before his disposition can be completed regarding surgical risk.  Coronary artery disease involving the native coronary artery without angina as he is status post remote angioplasty in 1989 with a low risk NST in 01/2018.  He denies any recent exertional chest pain but does have exertional dyspnea.  With that he has been scheduled for an echocardiogram.  He is continued on aspirin 81 mg daily, rosuvastatin 20 mg daily, and Toprol-XL 50 mg daily.  Hypertension with a blood pressure today of 132/60.  Blood pressure remained stable.  He has been continued on Cardizem 240 mg daily, Cardura 8 mg daily, lisinopril 40 mg daily and Toprol-XL 50 mg daily.  He requires no refills to his medications today. Hyperlipidemia with a last LDL 54 12/08/2021 which remains at goal.  He is continued on rosuvastatin 20 mg daily.  This continues to be monitored by his PCP.  AAA with last measurement 5.7 cm most recent CT scan.  Patient is being scheduled for endovascular repair as soon as cardiac clearance is granted.  This is followed by vascular.  Carotid artery stenosis with his last carotid duplex and being completed in 11/2019 which showed less than 50% stenosis bilaterally.  He has been continued on aspirin and statin therapy.  Disposition patient return to clinic to see MD/APP in 6 months or sooner if needed.         Signed, Tabbetha Kutscher, NP

## 2022-05-16 NOTE — Addendum Note (Signed)
Addended by: Berlinda Last on: 05/16/2022 07:21 AM   Modules accepted: Orders

## 2022-05-17 ENCOUNTER — Ambulatory Visit: Payer: Medicare HMO | Attending: Cardiology

## 2022-05-17 ENCOUNTER — Other Ambulatory Visit (HOSPITAL_BASED_OUTPATIENT_CLINIC_OR_DEPARTMENT_OTHER): Payer: Medicare HMO

## 2022-05-17 DIAGNOSIS — R0609 Other forms of dyspnea: Secondary | ICD-10-CM

## 2022-05-18 LAB — ECHOCARDIOGRAM COMPLETE
AR max vel: 1.32 cm2
AV Area VTI: 1.66 cm2
AV Area mean vel: 1.63 cm2
AV Mean grad: 13 mmHg
AV Peak grad: 27.6 mmHg
Ao pk vel: 2.63 m/s
Area-P 1/2: 3.16 cm2
Calc EF: 66.9 %
MV M vel: 2.26 m/s
MV Peak grad: 20.4 mmHg
S' Lateral: 1.7 cm
Single Plane A2C EF: 62.3 %
Single Plane A4C EF: 71.8 %

## 2022-05-19 ENCOUNTER — Telehealth: Payer: Self-pay | Admitting: Cardiology

## 2022-05-19 NOTE — Telephone Encounter (Signed)
Patient notified and verbalized understanding. Patient had no questions or concerns at this time.  

## 2022-05-19 NOTE — Telephone Encounter (Signed)
-----   Message from Gerrie Nordmann, NP sent at 05/18/2022  4:13 PM EST ----- Heart squeeze remains strong at 65-70%, some muscle stiffness noted related to hypertension, mild aortic stenosis, no significant change noted from prior study. Patient is OK to proceed with surgery at moderate risk. Please send clearance to Vascular and notify patient of stable findings.

## 2022-05-19 NOTE — Telephone Encounter (Signed)
Patient is returning call. Transferred to Ashley, CMA.  

## 2022-05-23 ENCOUNTER — Other Ambulatory Visit: Payer: Self-pay

## 2022-05-23 DIAGNOSIS — I7143 Infrarenal abdominal aortic aneurysm, without rupture: Secondary | ICD-10-CM

## 2022-05-31 ENCOUNTER — Other Ambulatory Visit: Payer: Medicare HMO

## 2022-06-01 ENCOUNTER — Other Ambulatory Visit: Payer: Self-pay | Admitting: Family Medicine

## 2022-06-01 NOTE — Telephone Encounter (Signed)
Requested Prescriptions  Pending Prescriptions Disp Refills   diltiazem (CARDIZEM CD) 240 MG 24 hr capsule [Pharmacy Med Name: DILTIAZEM HYDROCHLORIDE ER 240 MG Capsule Extended Release 24 Hour] 90 capsule 3    Sig: TAKE 1 CAPSULE EVERY DAY     Cardiovascular: Calcium Channel Blockers 3 Failed - 06/01/2022  3:18 AM      Failed - Cr in normal range and within 360 days    Creat  Date Value Ref Range Status  12/08/2021 1.52 (H) 0.70 - 1.22 mg/dL Final   Creatinine, Ser  Date Value Ref Range Status  05/04/2022 1.60 (H) 0.61 - 1.24 mg/dL Final   Creatinine, Urine  Date Value Ref Range Status  05/14/2020 116 20 - 320 mg/dL Final         Failed - Valid encounter within last 6 months    Recent Outpatient Visits           1 year ago Diabetes mellitus without complication ([place])   [place] Pickard, Cammie Mcgee, MD   1 year ago Irregular heart beat   [place] Pickard, Cammie Mcgee, MD   2 years ago Essential hypertension   [place], Warren T, MD   2 years ago Iron deficiency anemia, unspecified iron deficiency anemia type   [place] Susy Frizzle, MD   2 years ago Shortness of breath   [place] Pickard, Cammie Mcgee, MD       Future Appointments             In 5 months Mallipeddi, Quenten Raven, MD [place] at [place] - ALT in normal range and within 360 days    ALT  Date Value Ref Range Status  12/08/2021 19 9 - 46 U/L Final         Passed - AST in normal range and within 360 days    AST  Date Value Ref Range Status  12/08/2021 16 10 - 35 U/L Final         Passed - Last BP in normal range    BP Readings from Last 1 Encounters:  05/15/22 132/60         Passed - Last Heart Rate in normal range    Pulse Readings from Last 1 Encounters:  05/15/22 77          metoprolol succinate (TOPROL-XL) 50 MG 24 hr tablet [Pharmacy  Med Name: METOPROLOL SUCCINATE ER 50 MG Tablet Extended Release 24 Hour] 90 tablet 3    Sig: TAKE 1 TABLET EVERY DAY. TAKE WITH OR IMMEDIATELY FOLLOWING A MEAL     Cardiovascular:  Beta Blockers Failed - 06/01/2022  3:18 AM      Failed - Valid encounter within last 6 months    Recent Outpatient Visits           1 year ago Diabetes mellitus without complication ([place])   [place] Pickard, Cammie Mcgee, MD   1 year ago Irregular heart beat   [place] Pickard, Cammie Mcgee, MD   2 years ago Essential hypertension   [place], Warren T, MD   2 years ago Iron deficiency anemia, unspecified iron deficiency anemia type   [place] Susy Frizzle, MD   2 years ago Shortness of breath   [place], Cammie Mcgee, MD       Future Appointments             In 5 months Mallipeddi, Quenten Raven, MD [facility] [place] BP in normal range    BP Readings from Last 1 Encounters:  05/15/22 132/60         Passed - Last Heart Rate in normal range    Pulse Readings from Last 1 Encounters:  05/15/22 77

## 2022-06-02 NOTE — Progress Notes (Signed)
Surgical Instructions    Your procedure is scheduled on Tuesday, 06/13/22.  Report to [facility], In Main Entrance "A" at 5:30 A.M., then check in with the Admitting office.  Call this number if you have problems the morning of surgery:  [phone]   If you have any questions prior to your surgery date call [phone]: Open Monday-Friday 8am-4pm If you experience any cold or flu symptoms such as cough, fever, chills, shortness of breath, etc. between now and your scheduled surgery, please notify us at the above number     Remember:  Do not eat or drink after midnight the night before your surgery     Take these medicines the morning of surgery with A SIP OF WATER:  allopurinol (ZYLOPRIM)  diltiazem (CARDIZEM CD)  doxazosin (CARDURA)  metoprolol succinate (TOPROL-XL)  pantoprazole (PROTONIX)  rosuvastatin (CRESTOR)   IF NEEDED: acetaminophen (TYLENOL)   As of today, STOP taking any Aspirin (unless otherwise instructed by your surgeon) Aleve, Naproxen, Ibuprofen, Motrin, Advil, Goody's, BC's, all herbal medications, fish oil, and all vitamins.  WHAT DO I DO ABOUT MY DIABETES MEDICATION?   Do not take oral diabetes medicines (pills) the morning of surgery.  Do not take Dulaglutide (TRULICITY) one week prior to surgery. Do not take a dose after 06/05/22.     THE MORNING OF SURGERY, do not take glipiZIDE (GLUCOTROL).  The day of surgery, do not take other diabetes injectables, including Byetta (exenatide), Bydureon (exenatide ER), Victoza (liraglutide), or Trulicity (dulaglutide).  If your CBG is greater than 220 mg/dL, you may take  of your sliding scale (correction) dose of insulin.   HOW TO MANAGE YOUR DIABETES BEFORE AND AFTER SURGERY  Why is it important to control my blood sugar before and after surgery? Improving blood sugar levels before and after surgery helps healing and can limit problems. A way of improving blood sugar control is eating a healthy diet by:  Eating  less sugar and carbohydrates  Increasing activity/exercise  Talking with your doctor about reaching your blood sugar goals High blood sugars (greater than 180 mg/dL) can raise your risk of infections and slow your recovery, so you will need to focus on controlling your diabetes during the weeks before surgery. Make sure that the doctor who takes care of your diabetes knows about your planned surgery including the date and location.  How do I manage my blood sugar before surgery? Check your blood sugar at least 4 times a day, starting 2 days before surgery, to make sure that the level is not too high or low.  Check your blood sugar the morning of your surgery when you wake up and every 2 hours until you get to the Short Stay unit.  If your blood sugar is less than 70 mg/dL, you will need to treat for low blood sugar: Do not take insulin. Treat a low blood sugar (less than 70 mg/dL) with  cup of clear juice (cranberry or apple), 4 glucose tablets, OR glucose gel. Recheck blood sugar in 15 minutes after treatment (to make sure it is greater than 70 mg/dL). If your blood sugar is not greater than 70 mg/dL on recheck, call [phone] for further instructions. Report your blood sugar to the short stay nurse when you get to Short Stay.  If you are admitted to the hospital after surgery: Your blood sugar will be checked by the staff and you will probably be given insulin after surgery (instead of oral diabetes medicines) to make sure you have  good blood sugar levels. The goal for blood sugar control after surgery is 80-180 mg/dL.            Do not wear jewelry or makeup. Do not wear lotions, powders, cologne or deodorant. Men may shave face and neck. Do not bring valuables to the hospital. Do not wear nail polish, gel polish, artificial nails, or any other type of covering on natural nails (fingers and toes) If you have artificial nails or gel coating that need to be removed by a nail salon,  please have this removed prior to surgery. Artificial nails or gel coating may interfere with anesthesia's ability to adequately monitor your vital signs.  [facility] is not responsible for any belongings or valuables.    Do NOT Smoke (Tobacco/Vaping)  24 hours prior to your procedure  If you use a CPAP at night, you may bring your mask for your overnight stay.   Contacts, glasses, hearing aids, dentures or partials may not be worn into surgery, please bring cases for these belongings   For patients admitted to the hospital, discharge time will be determined by your treatment team.   Patients discharged the day of surgery will not be allowed to drive home, and someone needs to stay with them for 24 hours.   SURGICAL WAITING ROOM VISITATION Patients having surgery or a procedure may have no more than 2 support people in the waiting area - these visitors may rotate.   Children under the age of 19 must have an adult with them who is not the patient. If the patient needs to stay at the hospital during part of their recovery, the visitor guidelines for inpatient rooms apply. Pre-op nurse will coordinate an appropriate time for 1 support person to accompany patient in pre-op.  This support person may not rotate.   Please refer to [email or web address] for the visitor guidelines for Inpatients (after your surgery is over and you are in a regular room).    Special instructions:    Oral Hygiene is also important to reduce your risk of infection.  Remember - BRUSH YOUR TEETH THE MORNING OF SURGERY WITH YOUR REGULAR TOOTHPASTE   [place] [facility]- Preparing For Surgery  Before surgery, you can play an important role. Because skin is not sterile, your skin needs to be as free of germs as possible. You can reduce the number of germs on your skin by washing with CHG (chlorahexidine gluconate) Soap before surgery.  CHG is an antiseptic cleaner which kills  germs and bonds with the skin to continue killing germs even after washing.     Please do not use if you have an allergy to CHG or antibacterial soaps. If your skin becomes reddened/irritated stop using the CHG.  Do not shave (including legs and underarms) for at least 48 hours prior to first CHG shower. It is OK to shave your face.  Please follow these instructions carefully.     Shower the NIGHT BEFORE SURGERY and the MORNING OF SURGERY with CHG Soap.   If you chose to wash your hair, wash your hair first as usual with your normal shampoo. After you shampoo, rinse your hair and body thoroughly to remove the shampoo.  Then [employer] and genitals (private parts) with your normal soap and rinse thoroughly to remove soap.  After that Use CHG Soap as you would any other liquid soap. You can apply CHG directly to the skin and wash gently with a scrungie or a clean washcloth.  Apply the CHG Soap to your body ONLY FROM THE NECK DOWN.  Do not use on open wounds or open sores. Avoid contact with your eyes, ears, mouth and genitals (private parts). Wash Face and genitals (private parts)  with your normal soap.   Wash thoroughly, paying special attention to the area where your surgery will be performed.  Thoroughly rinse your body with warm water from the neck down.  DO NOT shower/wash with your normal soap after using and rinsing off the CHG Soap.  Pat yourself dry with a CLEAN TOWEL.  Wear CLEAN PAJAMAS to bed the night before surgery  Place CLEAN SHEETS on your bed the night before your surgery  DO NOT SLEEP WITH PETS.   Day of Surgery: Take a shower with CHG soap. Wear Clean/Comfortable clothing the morning of surgery Do not apply any deodorants/lotions.   Remember to brush your teeth WITH YOUR REGULAR TOOTHPASTE.    If you received a COVID test during your pre-op visit, it is requested that you wear a mask when out in public, stay away from anyone that may not be feeling well, and  notify your surgeon if you develop symptoms. If you have been in contact with anyone that has tested positive in the last 10 days, please notify your surgeon.    Please read over the following fact sheets that you were given.

## 2022-06-05 ENCOUNTER — Encounter (HOSPITAL_COMMUNITY)
Admission: RE | Admit: 2022-06-05 | Discharge: 2022-06-05 | Disposition: A | Discharge status: Home / Self Care | Payer: Medicare HMO | Source: Ambulatory Visit | Attending: Vascular Surgery | Admitting: Vascular Surgery

## 2022-06-05 ENCOUNTER — Encounter (HOSPITAL_COMMUNITY): Payer: Self-pay

## 2022-06-05 ENCOUNTER — Other Ambulatory Visit: Payer: Self-pay

## 2022-06-05 VITALS — BP 134/65 | HR 86 | Temp 98.3°F | Resp 18 | Ht 71.0 in | Wt 248.0 lb

## 2022-06-05 DIAGNOSIS — I700 Atherosclerosis of aorta: Secondary | ICD-10-CM | POA: Diagnosis not present

## 2022-06-05 DIAGNOSIS — E785 Hyperlipidemia, unspecified: Secondary | ICD-10-CM | POA: Insufficient documentation

## 2022-06-05 DIAGNOSIS — I7143 Infrarenal abdominal aortic aneurysm, without rupture: Secondary | ICD-10-CM | POA: Diagnosis not present

## 2022-06-05 DIAGNOSIS — I129 Hypertensive chronic kidney disease with stage 1 through stage 4 chronic kidney disease, or unspecified chronic kidney disease: Secondary | ICD-10-CM | POA: Diagnosis not present

## 2022-06-05 DIAGNOSIS — N183 Chronic kidney disease, stage 3 unspecified: Secondary | ICD-10-CM | POA: Insufficient documentation

## 2022-06-05 DIAGNOSIS — Z955 Presence of coronary angioplasty implant and graft: Secondary | ICD-10-CM | POA: Diagnosis not present

## 2022-06-05 DIAGNOSIS — E0822 Diabetes mellitus due to underlying condition with diabetic chronic kidney disease: Secondary | ICD-10-CM | POA: Diagnosis not present

## 2022-06-05 DIAGNOSIS — N281 Cyst of kidney, acquired: Secondary | ICD-10-CM | POA: Diagnosis not present

## 2022-06-05 DIAGNOSIS — I739 Peripheral vascular disease, unspecified: Secondary | ICD-10-CM | POA: Insufficient documentation

## 2022-06-05 DIAGNOSIS — J439 Emphysema, unspecified: Secondary | ICD-10-CM | POA: Insufficient documentation

## 2022-06-05 DIAGNOSIS — K5730 Diverticulosis of large intestine without perforation or abscess without bleeding: Secondary | ICD-10-CM | POA: Diagnosis not present

## 2022-06-05 DIAGNOSIS — I2510 Atherosclerotic heart disease of native coronary artery without angina pectoris: Secondary | ICD-10-CM | POA: Insufficient documentation

## 2022-06-05 DIAGNOSIS — E119 Type 2 diabetes mellitus without complications: Secondary | ICD-10-CM

## 2022-06-05 DIAGNOSIS — Z01812 Encounter for preprocedural laboratory examination: Secondary | ICD-10-CM | POA: Diagnosis not present

## 2022-06-05 DIAGNOSIS — Z01818 Encounter for other preprocedural examination: Secondary | ICD-10-CM

## 2022-06-05 HISTORY — DX: Sleep apnea, unspecified: G47.30

## 2022-06-05 HISTORY — DX: Unspecified osteoarthritis, unspecified site: M19.90

## 2022-06-05 LAB — COMPREHENSIVE METABOLIC PANEL
ALT: 22 U/L (ref 0–44)
AST: 23 U/L (ref 15–41)
Albumin: 3.5 g/dL (ref 3.5–5.0)
Alkaline Phosphatase: 71 U/L (ref 38–126)
Anion gap: 14 (ref 5–15)
BUN: 17 mg/dL (ref 8–23)
CO2: 24 mmol/L (ref 22–32)
Calcium: 9 mg/dL (ref 8.9–10.3)
Chloride: 99 mmol/L (ref 98–111)
Creatinine, Ser: 1.62 mg/dL — ABNORMAL HIGH (ref 0.61–1.24)
GFR, Estimated: 42 mL/min — ABNORMAL LOW (ref >60)
Glucose, Bld: 248 mg/dL — ABNORMAL HIGH (ref 70–99)
Potassium: 4.1 mmol/L (ref 3.5–5.1)
Sodium: 137 mmol/L (ref 135–145)
Total Bilirubin: 0.5 mg/dL (ref 0.3–1.2)
Total Protein: 6.3 g/dL — ABNORMAL LOW (ref 6.5–8.1)

## 2022-06-05 LAB — URINALYSIS, ROUTINE W REFLEX MICROSCOPIC
Bacteria, UA: NONE SEEN
Bilirubin Urine: NEGATIVE
Glucose, UA: NEGATIVE mg/dL
Hgb urine dipstick: NEGATIVE
Ketones, ur: NEGATIVE mg/dL
Leukocytes,Ua: NEGATIVE
Nitrite: NEGATIVE
Protein, ur: 100 mg/dL — AB
Specific Gravity, Urine: 1.008 (ref 1.005–1.030)
pH: 5 (ref 5.0–8.0)

## 2022-06-05 LAB — CBC
HCT: 36.4 % — ABNORMAL LOW (ref 39.0–52.0)
Hemoglobin: 11.9 g/dL — ABNORMAL LOW (ref 13.0–17.0)
MCH: 30.9 pg (ref 26.0–34.0)
MCHC: 32.7 g/dL (ref 30.0–36.0)
MCV: 94.5 fL (ref 80.0–100.0)
Platelets: 125 10*3/uL — ABNORMAL LOW (ref 150–400)
RBC: 3.85 MIL/uL — ABNORMAL LOW (ref 4.22–5.81)
RDW: 13.3 % (ref 11.5–15.5)
WBC: 7 10*3/uL (ref 4.0–10.5)
nRBC: 0 % (ref 0.0–0.2)

## 2022-06-05 LAB — TYPE AND SCREEN
ABO/RH(D): O POS
Antibody Screen: NEGATIVE

## 2022-06-05 LAB — PROTIME-INR
INR: 1.1 (ref 0.8–1.2)
Prothrombin Time: 13.8 seconds (ref 11.4–15.2)

## 2022-06-05 LAB — GLUCOSE, CAPILLARY: Glucose-Capillary: 256 mg/dL — ABNORMAL HIGH (ref 70–99)

## 2022-06-05 LAB — SURGICAL PCR SCREEN
MRSA, PCR: NEGATIVE
Staphylococcus aureus: NEGATIVE

## 2022-06-05 LAB — HEMOGLOBIN A1C
Hgb A1c MFr Bld: 7.9 % — ABNORMAL HIGH (ref 4.8–5.6)
Mean Plasma Glucose: 180 mg/dL

## 2022-06-05 LAB — APTT: aPTT: 35 seconds (ref 24–36)

## 2022-06-05 NOTE — Progress Notes (Signed)
PCP - Dr. Jenna Luo Cardiologist - Gerrie Nordmann NP  PPM/ICD - Denies Device Orders -  Rep Notified -   Chest x-ray - N/A EKG - 05/16/22 Stress Test - 01/24/18 ECHO - 05/17/22 Cardiac Cath - Denies  Sleep Study - OSA CPAP - No  Fasting Blood Sugar - 120-130 Checks Blood Sugar __1-2___ times a day  Last dose of GLP1 agonist-  05/31/22 GLP1 instructions: Stop 1 week prior to surgery.  Blood Thinner Instructions: N/A Aspirin Instructions: Continue  ERAS Protcol - No  COVID TEST- N/A   Anesthesia review: Yes, cardiac hx; Clearance 05/19/22 in Epic  Patient denies shortness of breath, fever, cough and chest pain at PAT appointment   All instructions explained to the patient, with a verbal understanding of the material. Patient agrees to go over the instructions while at home for a better understanding. Patient also instructed to self quarantine after being tested for COVID-19. The opportunity to ask questions was provided.

## 2022-06-06 NOTE — Anesthesia Preprocedure Evaluation (Addendum)
Anesthesia Evaluation  Patient identified by MRN, date of birth, ID band Patient awake    Reviewed: Allergy & Precautions, NPO status , Patient's Chart, lab work & pertinent test results  Airway Mallampati: II  TM Distance: >3 FB Neck ROM: Full    Dental  (+) Edentulous Upper, Edentulous Lower   Pulmonary sleep apnea , former smoker   Pulmonary exam normal        Cardiovascular hypertension, Pt. on medications and Pt. on home beta blockers + CAD and + Peripheral Vascular Disease   Rhythm:Regular Rate:Normal  Infrarenal AAA   Neuro/Psych negative neurological ROS  negative psych ROS   GI/Hepatic Neg liver ROS,GERD  Medicated,,  Endo/Other  diabetes, Type 2, Insulin Dependent, Oral Hypoglycemic Agents    Renal/GU      Musculoskeletal   Abdominal Normal abdominal exam  (+)   Peds  Hematology   Anesthesia Other Findings   Reproductive/Obstetrics                             Anesthesia Physical Anesthesia Plan  ASA: 3  Anesthesia Plan: General   Post-op Pain Management:    Induction: Intravenous  PONV Risk Score and Plan: 2 and Ondansetron, Dexamethasone and Treatment may vary due to age or medical condition  Airway Management Planned: Mask and Oral ETT  Additional Equipment: Arterial line  Intra-op Plan:   Post-operative Plan: Extubation in OR  Informed Consent: I have reviewed the patients History and Physical, chart, labs and discussed the procedure including the risks, benefits and alternatives for the proposed anesthesia with the patient or authorized representative who has indicated his/her understanding and acceptance.     Dental advisory given  Plan Discussed with: CRNA  Anesthesia Plan Comments: (PAT note by Karoline Caldwell, PA-C:  Follows with cardiology for history of CAD s/p remote angioplasty in 1989, HTN, HLD, PAD, AAA, who is here today for follow as well as  needing cardiac clearance for EVAR.  Last seen by Gerrie Nordmann, NP 05/15/2022 for preop evaluation.  Per note, "Preoperative clearance for EVAR with RCRI and DASI completed during this visit today his perioperative risk of major cardiac event is 11% according to the revised cardiac risk index therefore he is at high risk for perioperative complications.  His functional capacity is fair at 5.9 METS according to [facility] activity status index but with his worsening dyspnea he has been scheduled for an echocardiogram to be completed before his disposition can be completed regarding surgical risk."  Echo was subsequent completed on 05/17/2022 and Gerrie Nordmann, NP commented on result stating, "Heart squeeze remains strong at 65-70%, some muscle stiffness noted related to hypertension, mild aortic stenosis, no significant change noted from prior study. Patient is OK to proceed with surgery at moderate risk. Please send clearance to Vascular and notify patient of stable findings."  History of CKD stage III  IDDM 2, A1c 7.9 on preop labs.  Preop labs reviewed, creatinine elevated 1.62 consistent with history of CKD, mild anemia with hemoglobin 11.9.  EKG 05/16/2022: Sinus rhythm with first-degree AV block.  Rate 77.  CTA chest abdomen pelvis 05/04/2022: IMPRESSION: 1. Infrarenal abdominal aortic aneurysm has enlarged and now measures up to 5.7 cm. Aneurysm measured 5.1 cm on 01/11/2021. Stable morphology of the infrarenal abdominal aortic aneurysm. 2. 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 3. 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 4. Colonic diverticulosis without acute bowel inflammation. 5. Aortic Atherosclerosis (ICD10-I70.0)  and Emphysema (ICD10-J43.9). 6. Bilateral renal cysts that do not require dedicated follow-up.  Carotid duplex 12/30/2020: Summary:  Right Carotid: Velocities in the right ICA are consistent with a 1-39% stenosis.  Left Carotid: Velocities in the left ICA are  consistent with a 1-39% stenosis.  Vertebrals:Bilateral vertebral arteries demonstrate antegrade flow.  Subclavians: Normal flow hemodynamics were seen in bilateral subclavian arteries.   TTE 05/17/2022: 1. Left ventricular ejection fraction, by estimation, is 65 to 70%. The  left ventricle has normal function. Left ventricular endocardial border  not optimally defined to evaluate regional wall motion. There is moderate  left ventricular hypertrophy. Left  ventricular diastolic parameters are consistent with Grade I diastolic  dysfunction (impaired relaxation). The average left ventricular global  longitudinal strain is -19.3 %. The global longitudinal strain is normal.  2. Right ventricular systolic function is normal. The right ventricular  size is normal. There is normal pulmonary artery systolic pressure.  3. The mitral valve is grossly normal. No evidence of mitral valve  regurgitation. No evidence of mitral stenosis.  4. The aortic valve is calcified. There is moderate calcification of the  aortic valve. Aortic valve regurgitation is not visualized. Mild aortic  valve stenosis. Aortic valve area, by VTI measures 1.66 cm. Aortic valve  mean gradient measures 13.0 mmHg.  Aortic valve Vmax measures 2.63 m/s.  5. The inferior vena cava is normal in size with greater than 50%  respiratory variability, suggesting right atrial pressure of 3 mmHg.   Comparison(s): No significant change from prior study. Stable LVEF and  mild AS.   Nuclear stress 01/24/2018: ? There was no ST segment deviation noted during stress. ? The study is normal. No myocardial ischemia or scar. ? This is a low risk study. ? Nuclear stress EF: 71%.  )        Anesthesia Quick Evaluation

## 2022-06-06 NOTE — Progress Notes (Signed)
Anesthesia Chart Review:  Follows with cardiology for history of CAD s/p remote angioplasty in 1989, HTN, HLD, PAD, AAA, who is here today for follow as well as needing cardiac clearance for EVAR.  Last seen by Gerrie Nordmann, NP 05/15/2022 for preop evaluation.  Per note, "Preoperative clearance for EVAR with RCRI and DASI completed during this visit today his perioperative risk of major cardiac event is 11% according to the revised cardiac risk index therefore he is at high risk for perioperative complications.  His functional capacity is fair at 5.9 METS according to [facility] activity status index but with his worsening dyspnea he has been scheduled for an echocardiogram to be completed before his disposition can be completed regarding surgical risk."  Echo was subsequent completed on 05/17/2022 and Gerrie Nordmann, NP commented on result stating, "Heart squeeze remains strong at 65-70%, some muscle stiffness noted related to hypertension, mild aortic stenosis, no significant change noted from prior study. Patient is OK to proceed with surgery at moderate risk. Please send clearance to Vascular and notify patient of stable findings."  History of CKD stage III  IDDM 2, A1c 7.9 on preop labs.  Preop labs reviewed, creatinine elevated 1.62 consistent with history of CKD, mild anemia with hemoglobin 11.9.  EKG 05/16/2022: Sinus rhythm with first-degree AV block.  Rate 77.  CTA chest abdomen pelvis 05/04/2022: IMPRESSION: 1. Infrarenal abdominal aortic aneurysm has enlarged and now measures up to 5.7 cm. Aneurysm measured 5.1 cm on 01/11/2021. Stable morphology of the infrarenal abdominal aortic aneurysm. 2. 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 3. 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 4. Colonic diverticulosis without acute bowel inflammation. 5. Aortic Atherosclerosis (ICD10-I70.0) and Emphysema (ICD10-J43.9). 6. Bilateral renal cysts that do not require dedicated  follow-up.  Carotid duplex 12/30/2020: Summary:  Right Carotid: Velocities in the right ICA are consistent with a 1-39% stenosis.  Left Carotid: Velocities in the left ICA are consistent with a 1-39% stenosis.  Vertebrals: Bilateral vertebral arteries demonstrate antegrade flow.  Subclavians: Normal flow hemodynamics were seen in bilateral subclavian arteries.   TTE 05/17/2022:  1. Left ventricular ejection fraction, by estimation, is 65 to 70%. The  left ventricle has normal function. Left ventricular endocardial border  not optimally defined to evaluate regional wall motion. There is moderate  left ventricular hypertrophy. Left  ventricular diastolic parameters are consistent with Grade I diastolic  dysfunction (impaired relaxation). The average left ventricular global  longitudinal strain is -19.3 %. The global longitudinal strain is normal.   2. Right ventricular systolic function is normal. The right ventricular  size is normal. There is normal pulmonary artery systolic pressure.   3. The mitral valve is grossly normal. No evidence of mitral valve  regurgitation. No evidence of mitral stenosis.   4. The aortic valve is calcified. There is moderate calcification of the  aortic valve. Aortic valve regurgitation is not visualized. Mild aortic  valve stenosis. Aortic valve area, by VTI measures 1.66 cm. Aortic valve  mean gradient measures 13.0 mmHg.  Aortic valve Vmax measures 2.63 m/s.   5. The inferior vena cava is normal in size with greater than 50%  respiratory variability, suggesting right atrial pressure of 3 mmHg.   Comparison(s): No significant change from prior study. Stable LVEF and  mild AS.   Nuclear stress 01/24/2018: There was no ST segment deviation noted during stress. The study is normal. No myocardial ischemia or scar. This is a low risk study. Nuclear stress EF: 71%.  Juan [name] [facility]/Anesthesiology Phone [phone] 06/06/2022  2:49 PM

## 2022-06-09 ENCOUNTER — Other Ambulatory Visit: Payer: Self-pay | Admitting: Family Medicine

## 2022-06-11 ENCOUNTER — Other Ambulatory Visit: Payer: Self-pay | Admitting: Family Medicine

## 2022-06-12 NOTE — Telephone Encounter (Signed)
Patient will need an office visit for further refills. Requested Prescriptions  Pending Prescriptions Disp Refills   glipiZIDE (GLUCOTROL) 5 MG tablet [Pharmacy Med Name: GLIPIZIDE 5 MG Tablet] 180 tablet 0    Sig: TAKE 1 TABLET TWICE DAILY BEFORE MEALS     Endocrinology:  Diabetes - Sulfonylureas Failed - 06/11/2022  3:07 AM      Failed - Cr in normal range and within 360 days    Creat  Date Value Ref Range Status  12/08/2021 1.52 (H) 0.70 - 1.22 mg/dL Final   Creatinine, Ser  Date Value Ref Range Status  06/05/2022 1.62 (H) 0.61 - 1.24 mg/dL Final   Creatinine, Urine  Date Value Ref Range Status  05/14/2020 116 20 - 320 mg/dL Final         Failed - Valid encounter within last 6 months    Recent Outpatient Visits           1 year ago Diabetes mellitus without complication ([place])   [place] Pickard, Cammie Mcgee, MD   1 year ago Irregular heart beat   [place] Pickard, Cammie Mcgee, MD   2 years ago Essential hypertension   [place], Warren T, MD   2 years ago Iron deficiency anemia, unspecified iron deficiency anemia type   [place] Pickard, Cammie Mcgee, MD   2 years ago Shortness of breath   [place] Pickard, Cammie Mcgee, MD       Future Appointments             In 5 months Mallipeddi, Quenten Raven, MD [facility], [place] [facility]            Passed - HBA1C is between 0 and 7.9 and within 180 days    Hgb A1c MFr Bld  Date Value Ref Range Status  06/05/2022 7.9 (H) 4.8 - 5.6 % Final    Comment:    (NOTE)         Prediabetes: 5.7 - 6.4         Diabetes: >6.4         Glycemic control for adults with diabetes: <7.0

## 2022-06-13 ENCOUNTER — Other Ambulatory Visit: Payer: Self-pay

## 2022-06-13 ENCOUNTER — Inpatient Hospital Stay (HOSPITAL_COMMUNITY): Payer: Medicare HMO | Admitting: Certified Registered"

## 2022-06-13 ENCOUNTER — Encounter (HOSPITAL_COMMUNITY)
Admission: RE | Disposition: A | Discharge status: Home / Self Care | Payer: Self-pay | Source: Home / Self Care | Attending: Vascular Surgery

## 2022-06-13 ENCOUNTER — Inpatient Hospital Stay (HOSPITAL_COMMUNITY)
Admission: RE | Admit: 2022-06-13 | Discharge: 2022-06-14 | DRG: 269 | Disposition: A | Discharge status: Home / Self Care | Payer: Medicare HMO | Attending: Vascular Surgery | Admitting: Vascular Surgery

## 2022-06-13 ENCOUNTER — Encounter (HOSPITAL_COMMUNITY): Payer: Self-pay | Admitting: Vascular Surgery

## 2022-06-13 ENCOUNTER — Inpatient Hospital Stay (HOSPITAL_COMMUNITY): Payer: Medicare HMO

## 2022-06-13 ENCOUNTER — Inpatient Hospital Stay (HOSPITAL_COMMUNITY): Payer: Medicare HMO | Admitting: Physician Assistant

## 2022-06-13 DIAGNOSIS — Z79899 Other long term (current) drug therapy: Secondary | ICD-10-CM

## 2022-06-13 DIAGNOSIS — M549 Dorsalgia, unspecified: Secondary | ICD-10-CM | POA: Diagnosis not present

## 2022-06-13 DIAGNOSIS — G4733 Obstructive sleep apnea (adult) (pediatric): Secondary | ICD-10-CM | POA: Diagnosis present

## 2022-06-13 DIAGNOSIS — E785 Hyperlipidemia, unspecified: Secondary | ICD-10-CM | POA: Diagnosis not present

## 2022-06-13 DIAGNOSIS — I6521 Occlusion and stenosis of right carotid artery: Secondary | ICD-10-CM | POA: Diagnosis not present

## 2022-06-13 DIAGNOSIS — E1122 Type 2 diabetes mellitus with diabetic chronic kidney disease: Secondary | ICD-10-CM | POA: Diagnosis present

## 2022-06-13 DIAGNOSIS — E119 Type 2 diabetes mellitus without complications: Secondary | ICD-10-CM

## 2022-06-13 DIAGNOSIS — I7143 Infrarenal abdominal aortic aneurysm, without rupture: Secondary | ICD-10-CM

## 2022-06-13 DIAGNOSIS — I2510 Atherosclerotic heart disease of native coronary artery without angina pectoris: Secondary | ICD-10-CM | POA: Diagnosis present

## 2022-06-13 DIAGNOSIS — Z833 Family history of diabetes mellitus: Secondary | ICD-10-CM | POA: Diagnosis not present

## 2022-06-13 DIAGNOSIS — G2581 Restless legs syndrome: Secondary | ICD-10-CM | POA: Diagnosis not present

## 2022-06-13 DIAGNOSIS — Z7984 Long term (current) use of oral hypoglycemic drugs: Secondary | ICD-10-CM

## 2022-06-13 DIAGNOSIS — Z808 Family history of malignant neoplasm of other organs or systems: Secondary | ICD-10-CM

## 2022-06-13 DIAGNOSIS — Z9861 Coronary angioplasty status: Secondary | ICD-10-CM

## 2022-06-13 DIAGNOSIS — Z7982 Long term (current) use of aspirin: Secondary | ICD-10-CM

## 2022-06-13 DIAGNOSIS — E1140 Type 2 diabetes mellitus with diabetic neuropathy, unspecified: Secondary | ICD-10-CM | POA: Diagnosis present

## 2022-06-13 DIAGNOSIS — M1990 Unspecified osteoarthritis, unspecified site: Secondary | ICD-10-CM | POA: Diagnosis present

## 2022-06-13 DIAGNOSIS — I10 Essential (primary) hypertension: Secondary | ICD-10-CM | POA: Diagnosis not present

## 2022-06-13 DIAGNOSIS — Z888 Allergy status to other drugs, medicaments and biological substances status: Secondary | ICD-10-CM

## 2022-06-13 DIAGNOSIS — E1151 Type 2 diabetes mellitus with diabetic peripheral angiopathy without gangrene: Secondary | ICD-10-CM | POA: Diagnosis present

## 2022-06-13 DIAGNOSIS — Z8612 Personal history of poliomyelitis: Secondary | ICD-10-CM | POA: Diagnosis not present

## 2022-06-13 DIAGNOSIS — I129 Hypertensive chronic kidney disease with stage 1 through stage 4 chronic kidney disease, or unspecified chronic kidney disease: Secondary | ICD-10-CM | POA: Diagnosis present

## 2022-06-13 DIAGNOSIS — I714 Abdominal aortic aneurysm, without rupture, unspecified: Secondary | ICD-10-CM | POA: Diagnosis present

## 2022-06-13 DIAGNOSIS — Z87891 Personal history of nicotine dependence: Secondary | ICD-10-CM

## 2022-06-13 DIAGNOSIS — Z8249 Family history of ischemic heart disease and other diseases of the circulatory system: Secondary | ICD-10-CM

## 2022-06-13 DIAGNOSIS — Z794 Long term (current) use of insulin: Secondary | ICD-10-CM

## 2022-06-13 DIAGNOSIS — Z86010 Personal history of colonic polyps: Secondary | ICD-10-CM | POA: Diagnosis not present

## 2022-06-13 DIAGNOSIS — N183 Chronic kidney disease, stage 3 unspecified: Secondary | ICD-10-CM | POA: Diagnosis not present

## 2022-06-13 DIAGNOSIS — Z7985 Long-term (current) use of injectable non-insulin antidiabetic drugs: Secondary | ICD-10-CM | POA: Diagnosis not present

## 2022-06-13 HISTORY — PX: ULTRASOUND GUIDANCE FOR VASCULAR ACCESS: SHX6516

## 2022-06-13 HISTORY — PX: ABDOMINAL AORTIC ENDOVASCULAR STENT GRAFT: SHX5707

## 2022-06-13 LAB — CBC
HCT: 33.4 % — ABNORMAL LOW (ref 39.0–52.0)
Hemoglobin: 11 g/dL — ABNORMAL LOW (ref 13.0–17.0)
MCH: 31.3 pg (ref 26.0–34.0)
MCHC: 32.9 g/dL (ref 30.0–36.0)
MCV: 94.9 fL (ref 80.0–100.0)
Platelets: 99 10*3/uL — ABNORMAL LOW (ref 150–400)
RBC: 3.52 MIL/uL — ABNORMAL LOW (ref 4.22–5.81)
RDW: 13.4 % (ref 11.5–15.5)
WBC: 5.6 10*3/uL (ref 4.0–10.5)
nRBC: 0 % (ref 0.0–0.2)

## 2022-06-13 LAB — CREATININE, SERUM
Creatinine, Ser: 1.39 mg/dL — ABNORMAL HIGH (ref 0.61–1.24)
GFR, Estimated: 50 mL/min — ABNORMAL LOW (ref >60)

## 2022-06-13 LAB — POCT ACTIVATED CLOTTING TIME
Activated Clotting Time: 239 seconds
Activated Clotting Time: 228 seconds

## 2022-06-13 LAB — GLUCOSE, CAPILLARY
Glucose-Capillary: 159 mg/dL — ABNORMAL HIGH (ref 70–99)
Glucose-Capillary: 155 mg/dL — ABNORMAL HIGH (ref 70–99)

## 2022-06-13 LAB — ABO/RH: ABO/RH(D): O POS

## 2022-06-13 SURGERY — INSERTION, ENDOVASCULAR STENT GRAFT, AORTA, ABDOMINAL
Anesthesia: General | Site: Groin | Laterality: Bilateral

## 2022-06-13 MED ORDER — HYDROMORPHONE HCL 1 MG/ML IJ SOLN: 0.5000 mg | INTRAMUSCULAR | Status: DC | PRN

## 2022-06-13 MED ORDER — METOPROLOL TARTRATE 5 MG/5ML IV SOLN: 2.0000 mg | INTRAVENOUS | Status: DC | PRN

## 2022-06-13 MED ORDER — PROPOFOL 10 MG/ML IV BOLUS
INTRAVENOUS | Status: AC
  Filled 2022-06-13: qty 20

## 2022-06-13 MED ORDER — DILTIAZEM HCL ER COATED BEADS 120 MG PO CP24
240.0000 mg | ORAL_CAPSULE | Freq: Every day | ORAL | Status: DC
  Administered 2022-06-13 – 2022-06-14 (×2): 240 mg via ORAL
  Filled 2022-06-13 (×2): qty 2

## 2022-06-13 MED ORDER — FENTANYL CITRATE (PF) 250 MCG/5ML IJ SOLN
INTRAMUSCULAR | Status: DC | PRN
  Administered 2022-06-13 (×2): 50 ug via INTRAVENOUS
  Administered 2022-06-13: 100 ug via INTRAVENOUS
  Administered 2022-06-13: 50 ug via INTRAVENOUS

## 2022-06-13 MED ORDER — MAGNESIUM SULFATE 2 GM/50ML IV SOLN: 2.0000 g | Freq: Every day | INTRAVENOUS | Status: DC | PRN

## 2022-06-13 MED ORDER — HYDROMORPHONE HCL 1 MG/ML IJ SOLN
INTRAMUSCULAR | Status: AC
  Filled 2022-06-13: qty 0.5

## 2022-06-13 MED ORDER — LIDOCAINE 2% (20 MG/ML) 5 ML SYRINGE
INTRAMUSCULAR | Status: DC | PRN
  Administered 2022-06-13: 60 mg via INTRAVENOUS

## 2022-06-13 MED ORDER — SODIUM CHLORIDE 0.9 % IV SOLN: 500.0000 mL | Freq: Once | INTRAVENOUS | Status: DC | PRN

## 2022-06-13 MED ORDER — HYDROMORPHONE HCL 1 MG/ML IJ SOLN
INTRAMUSCULAR | Status: DC | PRN
  Administered 2022-06-13: .5 mg via INTRAVENOUS

## 2022-06-13 MED ORDER — LACTATED RINGERS IV SOLN: INTRAVENOUS | Status: DC

## 2022-06-13 MED ORDER — SODIUM CHLORIDE 0.9 % IV SOLN: INTRAVENOUS | Status: DC

## 2022-06-13 MED ORDER — CHLORHEXIDINE GLUCONATE 0.12 % MT SOLN
15.0000 mL | Freq: Once | OROMUCOSAL | Status: AC
  Administered 2022-06-13: 15 mL via OROMUCOSAL
  Filled 2022-06-13: qty 15

## 2022-06-13 MED ORDER — PROPOFOL 10 MG/ML IV BOLUS
INTRAVENOUS | Status: DC | PRN
  Administered 2022-06-13: 50 mg via INTRAVENOUS
  Administered 2022-06-13: 150 mg via INTRAVENOUS

## 2022-06-13 MED ORDER — ORAL CARE MOUTH RINSE: 15.0000 mL | Freq: Once | OROMUCOSAL | Status: AC

## 2022-06-13 MED ORDER — ROCURONIUM BROMIDE 10 MG/ML (PF) SYRINGE
PREFILLED_SYRINGE | INTRAVENOUS | Status: DC | PRN
  Administered 2022-06-13: 60 mg via INTRAVENOUS

## 2022-06-13 MED ORDER — SUGAMMADEX SODIUM 200 MG/2ML IV SOLN
INTRAVENOUS | Status: DC | PRN
  Administered 2022-06-13: 200 mg via INTRAVENOUS

## 2022-06-13 MED ORDER — METOPROLOL SUCCINATE ER 50 MG PO TB24
50.0000 mg | ORAL_TABLET | Freq: Every day | ORAL | Status: DC
  Administered 2022-06-14: 50 mg via ORAL
  Filled 2022-06-13: qty 1

## 2022-06-13 MED ORDER — DOXAZOSIN MESYLATE 2 MG PO TABS
8.0000 mg | ORAL_TABLET | Freq: Every day | ORAL | Status: DC
  Administered 2022-06-13 – 2022-06-14 (×2): 8 mg via ORAL
  Filled 2022-06-13 (×2): qty 4

## 2022-06-13 MED ORDER — LABETALOL HCL 5 MG/ML IV SOLN
10.0000 mg | INTRAVENOUS | Status: AC | PRN
  Administered 2022-06-13 (×4): 10 mg via INTRAVENOUS
  Filled 2022-06-13 (×3): qty 4

## 2022-06-13 MED ORDER — ONDANSETRON HCL 4 MG/2ML IJ SOLN
INTRAMUSCULAR | Status: DC | PRN
  Administered 2022-06-13: 4 mg via INTRAVENOUS

## 2022-06-13 MED ORDER — CHLORHEXIDINE GLUCONATE CLOTH 2 % EX PADS: 6.0000 | MEDICATED_PAD | Freq: Once | CUTANEOUS | Status: DC

## 2022-06-13 MED ORDER — ACETAMINOPHEN 325 MG RE SUPP: 325.0000 mg | RECTAL | Status: DC | PRN

## 2022-06-13 MED ORDER — PROTAMINE SULFATE 10 MG/ML IV SOLN
INTRAVENOUS | Status: DC | PRN
  Administered 2022-06-13: 50 mg via INTRAVENOUS

## 2022-06-13 MED ORDER — DOCUSATE SODIUM 100 MG PO CAPS
100.0000 mg | ORAL_CAPSULE | Freq: Every day | ORAL | Status: DC
  Administered 2022-06-14: 100 mg via ORAL
  Filled 2022-06-13: qty 1

## 2022-06-13 MED ORDER — EPHEDRINE SULFATE-NACL 50-0.9 MG/10ML-% IV SOSY
PREFILLED_SYRINGE | INTRAVENOUS | Status: DC | PRN
  Administered 2022-06-13: 10 mg via INTRAVENOUS

## 2022-06-13 MED ORDER — ROSUVASTATIN CALCIUM 20 MG PO TABS
20.0000 mg | ORAL_TABLET | Freq: Every day | ORAL | Status: DC
  Administered 2022-06-13 – 2022-06-14 (×2): 20 mg via ORAL
  Filled 2022-06-13 (×2): qty 1

## 2022-06-13 MED ORDER — ASPIRIN 81 MG PO TBEC
81.0000 mg | DELAYED_RELEASE_TABLET | Freq: Every day | ORAL | Status: DC
  Administered 2022-06-13 – 2022-06-14 (×2): 81 mg via ORAL
  Filled 2022-06-13 (×2): qty 1

## 2022-06-13 MED ORDER — PANTOPRAZOLE SODIUM 20 MG PO TBEC
20.0000 mg | DELAYED_RELEASE_TABLET | Freq: Every day | ORAL | Status: DC
  Administered 2022-06-14: 20 mg via ORAL
  Filled 2022-06-13: qty 1

## 2022-06-13 MED ORDER — PANTOPRAZOLE SODIUM 40 MG PO TBEC
40.0000 mg | DELAYED_RELEASE_TABLET | Freq: Every day | ORAL | Status: DC
  Administered 2022-06-13: 40 mg via ORAL
  Filled 2022-06-13: qty 1

## 2022-06-13 MED ORDER — HYDRALAZINE HCL 20 MG/ML IJ SOLN
5.0000 mg | INTRAMUSCULAR | Status: DC | PRN
  Administered 2022-06-13: 5 mg via INTRAVENOUS
  Filled 2022-06-13: qty 1

## 2022-06-13 MED ORDER — HEPARIN SODIUM (PORCINE) 1000 UNIT/ML IJ SOLN
INTRAMUSCULAR | Status: AC
  Filled 2022-06-13: qty 20

## 2022-06-13 MED ORDER — LABETALOL HCL 5 MG/ML IV SOLN
INTRAVENOUS | Status: DC | PRN
  Administered 2022-06-13: 5 mg via INTRAVENOUS
  Administered 2022-06-13: 10 mg via INTRAVENOUS

## 2022-06-13 MED ORDER — FENTANYL CITRATE (PF) 250 MCG/5ML IJ SOLN
INTRAMUSCULAR | Status: AC
  Filled 2022-06-13: qty 5

## 2022-06-13 MED ORDER — FENTANYL CITRATE (PF) 100 MCG/2ML IJ SOLN
INTRAMUSCULAR | Status: AC
  Filled 2022-06-13: qty 2

## 2022-06-13 MED ORDER — ACETAMINOPHEN 325 MG PO TABS: 325.0000 mg | ORAL_TABLET | ORAL | Status: DC | PRN

## 2022-06-13 MED ORDER — POTASSIUM CHLORIDE CRYS ER 20 MEQ PO TBCR: 20.0000 meq | EXTENDED_RELEASE_TABLET | Freq: Every day | ORAL | Status: DC | PRN

## 2022-06-13 MED ORDER — ALUM & MAG HYDROXIDE-SIMETH 200-200-20 MG/5ML PO SUSP: 15.0000 mL | ORAL | Status: DC | PRN

## 2022-06-13 MED ORDER — FENTANYL CITRATE (PF) 100 MCG/2ML IJ SOLN
25.0000 ug | INTRAMUSCULAR | Status: DC | PRN
  Administered 2022-06-13: 50 ug via INTRAVENOUS

## 2022-06-13 MED ORDER — ONDANSETRON HCL 4 MG/2ML IJ SOLN: 4.0000 mg | Freq: Four times a day (QID) | INTRAMUSCULAR | Status: DC | PRN

## 2022-06-13 MED ORDER — ORAL CARE MOUTH RINSE: 15.0000 mL | Freq: Once | OROMUCOSAL | Status: DC

## 2022-06-13 MED ORDER — PHENYLEPHRINE HCL-NACL 20-0.9 MG/250ML-% IV SOLN
INTRAVENOUS | Status: DC | PRN
  Administered 2022-06-13: 30 ug/min via INTRAVENOUS

## 2022-06-13 MED ORDER — IODIXANOL 320 MG/ML IV SOLN
INTRAVENOUS | Status: DC | PRN
  Administered 2022-06-13: 48 mL via INTRA_ARTERIAL

## 2022-06-13 MED ORDER — HEPARIN SODIUM (PORCINE) 1000 UNIT/ML IJ SOLN
INTRAMUSCULAR | Status: DC | PRN
  Administered 2022-06-13: 11000 [IU] via INTRAVENOUS
  Administered 2022-06-13: 1000 [IU] via INTRAVENOUS

## 2022-06-13 MED ORDER — INSULIN ASPART 100 UNIT/ML IJ SOLN: 0.0000 [IU] | INTRAMUSCULAR | Status: DC | PRN

## 2022-06-13 MED ORDER — HEPARIN SODIUM (PORCINE) 5000 UNIT/ML IJ SOLN
5000.0000 [IU] | Freq: Three times a day (TID) | INTRAMUSCULAR | Status: DC
  Administered 2022-06-13 – 2022-06-14 (×3): 5000 [IU] via SUBCUTANEOUS
  Filled 2022-06-13 (×3): qty 1

## 2022-06-13 MED ORDER — CEFAZOLIN SODIUM-DEXTROSE 2-4 GM/100ML-% IV SOLN
2.0000 g | INTRAVENOUS | Status: AC
  Administered 2022-06-13: 2 g via INTRAVENOUS
  Filled 2022-06-13: qty 100

## 2022-06-13 MED ORDER — 0.9 % SODIUM CHLORIDE (POUR BTL) OPTIME
TOPICAL | Status: DC | PRN
  Administered 2022-06-13: 1000 mL

## 2022-06-13 MED ORDER — DEXMEDETOMIDINE HCL IN NACL 80 MCG/20ML IV SOLN
INTRAVENOUS | Status: DC | PRN
  Administered 2022-06-13: 12 ug via INTRAVENOUS

## 2022-06-13 MED ORDER — OXYCODONE HCL 5 MG PO TABS
5.0000 mg | ORAL_TABLET | ORAL | Status: DC | PRN
  Administered 2022-06-13: 10 mg via ORAL
  Filled 2022-06-13: qty 2

## 2022-06-13 MED ORDER — METOPROLOL SUCCINATE ER 25 MG PO TB24
50.0000 mg | ORAL_TABLET | Freq: Once | ORAL | Status: AC
  Administered 2022-06-13: 50 mg via ORAL
  Filled 2022-06-13: qty 2

## 2022-06-13 MED ORDER — LIDOCAINE 2% (20 MG/ML) 5 ML SYRINGE
INTRAMUSCULAR | Status: AC
  Filled 2022-06-13: qty 5

## 2022-06-13 MED ORDER — DEXAMETHASONE SODIUM PHOSPHATE 10 MG/ML IJ SOLN
INTRAMUSCULAR | Status: DC | PRN
  Administered 2022-06-13: 5 mg via INTRAVENOUS

## 2022-06-13 MED ORDER — ROCURONIUM BROMIDE 10 MG/ML (PF) SYRINGE
PREFILLED_SYRINGE | INTRAVENOUS | Status: AC
  Filled 2022-06-13: qty 10

## 2022-06-13 MED ORDER — HEPARIN 6000 UNIT IRRIGATION SOLUTION
Status: DC | PRN
  Administered 2022-06-13: 1

## 2022-06-13 MED ORDER — LABETALOL HCL 5 MG/ML IV SOLN
INTRAVENOUS | Status: AC
  Filled 2022-06-13: qty 4

## 2022-06-13 MED ORDER — PHENOL 1.4 % MT LIQD: 1.0000 | OROMUCOSAL | Status: DC | PRN

## 2022-06-13 MED ORDER — ACETAMINOPHEN 10 MG/ML IV SOLN: 1000.0000 mg | Freq: Once | INTRAVENOUS | Status: DC | PRN

## 2022-06-13 MED ORDER — GUAIFENESIN-DM 100-10 MG/5ML PO SYRP: 15.0000 mL | ORAL_SOLUTION | ORAL | Status: DC | PRN

## 2022-06-13 MED ORDER — CEFAZOLIN SODIUM-DEXTROSE 2-4 GM/100ML-% IV SOLN
2.0000 g | Freq: Three times a day (TID) | INTRAVENOUS | Status: AC
  Administered 2022-06-13 (×2): 2 g via INTRAVENOUS
  Filled 2022-06-13 (×2): qty 100

## 2022-06-13 MED ORDER — CHLORHEXIDINE GLUCONATE 0.12 % MT SOLN: 15.0000 mL | Freq: Once | OROMUCOSAL | Status: DC

## 2022-06-13 MED ORDER — LISINOPRIL 20 MG PO TABS
40.0000 mg | ORAL_TABLET | Freq: Every day | ORAL | Status: DC
  Administered 2022-06-13 – 2022-06-14 (×2): 40 mg via ORAL
  Filled 2022-06-13 (×2): qty 2

## 2022-06-13 SURGICAL SUPPLY — 47 items
ADH SKN CLS APL DERMABOND .7 (GAUZE/BANDAGES/DRESSINGS) ×1
BAG COUNTER SPONGE SURGICOUNT (BAG) ×1 IMPLANT
BAG SPNG CNTER NS LX DISP (BAG) ×1
BLADE CLIPPER SURG (BLADE) ×1 IMPLANT
CANISTER SUCT 3000ML PPV (MISCELLANEOUS) ×1 IMPLANT
CATH BEACON 5.038 65CM KMP-01 (CATHETERS) ×1 IMPLANT
CATH OMNI FLUSH .035X70CM (CATHETERS) ×1 IMPLANT
DERMABOND ADVANCED .7 DNX12 (GAUZE/BANDAGES/DRESSINGS) ×1 IMPLANT
DEVICE CLOSURE PERCLS PRGLD 6F (VASCULAR PRODUCTS) IMPLANT
DEVICE TORQUE KENDALL .025-038 (MISCELLANEOUS) IMPLANT
DRSG TEGADERM 2-3/8X2-3/4 SM (GAUZE/BANDAGES/DRESSINGS) ×2 IMPLANT
ELECT REM PT RETURN 9FT ADLT (ELECTROSURGICAL) ×2
ELECTRODE REM PT RTRN 9FT ADLT (ELECTROSURGICAL) ×2 IMPLANT
EXCLDR TRNK 28.5X14.5X12 16F (Endovascular Graft) ×1 IMPLANT
EXCLUDER TNK 28.5X14.5X12 16F (Endovascular Graft) IMPLANT
GAUZE SPONGE 2X2 8PLY STRL LF (GAUZE/BANDAGES/DRESSINGS) ×2 IMPLANT
GLIDEWIRE ADV .035X260CM (WIRE) IMPLANT
GLOVE BIO SURGEON STRL SZ7.5 (GLOVE) ×1 IMPLANT
GLOVE BIOGEL PI IND STRL 8 (GLOVE) ×1 IMPLANT
GLOVE SURG POLY ORTHO LF SZ7.5 (GLOVE) IMPLANT
GLOVE SURG UNDER LTX SZ8 (GLOVE) ×1 IMPLANT
GOWN STRL REUS W/ TWL LRG LVL3 (GOWN DISPOSABLE) ×3 IMPLANT
GOWN STRL REUS W/TWL LRG LVL3 (GOWN DISPOSABLE) ×3
GRAFT BALLN CATH 65CM (BALLOONS) ×1 IMPLANT
GUIDEWIRE ANGLED .035X150CM (WIRE) IMPLANT
KIT BASIN OR (CUSTOM PROCEDURE TRAY) ×1 IMPLANT
KIT TURNOVER KIT B (KITS) ×1 IMPLANT
LEG CONTRALATERAL 16X12X12 (Vascular Products) IMPLANT
LEG CONTRALATERAL 16X14.5X10 (Vascular Products) IMPLANT
NS IRRIG 1000ML POUR BTL (IV SOLUTION) ×1 IMPLANT
PACK ENDOVASCULAR (PACKS) ×1 IMPLANT
PAD ARMBOARD 7.5X6 YLW CONV (MISCELLANEOUS) ×2 IMPLANT
PERCLOSE PROGLIDE 6F (VASCULAR PRODUCTS) ×5
SET MICROPUNCTURE 5F STIFF (MISCELLANEOUS) ×1 IMPLANT
SHEATH BRITE TIP 8FR 23CM (SHEATH) ×1 IMPLANT
SHEATH DRYSEAL FLEX 12FR 33CM (SHEATH) IMPLANT
SHEATH DRYSEAL FLEX 18FR 33CM (SHEATH) IMPLANT
SHEATH PINNACLE 8F 10CM (SHEATH) ×1 IMPLANT
STOPCOCK MORSE 400PSI 3WAY (MISCELLANEOUS) ×1 IMPLANT
SUT MNCRL AB 4-0 PS2 18 (SUTURE) ×2 IMPLANT
SUT PROLENE 5 0 C 1 24 (SUTURE) IMPLANT
SYR 20ML LL LF (SYRINGE) ×1 IMPLANT
TOWEL GREEN STERILE (TOWEL DISPOSABLE) ×1 IMPLANT
TRAY FOLEY MTR SLVR 16FR STAT (SET/KITS/TRAYS/PACK) ×1 IMPLANT
TUBING HIGH PRESSURE 120CM (CONNECTOR) ×1 IMPLANT
WIRE AMPLATZ SS-J .035X180CM (WIRE) ×2 IMPLANT
WIRE BENTSON .035X145CM (WIRE) ×2 IMPLANT

## 2022-06-13 NOTE — Progress Notes (Addendum)
  Progress Note    06/13/2022 1:08 PM Day of Surgery  Subjective:  says some back pain but feels this is secondary to laying on his back which he says he does not usually do. Otherwise denies any abdominal pain. No lower extremity pain, weakness or numbness   Vitals:   06/13/22 1230 06/13/22 1300  BP:    Pulse:    Resp:    Temp: 98 F (36.7 C) 98.4 F (36.9 C)  SpO2:     Physical Exam: Cardiac:  regular Lungs:  non labored Incisions:  B groin incisions are intact, clean and dry. No swelling or hematoma Extremities:  well perfused and warm, motor and sensation intact Abdomen:  distended, soft, non tender Neurologic: alert and oriented  CBC    Component Value Date/Time   WBC 7.0 06/05/2022 1350   RBC 3.85 (L) 06/05/2022 1350   HGB 11.9 (L) 06/05/2022 1350   HGB 11.6 (L) 06/01/2020 1133   HCT 36.4 (L) 06/05/2022 1350   HCT 34.5 (L) 06/01/2020 1133   PLT 125 (L) 06/05/2022 1350   MCV 94.5 06/05/2022 1350   MCV 94 06/01/2020 1133   MCH 30.9 06/05/2022 1350   MCHC 32.7 06/05/2022 1350   RDW 13.3 06/05/2022 1350   RDW 13.2 06/01/2020 1133   LYMPHSABS 1,327 12/08/2021 0827   LYMPHSABS 1.5 06/01/2020 1133   MONOABS 432 08/28/2016 0845   EOSABS 149 12/08/2021 0827   EOSABS 0.1 06/01/2020 1133   BASOSABS 19 12/08/2021 0827   BASOSABS 0.0 06/01/2020 1133    BMET    Component Value Date/Time   NA 137 06/05/2022 1350   K 4.1 06/05/2022 1350   CL 99 06/05/2022 1350   CO2 24 06/05/2022 1350   GLUCOSE 248 (H) 06/05/2022 1350   BUN 17 06/05/2022 1350   CREATININE 1.62 (H) 06/05/2022 1350   CREATININE 1.52 (H) 12/08/2021 0827   CALCIUM 9.0 06/05/2022 1350   GFRNONAA 42 (L) 06/05/2022 1350   GFRNONAA 41 (L) 05/14/2020 0825   GFRAA 48 (L) 05/14/2020 0825    INR    Component Value Date/Time   INR 1.1 06/05/2022 1350     Intake/Output Summary (Last 24 hours) at 06/13/2022 1308 Last data filed at 06/13/2022 1235 Gross per 24 hour  Intake 1500 ml  Output 650 ml   Net 850 ml     Assessment/Plan:  82 y.o. male is s/p EVAR Day of Surgery   Doing well post op Minimal post operative discomfort in low back Bilateral femoral access sites c/d/I without swelling or hematoma  BLE well perfused and warm  VSS Will be able to advance diet later this afternoon Anticipate d/c tomorrow if he continues to progress well overnight   Karoline Caldwell, PA-C [facility] [phone] 06/13/2022 1:08 PM  I have interviewed the patient and examined the patient. I agree with the findings by the PA. Biphasic DP and PT bilat.   Gae Gallop, MD 4:19 PM

## 2022-06-13 NOTE — Transfer of Care (Signed)
Immediate Anesthesia Transfer of Care Note  Patient: Juan [name]  Procedure(s) Performed: ABDOMINAL AORTIC ENDOVASCULAR STENT GRAFT (Bilateral: Groin) ULTRASOUND GUIDANCE FOR VASCULAR ACCESS (Bilateral: Groin)  Patient Location: PACU  Anesthesia Type:General  Level of Consciousness: awake, drowsy, and patient cooperative  Airway & Oxygen Therapy: Patient Spontanous Breathing and Patient connected to nasal cannula oxygen  Post-op Assessment: Report given to RN and Post -op Vital signs reviewed and stable  Post vital signs: Reviewed and stable  Last Vitals:  Vitals Value Taken Time  BP 151/66 06/13/22 1002  Temp    Pulse 75 06/13/22 1006  Resp 23 06/13/22 1006  SpO2 93 % 06/13/22 1006  Vitals shown include unvalidated device data.  Last Pain:  Vitals:   06/13/22 0615  TempSrc:   PainSc: 0-No pain         Complications: No notable events documented.

## 2022-06-13 NOTE — Anesthesia Postprocedure Evaluation (Signed)
Anesthesia Post Note  Patient: Juan [name]  Procedure(s) Performed: ABDOMINAL AORTIC ENDOVASCULAR STENT GRAFT (Bilateral: Groin) ULTRASOUND GUIDANCE FOR VASCULAR ACCESS (Bilateral: Groin)     Patient location during evaluation: PACU Anesthesia Type: General Level of consciousness: awake and alert Pain management: pain level controlled Vital Signs Assessment: post-procedure vital signs reviewed and stable Respiratory status: spontaneous breathing, nonlabored ventilation, respiratory function stable and patient connected to nasal cannula oxygen Cardiovascular status: blood pressure returned to baseline and stable Postop Assessment: no apparent nausea or vomiting Anesthetic complications: no   No notable events documented.  Last Vitals:  Vitals:   06/13/22 1433 06/13/22 1452  BP: (!) 168/69 (!) 162/70  Pulse: 73 72  Resp: 20 18  Temp:    SpO2: 95% 96%    Last Pain:  Vitals:   06/13/22 1422  TempSrc:   PainSc: 0-No pain                 Belenda Cruise P Jackee Glasner

## 2022-06-13 NOTE — H&P (Signed)
REASON FOR ADMISSION:    To discuss endovascular aneurysm repair.   MEDICAL ISSUES:    5.7 CM INFRARENAL ABDOMINAL AORTIC ANEURYSM: This patient has a 5.7 centimeter infrarenal abdominal aortic aneurysm.  His risk of rupture is about 7 to 10 %/year.  I have recommended elective repair.  He does have some reverse taper on the neck but I think he would be a candidate for endovascular repair.  He is undergone previous PTCA in the past and does admit to some dyspnea on exertion.  We will obtain preoperative cardiac clearance and pending these results schedule him for endovascular aneurysm repair.   I have discussed the indications for aneurysm repair. We have discussed the advantages and disadvantages of open versus endovascular repair. The patient wishes to proceed with endovascular aneurysm repair (EVAR). I have discussed the potential complications of EVAR, including, but not limited to: bleeding, infection, arterial injury, graft migration, endoleak, renal failure, MI or other unpredictable medical problems. We have discussed the possibility of having to convert to open repair. We also discussed the need for continued lifelong follow-up after EVAR. All of the patients questions were answered and they are agreeable to proceed with surgery.    He does have some mild renal insufficiency.  His last creatinine in February was 1.6.  Will try to limit contrast as best possible.     HPI:    Juan [name] is a pleasant 82 y.o. male who I saw on 04/13/2022 for follow-up of his infrarenal abdominal aortic aneurysm.  This enlarged to 5.5 cm.  Given the enlargement I felt that his risk of rupture was approximately 7 %/year.  He had a CT angiogram which shows that he is a candidate for an endovascular repair.   Since I saw him last, he denies any abdominal pain or back pain.   He has undergone previous PTCA in the remote past.  He has not seen cardiology recently.  He does admit to some dyspnea on  exertion.  He denies any chest pain.       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      50-69% (12/2015)   Neuromuscular disorder (HCC)      L2-3,L5-S1 bulging disc /nerve impingement   PAD (peripheral artery disease) ([place])             Family History  Problem Relation Age of Onset   Diabetes Mother     Heart disease Mother          After age 7   Heart attack Mother     Hypertension Mother     Diabetes Father     Heart disease Father          After age 63   Hypertension Father     Heart attack Father     Brain cancer Sister     Cancer Sister          Brain   Diabetes Sister     Hypertension Sister     Cancer Brother  Chest Tumor   Diabetes Brother     Hypertension Brother     Deep vein thrombosis Sister     Diabetes Sister     Diabetes Brother          Bilateral leg   Colon cancer Neg Hx        SOCIAL HISTORY: Social History         Tobacco Use   Smoking status: Former      Years: 35.00      Types: Cigarettes      Quit date: 01/02/1988      Years since quitting: 34.3   Smokeless tobacco: Never  Substance Use Topics   Alcohol use: Not Currently      Comment: rare beer, no history of ETOH abuse           Allergies  Allergen Reactions   Jardiance [Empagliflozin]        Severe nausea-             Current Outpatient Medications  Medication Sig Dispense Refill   allopurinol (ZYLOPRIM) 300 MG tablet TAKE 1 TABLET EVERY DAY 90 tablet 10   aspirin EC 81 MG tablet Take 81 mg by mouth daily.       Blood Glucose Monitoring Suppl (TRUE METRIX METER) w/Device KIT Use as Directed 1  kit 1   diltiazem (CARDIZEM CD) 240 MG 24 hr capsule TAKE 1 CAPSULE EVERY DAY 90 capsule 0   doxazosin (CARDURA) 8 MG tablet TAKE 1 TABLET EVERY DAY 90 tablet 3   Dulaglutide (TRULICITY) 1.5 0000000 SOPN INJECT 1.5 MG (0.5ML) UNDER THE SKIN ONCE A WEEK 4 mL 3   glipiZIDE (GLUCOTROL) 5 MG tablet TAKE 1 TABLET TWICE DAILY BEFORE MEALS 180 tablet 0   glucose blood (TRUE METRIX BLOOD GLUCOSE TEST) test strip 1 each by Other route daily. DX: E11.9 100 strip 5   Lancet Devices (PRODIGY LANCING DEVICE) MISC Use bid DX e11.9 1 each 2   lisinopril (ZESTRIL) 40 MG tablet TAKE 1 TABLET EVERY DAY 90 tablet 10   metoprolol succinate (TOPROL-XL) 50 MG 24 hr tablet TAKE 1 TABLET EVERY DAY. TAKE WITH OR IMMEDIATELY FOLLOWING A MEAL 90 tablet 0   Multiple Vitamin (MULTIVITAMIN WITH MINERALS) TABS Take 1 tablet by mouth daily.       Omega-3 Fatty Acids (FISH OIL PO) Take by mouth.       pantoprazole (PROTONIX) 20 MG tablet TAKE 1 TABLET EVERY DAY 90 tablet 10   potassium chloride SA (KLOR-CON) 20 MEQ tablet Take 1 tablet (20 mEq total) by mouth daily. 90 tablet 3   rOPINIRole (REQUIP) 0.5 MG tablet Take 1 tablet (0.5 mg total) by mouth at bedtime. 30 tablet 3   rosuvastatin (CRESTOR) 20 MG tablet TAKE 1 TABLET EVERY DAY 90 tablet 3   SPIKEVAX syringe         TRUEplus Lancets 33G MISC TEST BLOOD SUGAR EVERY DAY 100 each 3    No current facility-administered medications for this visit.      REVIEW OF SYSTEMS:  [X]  denotes positive finding, [ ]  denotes negative finding Cardiac   Comments:  Chest pain or chest pressure:      Shortness of breath upon exertion: x    Short of breath when lying flat:      Irregular heart rhythm:             Vascular      Pain in calf, thigh, or hip brought on by ambulation:  Pain in feet at night that wakes you up from your sleep:       Blood clot in your veins:      Leg swelling:              Pulmonary      Oxygen at home:      Productive cough:       Wheezing:               Neurologic      Sudden weakness in arms or legs:       Sudden numbness in arms or legs:       Sudden onset of difficulty speaking or slurred speech:      Temporary loss of vision in one eye:       Problems with dizziness:              Gastrointestinal      Blood in stool:       Vomited blood:              Genitourinary      Burning when urinating:       Blood in urine:             Psychiatric      Major depression:              Hematologic      Bleeding problems:      Problems with blood clotting too easily:             Skin      Rashes or ulcers:             Constitutional      Fever or chills:        PHYSICAL EXAM:    Vitals:   06/13/22 0548  BP: (!) 169/73  Pulse: 68  Resp: 17  Temp: 98 F (36.7 C)  SpO2: 99%   Today's Vitals   06/13/22 0548 06/13/22 0615  BP: (!) 169/73   Pulse: 68   Resp: 17   Temp: 98 F (36.7 C)   TempSrc: Oral   SpO2: 99%   Weight: 112.5 kg   Height: 5\' 11"  (1.803 m)   PainSc:  0-No pain      Vitals:    05/11/22 1349  BP: (!) 163/76  Pulse: 92  Resp: 20  Temp: 98.1 F (36.7 C)  SpO2: 95%  Weight: 247 lb (112 kg)  Height: 5\' 10"  (1.778 m)    Body mass index is 35.44 kg/m.   GENERAL: The patient is a well-nourished male, in no acute distress. The vital signs are documented above. CARDIAC: There is a regular rate and rhythm.  VASCULAR: I do not detect carotid bruits. On the right side he has a palpable femoral pulse and palpable pedal pulses. On the left side he has a palpable femoral pulse and popliteal pulse.  He has a biphasic anterior tibial and posterior tibial signal with the Doppler.  He has a monophasic dorsalis pedis signal on the left. PULMONARY: There is good air exchange bilaterally without wheezing or rales. ABDOMEN: Soft and non-tender with normal pitched bowel sounds.  MUSCULOSKELETAL: There are no major deformities or cyanosis. NEUROLOGIC: No focal weakness or paresthesias are detected. SKIN:  There are no ulcers or rashes noted. PSYCHIATRIC: The patient has a normal affect.   DATA:     CT ANGIO ABDOMEN PELVIS: I reviewed the images of his CT angio of the abdomen pelvis.  The maximum diameter of the aneurysm is 5.7 cm.  Based on the study he does appear to be a candidate for endovascular aneurysm repair.  He does have some reversed taper of the neck but I have reviewed the films with Gore and it looks like we can address this with the conformable device.    Based on his CT scan we would likely do an ipsilateral right with a 28 X 14 X 12 cm  (14 X 10 cm) 18 Fr. On the left, 12 X 12 cm 12 Fr.    Deitra Mayo [facility] [phone]

## 2022-06-13 NOTE — Progress Notes (Signed)
Patient arrived at the unit,CHG bath given,vitals checked,CCMD notified,bilateral groin site level 0,bilateral dorsalis pedis pulse doppler,pt oriented to the unit

## 2022-06-13 NOTE — Anesthesia Procedure Notes (Signed)
Arterial Line Insertion Start/End4/04/2022 7:10 AM, 06/13/2022 7:18 AM Performed by: Darral Dash, DO, Janene Harvey, CRNA, CRNA  Preanesthetic checklist: patient identified, IV checked, site marked, risks and benefits discussed, surgical consent, monitors and equipment checked, pre-op evaluation, timeout performed and anesthesia consent Lidocaine 1% used for infiltration Right, radial was placed Catheter size: 20 G Hand hygiene performed  and Seldinger technique used Allen's test indicative of satisfactory collateral circulation Attempts: 1 Procedure performed without using ultrasound guided technique. Following insertion, Biopatch and dressing applied. Post procedure assessment: normal  Patient tolerated the procedure well with no immediate complications.

## 2022-06-13 NOTE — Plan of Care (Signed)
  Problem: Education: Goal: Knowledge of discharge needs will improve Outcome: Progressing   Problem: Clinical Measurements: Goal: Postoperative complications will be avoided or minimized Outcome: Progressing   Problem: Respiratory: Goal: Ability to achieve and maintain a regular respiratory rate will improve Outcome: Progressing   Problem: Skin Integrity: Goal: Demonstration of wound healing without infection will improve Outcome: Progressing   

## 2022-06-13 NOTE — Op Note (Signed)
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 [phone]) Placement of aortobiiliac endograft [id number]) Placement of extension, endovascular [id number])  SURGEON: Judeth Cornfield. Scot Dock, MD  ASSIST: Dr. Curt Jews  ANESTHESIA: General  EBL: 100 cc  INDICATIONS:    Juan [name] is a 82 y.o. male who I have been following with an infrarenal abdominal aortic aneurysm.  This did enlarge significantly to 5.7 cm.  Given the risk of rupture elective repair was recommended.  He was felt to be a candidate for endovascular repair  FINDINGS:   At the completion of the procedure there were good Doppler signals in both feet.  Completion arteriogram did not show any evidence of a type I or type II endoleak.  The renal arteries were patent and both hypogastric arteries were patent.  TECHNIQUE:   Patient was brought to the peripheral vascular lab and received a general anesthetic.  Arterial line have been placed by anesthesia.  The abdomen and both groins were prepped and draped in the usual sterile fashion.  Attention was first turned to the right groin.  Under ultrasound guidance, the right common femoral artery was cannulated with a micropuncture needle and a micropuncture sheath was introduced over a wire.  There was significant tortuosity in the iliac system and ultimately I needed a Kumpe catheter and Glidewire advantage to navigate into the infrarenal aorta.  Over this an 64 [place] dilator was used to dilate and then 2 Perclose devices were placed.  The initial Perclose device was rotated 15 degrees medially.  The second Perclose device was rotated 15 degrees laterally.  A short 8 French sheath was placed on the right side.  I then advanced a catheter over the wire and  exchanged for an Amplatz wire and marked the position of the wire on the table to be sure that this did not advanced beyond the arch.  Attention was then turned to the left side.  Again under ultrasound guidance the left common femoral artery was cannulated with a micropuncture needle and micropuncture sheath introduced over a wire.  The Glidewire was manage was admitted advanced into the infrarenal aorta.  The tract was dilated with an 8 [place] dilator.  The initial Perclose device was then placed and rotated 15 degrees medially.  The second device was rotated 15 degrees laterally.  The wire was then advanced and exchanged for a Amplatz wire.  The 12 French sheath was placed up the left side.  Next the pigtail catheter was positioned at the left.  On the right side I exchanged the 8 French sheath for the 18 French sheath over the wire without difficulty.  Next arteriogram was obtained to visualize the neck.  The renal arteries were clearly identified.  A 28 mm x 14 mm x 12 cm bifurcated graft component was placed with the gate positioned crossed.  This was deployed just below the renals.  I then cannulated the contralateral gate using a Kumpe catheter and Bentson wire.  I then advanced the pigtail catheter into the graft and turned this to be sure that we were within the graft.  I then advanced the proximal graft slightly after conforming the proximal neck and advancing it and then opening and again.  This was positioned right  below the right renal artery which is the lowest renal artery.  A stiff wire was then placed up the left side.  We then obtained a retrograde iliac shot on the left using an RAO projection to demonstrate the position of the hypogastric artery.  A 12 mm x 12 cm device was selected and this limb was placed and deployed without difficulty.  This preserve the left hypogastric artery.  The graft was then deployed and then the extension limb on the right was a 14 mm x 10 cm device which was  deployed without difficulty.  The proximal graft, overlap zones, and distal graft were then ballooned with the mob balloon.  The pigtail was then placed up the left side and completion film showed an excellent result.  The graft was in good position.  There is no evidence of type I or type II endoleak.  There were good Doppler signals in both feet.  The Perclose devices were then closed on the left after the sheath was removed and then ultimately the wire removed.  There was good hemostasis.  Likewise on the right side the Perclose devices were removed after the sheath was removed and the wire removed and again there was good hemostasis.  The small incisions were closed with 4-0 Monocryl's.  Given the complexity of the case a first assistant was necessary in order to expedient the procedure and safely perform the technical aspects of the operation.  This was especially necessary for sheath exchanges to be done safely.  The patient tolerated the procedure well was transferred to recovery room in stable condition.  All needle and sponge counts were correct.  Deitra Mayo, MD, FACS [facility]  DATE OF DICTATION:   06/13/2022

## 2022-06-13 NOTE — Anesthesia Procedure Notes (Signed)
Procedure Name: Intubation Date/Time: 06/13/2022 7:52 AM  Performed by: Mosetta Pigeon, CRNAPre-anesthesia Checklist: Patient identified, Emergency Drugs available, Suction available and Patient being monitored Patient Re-evaluated:Patient Re-evaluated prior to induction Oxygen Delivery Method: Circle System Utilized Preoxygenation: Pre-oxygenation with 100% oxygen Induction Type: IV induction Ventilation: Mask ventilation without difficulty Laryngoscope Size: Mac and 4 Grade View: Grade I Tube type: Oral Tube size: 7.5 mm Number of attempts: 1 Airway Equipment and Method: Stylet and Oral airway Placement Confirmation: ETT inserted through vocal cords under direct vision, positive ETCO2 and breath sounds checked- equal and bilateral Secured at: 22 cm Tube secured with: Tape Dental Injury: Teeth and Oropharynx as per pre-operative assessment

## 2022-06-14 LAB — BASIC METABOLIC PANEL
Anion gap: 13 (ref 5–15)
BUN: 24 mg/dL — ABNORMAL HIGH (ref 8–23)
CO2: 21 mmol/L — ABNORMAL LOW (ref 22–32)
Calcium: 8.7 mg/dL — ABNORMAL LOW (ref 8.9–10.3)
Chloride: 101 mmol/L (ref 98–111)
Creatinine, Ser: 1.63 mg/dL — ABNORMAL HIGH (ref 0.61–1.24)
GFR, Estimated: 41 mL/min — ABNORMAL LOW (ref >60)
Glucose, Bld: 243 mg/dL — ABNORMAL HIGH (ref 70–99)
Potassium: 3.8 mmol/L (ref 3.5–5.1)
Sodium: 135 mmol/L (ref 135–145)

## 2022-06-14 LAB — CBC
HCT: 30.8 % — ABNORMAL LOW (ref 39.0–52.0)
Hemoglobin: 10.2 g/dL — ABNORMAL LOW (ref 13.0–17.0)
MCH: 31.3 pg (ref 26.0–34.0)
MCHC: 33.1 g/dL (ref 30.0–36.0)
MCV: 94.5 fL (ref 80.0–100.0)
Platelets: 113 10*3/uL — ABNORMAL LOW (ref 150–400)
RBC: 3.26 MIL/uL — ABNORMAL LOW (ref 4.22–5.81)
RDW: 13.2 % (ref 11.5–15.5)
WBC: 8.6 10*3/uL (ref 4.0–10.5)
nRBC: 0 % (ref 0.0–0.2)

## 2022-06-14 MED ORDER — TRAMADOL HCL 50 MG PO TABS: 50.0000 mg | ORAL_TABLET | Freq: Four times a day (QID) | ORAL | 0 refills | Status: DC | PRN

## 2022-06-14 NOTE — Progress Notes (Signed)
Discharge instructions given. Patient verbalized understanding and all questions were answered.  ?

## 2022-06-14 NOTE — Progress Notes (Addendum)
  Progress Note    06/14/2022 7:24 AM 1 Day Post-Op  Subjective:  no complaints. Says he feels good. Ready to go home. Denies any back or abdominal pain   Vitals:   06/14/22 0600 06/14/22 0630  BP: (!) 126/49 (!) 149/54  Pulse: (!) 51 (!) 57  Resp: 15 16  Temp:    SpO2: 95% 97%   Physical Exam: Cardiac:  regular Lungs:  non labored Incisions:  B groin access sites clean, dry and intact. Soft without swelling or hematoma Extremities:  BLE well perfused and warm with Doppler DP signals bilaterally Abdomen:  distended, soft, non tender Neurologic: alert and oriented  CBC    Component Value Date/Time   WBC 8.6 06/14/2022 0136   RBC 3.26 (L) 06/14/2022 0136   HGB 10.2 (L) 06/14/2022 0136   HGB 11.6 (L) 06/01/2020 1133   HCT 30.8 (L) 06/14/2022 0136   HCT 34.5 (L) 06/01/2020 1133   PLT 113 (L) 06/14/2022 0136   MCV 94.5 06/14/2022 0136   MCV 94 06/01/2020 1133   MCH 31.3 06/14/2022 0136   MCHC 33.1 06/14/2022 0136   RDW 13.2 06/14/2022 0136   RDW 13.2 06/01/2020 1133   LYMPHSABS 1,327 12/08/2021 0827   LYMPHSABS 1.5 06/01/2020 1133   MONOABS 432 08/28/2016 0845   EOSABS 149 12/08/2021 0827   EOSABS 0.1 06/01/2020 1133   BASOSABS 19 12/08/2021 0827   BASOSABS 0.0 06/01/2020 1133    BMET    Component Value Date/Time   NA 135 06/14/2022 0136   K 3.8 06/14/2022 0136   CL 101 06/14/2022 0136   CO2 21 (L) 06/14/2022 0136   GLUCOSE 243 (H) 06/14/2022 0136   BUN 24 (H) 06/14/2022 0136   CREATININE 1.63 (H) 06/14/2022 0136   CREATININE 1.52 (H) 12/08/2021 0827   CALCIUM 8.7 (L) 06/14/2022 0136   GFRNONAA 41 (L) 06/14/2022 0136   GFRNONAA 41 (L) 05/14/2020 0825   GFRAA 48 (L) 05/14/2020 0825    INR    Component Value Date/Time   INR 1.1 06/05/2022 1350     Intake/Output Summary (Last 24 hours) at 06/14/2022 0724 Last data filed at 06/14/2022 0600 Gross per 24 hour  Intake 1840 ml  Output 1800 ml  Net 40 ml     Assessment/Plan:  82 y.o. male is s/p EVAR 1  Day Post-Op   B groin access sites clean, dry and intact without swelling or hematoma BLE well perfused and warm H&H stable VSS Stable for discharge home today Continue Aspirin and statin He will follow up in 1 month with CTA Abdomen and Pelvis   Karoline Caldwell, PA-C [facility] [phone] 06/14/2022 7:24 AM  I have interviewed the patient and examined the patient. I agree with the findings by the PA. Agree with plans for D/C today.   Gae Gallop, MD

## 2022-06-14 NOTE — Discharge Instructions (Signed)
  [facility]   Discharge Instructions  Endovascular Aortic Aneurysm Repair  Please refer to the following instructions for your post-procedure care. Your surgeon or Physician Assistant will discuss any changes with you.  Activity  You are encouraged to walk as much as you can. You can slowly return to normal activities but must avoid strenuous activity and heavy lifting until your doctor tells you it's OK. Avoid activities such as vacuuming or swinging a gold club. It is normal to feel tired for several weeks after your surgery. Do not drive until your doctor gives the OK and you are no longer taking prescription pain medications. It is also normal to have difficulty with sleep habits, eating, and bowel movements after surgery. These will go away with time.  Bathing/Showering  Shower daily after you go home.  Do not soak in a bathtub, hot tub, or swim until the incision heals completely.  If you have incisions in your groin, wash the groin wounds with soap and water daily and pat dry. (No tub bath-only shower)  Then put a dry gauze or washcloth there to keep this area dry to help prevent wound infection daily and as needed.  Do not use Vaseline or neosporin on your incisions.  Only use soap and water on your incisions and then protect and keep dry.  Incision Care  Shower every day. Clean your incision with mild soap and water. Pat the area dry with a clean towel. You do not need a bandage unless otherwise instructed. Do not apply any ointments or creams to your incision. If you clothing is irritating, you may cover your incision with a dry gauze pad.  Diet  Resume your normal diet. There are no special food restrictions following this procedure. A low fat/low cholesterol diet is recommended for all patients with vascular disease. In order to heal from your surgery, it is CRITICAL to get adequate nutrition. Your body requires vitamins, minerals, and protein.  Vegetables are the best source of vitamins and minerals. Vegetables also provide the perfect balance of protein. Processed food has little nutritional value, so try to avoid this.  Medications  Resume taking all of your medications unless your doctor or nurse practitioner tells you not to. If your incision is causing pain, you may take over-the-counter pain relievers such as acetaminophen (Tylenol). If you were prescribed a stronger pain medication, please be aware these medications can cause nausea and constipation. Prevent nausea by taking the medication with a snack or meal. Avoid constipation by drinking plenty of fluids and eating foods with a high amount of fiber, such as fruits, vegetables, and grains.  Do not take Tylenol if you are taking prescription pain medications.   Follow up  Our office will schedule a follow-up appointment with a CT scan 3-4 weeks after your surgery.  Please call us immediately for any of the following conditions  Severe or worsening pain in your legs or feet or in your abdomen back or chest. Increased pain, redness, drainage (pus) from your incision site. Increased abdominal pain, bloating, nausea, vomiting or persistent diarrhea. Fever of 101 degrees or higher. Swelling in your leg (s),  Reduce your risk of vascular disease  Stop smoking. If you would like help call [facility] at 1-800-QUIT-NOW (1-800-784-8669) or [place] at 336-586-4000. Manage your cholesterol Maintain a desired weight Control your diabetes Keep your blood pressure down  If you have questions, please call the office at [phone].  

## 2022-06-14 NOTE — TOC Transition Note (Signed)
Transition of Care (TOC) - CM/SW Discharge Note Marvetta Gibbons RN, BSN Transitions of Care Unit 4E- RN Case Manager See Treatment Team for direct phone #   Patient Details  Name: Juan [name] MRN: [id number] Date of Birth: 02-19-1941  Transition of Care [facility]) CM/SW Contact:  Dawayne Patricia, RN Phone Number: 06/14/2022, 12:12 PM   Clinical Narrative:    Pt stable for transition home today, spouse to transport home. TOC notified by Latricia Heft that they received office protocol referral from [facility]. [facility] to follow up post discharge for [facility] needs and will contact pt to schedule.   No further TOC needs. Noted.    Final next level of care: [place] Barriers to Discharge: No Barriers Identified   Patient Goals and CMS Choice    Office protocol referral  Discharge Placement               Home w/ [facility]          Discharge Plan and Services Additional resources added to the After Visit Summary for     Discharge Planning Services: CM Consult            DME Arranged: N/A DME Agency: NA         HH Agency: [place] Date [place]: 06/14/22 Time [place]: 1212 Representative spoke with at [place]: Camilla Determinants of Health ([place]) Interventions [place]: Low Risk  (09/17/2020)  Transportation Needs: No Transportation Needs (09/17/2020)  Alcohol Screen: Low Risk  (12/13/2020)  Depression (PHQ2-9): Low Risk  (12/15/2021)  Tobacco Use: Medium Risk (06/13/2022)     Readmission Risk Interventions     No data to display

## 2022-06-15 ENCOUNTER — Encounter: Payer: Self-pay | Admitting: *Deleted

## 2022-06-15 ENCOUNTER — Telehealth: Payer: Self-pay | Admitting: *Deleted

## 2022-06-15 ENCOUNTER — Ambulatory Visit: Payer: Self-pay

## 2022-06-15 NOTE — Discharge Summary (Signed)
EVAR Discharge Summary   Juan [name] [birth year] 82 y.o. male  MRN: [id number]  Admission Date: 06/13/2022  Discharge Date: 06/14/2022  Physician: Deitra Mayo  Admission Diagnosis: AAA (abdominal aortic aneurysm) [I71.40]  Hospital Course:  The patient was admitted to the hospital and taken to the operating room on 06/13/2022 and underwent: percutaneous access and closure of bilateral common femoral arteries, placement of Aortobiiliac endograft and placement of endovascular extension by Dr. Scot Dock and Dr. Donnetta Hutching.   The pt tolerated the procedure well and was transported to the PACU in good condition.   He was seen later post operatively doing very well. No abdominal pain or back pain. Bilateral femoral access sites without any swelling or hematoma. Bilateral lower extremities well perfused and warm.  The remainder of the hospital course consisted of increasing mobilization and increasing intake of solids without difficulty.  By POD#1, he did well overnight. Remained without any abdominal pain or back pain. Bilateral femoral access sites without any swelling or hematoma. Bilateral lower extremities well perfused and warm with doppler DP and PT signals. Hemodynamically stable. He remained stable for discharge home. He will continue on Aspirin and statin.PDMP was reviewed and post operative pain medication sent to patients pharmacy.  He will resume all other home medications as prescribed. He will follow up in 1 month with Dr. Scot Dock with CTA abdomen and Pelvis.  CBC    Component Value Date/Time   WBC 8.6 06/14/2022 0136   RBC 3.26 (L) 06/14/2022 0136   HGB 10.2 (L) 06/14/2022 0136   HGB 11.6 (L) 06/01/2020 1133   HCT 30.8 (L) 06/14/2022 0136   HCT 34.5 (L) 06/01/2020 1133   PLT 113 (L) 06/14/2022 0136   MCV 94.5 06/14/2022 0136   MCV 94 06/01/2020 1133   MCH 31.3 06/14/2022 0136   MCHC 33.1 06/14/2022 0136   RDW 13.2 06/14/2022 0136   RDW 13.2 06/01/2020 1133    LYMPHSABS 1,327 12/08/2021 0827   LYMPHSABS 1.5 06/01/2020 1133   MONOABS 432 08/28/2016 0845   EOSABS 149 12/08/2021 0827   EOSABS 0.1 06/01/2020 1133   BASOSABS 19 12/08/2021 0827   BASOSABS 0.0 06/01/2020 1133    BMET    Component Value Date/Time   NA 135 06/14/2022 0136   K 3.8 06/14/2022 0136   CL 101 06/14/2022 0136   CO2 21 (L) 06/14/2022 0136   GLUCOSE 243 (H) 06/14/2022 0136   BUN 24 (H) 06/14/2022 0136   CREATININE 1.63 (H) 06/14/2022 0136   CREATININE 1.52 (H) 12/08/2021 0827   CALCIUM 8.7 (L) 06/14/2022 0136   GFRNONAA 41 (L) 06/14/2022 0136   GFRNONAA 41 (L) 05/14/2020 0825   GFRAA 48 (L) 05/14/2020 0825       Discharge Instructions     ABDOMINAL PROCEDURE/ANEURYSM REPAIR/AORTO-BIFEMORAL BYPASS:  Call MD for increased abdominal pain; cramping diarrhea; nausea/vomiting   Complete by: As directed    Call MD for:  redness, tenderness, or signs of infection (pain, swelling, bleeding, redness, odor or green/yellow discharge around incision site)   Complete by: As directed    Call MD for:  severe or increased pain, loss or decreased feeling  in affected limb(s)   Complete by: As directed    Call MD for:  temperature >100.5   Complete by: As directed    Discharge wound care:   Complete by: As directed    You can shower and wash groin access sites in both groins with mild soap and water, pat dry. 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   Neuropathy associated with endocrine disorder 05/25/2020   Restless legs syndrome (RLS) 05/25/2020   Retinal telangiectasis, 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   OSA (obstructive sleep apnea) 09/18/2019   Cardiac arrhythmia 08/21/2019   Cystoid macular edema of right eye 07/14/2019   Vitreomacular adhesion of left eye 07/14/2019   Cystoid macular edema of left eye 07/14/2019   Vitreomacular adhesion of both eyes 07/14/2019   Retinopathy due to secondary diabetes mellitus, with macular edema, with retinopathy 07/03/2019   CAD S/P percutaneous coronary angioplasty 07/03/2019   CKD (chronic kidney disease) stage 3, GFR 30-59 ml/min    Hypertension 05/09/2017   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    PAD (peripheral artery disease)    Personal history of colonic polyps 09/09/2012   Diabetes mellitus without complication    Hyperlipidemia    Neuromuscular disorder    CAD (coronary artery disease)    AAA (abdominal aortic aneurysm) 01/02/2012   Past Medical History:  Diagnosis Date   AAA (abdominal aortic aneurysm) 10/2010   3.5cm   Arthritis    CAD (coronary artery disease)    CKD (chronic kidney disease) stage 3, GFR 30-59 ml/min    Colon polyps    Diabetes mellitus without complication    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   allopurinol 300  MG tablet Commonly known as: ZYLOPRIM TAKE 1 TABLET EVERY DAY   aspirin EC 81 MG tablet Take 81 mg by mouth daily.   diltiazem 240 MG 24 hr capsule Commonly known as: CARDIZEM CD TAKE 1 CAPSULE EVERY DAY   doxazosin 8 MG tablet Commonly known as: CARDURA TAKE 1 TABLET EVERY DAY   Fish Oil 1200 MG Caps Take 1,200 mg by mouth 2 (two) times daily.   glipiZIDE 5 MG tablet Commonly known as: GLUCOTROL TAKE 1 TABLET TWICE DAILY BEFORE MEALS   lisinopril 40 MG tablet Commonly known as: ZESTRIL TAKE 1 TABLET EVERY DAY   metoprolol succinate 50 MG 24 hr tablet Commonly known as: TOPROL-XL TAKE 1 TABLET EVERY DAY. TAKE WITH OR IMMEDIATELY FOLLOWING A  MEAL (NEED MD APPOINTMENT FOR REFILLS)   multivitamin with minerals Tabs tablet Take 1 tablet by mouth daily.   pantoprazole 20 MG tablet Commonly known as: PROTONIX TAKE 1 TABLET EVERY DAY   potassium chloride SA 20 MEQ tablet Commonly known as: KLOR-CON M Take 1 tablet (20 mEq total) by mouth daily.   Prodigy Lancing Device Misc Use bid DX e11.9   rOPINIRole 0.5 MG tablet Commonly known as: Requip Take 1 tablet (0.5 mg total) by mouth at bedtime.   rosuvastatin 20 MG tablet Commonly known as: CRESTOR TAKE 1 TABLET EVERY DAY   traMADol 50 MG tablet Commonly known as: Ultram Take 1 tablet (50 mg total) by mouth every 6 (six) hours as needed.   True Metrix Blood Glucose Test test strip Generic drug: glucose blood 1 each by Other route daily. DX: E11.9   True Metrix Meter w/Device Kit Use as Directed   TRUEplus Lancets 33G Misc TEST BLOOD SUGAR EVERY DAY   Trulicity 1.5 0000000 Sopn Generic drug: Dulaglutide INJECT 1.5 MG (0.5ML) UNDER THE SKIN ONCE A WEEK               Discharge Care Instructions  (From admission, onward)           Start     Ordered   06/14/22 0000  Discharge wound care:       Comments: You can shower and wash groin access sites in both groins with mild soap and water, pat dry.  Do not soak in bathtub   06/14/22 0733            Discharge Instructions:   [facility]  Discharge Instructions Endovascular Aortic Aneurysm Repair  Please refer to the following instructions for your post-procedure care. Your surgeon or Physician Assistant will discuss any changes with you.  Activity  You are encouraged to walk as much as you can. You can slowly return to normal activities but must avoid strenuous activity and heavy lifting until your doctor tells you it's OK. Avoid activities such as vacuuming or swinging a gold club. It is normal to feel tired for several weeks after your surgery. Do not drive until your doctor gives the OK and you are no longer taking prescription pain medications. It is also normal to have difficulty with sleep habits, eating, and bowel movements after surgery. These will go away with time.  Bathing/Showering  You may shower after you go home. If you have an incision, do not soak in a bathtub, hot tub, or swim until the incision heals completely.  Incision Care  Shower every day. Clean your incision with mild soap and water. Pat the area dry with a clean towel. You do not need a bandage unless otherwise instructed. Do not apply any ointments or creams to your incision. If you clothing is irritating, you may cover your incision with a dry gauze pad.  Diet  Resume your normal diet. There are no special food restrictions following this procedure. A low fat/low cholesterol diet is recommended for all patients with vascular disease. In order to heal from your surgery, it is CRITICAL to get adequate nutrition. Your body requires vitamins, minerals, and protein. Vegetables are the best source of vitamins and minerals. Vegetables also provide the perfect balance of protein. Processed food has little nutritional value, so try to avoid this.  Medications  Resume taking all of your medications unless your doctor or  Physician Assistnat tells you not to. If your  incision is causing pain, you may take over-the-counter pain relievers such as acetaminophen (Tylenol). If you were prescribed a stronger pain medication, please be aware these medications can cause nausea and constipation. Prevent nausea by taking the medication with a snack or meal. Avoid constipation by drinking plenty of fluids and eating foods with a high amount of fiber, such as fruits, vegetables, and grains. Do not take Tylenol if you are taking prescription pain medications.   Follow up  [place] office will schedule a follow-up appointment with a C.T. scan 3-4 weeks after your surgery.  Please call us immediately for any of the following conditions  Severe or worsening pain in your legs or feet or in your abdomen back or chest. Increased pain, redness, drainage (pus) from your incision sit. Increased abdominal pain, bloating, nausea, vomiting or persistent diarrhea. Fever of 101 degrees or higher. Swelling in your leg (s),  Reduce your risk of vascular disease  Stop smoking. If you would like help call [facility] at 1-800-QUIT-NOW [phone]) or [place] at [phone]. Manage your cholesterol Maintain a desired weight Control your diabetes Keep your blood pressure down  If you have questions, please call the office at [phone].    Prescriptions given: Hydrocodone- Acetaminophen #8 No Refill  Disposition: Home  Patient's condition: is Good  Follow up: 1. Dr. Scot Dock in 4 weeks with CTA protocol   Karoline Caldwell, PA-C [facility] [phone] 06/15/2022  11:09 AM   - For VQI Registry use - Post-op:  Time to Extubation: [X]  In OR, [ ]  < 12 hrs, [ ]  12-24 hrs, [ ]  >=24 hrs Vasopressors Req. Post-op: No MI: No., [ ]  Troponin only, [ ]  EKG or Clinical New Arrhythmia: No CHF: No ICU Stay: 0 day in ICU Transfusion: No     If yes, 0 units given  Complications: Resp failure: No., [ ]   Pneumonia, [ ]  Ventilator Chg in renal function: No., [ ]  Inc. Cr > 0.5, [ ]  Temp. Dialysis,  [ ]  Permanent dialysis Leg ischemia: No., no Surgery needed, [ ]  Yes, Surgery needed,  [ ]  Amputation Bowel ischemia: No., [ ]  Medical Rx, [ ]  Surgical Rx Wound complication: No., [ ]  Superficial separation/infection, [ ]  Return to OR Return to OR: No  Return to OR for bleeding: No Stroke: No., [ ]  Minor, [ ]  Major  Discharge medications: Statin use:  Yes  ASA use:  Yes  Plavix use:  No  Beta blocker use:  Yes  ARB use:  No ACEI use:  No CCB use:  No

## 2022-06-15 NOTE — Chronic Care Management (AMB) (Signed)
   06/15/2022  [name] 08-14-1940 [id number]   Reason for Encounter: Patient is not currently enrolled in the CCM program. CCM status changed to previously enrolled.   Horris Latino RN Care Manager/Chronic Care Management [phone]

## 2022-06-15 NOTE — Transitions of Care (Post Inpatient/ED Visit) (Signed)
   06/15/2022  Name: Juan [name] MRN: [id number] DOB: 12-05-1940  Today's TOC FU Call Status: Today's TOC FU Call Status:: Successful TOC FU Call Competed TOC FU Call Complete Date: 06/15/22  Transition Care Management Follow-up Telephone Call Date of Discharge: 06/14/22 Dis[facility]) Type of Discharge: Inpatient Admission Primary Inpatient Discharge Diagnosis:: AAA How have you been since you were released from the hospital?: Better Any questions or concerns?: No  Items Reviewed: Did you receive and understand the discharge instructions provided?: Yes Medications obtained and verified?: Yes (Medications Reviewed) (Tramadol) Any new allergies since your discharge?: No Dietary orders reviewed?: Yes Type of Diet Ordered:: resume regular diet Do you have support at home?: Yes People in Home: spouse Name of Support/Comfort Primary Source: [facility] and Equipment/Supplies: [place] Ordered?: No Any new equipment or medical supplies ordered?: No  Functional Questionnaire: Do you need assistance with bathing/showering or dressing?: No Do you need assistance with meal preparation?: No Do you need assistance with eating?: No Do you have difficulty maintaining continence: No Do you need assistance with getting out of bed/getting out of a chair/moving?: No Do you have difficulty managing or taking your medications?: No  Follow up appointments reviewed: PCP Follow-up appointment confirmed?: [facility] Follow-up appointment confirmed?: Yes Date of Specialist follow-up appointment?: 07/20/22 Follow-Up Specialty Provider:: Dr Scot Dock Do you need transportation to your follow-up appointment?: No Do you understand care options if your condition(s) worsen?: Yes-patient verbalized understanding  SDOH Interventions Today    Flowsheet Row Most Recent Value  SDOH Interventions   Transportation Interventions Intervention Not  Indicated  Financial Strain Interventions Intervention Not Indicated      Chong Sicilian, BSN, RN-BC RN Care Coordinator [place]: [phone] Main #: [phone]

## 2022-06-16 DIAGNOSIS — I2510 Atherosclerotic heart disease of native coronary artery without angina pectoris: Secondary | ICD-10-CM | POA: Diagnosis not present

## 2022-06-16 DIAGNOSIS — S61511D Laceration without foreign body of right wrist, subsequent encounter: Secondary | ICD-10-CM | POA: Diagnosis not present

## 2022-06-16 DIAGNOSIS — N183 Chronic kidney disease, stage 3 unspecified: Secondary | ICD-10-CM | POA: Diagnosis not present

## 2022-06-16 DIAGNOSIS — Z48812 Encounter for surgical aftercare following surgery on the circulatory system: Secondary | ICD-10-CM | POA: Diagnosis not present

## 2022-06-16 DIAGNOSIS — Z7985 Long-term (current) use of injectable non-insulin antidiabetic drugs: Secondary | ICD-10-CM | POA: Diagnosis not present

## 2022-06-16 DIAGNOSIS — I119 Hypertensive heart disease without heart failure: Secondary | ICD-10-CM | POA: Diagnosis not present

## 2022-06-16 DIAGNOSIS — I739 Peripheral vascular disease, unspecified: Secondary | ICD-10-CM | POA: Diagnosis not present

## 2022-06-16 DIAGNOSIS — E1122 Type 2 diabetes mellitus with diabetic chronic kidney disease: Secondary | ICD-10-CM | POA: Diagnosis not present

## 2022-06-16 DIAGNOSIS — Z7984 Long term (current) use of oral hypoglycemic drugs: Secondary | ICD-10-CM | POA: Diagnosis not present

## 2022-06-18 ENCOUNTER — Encounter (HOSPITAL_COMMUNITY): Payer: Self-pay | Admitting: Vascular Surgery

## 2022-06-20 ENCOUNTER — Other Ambulatory Visit: Payer: Self-pay

## 2022-06-20 DIAGNOSIS — I7143 Infrarenal abdominal aortic aneurysm, without rupture: Secondary | ICD-10-CM

## 2022-06-22 ENCOUNTER — Encounter: Payer: Self-pay | Admitting: Family Medicine

## 2022-07-03 ENCOUNTER — Ambulatory Visit: Payer: Medicare HMO | Admitting: Orthopedic Surgery

## 2022-07-03 VITALS — BP 172/77 | HR 98 | Ht 70.0 in | Wt 246.0 lb

## 2022-07-03 DIAGNOSIS — M48061 Spinal stenosis, lumbar region without neurogenic claudication: Secondary | ICD-10-CM | POA: Diagnosis not present

## 2022-07-03 DIAGNOSIS — G8929 Other chronic pain: Secondary | ICD-10-CM

## 2022-07-03 DIAGNOSIS — M5441 Lumbago with sciatica, right side: Secondary | ICD-10-CM

## 2022-07-03 NOTE — Patient Instructions (Addendum)
Physical therapy has been ordered for you at [facility]. They should call you to schedule, [phone] is the phone number to call, if you want to call to schedule. If you haven't heard from them in a few days, please call them to schedule your first appointment.

## 2022-07-03 NOTE — Progress Notes (Signed)
Office Visit Note   Patient: Juan [name]           Date of Birth: December 31, 1940           MRN: [id number] Visit Date: 07/03/2022 Requested by: Donita Brooks, MD [id number] [place],  [place] [address] PCP: Donita Brooks, MD  Subjective: Chief Complaint  Patient presents with   Back Pain    Hurting for 3 years no injury bothers me to walk sciatica on right side has diabetic neuropathy in feet    HPI: This is an 82 year old male comes in with 3-year history of lower back pain and fatigue when walking.  His wife says he has to lean over the shopping cart when going to the grocery store.  The patient is concerned that he can no longer walk is much as he used to.  He says he can only walk a certain number of feet and then he has to sit down because he is tired  He denies numbness or tingling related to this although he has some peripheral neuropathy                ROS: Bowel and bladder function remain intact no history of cancer  History of recent abdominal aortic aneurysm found on MRI back in 2012 at which time they also found degenerative disc disease.  Assessment & Plan:  Images personally read and my interpretation : Lumbar spine multilevels of disc space narrowing endplate irregularities and abnormal alignment.  MRI   IMPRESSION:   1. Lumbar spondylosis and degenerative disc disease resulting in  impingement at L2-3, L4-5, and L5-S1.  2.  Fusiform infrarenal abdominal aortic aneurysm, 3.5 cm in  diameter.  3. Several T2 hyperintense, T1 hypointense renal lesions noted;  the findings are statistically likely to represent cysts but are  not fully evaluated on today's lumbar spine MRI.   Original Report Authenticated By: Dellia Cloud, M.D.   Visit Diagnoses:  1. Spinal stenosis of lumbar region without neurogenic claudication   2. Chronic midline low back pain with right-sided sciatica     Plan: Recommend physical therapy patient is not a  surgical candidate.  He does not have any significant back pain just fatigue.  In actuality he complains of shortness of breath after walking so I would think this would be more cardiopulmonary related but he says his cardiac doctor has cleared him.  Follow-Up Instructions: Return if symptoms worsen or fail to improve.   Orders:  No orders of the defined types were placed in this encounter.     Objective: Vital Signs: BP (!) 172/77   Pulse 98   Ht  (1.778 m)   Wt 246 lb (111.6 kg)   BMI 35.30 kg/m   Physical Exam Vitals and nursing note reviewed.  Constitutional:      Appearance: Normal appearance.  HENT:     Head: Normocephalic and atraumatic.  Eyes:     General: No scleral icterus.       Right eye: No discharge.        Left eye: No discharge.     Extraocular Movements: Extraocular movements intact.     Conjunctiva/sclera: Conjunctivae normal.     Pupils: Pupils are equal, round, and reactive to light.  Cardiovascular:     Rate and Rhythm: Normal rate.     Pulses: Normal pulses.  Musculoskeletal:     Lumbar back: Negative right straight leg raise test and negative left  straight leg raise test.  Skin:    General: Skin is warm and dry.     Capillary Refill: Capillary refill takes less than 2 seconds.  Neurological:     General: No focal deficit present.     Mental Status: He is alert and oriented to person, place, and time.  Psychiatric:        Mood and Affect: Mood normal.        Behavior: Behavior normal.        Thought Content: Thought content normal.        Judgment: Judgment normal.      Back Exam   Tenderness  The patient is experiencing tenderness in the lumbar.  Range of Motion  Extension:  abnormal  Flexion:  abnormal   Tests  Straight leg raise right: negative Straight leg raise left: negative  Other  Gait: normal      He does have skin discoloration around his ankles and pretibial areas bilaterally without edema  Specialty  Comments:  No specialty comments available.  Imaging: No results found.   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   OSA (obstructive sleep apnea) 09/18/2019   Cardiac arrhythmia 08/21/2019   Cystoid macular edema of right eye 07/14/2019   Vitreomacular adhesion of left eye 07/14/2019   Cystoid macular edema of left eye 07/14/2019   Vitreomacular adhesion of both eyes 07/14/2019   Retinopathy due to secondary diabetes mellitus, with macular edema, with retinopathy 07/03/2019   CAD S/P percutaneous coronary angioplasty 07/03/2019   CKD (chronic kidney disease) stage 3, GFR 30-59 ml/min    Hypertension 05/09/2017   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    PAD (peripheral artery disease)    Personal history of colonic polyps 09/09/2012   Diabetes mellitus without complication    Hyperlipidemia    Neuromuscular disorder    CAD (coronary artery disease)    AAA (abdominal aortic aneurysm) 01/02/2012   Past Medical History:  Diagnosis Date   AAA (abdominal aortic aneurysm) 10/2010   3.5cm   Arthritis    CAD (coronary artery disease)    CKD (chronic kidney disease) stage 3, GFR 30-59 ml/min    Colon polyps    Diabetes mellitus without complication    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   Hyperlipidemia    Hypertension    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    50-69% (12/2015)   Neuromuscular disorder    L2-3,L5-S1 bulging disc /nerve impingement   PAD (peripheral artery disease)    Sleep apnea     Family History  Problem Relation Age of Onset   Diabetes Mother    Heart disease Mother        After age 29   Heart attack Mother     Hypertension Mother    Diabetes Father    Heart disease Father        After age 3   Hypertension Father    Heart attack Father    Brain cancer Sister    Cancer Sister        Brain   Diabetes Sister    Hypertension Sister    Cancer Brother  Chest Tumor   Diabetes Brother    Hypertension Brother    Deep vein thrombosis Sister    Diabetes Sister    Diabetes Brother        Bilateral leg   Colon cancer Neg Hx     Past Surgical History:  Procedure Laterality Date   ABDOMINAL AORTIC ENDOVASCULAR STENT GRAFT Bilateral 06/13/2022   Procedure: ABDOMINAL AORTIC ENDOVASCULAR STENT GRAFT;  Surgeon: Chuck Hint, MD;  Location: [facility] OR;  Service: Vascular;  Laterality: Bilateral;   CATARACT EXTRACTION W/ INTRAOCULAR LENS IMPLANT Bilateral    about 3 years ago   COLONOSCOPY N/A 09/27/2012   Procedure: COLONOSCOPY;  Surgeon: Corbin Ade, MD;  Location: [facility] SUITE;  Service: Endoscopy;  Laterality: N/A;  9:30   CORONARY ANGIOPLASTY     1989   TONSILLECTOMY     ULTRASOUND GUIDANCE FOR VASCULAR ACCESS Bilateral 06/13/2022   Procedure: ULTRASOUND GUIDANCE FOR VASCULAR ACCESS;  Surgeon: Chuck Hint, MD;  Location: [facility] OR;  Service: Vascular;  Laterality: Bilateral;   Social History   Occupational History   Occupation: retired    Associate Professor: RETIRED    Comment: [employer]   Tobacco Use   Smoking status: Former    Years: 35    Types: Cigarettes    Quit date: 01/02/1988    Years since quitting: 34.5   Smokeless tobacco: Never  Vaping Use   Vaping Use: Never used  Substance and Sexual Activity   Alcohol use: Not Currently    Comment: rare beer, no history of ETOH abuse   Drug use: No   Sexual activity: Not on file

## 2022-07-13 ENCOUNTER — Ambulatory Visit (HOSPITAL_COMMUNITY)
Admission: RE | Admit: 2022-07-13 | Discharge: 2022-07-13 | Disposition: A | Discharge status: Home / Self Care | Payer: Medicare HMO | Source: Ambulatory Visit | Attending: Vascular Surgery | Admitting: Vascular Surgery

## 2022-07-13 DIAGNOSIS — I7143 Infrarenal abdominal aortic aneurysm, without rupture: Secondary | ICD-10-CM | POA: Insufficient documentation

## 2022-07-13 MED ORDER — IOHEXOL 350 MG/ML SOLN
50.0000 mL | Freq: Once | INTRAVENOUS | Status: AC | PRN
  Administered 2022-07-13: 50 mL via INTRAVENOUS

## 2022-07-17 ENCOUNTER — Ambulatory Visit (HOSPITAL_COMMUNITY)
Admission: RE | Admit: 2022-07-17 | Discharge: 2022-07-17 | Disposition: A | Discharge status: Home / Self Care | Payer: Medicare HMO | Source: Ambulatory Visit | Attending: Surgery | Admitting: Surgery

## 2022-07-17 ENCOUNTER — Other Ambulatory Visit: Payer: Self-pay

## 2022-07-17 DIAGNOSIS — I739 Peripheral vascular disease, unspecified: Secondary | ICD-10-CM | POA: Diagnosis not present

## 2022-07-17 LAB — VAS US ABI WITH/WO TBI

## 2022-07-20 ENCOUNTER — Encounter: Payer: Medicare HMO | Admitting: Vascular Surgery

## 2022-07-25 ENCOUNTER — Ambulatory Visit (HOSPITAL_COMMUNITY): Payer: Medicare HMO | Attending: Orthopedic Surgery | Admitting: Physical Therapy

## 2022-07-25 ENCOUNTER — Ambulatory Visit (HOSPITAL_COMMUNITY): Payer: Medicare HMO | Admitting: Physical Therapy

## 2022-07-25 ENCOUNTER — Encounter (HOSPITAL_COMMUNITY): Payer: Self-pay | Admitting: Physical Therapy

## 2022-07-25 DIAGNOSIS — M5459 Other low back pain: Secondary | ICD-10-CM | POA: Diagnosis not present

## 2022-07-25 DIAGNOSIS — R29898 Other symptoms and signs involving the musculoskeletal system: Secondary | ICD-10-CM | POA: Diagnosis not present

## 2022-07-25 DIAGNOSIS — M6281 Muscle weakness (generalized): Secondary | ICD-10-CM | POA: Diagnosis not present

## 2022-07-25 DIAGNOSIS — G8929 Other chronic pain: Secondary | ICD-10-CM | POA: Diagnosis not present

## 2022-07-25 DIAGNOSIS — M5441 Lumbago with sciatica, right side: Secondary | ICD-10-CM | POA: Insufficient documentation

## 2022-07-25 DIAGNOSIS — R2689 Other abnormalities of gait and mobility: Secondary | ICD-10-CM | POA: Insufficient documentation

## 2022-07-25 NOTE — Therapy (Signed)
OUTPATIENT PHYSICAL THERAPY THORACOLUMBAR EVALUATION   Patient Name: Kayce Shangraw MRN: [id number] DOB:[birth year], 82 y.o., male Today's Date: 07/25/2022  END OF SESSION:  PT End of Session - 07/25/22 1433     Visit Number 1    Number of Visits 1    Date for PT Re-Evaluation 07/25/22    Authorization Type [employer]    PT Start Time 1435    PT Stop Time 1459    PT Time Calculation (min) 24 min    Activity Tolerance Patient tolerated treatment well    Behavior During Therapy [facility] for tasks assessed/performed             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    50-69% (12/2015)   Neuromuscular disorder (HCC)    L2-3,L5-S1 bulging disc /nerve impingement   PAD (peripheral artery disease) (HCC)    Sleep apnea    Past Surgical History:  Procedure Laterality Date   ABDOMINAL AORTIC ENDOVASCULAR STENT GRAFT Bilateral 06/13/2022   Procedure: ABDOMINAL AORTIC ENDOVASCULAR STENT GRAFT;  Surgeon: Chuck Hint, MD;  Location: [facility] OR;  Service: Vascular;  Laterality: Bilateral;   CATARACT EXTRACTION W/ INTRAOCULAR LENS IMPLANT Bilateral    about 3 years ago   COLONOSCOPY N/A 09/27/2012   Procedure: COLONOSCOPY;  Surgeon: Corbin Ade, MD;  Location: [facility]ITE;  Service: Endoscopy;  Laterality: N/A;  9:30   CORONARY ANGIOPLASTY     1989   TONSILLECTOMY     ULTRASOUND GUIDANCE FOR VASCULAR ACCESS Bilateral 06/13/2022   Procedure: ULTRASOUND GUIDANCE FOR VASCULAR ACCESS;  Surgeon: Chuck Hint, MD;  Location: [facility] OR;  Service: Vascular;  Laterality: Bilateral;   Patient  Active Problem List   Diagnosis Date Noted   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   Neuropathy associated with endocrine disorder (HCC) 05/25/2020   Restless legs syndrome (RLS) 05/25/2020   Retinal telangiectasis, 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   OSA (obstructive sleep apnea) 09/18/2019   Cardiac arrhythmia 08/21/2019   Cystoid macular edema of right eye 07/14/2019   Vitreomacular adhesion of left eye 07/14/2019   Cystoid macular edema of left eye 07/14/2019   Vitreomacular adhesion of both eyes 07/14/2019   Retinopathy due to secondary diabetes mellitus, with macular edema, 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HCC) 07/03/2019   CAD S/P percutaneous coronary angioplasty 07/03/2019   CKD (chronic kidney disease) stage 3, GFR 30-59 ml/min (HCC)    Hypertension 05/09/2017   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    PAD (peripheral artery disease) (HCC)    Personal history of colonic polyps 09/09/2012   Diabetes mellitus without complication (HCC)    Hyperlipidemia    Neuromuscular disorder (HCC)    CAD (coronary artery disease)    AAA (abdominal aortic aneurysm) (HCC) 01/02/2012    PCP: Lynnea Ferrier  MD  REFERRING PROVIDER: Vickki Hearing, MD  REFERRING DIAG: [phone] (ICD-10-CM) - Chronic midline low back pain with right-sided sciatica  Rationale for Evaluation and Treatment: Rehabilitation  THERAPY DIAG:  Other low back pain  Muscle weakness (generalized)  Other abnormalities of gait and mobility  Other symptoms and signs involving the musculoskeletal system  ONSET DATE: 3 years  SUBJECTIVE:                                                                                                                                                                                           SUBJECTIVE STATEMENT: Patient states he has arthritis in his back and isn't sure how PT will help. He notes impaired endurance and  trouble with walking, ADL. There has been a progressive decline and can't do what he wants or what he used to do. Was very active but has been more limited. Can walk about 60 feet before increase in symptoms.   PERTINENT HISTORY:  CAD, PAD, DM, HLD, HTN, neuropathy  PAIN:  Are you having pain? Yes: NPRS scale: 2/10 Pain location: feet Pain description: neuropathy Aggravating factors: constant Relieving factors: none  PRECAUTIONS: None  WEIGHT BEARING RESTRICTIONS: No  FALLS:  Has patient fallen in last 6 months? No  OCCUPATION: retired  PLOF: Independent  PATIENT GOALS: back not to hurt    OBJECTIVE:   DIAGNOSTIC FINDINGS:  CT 02/08/22 Mild loss of disc height from L1-L2 through L3-L4 with moderate loss of disc height at L4-L5. Mild facet degenerative changes in the lower lumbar spine. Anterior endplate osteophytes are noted throughout the lumbar spine, bridging at L2-L3. Slight curvature, convex the right, apex at L2-L3.  PATIENT SURVEYS:  Not completed as possible one time visit  SCREENING FOR RED FLAGS: Bowel or bladder incontinence: No Spinal tumors: No Cauda equina syndrome: No Compression fracture: No Abdominal aneurysm: No  COGNITION: Overall cognitive status: Within functional limits for tasks assessed     SENSATION: WFL   POSTURE: rounded shoulders, forward head, and increased thoracic kyphosis  Palpation:  Bilateral LE pitting edema   LUMBAR ROM:   AROM eval  Flexion 25% limited  Extension 90% limited  Right lateral flexion 50% limited  Left lateral flexion 50% limited  Right rotation 50% limited  Left rotation 50% limited   (Blank rows = not tested)  LOWER EXTREMITY ROM:     Active  Right eval Left eval  Hip flexion    Hip extension    Hip abduction    Hip adduction    Hip internal rotation    Hip external rotation    Knee flexion    Knee extension    Ankle dorsiflexion    Ankle plantarflexion    Ankle inversion    Ankle  eversion     (Blank rows = not tested)  LOWER EXTREMITY MMT:    MMT Right eval Left eval  Hip flexion 5 5  Hip extension    Hip abduction    Hip adduction    Hip internal rotation    Hip external rotation    Knee flexion 5 5  Knee extension 5 5  Ankle dorsiflexion 5 5  Ankle plantarflexion    Ankle inversion    Ankle eversion     (Blank rows = not tested)    FUNCTIONAL TESTS:    Transfer from STS: labored with UE support  GAIT: Distance walked: 60 feet Assistive device utilized: None Level of assistance: Complete Independence Comments: Trunk flexed, gradually increasing LBP toward end  TODAY'S TREATMENT:                                                                                                                              DATE:  07/25/22 Eval and education    PATIENT EDUCATION:  Education details: Patient educated on exam findings, POC, scope of PT, HEP, and benefits of PT. Person educated: Patient Education method: Explanation, Demonstration, and Handouts Education comprehension: verbalized understanding, returned demonstration, verbal cues required, and tactile cues required  HOME EXERCISE PROGRAM: N/a  ASSESSMENT:  CLINICAL IMPRESSION: Patient a 82 y.o. y.o. male who was seen today for physical therapy evaluation and treatment for LBP/endurance. Patient presents with pain limited deficits in lumbar spine and LE strength, ROM, endurance, activity tolerance, gait, and functional mobility with ADL. Patient is having to modify and restrict ADL as indicated by  subjective information and objective measures which is affecting overall participation. Patient does not wish to perform physical therapy at this time despite education on the benefits for his impairments and limitation but he does not wish to pursue. Educated on getting a new referral if needed. No additional PT needs at this time.   OBJECTIVE IMPAIRMENTS: Abnormal gait, decreased activity tolerance,  decreased balance, decreased endurance, decreased mobility, difficulty walking, decreased ROM, decreased strength, increased muscle spasms, impaired flexibility, improper body mechanics, postural dysfunction, and pain.   ACTIVITY LIMITATIONS: carrying, lifting, bending, standing, squatting, stairs, transfers, locomotion level, and caring for others  PARTICIPATION LIMITATIONS: meal prep, cleaning, laundry, shopping, community activity, and yard work  PERSONAL FACTORS: Age, Time since onset of injury/illness/exacerbation, and 3+ comorbidities: CAD, PAD, DM, HLD, HTN, neuropathy  are also affecting patient's functional outcome.   REHAB POTENTIAL: Fair    CLINICAL DECISION MAKING: Stable/uncomplicated  EVALUATION COMPLEXITY: Low   GOALS: Goals reviewed with patient? Yes  SHORT TERM GOALS: Target date: 07/25/22  Patient will be educated on exam findings, POC, scope of PT, and benefits of PT.  Baseline:  Goal status: MET     PLAN:  PT FREQUENCY: one time visit  PT DURATION: other: one time visit  PLANNED INTERVENTIONS: Therapeutic exercises, Therapeutic activity, Neuromuscular re-education, Balance training, Gait training, Patient/Family education, Joint manipulation, Joint mobilization, Stair training, Orthotic/Fit training, DME instructions, Aquatic Therapy, Dry Needling,  Electrical stimulation, Spinal manipulation, Spinal mobilization, Cryotherapy, Moist heat, Compression bandaging, scar mobilization, Splintting, Taping, Traction, Ultrasound, Ionotophoresis 4mg /ml Dexamethasone, and Manual therapy  PLAN FOR NEXT SESSION: n/a   Reola Mosher Marcia Hartwell, PT 07/25/2022, 3:01 PM

## 2022-08-03 ENCOUNTER — Ambulatory Visit (INDEPENDENT_AMBULATORY_CARE_PROVIDER_SITE_OTHER): Payer: Medicare HMO | Admitting: Vascular Surgery

## 2022-08-03 ENCOUNTER — Encounter: Payer: Self-pay | Admitting: Vascular Surgery

## 2022-08-03 ENCOUNTER — Other Ambulatory Visit: Payer: Self-pay

## 2022-08-03 VITALS — BP 150/74 | HR 73 | Temp 98.2°F | Resp 20 | Ht 70.0 in | Wt 246.0 lb

## 2022-08-03 DIAGNOSIS — I7143 Infrarenal abdominal aortic aneurysm, without rupture: Secondary | ICD-10-CM

## 2022-08-03 NOTE — Progress Notes (Signed)
REASON FOR VISIT:   Follow-up after endovascular aneurysm repair  MEDICAL ISSUES:   S/P ENDOVASCULAR ANEURYSM REPAIR: The patient is doing well status post endovascular aneurysm repair.  He denies any claudication and his ABIs are normal with normal Doppler signals in the foot.  However, his CT scan shows significant compression of the left iliac limb at the bifurcation where the 2 limbs crossed.  Given the risk of thrombosis of the left limb I have recommended that we do kissing bare-metal stents across this area to address this issue and lower his risk limb thrombosis.  Looks like I would use 12 mm x 6 cm (or possibly 4 cm) bare-metal stents at the bifurcation where the 2 limbs crossed.  We will try to limit contrast given his mild renal insufficiency.  This can be done as an outpatient and has been scheduled for 08/14/2022.  We have discussed the indications for the procedure and the potential complications and he is agreeable to proceed.   HPI:   Juan [name] is a pleasant 82 y.o. male who has been following with an infrarenal abdominal aortic aneurysm.  This enlarged to 5.7 cm.  Given the risk of rupture I recommended elective repair.  On 06/13/2022 he underwent endovascular repair of a 5.7 cm aneurysm percutaneously.  This was done with 3 pieces.  He comes in for a follow-up visit.  Today, he denies any specific complaints.  He denies claudication or rest pain.  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    50-69% (12/2015)   Neuromuscular disorder (HCC)    L2-3,L5-S1 bulging disc /nerve impingement   PAD  (peripheral artery disease) (HCC)    Sleep apnea     Family History  Problem Relation Age of Onset   Diabetes Mother    Heart disease Mother        After age 40   Heart attack Mother    Hypertension Mother    Diabetes Father    Heart disease Father        After age 34   Hypertension Father    Heart attack Father    Brain cancer Sister    Cancer Sister        Brain   Diabetes Sister    Hypertension Sister    Cancer Brother        Chest Tumor   Diabetes Brother    Hypertension Brother    Deep vein thrombosis Sister    Diabetes Sister    Diabetes Brother        Bilateral leg   Colon cancer Neg Hx     SOCIAL HISTORY: Social History   Tobacco Use   Smoking status: Former    Years: 35    Types: Cigarettes    Quit date: 01/02/1988    Years since quitting: 34.6   Smokeless tobacco: Never  Substance Use Topics   Alcohol use: Not Currently    Comment: rare beer, no history of ETOH abuse    Allergies  Allergen Reactions   Jardiance [Empagliflozin]  Nausea And Vomiting    Severe nausea-     Current Outpatient Medications  Medication Sig Dispense Refill   acetaminophen (TYLENOL) 500 MG tablet Take 1,000 mg by mouth every 6 (six) hours as needed for moderate pain.     allopurinol (ZYLOPRIM) 300 MG tablet TAKE 1 TABLET EVERY DAY 90 tablet 10   aspirin EC 81 MG tablet Take 81 mg by mouth daily.     Blood Glucose Monitoring Suppl (TRUE METRIX METER) w/Device KIT Use as Directed 1 kit 1   diltiazem (CARDIZEM CD) 240 MG 24 hr capsule TAKE 1 CAPSULE EVERY DAY 30 capsule 11   doxazosin (CARDURA) 8 MG tablet TAKE 1 TABLET EVERY DAY 90 tablet 3   Dulaglutide (TRULICITY) 1.5 MG/0.5ML SOPN INJECT 1.5 MG (0.5ML) UNDER THE SKIN ONCE A WEEK 4 mL 3   glipiZIDE (GLUCOTROL) 5 MG tablet TAKE 1 TABLET TWICE DAILY BEFORE MEALS 180 tablet 0   glucose blood (TRUE METRIX BLOOD GLUCOSE TEST) test strip 1 each by Other route daily. DX: E11.9 100 strip 5   Lancet Devices (PRODIGY LANCING  DEVICE) MISC Use bid DX e11.9 1 each 2   lisinopril (ZESTRIL) 40 MG tablet TAKE 1 TABLET EVERY DAY 90 tablet 10   metoprolol succinate (TOPROL-XL) 50 MG 24 hr tablet TAKE 1 TABLET EVERY DAY. TAKE WITH OR IMMEDIATELY FOLLOWING A MEAL (NEED MD APPOINTMENT FOR REFILLS) 30 tablet 11   Multiple Vitamin (MULTIVITAMIN WITH MINERALS) TABS Take 1 tablet by mouth daily.     Omega-3 Fatty Acids (FISH OIL) 1200 MG CAPS Take 1,200 mg by mouth 2 (two) times daily.     pantoprazole (PROTONIX) 20 MG tablet TAKE 1 TABLET EVERY DAY 90 tablet 10   rosuvastatin (CRESTOR) 20 MG tablet TAKE 1 TABLET EVERY DAY 90 tablet 3   TRUEplus Lancets 33G MISC TEST BLOOD SUGAR EVERY DAY 100 each 3   No current facility-administered medications for this visit.    REVIEW OF SYSTEMS:  [X]  denotes positive finding, [ ]  denotes negative finding Cardiac  Comments:  Chest pain or chest pressure:    Shortness of breath upon exertion:    Short of breath when lying flat:    Irregular heart rhythm:        Vascular    Pain in calf, thigh, or hip brought on by ambulation:    Pain in feet at night that wakes you up from your sleep:     Blood clot in your veins:    Leg swelling:         Pulmonary    Oxygen at home:    Productive cough:     Wheezing:         Neurologic    Sudden weakness in arms or legs:     Sudden numbness in arms or legs:     Sudden onset of difficulty speaking or slurred speech:    Temporary loss of vision in one eye:     Problems with dizziness:         Gastrointestinal    Blood in stool:     Vomited blood:         Genitourinary    Burning when urinating:     Blood in urine:        Psychiatric    Major depression:         Hematologic    Bleeding problems:    Problems with blood clotting too easily:        Skin  Rashes or ulcers:        Constitutional    Fever or chills:     PHYSICAL EXAM:   Vitals:   08/03/22 0837  BP: (!) 150/74  Pulse: 73  Resp: 20  Temp: 98.2 F (36.8 C)   SpO2: 96%  Weight: 246 lb (111.6 kg)  Height: 5\' 10"  (1.778 m)   GENERAL: The patient is a well-nourished male, in no acute distress. The vital signs are documented above. CARDIAC: There is a regular rate and rhythm.  VASCULAR: He has palpable femoral pulses. He has brisk biphasic posterior tibial signals bilaterally. PULMONARY: There is good air exchange bilaterally without wheezing or rales. ABDOMEN: Soft and non-tender with normal pitched bowel sounds.  MUSCULOSKELETAL: There are no major deformities or cyanosis. NEUROLOGIC: No focal weakness or paresthesias are detected. SKIN: There are no ulcers or rashes noted. PSYCHIATRIC: The patient has a normal affect.  DATA:    ARTERIAL DOPPLER STUDY: I have reviewed the arterial Doppler study that was done on 07/17/2022.  On the right side there is a triphasic posterior tibial and dorsalis pedis signal.  ABIs greater than 100%.  Toe pressures 144 mmHg.  On the left side there is a triphasic posterior tibial and dorsalis pedis signal.  ABIs greater than 100%.  Toe pressures 101 mmHg.  CT ABDOMEN PELVIS: The patient's endovascular graft is in good position with no evidence of endoleak.  There is compression of the left iliac limb however as it crosses anterior to the right iliac limb.  The aneurysm is unchanged in size.  Waverly Ferrari [facility] [phone]

## 2022-08-03 NOTE — H&P (View-Only) (Signed)
  REASON FOR VISIT:   Follow-up after endovascular aneurysm repair  MEDICAL ISSUES:   S/P ENDOVASCULAR ANEURYSM REPAIR: The patient is doing well status post endovascular aneurysm repair.  He denies any claudication and his ABIs are normal with normal Doppler signals in the foot.  However, his CT scan shows significant compression of the left iliac limb at the bifurcation where the 2 limbs crossed.  Given the risk of thrombosis of the left limb I have recommended that we do kissing bare-metal stents across this area to address this issue and lower his risk limb thrombosis.  Looks like I would use 12 mm x 6 cm (or possibly 4 cm) bare-metal stents at the bifurcation where the 2 limbs crossed.  We will try to limit contrast given his mild renal insufficiency.  This can be done as an outpatient and has been scheduled for 08/14/2022.  We have discussed the indications for the procedure and the potential complications and he is agreeable to proceed.   HPI:   Juan [name] is a pleasant 82 y.o. male who has been following with an infrarenal abdominal aortic aneurysm.  This enlarged to 5.7 cm.  Given the risk of rupture I recommended elective repair.  On 06/13/2022 he underwent endovascular repair of a 5.7 cm aneurysm percutaneously.  This was done with 3 pieces.  He comes in for a follow-up visit.  Today, he denies any specific complaints.  He denies claudication or rest pain.  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    50-69% (12/2015)   Neuromuscular disorder (HCC)    L2-3,L5-S1 bulging disc /nerve impingement   PAD  (peripheral artery disease) (HCC)    Sleep apnea     Family History  Problem Relation Age of Onset   Diabetes Mother    Heart disease Mother        After age 60   Heart attack Mother    Hypertension Mother    Diabetes Father    Heart disease Father        After age 60   Hypertension Father    Heart attack Father    Brain cancer Sister    Cancer Sister        Brain   Diabetes Sister    Hypertension Sister    Cancer Brother        Chest Tumor   Diabetes Brother    Hypertension Brother    Deep vein thrombosis Sister    Diabetes Sister    Diabetes Brother        Bilateral leg   Colon cancer Neg Hx     SOCIAL HISTORY: Social History   Tobacco Use   Smoking status: Former    Years: 35    Types: Cigarettes    Quit date: 01/02/1988    Years since quitting: 34.6   Smokeless tobacco: Never  Substance Use Topics   Alcohol use: Not Currently    Comment: rare beer, no history of ETOH abuse    Allergies  Allergen Reactions   Jardiance [Empagliflozin]   Nausea And Vomiting    Severe nausea-     Current Outpatient Medications  Medication Sig Dispense Refill   acetaminophen (TYLENOL) 500 MG tablet Take 1,000 mg by mouth every 6 (six) hours as needed for moderate pain.     allopurinol (ZYLOPRIM) 300 MG tablet TAKE 1 TABLET EVERY DAY 90 tablet 10   aspirin EC 81 MG tablet Take 81 mg by mouth daily.     Blood Glucose Monitoring Suppl (TRUE METRIX METER) w/Device KIT Use as Directed 1 kit 1   diltiazem (CARDIZEM CD) 240 MG 24 hr capsule TAKE 1 CAPSULE EVERY DAY 30 capsule 11   doxazosin (CARDURA) 8 MG tablet TAKE 1 TABLET EVERY DAY 90 tablet 3   Dulaglutide (TRULICITY) 1.5 MG/0.5ML SOPN INJECT 1.5 MG (0.5ML) UNDER THE SKIN ONCE A WEEK 4 mL 3   glipiZIDE (GLUCOTROL) 5 MG tablet TAKE 1 TABLET TWICE DAILY BEFORE MEALS 180 tablet 0   glucose blood (TRUE METRIX BLOOD GLUCOSE TEST) test strip 1 each by Other route daily. DX: E11.9 100 strip 5   Lancet Devices (PRODIGY LANCING  DEVICE) MISC Use bid DX e11.9 1 each 2   lisinopril (ZESTRIL) 40 MG tablet TAKE 1 TABLET EVERY DAY 90 tablet 10   metoprolol succinate (TOPROL-XL) 50 MG 24 hr tablet TAKE 1 TABLET EVERY DAY. TAKE WITH OR IMMEDIATELY FOLLOWING A MEAL (NEED MD APPOINTMENT FOR REFILLS) 30 tablet 11   Multiple Vitamin (MULTIVITAMIN WITH MINERALS) TABS Take 1 tablet by mouth daily.     Omega-3 Fatty Acids (FISH OIL) 1200 MG CAPS Take 1,200 mg by mouth 2 (two) times daily.     pantoprazole (PROTONIX) 20 MG tablet TAKE 1 TABLET EVERY DAY 90 tablet 10   rosuvastatin (CRESTOR) 20 MG tablet TAKE 1 TABLET EVERY DAY 90 tablet 3   TRUEplus Lancets 33G MISC TEST BLOOD SUGAR EVERY DAY 100 each 3   No current facility-administered medications for this visit.    REVIEW OF SYSTEMS:  [X] denotes positive finding, [ ] denotes negative finding Cardiac  Comments:  Chest pain or chest pressure:    Shortness of breath upon exertion:    Short of breath when lying flat:    Irregular heart rhythm:        Vascular    Pain in calf, thigh, or hip brought on by ambulation:    Pain in feet at night that wakes you up from your sleep:     Blood clot in your veins:    Leg swelling:         Pulmonary    Oxygen at home:    Productive cough:     Wheezing:         Neurologic    Sudden weakness in arms or legs:     Sudden numbness in arms or legs:     Sudden onset of difficulty speaking or slurred speech:    Temporary loss of vision in one eye:     Problems with dizziness:         Gastrointestinal    Blood in stool:     Vomited blood:         Genitourinary    Burning when urinating:     Blood in urine:        Psychiatric    Major depression:         Hematologic    Bleeding problems:    Problems with blood clotting too easily:        Skin      Rashes or ulcers:        Constitutional    Fever or chills:     PHYSICAL EXAM:   Vitals:   08/03/22 0837  BP: (!) 150/74  Pulse: 73  Resp: 20  Temp: 98.2 F (36.8 C)   SpO2: 96%  Weight: 246 lb (111.6 kg)  Height: 5' 10" (1.778 m)   GENERAL: The patient is a well-nourished male, in no acute distress. The vital signs are documented above. CARDIAC: There is a regular rate and rhythm.  VASCULAR: He has palpable femoral pulses. He has brisk biphasic posterior tibial signals bilaterally. PULMONARY: There is good air exchange bilaterally without wheezing or rales. ABDOMEN: Soft and non-tender with normal pitched bowel sounds.  MUSCULOSKELETAL: There are no major deformities or cyanosis. NEUROLOGIC: No focal weakness or paresthesias are detected. SKIN: There are no ulcers or rashes noted. PSYCHIATRIC: The patient has a normal affect.  DATA:    ARTERIAL DOPPLER STUDY: I have reviewed the arterial Doppler study that was done on 07/17/2022.  On the right side there is a triphasic posterior tibial and dorsalis pedis signal.  ABIs greater than 100%.  Toe pressures 144 mmHg.  On the left side there is a triphasic posterior tibial and dorsalis pedis signal.  ABIs greater than 100%.  Toe pressures 101 mmHg.  CT ABDOMEN PELVIS: The patient's endovascular graft is in good position with no evidence of endoleak.  There is compression of the left iliac limb however as it crosses anterior to the right iliac limb.  The aneurysm is unchanged in size.  Ciara Kagan [facility] [place] [facility] [phone] 

## 2022-08-14 ENCOUNTER — Encounter (HOSPITAL_COMMUNITY)
Admission: RE | Disposition: A | Discharge status: Home / Self Care | Payer: Self-pay | Source: Home / Self Care | Attending: Vascular Surgery

## 2022-08-14 ENCOUNTER — Ambulatory Visit (HOSPITAL_COMMUNITY)
Admission: RE | Admit: 2022-08-14 | Discharge: 2022-08-14 | Disposition: A | Discharge status: Home / Self Care | Payer: Medicare HMO | Attending: Vascular Surgery | Admitting: Vascular Surgery

## 2022-08-14 DIAGNOSIS — I714 Abdominal aortic aneurysm, without rupture, unspecified: Secondary | ICD-10-CM | POA: Insufficient documentation

## 2022-08-14 DIAGNOSIS — I771 Stricture of artery: Secondary | ICD-10-CM

## 2022-08-14 DIAGNOSIS — I7143 Infrarenal abdominal aortic aneurysm, without rupture: Secondary | ICD-10-CM

## 2022-08-14 HISTORY — PX: PERIPHERAL VASCULAR INTERVENTION: CATH118257

## 2022-08-14 HISTORY — PX: ABDOMINAL AORTOGRAM W/LOWER EXTREMITY: CATH118223

## 2022-08-14 LAB — POCT I-STAT, CHEM 8
BUN: 16 mg/dL (ref 8–23)
Calcium, Ion: 1.26 mmol/L (ref 1.15–1.40)
Chloride: 105 mmol/L (ref 98–111)
Creatinine, Ser: 1.6 mg/dL — ABNORMAL HIGH (ref 0.61–1.24)
Glucose, Bld: 159 mg/dL — ABNORMAL HIGH (ref 70–99)
HCT: 31 % — ABNORMAL LOW (ref 39.0–52.0)
Hemoglobin: 10.5 g/dL — ABNORMAL LOW (ref 13.0–17.0)
Potassium: 4 mmol/L (ref 3.5–5.1)
Sodium: 142 mmol/L (ref 135–145)
TCO2: 24 mmol/L (ref 22–32)

## 2022-08-14 LAB — GLUCOSE, CAPILLARY
Glucose-Capillary: 147 mg/dL — ABNORMAL HIGH (ref 70–99)
Glucose-Capillary: 197 mg/dL — ABNORMAL HIGH (ref 70–99)

## 2022-08-14 LAB — POCT ACTIVATED CLOTTING TIME: Activated Clotting Time: 163 seconds

## 2022-08-14 SURGERY — ABDOMINAL AORTOGRAM W/LOWER EXTREMITY
Anesthesia: LOCAL

## 2022-08-14 MED ORDER — SODIUM CHLORIDE 0.9 % IV SOLN: INTRAVENOUS | Status: DC

## 2022-08-14 MED ORDER — MIDAZOLAM HCL 2 MG/2ML IJ SOLN
INTRAMUSCULAR | Status: DC | PRN
  Administered 2022-08-14 (×2): 1 mg via INTRAVENOUS

## 2022-08-14 MED ORDER — FENTANYL CITRATE (PF) 100 MCG/2ML IJ SOLN: 25.0000 ug | Freq: Once | INTRAMUSCULAR | Status: AC

## 2022-08-14 MED ORDER — LIDOCAINE HCL (PF) 1 % IJ SOLN
INTRAMUSCULAR | Status: DC | PRN
  Administered 2022-08-14 (×2): 15 mL

## 2022-08-14 MED ORDER — LABETALOL HCL 5 MG/ML IV SOLN
INTRAVENOUS | Status: DC | PRN
  Administered 2022-08-14: 10 mg via INTRAVENOUS

## 2022-08-14 MED ORDER — CLOPIDOGREL BISULFATE 75 MG PO TABS: 75.0000 mg | ORAL_TABLET | Freq: Every day | ORAL | Status: DC

## 2022-08-14 MED ORDER — HYDRALAZINE HCL 20 MG/ML IJ SOLN
INTRAMUSCULAR | Status: AC
  Filled 2022-08-14: qty 1

## 2022-08-14 MED ORDER — HEPARIN SODIUM (PORCINE) 1000 UNIT/ML IJ SOLN
INTRAMUSCULAR | Status: DC | PRN
  Administered 2022-08-14: 10000 [IU] via INTRAVENOUS

## 2022-08-14 MED ORDER — CLOPIDOGREL BISULFATE 300 MG PO TABS
ORAL_TABLET | ORAL | Status: DC | PRN
  Administered 2022-08-14: 75 mg via ORAL

## 2022-08-14 MED ORDER — CLOPIDOGREL BISULFATE 75 MG PO TABS
ORAL_TABLET | ORAL | Status: AC
  Filled 2022-08-14: qty 1

## 2022-08-14 MED ORDER — SODIUM CHLORIDE 0.9 % WEIGHT BASED INFUSION
1.0000 mL/kg/h | INTRAVENOUS | Status: DC
  Administered 2022-08-14: 1 mL/kg/h via INTRAVENOUS

## 2022-08-14 MED ORDER — LABETALOL HCL 5 MG/ML IV SOLN: 10.0000 mg | INTRAVENOUS | Status: DC | PRN

## 2022-08-14 MED ORDER — CLOPIDOGREL BISULFATE 75 MG PO TABS: 75.0000 mg | ORAL_TABLET | Freq: Every day | ORAL | 2 refills | Status: AC

## 2022-08-14 MED ORDER — HEPARIN (PORCINE) IN NACL 1000-0.9 UT/500ML-% IV SOLN
INTRAVENOUS | Status: DC | PRN
  Administered 2022-08-14 (×2): 500 mL

## 2022-08-14 MED ORDER — SODIUM CHLORIDE 0.9% FLUSH: 3.0000 mL | INTRAVENOUS | Status: DC | PRN

## 2022-08-14 MED ORDER — MIDAZOLAM HCL 2 MG/2ML IJ SOLN
INTRAMUSCULAR | Status: AC
  Filled 2022-08-14: qty 2

## 2022-08-14 MED ORDER — HYDRALAZINE HCL 20 MG/ML IJ SOLN: 5.0000 mg | INTRAMUSCULAR | Status: DC | PRN

## 2022-08-14 MED ORDER — ACETAMINOPHEN 325 MG PO TABS: 650.0000 mg | ORAL_TABLET | ORAL | Status: DC | PRN

## 2022-08-14 MED ORDER — FENTANYL CITRATE (PF) 100 MCG/2ML IJ SOLN
INTRAMUSCULAR | Status: AC
  Administered 2022-08-14: 25 ug via INTRAVENOUS
  Filled 2022-08-14: qty 2

## 2022-08-14 MED ORDER — LABETALOL HCL 5 MG/ML IV SOLN
INTRAVENOUS | Status: AC
  Filled 2022-08-14: qty 4

## 2022-08-14 MED ORDER — ONDANSETRON HCL 4 MG/2ML IJ SOLN: 4.0000 mg | Freq: Four times a day (QID) | INTRAMUSCULAR | Status: DC | PRN

## 2022-08-14 MED ORDER — HYDRALAZINE HCL 20 MG/ML IJ SOLN
INTRAMUSCULAR | Status: DC | PRN
  Administered 2022-08-14 (×2): 10 mg via INTRAVENOUS

## 2022-08-14 MED ORDER — SODIUM CHLORIDE 0.9 % IV SOLN: 250.0000 mL | INTRAVENOUS | Status: DC | PRN

## 2022-08-14 MED ORDER — PROTAMINE SULFATE 10 MG/ML IV SOLN
INTRAVENOUS | Status: DC | PRN
  Administered 2022-08-14: 5 mg via INTRAVENOUS
  Administered 2022-08-14: 15 mg via INTRAVENOUS

## 2022-08-14 MED ORDER — FENTANYL CITRATE (PF) 100 MCG/2ML IJ SOLN
INTRAMUSCULAR | Status: DC | PRN
  Administered 2022-08-14: 50 ug via INTRAVENOUS

## 2022-08-14 MED ORDER — FENTANYL CITRATE (PF) 100 MCG/2ML IJ SOLN
INTRAMUSCULAR | Status: AC
  Filled 2022-08-14: qty 2

## 2022-08-14 MED ORDER — IODIXANOL 320 MG/ML IV SOLN
INTRAVENOUS | Status: DC | PRN
  Administered 2022-08-14: 20 mL

## 2022-08-14 MED ORDER — PROTAMINE SULFATE 10 MG/ML IV SOLN
INTRAVENOUS | Status: AC
  Filled 2022-08-14: qty 5

## 2022-08-14 MED ORDER — LIDOCAINE HCL (PF) 1 % IJ SOLN
INTRAMUSCULAR | Status: AC
  Filled 2022-08-14: qty 30

## 2022-08-14 MED ORDER — SODIUM CHLORIDE 0.9% FLUSH: 3.0000 mL | Freq: Two times a day (BID) | INTRAVENOUS | Status: DC

## 2022-08-14 MED ORDER — HEPARIN SODIUM (PORCINE) 1000 UNIT/ML IJ SOLN
INTRAMUSCULAR | Status: AC
  Filled 2022-08-14: qty 10

## 2022-08-14 SURGICAL SUPPLY — 17 items
CATH ANGIO 5F BER2 65CM (CATHETERS) IMPLANT
CATH ANGIO 5F PIGTAIL 65CM (CATHETERS) IMPLANT
GLIDEWIRE ADV .035X180CM (WIRE) IMPLANT
KIT ENCORE 26 ADVANTAGE (KITS) IMPLANT
KIT MICROPUNCTURE NIT STIFF (SHEATH) IMPLANT
KIT PV (KITS) ×2 IMPLANT
SHEATH BRITE TIP 7FR 35CM (SHEATH) IMPLANT
SHEATH PINNACLE 5F 10CM (SHEATH) IMPLANT
STENT OMNILINK ELITE 10X29X80 (Permanent Stent) IMPLANT
STENT OMNILINK ELITE 10X39X80 (Permanent Stent) IMPLANT
STOPCOCK MORSE 400PSI 3WAY (MISCELLANEOUS) IMPLANT
SYR MEDRAD MARK 7 150ML (SYRINGE) ×2 IMPLANT
TRANSDUCER W/STOPCOCK (MISCELLANEOUS) ×2 IMPLANT
TRAY PV CATH (CUSTOM PROCEDURE TRAY) ×2 IMPLANT
TUBING CIL FLEX 10 FLL-RA (TUBING) IMPLANT
WIRE HITORQ VERSACORE ST 145CM (WIRE) IMPLANT
WIRE ROSEN-J .035X260CM (WIRE) IMPLANT

## 2022-08-14 NOTE — Interval H&P Note (Signed)
History and Physical Interval Note:  08/14/2022 7:18 AM  Juan [name]  has presented today for surgery, with the diagnosis of compresention of left limb of evar.  The various methods of treatment have been discussed with the patient and family. After consideration of risks, benefits and other options for treatment, the patient has consented to  Procedure(s): ABDOMINAL AORTOGRAM W/LOWER EXTREMITY (N/A) as a surgical intervention.  The patient's history has been reviewed, patient examined, no change in status, stable for surgery.  I have reviewed the patient's chart and labs.  Questions were answered to the patient's satisfaction.     Juan [name]

## 2022-08-14 NOTE — Op Note (Signed)
   PATIENT: Juan [name]      MRN: [id number] DOB: 05-29-1940    DATE OF PROCEDURE: 08/14/2022  INDICATIONS:    Juan [name] is a 82 y.o. male who underwent endovascular repair of an abdominal aortic aneurysm (5.7 cm) on 06/13/2022.  On his 1 month follow-up study had compression of the left iliac limb.  He presents for angioplasty and stenting.  PROCEDURE:    Conscious sedation Ultrasound-guided access to bilateral common femoral arteries Aortogram with iliac arteriogram Kissing balloon angioplasty and stenting of bilateral common iliac artery stents (10 x 39 and 10 x 29 balloon expandable stents)  SURGEON: Di Kindle. Edilia Bo, MD, FACS  ANESTHESIA: Local with sedation  EBL: Minimal  TECHNIQUE: The patient was brought to the peripheral vascular lab and was sedated. The period of conscious sedation was 82 minutes.  During that time period, I was present face-to-face 100% of the time.  The patient was administered initially 1 mg of Versed and 50 mcg of fentanyl.  He later received an additional milligram of Versed.. The patient's heart rate, blood pressure, and oxygen saturation were monitored by the nurse continuously during the procedure.  Both groins were prepped and draped in the usual sterile fashion.  Under ultrasound guidance, after the skin was anesthetized, I cannulated the left common femoral artery with a micropuncture needle and a micropuncture sheath was introduced over a wire.  This was exchanged for a 5 [place] sheath over a Bentson wire.  By ultrasound the femoral artery was patent. A real-time image was obtained and sent to the server.  Likewise, femoral access was obtained on the right side.  Initially because of the tortuosity of the vessel I had a difficult time advancing the sheath but after resticking the vessel I was able to get a wire a stiffer wire up higher and easily advance a sheath.  The 5 French sheaths on each side were exchanged for 7 [place] sheaths.  These  were positioned below the stent graft.  I position a pigtail catheter within the proximal graft and an aortogram with iliac arteriogram was obtained.  This demonstrated the area of impingement.  The patient was heparinized and ACT was monitored throughout the procedure.  A 10 mm x 39 mm balloon expandable stent was placed up the left side and positioned across the area of narrowing.  A 10 mm x 29 mm self-expanding stent was positioned up the right side where the vessels overlapped.  The balloon on the left was inflated initially to nominal pressure.  Then with this balloon inflated the balloon on the right was inflated to nominal pressure.  Completion film was obtained and a 40 degree RAO projection which demonstrated widely patent iliac limbs bilaterally.  The wires were removed and the patient transferred to the holding area for removal of the sheath.  No immediate complications were noted.  FINDINGS:   Compression of left iliac limb of endovascular stent graft.  This was successfully addressed with kissing balloon angioplasty using 10 mm in diameter balloon expandable stents as described above. The stent graft is otherwise in excellent position.  The common iliacs, external iliacs, and hypogastric arteries are patent.   Waverly Ferrari, MD, FACS [facility]  DATE OF DICTATION:   08/14/2022

## 2022-08-14 NOTE — Progress Notes (Signed)
Site area- bilateral  Site Prior to Removal- 0   Pressure Applied For-  35 MInutes   Bedrest Beginning at - 1058   Manual- Yes   Patient Status During Pull- Stable    Post Pull Groin Site- 0   Post Pull Instructions Given- Yes   Post Pull Pulses Present- Yes   via doppler   Dressing Applied- Tegaderm and Gauze Dressing    Comments:  sheaths pulled by Wyn Forster, 2H RN

## 2022-08-15 ENCOUNTER — Encounter (HOSPITAL_COMMUNITY): Payer: Self-pay | Admitting: Vascular Surgery

## 2022-08-16 LAB — POCT ACTIVATED CLOTTING TIME
Activated Clotting Time: 244 seconds
Activated Clotting Time: 266 seconds

## 2022-08-21 ENCOUNTER — Other Ambulatory Visit: Payer: Self-pay | Admitting: Family Medicine

## 2022-08-22 NOTE — Telephone Encounter (Signed)
Requested medication (s) are due for refill today: yes  Requested medication (s) are on the active medication list: yes  Last refill:  06/12/22  Future visit scheduled: no  Notes to clinic:  Unable to refill per protocol, courtesy refill already given, routing for provider approval.      Requested Prescriptions  Pending Prescriptions Disp Refills   glipiZIDE (GLUCOTROL) 5 MG tablet [Pharmacy Med Name: GLIPIZIDE 5 MG Tablet] 180 tablet 3    Sig: TAKE 1 TABLET TWICE DAILY BEFORE MEALS (NEED MD APPOINTMENT FOR REFILLS)     Endocrinology:  Diabetes - Sulfonylureas Failed - 08/21/2022  3:11 AM      Failed - Cr in normal range and within 360 days    Creat  Date Value Ref Range Status  12/08/2021 1.52 (H) 0.70 - 1.22 mg/dL Final   Creatinine, Ser  Date Value Ref Range Status  08/14/2022 1.60 (H) 0.61 - 1.24 mg/dL Final   Creatinine, Urine  Date Value Ref Range Status  05/14/2020 116 20 - 320 mg/dL Final         Failed - Valid encounter within last 6 months    Recent Outpatient Visits           1 year ago Diabetes mellitus without complication (HCC)   [facility] Juan [name], Juan Heidelberg, MD   1 year ago Irregular heart beat   [facility] Juan [name], Juan Heidelberg, MD   2 years ago Essential hypertension   [facility] Donita Brooks, MD   2 years ago Iron deficiency anemia, unspecified iron deficiency anemia type   [facility] Juan [name], Juan Heidelberg, MD   2 years ago Shortness of breath   [facility] Juan [name], Juan Heidelberg, MD       Future Appointments             In 3 months Mallipeddi, Orion Modest, MD [facility] [place] [facility]            Passed - HBA1C is between 0 and 7.9 and within 180 days    Hgb A1c MFr Bld  Date Value Ref Range Status  06/05/2022 7.9 (H) 4.8 - 5.6 % Final    Comment:    (NOTE)         Prediabetes: 5.7 - 6.4         Diabetes: >6.4         Glycemic control  for adults with diabetes: <7.0

## 2022-09-25 ENCOUNTER — Other Ambulatory Visit: Payer: Medicare HMO

## 2022-09-26 ENCOUNTER — Other Ambulatory Visit: Payer: Medicare HMO

## 2022-09-26 DIAGNOSIS — E785 Hyperlipidemia, unspecified: Secondary | ICD-10-CM

## 2022-09-26 DIAGNOSIS — E119 Type 2 diabetes mellitus without complications: Secondary | ICD-10-CM | POA: Diagnosis not present

## 2022-09-26 DIAGNOSIS — I2510 Atherosclerotic heart disease of native coronary artery without angina pectoris: Secondary | ICD-10-CM

## 2022-09-26 DIAGNOSIS — I10 Essential (primary) hypertension: Secondary | ICD-10-CM | POA: Diagnosis not present

## 2022-09-26 LAB — CBC WITH DIFFERENTIAL/PLATELET
Absolute Monocytes: 400 cells/uL (ref 200–950)
MCHC: 32.5 g/dL (ref 32.0–36.0)
MPV: 9.5 fL (ref 7.5–12.5)
Neutrophils Relative %: 68.4 %
WBC: 4.7 10*3/uL (ref 3.8–10.8)

## 2022-09-27 LAB — CBC WITH DIFFERENTIAL/PLATELET
Basophils Absolute: 19 cells/uL (ref 0–200)
Basophils Relative: 0.4 %
Eosinophils Absolute: 141 cells/uL (ref 15–500)
Eosinophils Relative: 3 %
HCT: 32.6 % — ABNORMAL LOW (ref 38.5–50.0)
Hemoglobin: 10.6 g/dL — ABNORMAL LOW (ref 13.2–17.1)
Lymphs Abs: 926 cells/uL (ref 850–3900)
MCH: 30.6 pg (ref 27.0–33.0)
MCV: 94.2 fL (ref 80.0–100.0)
Monocytes Relative: 8.5 %
Neutro Abs: 3215 cells/uL (ref 1500–7800)
Platelets: 106 10*3/uL — ABNORMAL LOW (ref 140–400)
RBC: 3.46 10*6/uL — ABNORMAL LOW (ref 4.20–5.80)
RDW: 14 % (ref 11.0–15.0)
Total Lymphocyte: 19.7 %

## 2022-09-27 LAB — LIPID PANEL
Cholesterol: 127 mg/dL (ref <200)
HDL: 35 mg/dL — ABNORMAL LOW (ref >=40)
LDL Cholesterol (Calc): 67 mg/dL (calc)
Non-HDL Cholesterol (Calc): 92 mg/dL (calc) (ref <130)
Total CHOL/HDL Ratio: 3.6 (calc) (ref <5.0)
Triglycerides: 171 mg/dL — ABNORMAL HIGH (ref <150)

## 2022-09-27 LAB — COMPLETE METABOLIC PANEL WITH GFR
AG Ratio: 1.8 (calc) (ref 1.0–2.5)
ALT: 16 U/L (ref 9–46)
AST: 16 U/L (ref 10–35)
Albumin: 4 g/dL (ref 3.6–5.1)
Alkaline phosphatase (APISO): 62 U/L (ref 35–144)
BUN/Creatinine Ratio: 14 (calc) (ref 6–22)
BUN: 21 mg/dL (ref 7–25)
CO2: 25 mmol/L (ref 20–32)
Calcium: 8.9 mg/dL (ref 8.6–10.3)
Chloride: 107 mmol/L (ref 98–110)
Creat: 1.49 mg/dL — ABNORMAL HIGH (ref 0.70–1.22)
Globulin: 2.2 g/dL (calc) (ref 1.9–3.7)
Glucose, Bld: 190 mg/dL — ABNORMAL HIGH (ref 65–99)
Potassium: 3.8 mmol/L (ref 3.5–5.3)
Sodium: 142 mmol/L (ref 135–146)
Total Bilirubin: 0.6 mg/dL (ref 0.2–1.2)
Total Protein: 6.2 g/dL (ref 6.1–8.1)
eGFR: 46 mL/min/{1.73_m2} — ABNORMAL LOW (ref >=60)

## 2022-09-27 LAB — HEMOGLOBIN A1C
Hgb A1c MFr Bld: 7.1 % of total Hgb — ABNORMAL HIGH (ref <5.7)
Mean Plasma Glucose: 157 mg/dL
eAG (mmol/L): 8.7 mmol/L

## 2022-09-27 LAB — VITAMIN B12: Vitamin B-12: 421 pg/mL (ref 200–1100)

## 2022-09-28 ENCOUNTER — Other Ambulatory Visit: Payer: Self-pay | Admitting: *Deleted

## 2022-09-28 DIAGNOSIS — I7143 Infrarenal abdominal aortic aneurysm, without rupture: Secondary | ICD-10-CM

## 2022-09-28 DIAGNOSIS — I739 Peripheral vascular disease, unspecified: Secondary | ICD-10-CM

## 2022-09-29 ENCOUNTER — Ambulatory Visit (INDEPENDENT_AMBULATORY_CARE_PROVIDER_SITE_OTHER): Payer: Medicare HMO | Admitting: Family Medicine

## 2022-09-29 ENCOUNTER — Encounter: Payer: Self-pay | Admitting: Family Medicine

## 2022-09-29 VITALS — BP 126/76 | HR 93 | Temp 97.8°F | Ht 70.0 in | Wt 247.0 lb

## 2022-09-29 DIAGNOSIS — I10 Essential (primary) hypertension: Secondary | ICD-10-CM | POA: Diagnosis not present

## 2022-09-29 DIAGNOSIS — I2510 Atherosclerotic heart disease of native coronary artery without angina pectoris: Secondary | ICD-10-CM | POA: Diagnosis not present

## 2022-09-29 DIAGNOSIS — E119 Type 2 diabetes mellitus without complications: Secondary | ICD-10-CM | POA: Diagnosis not present

## 2022-09-29 NOTE — Progress Notes (Signed)
Subjective:    Patient ID: Juan [name], male    DOB: Jan 23, 1941, 82 y.o.   MRN: [id number] Patient is here today for his regular follow-up.  He denies any chest pain shortness of breath or dyspnea on exertion.  His blood pressure today is well-controlled.  Since I last saw the patient, he had a repair of his AAA.  He sees his vascular surgeon in August.  He does have pitting edema in both legs distal to the knee that is +1.  His lungs are clear to auscultation.  An echocardiogram earlier this year showed a normal ejection fraction but it did show diastolic dysfunction.  Today he has no evidence of overt heart failure.  We have previously tried the patient on Jardiance but he had to stop the medication due to nausea and vomiting. Lab on 09/26/2022  Component Date Value Ref Range Status   WBC 09/26/2022 4.7  3.8 - 10.8 Thousand/uL Final   RBC 09/26/2022 3.46 (L)  4.20 - 5.80 Million/uL Final   Hemoglobin 09/26/2022 10.6 (L)  13.2 - 17.1 g/dL Final   HCT 40/98/1191 32.6 (L)  38.5 - 50.0 % Final   MCV 09/26/2022 94.2  80.0 - 100.0 fL Final   MCH 09/26/2022 30.6  27.0 - 33.0 pg Final   MCHC 09/26/2022 32.5  32.0 - 36.0 g/dL Final   RDW 47/82/9562 14.0  11.0 - 15.0 % Final   Platelets 09/26/2022 106 (L)  140 - 400 Thousand/uL Final   MPV 09/26/2022 9.5  7.5 - 12.5 fL Final   Neutro Abs 09/26/2022 3,215  1,500 - 7,800 cells/uL Final   Lymphs Abs 09/26/2022 926  850 - 3,900 cells/uL Final   Absolute Monocytes 09/26/2022 400  200 - 950 cells/uL Final   Eosinophils Absolute 09/26/2022 141  15 - 500 cells/uL Final   Basophils Absolute 09/26/2022 19  0 - 200 cells/uL Final   Neutrophils Relative % 09/26/2022 68.4  % Final   Total Lymphocyte 09/26/2022 19.7  % Final   Monocytes Relative 09/26/2022 8.5  % Final   Eosinophils Relative 09/26/2022 3.0  % Final   Basophils Relative 09/26/2022 0.4  % Final   Glucose, Bld 09/26/2022 190 (H)  65 - 99 mg/dL Final   Comment: .            Fasting  reference interval . For someone without known diabetes, a glucose value >125 mg/dL indicates that they may have diabetes and this should be confirmed with a follow-up test. .    BUN 09/26/2022 21  7 - 25 mg/dL Final   Creat 13/10/6576 1.49 (H)  0.70 - 1.22 mg/dL Final   eGFR 46/96/2952 46 (L)  > OR = 60 mL/min/1.47m2 Final   BUN/Creatinine Ratio 09/26/2022 14  6 - 22 (calc) Final   Sodium 09/26/2022 142  135 - 146 mmol/L Final   Potassium 09/26/2022 3.8  3.5 - 5.3 mmol/L Final   Chloride 09/26/2022 107  98 - 110 mmol/L Final   CO2 09/26/2022 25  20 - 32 mmol/L Final   Calcium 09/26/2022 8.9  8.6 - 10.3 mg/dL Final   Total Protein 84/13/2440 6.2  6.1 - 8.1 g/dL Final   Albumin 01/07/2535 4.0  3.6 - 5.1 g/dL Final   Globulin 64/40/3474 2.2  1.9 - 3.7 g/dL (calc) Final   AG Ratio 09/26/2022 1.8  1.0 - 2.5 (calc) Final   Total Bilirubin 09/26/2022 0.6  0.2 - 1.2 mg/dL Final   Alkaline phosphatase (APISO) 09/26/2022  62  35 - 144 U/L Final   AST 09/26/2022 16  10 - 35 U/L Final   ALT 09/26/2022 16  9 - 46 U/L Final   Hgb A1c MFr Bld 09/26/2022 7.1 (H)  <5.7 % of total Hgb Final   Comment: For someone without known diabetes, a hemoglobin A1c value of 6.5% or greater indicates that they may have  diabetes and this should be confirmed with a follow-up  test. . For someone with known diabetes, a value <7% indicates  that their diabetes is well controlled and a value  greater than or equal to 7% indicates suboptimal  control. A1c targets should be individualized based on  duration of diabetes, age, comorbid conditions, and  other considerations. . Currently, no consensus exists regarding use of hemoglobin A1c for diagnosis of diabetes for children. .    Mean Plasma Glucose 09/26/2022 157  mg/dL Final   eAG (mmol/L) 16/12/9602 8.7  mmol/L Final   Comment: . This test was performed on the Roche cobas c503 platform. Effective 12/19/21, a change in test platforms from the Abbott  Architect to the Roche cobas c503 may have shifted HbA1c results compared to historical results. Based on laboratory validation testing conducted at [facility], the Roche platform relative to the Abbott platform had an average increase in HbA1c value of < or = 0.3%. This difference is within accepted  variability established by the [facility]. Note that not all individuals will have had a shift in their results and direct comparisons between historical and current results for testing conducted on different platforms is not recommended.    Cholesterol 09/26/2022 127  <200 mg/dL Final   HDL 54/11/8117 35 (L)  > OR = 40 mg/dL Final   Triglycerides 14/78/2956 171 (H)  <150 mg/dL Final   LDL Cholesterol (Calc) 09/26/2022 67  mg/dL (calc) Final   Comment: Reference range: <100 . Desirable range <100 mg/dL for primary prevention;   <70 mg/dL for patients with CHD or diabetic patients  with > or = 2 CHD risk factors. Marland Kitchen LDL-C is now calculated using the Martin-Hopkins  calculation, which is a validated novel method providing  better accuracy than the Friedewald equation in the  estimation of LDL-C.  Horald Pollen et al. Lenox Ahr. 2130;865(78): 2061-2068  (http://education.[facility].com/faq/FAQ164)    Total CHOL/HDL Ratio 09/26/2022 3.6  <4.6 (calc) Final   Non-HDL Cholesterol (Calc) 09/26/2022 92  <130 mg/dL (calc) Final   Comment: For patients with diabetes plus 1 major ASCVD risk  factor, treating to a non-HDL-C goal of <100 mg/dL  (LDL-C of <96 mg/dL) is considered a therapeutic  option.    Vitamin B-12 09/26/2022 421  200 - 1,100 pg/mL Final    . 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    50-69% (12/2015)   Neuromuscular disorder (HCC)    L2-3,L5-S1 bulging disc /nerve impingement   PAD (peripheral artery disease) (HCC)    Sleep apnea    Past Surgical History:  Procedure Laterality Date   ABDOMINAL AORTIC ENDOVASCULAR STENT GRAFT Bilateral  06/13/2022   Procedure: ABDOMINAL AORTIC ENDOVASCULAR STENT GRAFT;  Surgeon: Chuck Hint, MD;  Location: [facility] OR;  Service: Vascular;  Laterality: Bilateral;   ABDOMINAL AORTOGRAM W/LOWER EXTREMITY N/A 08/14/2022   Procedure: ABDOMINAL AORTOGRAM W/LOWER EXTREMITY;  Surgeon: Chuck Hint, MD;  Location: [facility] INVASIVE CV LAB;  Service: Cardiovascular;  Laterality: N/A;   CATARACT EXTRACTION W/ INTRAOCULAR LENS IMPLANT Bilateral    about 3 years ago   COLONOSCOPY N/A 09/27/2012   Procedure: COLONOSCOPY;  Surgeon: Corbin Ade, MD;  Location[facility];  Service: Endoscopy;  Laterality: N/A;  9:30   CORONARY ANGIOPLASTY     1989   PERIPHERAL VASCULAR INTERVENTION Bilateral 08/14/2022   Procedure: PERIPHERAL VASCULAR INTERVENTION;  Surgeon: Chuck Hint, MD;  Location[facility] INVASIVE CV LAB;  Service: Cardiovascular;  Laterality: Bilateral;  Iliac   TONSILLECTOMY     ULTRASOUND GUIDANCE FOR VASCULAR ACCESS Bilateral 06/13/2022   Procedure: ULTRASOUND GUIDANCE FOR VASCULAR ACCESS;  Surgeon: Chuck Hint, MD;  Location[facility] OR;  Service: Vascular;  Laterality: Bilateral;   Current Outpatient Medications on File Prior to Visit  Medication Sig Dispense Refill   acetaminophen (TYLENOL) 500 MG tablet Take 1,000 mg by mouth every 6 (six) hours as needed for moderate pain.     allopurinol (ZYLOPRIM) 300 MG tablet TAKE 1 TABLET EVERY DAY 90 tablet 10   aspirin EC 81 MG tablet Take 81 mg by mouth daily.     Blood Glucose Monitoring Suppl (TRUE METRIX METER) w/Device KIT Use as Directed 1 kit 1    clopidogrel (PLAVIX) 75 MG tablet Take 1 tablet (75 mg total) by mouth daily. 30 tablet 2   diltiazem (CARDIZEM CD) 240 MG 24 hr capsule TAKE 1 CAPSULE EVERY DAY 30 capsule 11   doxazosin (CARDURA) 8 MG tablet TAKE 1 TABLET EVERY DAY 90 tablet 3   Dulaglutide (TRULICITY) 1.5 MG/0.5ML SOPN INJECT 1.5 MG (0.5ML) UNDER THE SKIN ONCE A WEEK 4 mL 3   fluticasone (FLONASE) 50 MCG/ACT nasal spray Place 2 sprays into both nostrils 2 (two) times daily as needed for allergies or rhinitis.     glipiZIDE (GLUCOTROL) 5 MG tablet TAKE 1 TABLET TWICE DAILY BEFORE MEALS (NEED MD APPOINTMENT FOR REFILLS) 60 tablet 1   glucose blood (TRUE METRIX BLOOD GLUCOSE TEST) test strip 1 each by Other route daily. DX: E11.9 100 strip 5   Lancet Devices (PRODIGY LANCING DEVICE) MISC Use bid DX e11.9 1 each 2   lisinopril (ZESTRIL) 40 MG tablet TAKE 1 TABLET EVERY DAY 90 tablet 10   metoprolol succinate (TOPROL-XL) 50 MG 24 hr tablet TAKE 1 TABLET EVERY DAY. TAKE WITH OR IMMEDIATELY FOLLOWING A MEAL (NEED MD APPOINTMENT FOR REFILLS) 30 tablet 11   Multiple Vitamin (MULTIVITAMIN WITH MINERALS) TABS Take 1 tablet by mouth daily.     Omega-3 Fatty Acids (FISH OIL) 1200 MG CAPS Take 1,200 mg by mouth 2 (two) times daily.     pantoprazole (PROTONIX) 20 MG tablet TAKE 1 TABLET EVERY DAY 90 tablet 10   rosuvastatin (CRESTOR) 20 MG tablet TAKE 1 TABLET EVERY DAY 90 tablet 3   TRUEplus Lancets 33G MISC TEST BLOOD SUGAR EVERY DAY 100 each 3   No current facility-administered medications on file prior to visit.   Allergies  Allergen Reactions   Jardiance [Empagliflozin] Nausea And Vomiting    Severe nausea-    Social History   Socioeconomic History   Marital status: Married    Spouse name: Not  on file   Number of children: Not on file   Years of education: Not on file   Highest education level: Not on file  Occupational History   Occupation: retired    Associate Professor: RETIRED    Comment: [employer]   Tobacco Use   Smoking  status: Former    Current packs/day: 0.00    Types: Cigarettes    Start date: 01/01/1953    Quit date: 01/02/1988    Years since quitting: 34.7   Smokeless tobacco: Never  Vaping Use   Vaping status: Never Used  Substance and Sexual Activity   Alcohol use: Not Currently    Comment: rare beer, no history of ETOH abuse   Drug use: No   Sexual activity: Not on file  Other Topics Concern   Not on file  Social History Narrative   Not on file   Social Determinants of Health   Financial Resource Strain: Low Risk  (06/15/2022)   Overall Financial Resource Strain (CARDIA)    Difficulty of Paying Living Expenses: Not very hard  Food Insecurity: Not on file  Transportation Needs: No Transportation Needs (06/15/2022)   PRAPARE - Administrator, Civil Service (Medical): No    Lack of Transportation (Non-Medical): No  Physical Activity: Not on file  Stress: Not on file  Social Connections: Not on file  Intimate Partner Violence: Not on file   Family History  Problem Relation Age of Onset   Diabetes Mother    Heart disease Mother        After age 41   Heart attack Mother    Hypertension Mother    Diabetes Father    Heart disease Father        After age 32   Hypertension Father    Heart attack Father    Brain cancer Sister    Cancer Sister        Brain   Diabetes Sister    Hypertension Sister    Cancer Brother        Chest Tumor   Diabetes Brother    Hypertension Brother    Deep vein thrombosis Sister    Diabetes Sister    Diabetes Brother        Bilateral leg   Colon cancer Neg Hx       Review of Systems  All other systems reviewed and are negative.      Objective:   Physical Exam Vitals reviewed.  Constitutional:      General: He is not in acute distress.    Appearance: He is well-developed. He is not diaphoretic.  HENT:     Head: Normocephalic and atraumatic.  Neck:     Thyroid: No thyromegaly.     Vascular: No JVD.     Trachea: No tracheal  deviation.  Cardiovascular:     Rate and Rhythm: Normal rate and regular rhythm.     Heart sounds: Murmur heard.     No friction rub. No gallop.  Pulmonary:     Effort: Pulmonary effort is normal. No respiratory distress.     Breath sounds: Normal breath sounds. No stridor. No wheezing or rales.  Chest:     Chest wall: No tenderness.  Abdominal:     General: Bowel sounds are normal.     Palpations: Abdomen is soft.  Musculoskeletal:     Right lower leg: Edema present.     Left lower leg: Edema present.  Neurological:     Mental Status:  He is alert and oriented to person, place, and time.     Cranial Nerves: No cranial nerve deficit.     Motor: No abnormal muscle tone.     Coordination: Coordination normal.     Deep Tendon Reflexes: Reflexes are normal and symmetric.  Psychiatric:        Behavior: Behavior normal.        Thought Content: Thought content normal.        Judgment: Judgment normal.           Assessment & Plan:  Diabetes mellitus without complication (HCC) - Plan: HM Diabetes Foot Exam  Coronary artery disease involving native coronary artery of native heart without angina pectoris  Essential hypertension Patient has edema in his lower extremities.  I suspect that he has diastolic dysfunction.  He has not been admitted for heart failure.  We discussed starting a diuretic.  The patient at the present time does not want to start any more pills.  He denies any shortness of breath.  His lungs are clear.  We discussed switching glipizide to Jardiance given his diastolic dysfunction.  He prefers not to do this.  I recommended a complete physical exam in 3 months.  If his weight increases, I would recommend a diuretic at that time and I will consider trying the patient again on Jardiance.

## 2022-09-30 LAB — MICROALBUMIN / CREATININE URINE RATIO
Creatinine, Urine: 331 mg/dL — ABNORMAL HIGH (ref 20–320)
Microalb Creat Ratio: 257 mg/g creat — ABNORMAL HIGH (ref <30)
Microalb, Ur: 85.1 mg/dL

## 2022-10-01 ENCOUNTER — Other Ambulatory Visit: Payer: Self-pay | Admitting: Family Medicine

## 2022-10-03 NOTE — Telephone Encounter (Signed)
Requested Prescriptions  Pending Prescriptions Disp Refills   Dulaglutide (TRULICITY) 1.5 MG/0.5ML SOPN [Pharmacy Med Name: Trulicity Subcutaneous Solution Pen-injector 1.5 MG/0.5ML] 4 mL 2    Sig: INJECT 1.5 MG (0.5ML) UNDER THE SKIN ONCE A WEEK     Endocrinology:  Diabetes - GLP-1 Receptor Agonists Failed - 10/01/2022  8:04 AM      Failed - Valid encounter within last 6 months    Recent Outpatient Visits           1 year ago Diabetes mellitus without complication (HCC)   [facility] Pickard, Priscille Heidelberg, MD   1 year ago Irregular heart beat   [facility] Pickard, Priscille Heidelberg, MD   2 years ago Essential hypertension   [facility] Tanya Nones, Priscille Heidelberg, MD   2 years ago Iron deficiency anemia, unspecified iron deficiency anemia type   [facility] Donita Brooks, MD   2 years ago Shortness of breath   [facility] Pickard, Priscille Heidelberg, MD       Future Appointments             In 1 month Mallipeddi, Orion Modest, MD [facility] [place] [facility]            Passed - HBA1C is between 0 and 7.9 and within 180 days    Hgb A1c MFr Bld  Date Value Ref Range Status  09/26/2022 7.1 (H) <5.7 % of total Hgb Final    Comment:    For someone without known diabetes, a hemoglobin A1c value of 6.5% or greater indicates that they may have  diabetes and this should be confirmed with a follow-up  test. . For someone with known diabetes, a value <7% indicates  that their diabetes is well controlled and a value  greater than or equal to 7% indicates suboptimal  control. A1c targets should be individualized based on  duration of diabetes, age, comorbid conditions, and  other considerations. . Currently, no consensus exists regarding use of hemoglobin A1c for diagnosis of diabetes for children. Marland Kitchen

## 2022-10-19 ENCOUNTER — Ambulatory Visit (INDEPENDENT_AMBULATORY_CARE_PROVIDER_SITE_OTHER)
Admission: RE | Admit: 2022-10-19 | Discharge: 2022-10-19 | Disposition: A | Discharge status: Home / Self Care | Payer: Medicare HMO | Source: Ambulatory Visit | Attending: Vascular Surgery | Admitting: Vascular Surgery

## 2022-10-19 ENCOUNTER — Ambulatory Visit (HOSPITAL_COMMUNITY)
Admission: RE | Admit: 2022-10-19 | Discharge: 2022-10-19 | Disposition: A | Discharge status: Home / Self Care | Payer: Medicare HMO | Source: Ambulatory Visit | Attending: Vascular Surgery | Admitting: Vascular Surgery

## 2022-10-19 ENCOUNTER — Ambulatory Visit (INDEPENDENT_AMBULATORY_CARE_PROVIDER_SITE_OTHER): Payer: Medicare HMO | Admitting: Vascular Surgery

## 2022-10-19 ENCOUNTER — Encounter: Payer: Self-pay | Admitting: Vascular Surgery

## 2022-10-19 VITALS — BP 181/82 | HR 79 | Temp 98.0°F | Resp 20 | Ht 70.0 in | Wt 244.0 lb

## 2022-10-19 DIAGNOSIS — I739 Peripheral vascular disease, unspecified: Secondary | ICD-10-CM | POA: Diagnosis not present

## 2022-10-19 DIAGNOSIS — I7143 Infrarenal abdominal aortic aneurysm, without rupture: Secondary | ICD-10-CM

## 2022-10-19 LAB — VAS US ABI WITH/WO TBI

## 2022-10-19 MED ORDER — CLOPIDOGREL BISULFATE 75 MG PO TABS: 75.0000 mg | ORAL_TABLET | Freq: Every day | ORAL | 0 refills | Status: DC

## 2022-10-19 NOTE — Progress Notes (Signed)
REASON FOR VISIT:   Follow-up after endovascular aneurysm repair  MEDICAL ISSUES:   S/P ENDOVASCULAR ANEURYSM REPAIR: This patient underwent endovascular aneurysm repair of a 5.7 cm infrarenal abdominal aortic aneurysm on 06/13/2022.  On follow-up CT scanning had significant compression of the left iliac limb and on 08/14/2022 had kissing balloons placed.  Today he has excellent femoral pulses normal ABIs and a triphasic signal in the left foot.  Because of his body habitus it was difficult to see the stent but he has an excellent femoral pulse on both sides so I think the stent should be widely patent.  The aneurysm had decreased in size to 5.38 cm.  There was no evidence of endoleak.  I have ordered a follow-up ultrasound in 1 year to follow his aneurysm and also hopefully to see the iliac stents.  He is not a smoker.  He understands that I will be retiring and he will be seen on the PA schedule at that time.  HPI:   Juan [name] is a pleasant 82 y.o. male who underwent endovascular repair of an abdominal aortic aneurysm on 06/13/2022.  The aneurysm was 5.7 cm in maximum diameter.  On a follow-up study he had compression of the left limb at the aortic bifurcation.  On 08/14/2038 was taken to the operating room and underwent kissing balloon angioplasty and stenting of bilateral common iliac arteries with a 10 x 39 and 10 x 29 balloon expandable stent.  He comes in for a follow-up visit.  Since I saw him last, he denies any history of abdominal pain or back pain.  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    50-69%  (12/2015)   Neuromuscular disorder (HCC)    L2-3,L5-S1 bulging disc /nerve impingement   PAD (peripheral artery disease) (HCC)    Sleep apnea     Family History  Problem Relation Age of Onset   Diabetes Mother    Heart disease Mother        After age 72   Heart attack Mother    Hypertension Mother    Diabetes Father    Heart disease Father        After age 27   Hypertension Father    Heart attack Father    Brain cancer Sister    Cancer Sister        Brain   Diabetes Sister    Hypertension Sister    Cancer Brother        Chest Tumor   Diabetes Brother    Hypertension Brother    Deep vein thrombosis Sister    Diabetes Sister    Diabetes Brother        Bilateral leg   Colon cancer Neg Hx     SOCIAL HISTORY: Social History   Tobacco Use   Smoking status: Former    Current packs/day: 0.00    Types: Cigarettes    Start date: 01/01/1953    Quit date: 01/02/1988    Years since quitting: 34.8   Smokeless tobacco: Never  Substance Use Topics   Alcohol use: Not Currently    Comment: rare beer, no history of ETOH abuse    Allergies  Allergen Reactions   Jardiance [Empagliflozin] Nausea And Vomiting    Severe nausea-     Current Outpatient Medications  Medication Sig Dispense Refill   acetaminophen (TYLENOL) 500 MG tablet Take 1,000 mg by mouth every 6 (six) hours as needed for moderate pain.     allopurinol (ZYLOPRIM) 300 MG tablet TAKE 1 TABLET EVERY DAY 90 tablet 10   aspirin EC 81 MG tablet Take 81 mg by mouth daily.     Blood Glucose Monitoring Suppl (TRUE METRIX METER) w/Device KIT Use as Directed 1 kit 1   clopidogrel (PLAVIX) 75 MG tablet Take 1 tablet (75 mg total) by mouth daily. 30 tablet 2   diltiazem (CARDIZEM CD) 240 MG 24 hr capsule TAKE 1 CAPSULE EVERY DAY 30 capsule 11   doxazosin (CARDURA) 8 MG tablet TAKE 1 TABLET EVERY DAY 90 tablet 3   Dulaglutide (TRULICITY) 1.5 MG/0.5ML SOPN INJECT 1.5 MG (0.5ML) UNDER THE SKIN ONCE A WEEK 4 mL 2    fluticasone (FLONASE) 50 MCG/ACT nasal spray Place 2 sprays into both nostrils 2 (two) times daily as needed for allergies or rhinitis.     glipiZIDE (GLUCOTROL) 5 MG tablet TAKE 1 TABLET TWICE DAILY BEFORE MEALS (NEED MD APPOINTMENT FOR REFILLS) 60 tablet 1   glucose blood (TRUE METRIX BLOOD GLUCOSE TEST) test strip 1 each by Other route daily. DX: E11.9 100 strip 5   Lancet Devices (PRODIGY LANCING DEVICE) MISC Use bid DX e11.9 1 each 2   lisinopril (ZESTRIL) 40 MG tablet TAKE 1 TABLET EVERY DAY 90 tablet 10   metoprolol succinate (TOPROL-XL) 50 MG 24 hr tablet TAKE 1 TABLET EVERY DAY. TAKE WITH OR IMMEDIATELY FOLLOWING A MEAL (NEED MD APPOINTMENT FOR REFILLS) 30 tablet 11   Multiple Vitamin (MULTIVITAMIN WITH MINERALS) TABS Take 1 tablet by mouth daily.     Omega-3 Fatty Acids (FISH OIL) 1200 MG CAPS Take 1,200 mg by mouth 2 (two) times daily.     pantoprazole (PROTONIX) 20 MG tablet TAKE 1 TABLET EVERY DAY 90 tablet 10   rosuvastatin (CRESTOR) 20 MG tablet TAKE 1 TABLET EVERY DAY 90 tablet 3   TRUEplus Lancets 33G MISC TEST BLOOD SUGAR EVERY DAY 100 each 3   No current facility-administered medications for this visit.    REVIEW OF SYSTEMS:  [X]  denotes positive finding, [ ]  denotes negative finding Cardiac  Comments:  Chest pain or chest pressure:    Shortness of breath upon exertion:    Short of breath when lying flat:    Irregular heart rhythm:        Vascular    Pain in calf, thigh, or hip brought on by ambulation:    Pain in feet at night that wakes you up from your sleep:     Blood clot in your veins:    Leg swelling:         Pulmonary    Oxygen at home:    Productive cough:     Wheezing:         Neurologic    Sudden weakness in arms or legs:     Sudden numbness in arms or legs:     Sudden onset of difficulty speaking or slurred speech:    Temporary loss of vision in one eye:     Problems with dizziness:  Gastrointestinal    Blood in stool:     Vomited  blood:         Genitourinary    Burning when urinating:     Blood in urine:        Psychiatric    Major depression:         Hematologic    Bleeding problems:    Problems with blood clotting too easily:        Skin    Rashes or ulcers:        Constitutional    Fever or chills:     PHYSICAL EXAM:   Vitals:   10/19/22 0855  BP: (!) 181/82  Pulse: 79  Resp: 20  Temp: 98 F (36.7 C)  SpO2: 98%  Weight: 244 lb (110.7 kg)  Height: 5\' 10"  (1.778 m)    GENERAL: The patient is a well-nourished male, in no acute distress. The vital signs are documented above. CARDIAC: There is a regular rate and rhythm.  VASCULAR: I do not detect carotid bruits. He has palpable femoral pulses. Because of his leg swelling is difficult to palpate his pedal pulses however both feet are warm and well-perfused. PULMONARY: There is good air exchange bilaterally without wheezing or rales. ABDOMEN: Soft and non-tender with normal pitched bowel sounds.  MUSCULOSKELETAL: There are no major deformities or cyanosis. NEUROLOGIC: No focal weakness or paresthesias are detected. SKIN: There are no ulcers or rashes noted. PSYCHIATRIC: The patient has a normal affect.  DATA:    AORTIC DUPLEX: I have independently interpreted his aortic duplex scan today.  The maximum diameter of his aneurysm is 5.38 cm.  This was initially 5.7 cm in maximum diameter.  There is no evidence of endoleak.  They were unable to evaluate the left iliac limb because of his body habitus and the exam was technically difficult.  ARTERIAL DOPPLER STUDY: I have independently interpreted his arterial Doppler study today.  On the right side he has a biphasic posterior tibial signal and a biphasic dorsalis pedis signal.  ABIs 100%.  Toe pressures 182 mmHg.  On the left side he has a triphasic posterior tibial signal and a biphasic dorsalis pedis signal.  ABIs 100%.  Toe pressures 212 mmHg.   [facility]aver[facility] [phone]

## 2022-10-26 ENCOUNTER — Other Ambulatory Visit: Payer: Self-pay

## 2022-10-26 DIAGNOSIS — I7143 Infrarenal abdominal aortic aneurysm, without rupture: Secondary | ICD-10-CM

## 2022-10-26 DIAGNOSIS — Z95828 Presence of other vascular implants and grafts: Secondary | ICD-10-CM

## 2022-11-21 ENCOUNTER — Ambulatory Visit: Payer: Medicare HMO | Admitting: Internal Medicine

## 2022-11-27 ENCOUNTER — Ambulatory Visit: Payer: Medicare HMO | Admitting: Internal Medicine

## 2022-11-27 ENCOUNTER — Ambulatory Visit: Payer: Medicare HMO | Attending: Internal Medicine | Admitting: Internal Medicine

## 2022-11-27 ENCOUNTER — Encounter: Payer: Self-pay | Admitting: Internal Medicine

## 2022-11-27 VITALS — BP 134/62 | HR 73 | Ht 70.0 in | Wt 247.6 lb

## 2022-11-27 DIAGNOSIS — Z9889 Other specified postprocedural states: Secondary | ICD-10-CM

## 2022-11-27 DIAGNOSIS — I7143 Infrarenal abdominal aortic aneurysm, without rupture: Secondary | ICD-10-CM | POA: Diagnosis not present

## 2022-11-27 DIAGNOSIS — Z7902 Long term (current) use of antithrombotics/antiplatelets: Secondary | ICD-10-CM | POA: Diagnosis not present

## 2022-11-27 DIAGNOSIS — Z8679 Personal history of other diseases of the circulatory system: Secondary | ICD-10-CM | POA: Diagnosis not present

## 2022-11-27 MED ORDER — FUROSEMIDE 20 MG PO TABS: 20.0000 mg | ORAL_TABLET | Freq: Every day | ORAL | 0 refills | Status: AC | PRN

## 2022-11-27 NOTE — Patient Instructions (Addendum)
Medication Instructions:  Your physician has recommended you make the following change in your medication:   -Start Lasix 20 mg tablet once daily as needed for shortness of breath and leg swelling. Please call our office when you are due for a refill of this medication.   *If you need a refill on your cardiac medications before your next appointment, please call your pharmacy*   Lab Work: None If you have labs (blood work) drawn today and your tests are completely normal, you will receive your results only by: MyChart Message (if you have MyChart) OR A paper copy in the mail If you have any lab test that is abnormal or we need to change your treatment, we will call you to review the results.   Testing/Procedures: None   Follow-Up: At [facility], you and your health needs are our priority.  As part of our continuing mission to provide you with exceptional heart care, we have created designated Provider Care Teams.  These Care Teams include your primary Cardiologist (physician) and Advanced Practice Providers (APPs -  Physician Assistants and Nurse Practitioners) who all work together to provide you with the care you need, when you need it.  We recommend signing up for the patient portal called "MyChart".  Sign up information is provided on this After Visit Summary.  MyChart is used to connect with patients for Virtual Visits (Telemedicine).  Patients are able to view lab/test results, encounter notes, upcoming appointments, etc.  Non-urgent messages can be sent to your provider as well.   To learn more about what you can do with MyChart, go to [email or web address].    Your next appointment:   6 month(s)  Provider:   You may see Vishnu P Mallipeddi, MD or one of the following Advanced Practice Providers on your designated Care Team:   [place], PA-C  Jacolyn Reedy, [place]     Other Instructions

## 2022-12-01 DIAGNOSIS — Z7902 Long term (current) use of antithrombotics/antiplatelets: Secondary | ICD-10-CM | POA: Insufficient documentation

## 2022-12-01 DIAGNOSIS — Z9889 Other specified postprocedural states: Secondary | ICD-10-CM | POA: Insufficient documentation

## 2022-12-01 NOTE — Progress Notes (Signed)
Cardiology Office Note  Date: 12/01/2022   ID: Juan [name], DOB 1941-01-21, MRN [id number]  PCP:  Donita Brooks, MD  Cardiologist:  Marjo Bicker, MD Electrophysiologist:  None   History of Present Illness: Juan [name] is a 82 y.o. male known to have CAD s/p angioplasty in 1999, PAD s/p kissing balloon angioplasty and stenting of bilateral common iliac arteries in 6/24, AAA s/p EVAR in 4/24, OSA, HTN, DM 2, CKD stage III is here for follow-up visit.  No angina or claudication.  Has chronic DOE, no orthopnea or PND.  No leg swelling, palpitations, syncope, dizziness.  He does not eat right.  Compliant with medications.  No side effects.  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    50-69% (12/2015)   Neuromuscular disorder (HCC)    L2-3,L5-S1 bulging disc /nerve impingement   PAD (peripheral artery disease) (HCC)    Sleep apnea     Past Surgical History:  Procedure Laterality Date   ABDOMINAL AORTIC ENDOVASCULAR STENT GRAFT Bilateral 06/13/2022   Procedure: ABDOMINAL AORTIC ENDOVASCULAR STENT GRAFT;  Surgeon: Chuck Hint, MD;  Location: [facility] OR;  Service: Vascular;  Laterality: Bilateral;   ABDOMINAL AORTOGRAM W/LOWER EXTREMITY N/A 08/14/2022   Procedure: ABDOMINAL AORTOGRAM W/LOWER EXTREMITY;  Surgeon: Chuck Hint, MD;  Location: [facility] INVASIVE CV LAB;  Service: Cardiovascular;  Laterality: N/A;   CATARACT EXTRACTION W/ INTRAOCULAR LENS IMPLANT Bilateral    about 3 years ago   COLONOSCOPY N/A 09/27/2012   Procedure: COLONOSCOPY;  Surgeon: Corbin Ade, MD;  [facility];  Service: Endoscopy;  Laterality: N/A;  9:30   CORONARY ANGIOPLASTY     1989   PERIPHERAL VASCULAR INTERVENTION Bilateral 08/14/2022   Procedure: PERIPHERAL VASCULAR INTERVENTION;  Surgeon: Chuck Hint, MD;  Location: [facility] INVASIVE CV LAB;  Service: Cardiovascular;  Laterality: Bilateral;  Iliac   TONSILLECTOMY     ULTRASOUND GUIDANCE FOR VASCULAR ACCESS Bilateral 06/13/2022   Procedure: ULTRASOUND GUIDANCE FOR VASCULAR ACCESS;  Surgeon: Chuck Hint, MD;  Location: [facility] OR;  Service: Vascular;  Laterality: Bilateral;    Current Outpatient Medications  Medication Sig Dispense Refill   acetaminophen (TYLENOL) 500 MG tablet Take 1,000 mg by mouth every 6 (six) hours as needed for moderate pain.     allopurinol (ZYLOPRIM) 300 MG tablet TAKE 1 TABLET EVERY DAY 90 tablet 10   aspirin EC 81 MG tablet Take 81 mg by mouth daily.     Blood Glucose Monitoring Suppl (TRUE METRIX METER) w/Device KIT Use as Directed 1 kit 1   clopidogrel (PLAVIX) 75 MG tablet Take 1 tablet (75 mg total) by mouth daily. 30 tablet 2   clopidogrel (PLAVIX) 75 MG tablet Take 1 tablet (75 mg total) by mouth daily. 30 tablet 0   diltiazem (CARDIZEM CD) 240 MG 24 hr capsule TAKE 1 CAPSULE EVERY DAY 30 capsule 11   doxazosin (CARDURA) 8 MG tablet TAKE 1 TABLET EVERY DAY 90 tablet 3   Dulaglutide (TRULICITY) 1.5 MG/0.5ML SOPN INJECT 1.5 MG (0.5ML) UNDER THE  SKIN ONCE A WEEK 4 mL 2   fluticasone (FLONASE) 50 MCG/ACT nasal spray Place 2 sprays into both nostrils 2 (two) times daily as needed for allergies or rhinitis.     furosemide (LASIX) 20 MG tablet Take 1 tablet (20 mg total) by mouth daily as needed for edema (SOB). 30 tablet 0   glipiZIDE (GLUCOTROL) 5 MG tablet TAKE 1 TABLET TWICE DAILY BEFORE MEALS (NEED MD APPOINTMENT FOR REFILLS) 60 tablet 1   glucose blood (TRUE METRIX BLOOD GLUCOSE TEST) test strip 1 each by Other route daily. DX: E11.9 100 strip 5   Lancet Devices (PRODIGY LANCING DEVICE)  MISC Use bid DX e11.9 1 each 2   lisinopril (ZESTRIL) 40 MG tablet TAKE 1 TABLET EVERY DAY 90 tablet 10   metoprolol succinate (TOPROL-XL) 50 MG 24 hr tablet TAKE 1 TABLET EVERY DAY. TAKE WITH OR IMMEDIATELY FOLLOWING A MEAL (NEED MD APPOINTMENT FOR REFILLS) 30 tablet 11   Multiple Vitamin (MULTIVITAMIN WITH MINERALS) TABS Take 1 tablet by mouth daily.     Omega-3 Fatty Acids (FISH OIL) 1200 MG CAPS Take 1,200 mg by mouth 2 (two) times daily.     pantoprazole (PROTONIX) 20 MG tablet TAKE 1 TABLET EVERY DAY 90 tablet 10   rosuvastatin (CRESTOR) 20 MG tablet TAKE 1 TABLET EVERY DAY 90 tablet 3   TRUEplus Lancets 33G MISC TEST BLOOD SUGAR EVERY DAY 100 each 3   No current facility-administered medications for this visit.   Allergies:  Jardiance [empagliflozin]   Social History: The patient  reports that he quit smoking about 34 years ago. His smoking use included cigarettes. He started smoking about 69 years ago. He has never used smokeless tobacco. He reports that he does not currently use alcohol. He reports that he does not use drugs.   Family History: The patient's family history includes Brain cancer in his sister; Cancer in his brother and sister; Deep vein thrombosis in his sister; Diabetes in his brother, brother, father, mother, sister, and sister; Heart attack in his father and mother; Heart disease in his father and mother; Hypertension in his brother, father, mother, and sister.   ROS:  Please see the history of present illness. Otherwise, complete review of systems is positive for none.  All other systems are reviewed and negative.   Physical Exam: VS:  BP 134/62 (BP Location: Left Arm, Patient Position: Sitting, Cuff Size: Normal)   Pulse 73   Ht 5\' 10"  (1.778 m)   Wt 247 lb 9.6 oz (112.3 kg)   SpO2 97%   BMI 35.53 kg/m , BMI Body mass index is 35.53 kg/m.  Wt Readings from Last 3 Encounters:  11/27/22 247 lb 9.6 oz (112.3 kg)  10/19/22 244 lb (110.7 kg)  09/29/22 247 lb  (112 kg)    General: Patient appears comfortable at rest. HEENT: Conjunctiva and lids normal, oropharynx clear with moist mucosa. Neck: Supple, no elevated JVP or carotid bruits, no thyromegaly. Lungs: Clear to auscultation, nonlabored breathing at rest. Cardiac: Regular rate and rhythm, no S3 or significant systolic murmur, no pericardial rub. Abdomen: Soft, nontender, no hepatomegaly, bowel sounds present, no guarding or rebound. Extremities: No pitting edema, distal pulses 2+. Skin: Warm and dry. Musculoskeletal: No kyphosis. Neuropsychiatric: Alert and oriented x3, affect grossly appropriate.  Recent Labwork: 09/26/2022: ALT 16; AST 16; BUN 21; Creat 1.49; Hemoglobin 10.6; Platelets 106; Potassium 3.8; Sodium 142     Component Value Date/Time   CHOL 127 09/26/2022 1118   TRIG 171 (  H) 09/26/2022 1118   HDL 35 (L) 09/26/2022 1118   CHOLHDL 3.6 09/26/2022 1118   VLDL 35 (H) 08/28/2016 0845   LDLCALC 67 09/26/2022 1118     Assessment and Plan:  CAD s/p angioplasty in 1989 PAD s/p kissing angioplasty and stenting of bilateral common iliac arteries in 08/2022 AAA 5.7 cm s/p EVAR in 06/2022 HTN, controlled HLD, partially at goal  -No interval angina but has DOE which is chronic denies having any orthopnea or PND, will start p.o. Lasix 20 mg as needed for SOB/LE swelling. Continue cardioprotective medications, DAPT and rosuvastatin 20 mg nightly.  Goal LDL should be less than 55.  I reviewed the lipid panel from 7/24 that showed LDL 67 and triglycerides 171 mildly elevated.  Strongly encouraged heart healthy diet which the patient is not compliant with.  Will repeat lipid panel in the next clinic visit.  On DAPT, per vascular surgery. Continue metoprolol succinate 50 mg once daily, diltiazem 240 mg once daily, lisinopril 40 mg once daily for HTN.    Medication Adjustments/Labs and Tests Ordered: Current medicines are reviewed at length with the patient today.  Concerns regarding  medicines are outlined above.    Disposition:  Follow up   Signed, Cleophas Yoak Verne Spurr, MD, 12/01/2022 7:51 PM    [facility] at [facility] [address] [place], [place] [address]

## 2022-12-02 ENCOUNTER — Other Ambulatory Visit: Payer: Self-pay | Admitting: Family Medicine

## 2022-12-05 NOTE — Telephone Encounter (Signed)
Requested Prescriptions  Pending Prescriptions Disp Refills   glipiZIDE (GLUCOTROL) 5 MG tablet [Pharmacy Med Name: glipiZIDE Oral Tablet 5 MG] 180 tablet 1    Sig: TAKE 1 TABLET TWICE DAILY BEFORE MEALS (NEED MD APPOINTMENT FOR REFILLS)     Endocrinology:  Diabetes - Sulfonylureas Failed - 12/02/2022  1:40 PM      Failed - Cr in normal range and within 360 days    Creat  Date Value Ref Range Status  09/26/2022 1.49 (H) 0.70 - 1.22 mg/dL Final   Creatinine, Urine  Date Value Ref Range Status  09/29/2022 331 (H) 20 - 320 mg/dL Final    Comment:    Verified by repeat analysis. .          Failed - Valid encounter within last 6 months    Recent Outpatient Visits           1 year ago Diabetes mellitus without complication (HCC)   [facility] Pickard, Priscille Heidelberg, MD   1 year ago Irregular heart beat   [facility] Pickard, Priscille Heidelberg, MD   2 years ago Essential hypertension   [facility] Tanya Nones, Priscille Heidelberg, MD   3 years ago Iron deficiency anemia, unspecified iron deficiency anemia type   [facility] Donita Brooks, MD   3 years ago Shortness of breath   [facility] Pickard, Priscille Heidelberg, MD       Future Appointments             In 5 months Mallipeddi, Orion Modest, MD [facility] at [facility] [place] [facility]            Passed - HBA1C is between 0 and 7.9 and within 180 days    Hgb A1c MFr Bld  Date Value Ref Range Status  09/26/2022 7.1 (H) <5.7 % of total Hgb Final    Comment:    For someone without known diabetes, a hemoglobin A1c value of 6.5% or greater indicates that they may have  diabetes and this should be confirmed with a follow-up  test. . For someone with known diabetes, a value <7% indicates  that their diabetes is well controlled and a value  greater than or equal to 7% indicates suboptimal  control. A1c targets should be individualized based on  duration of diabetes,  age, comorbid conditions, and  other considerations. . Currently, no consensus exists regarding use of hemoglobin A1c for diagnosis of diabetes for children. Marland Kitchen

## 2023-01-11 ENCOUNTER — Ambulatory Visit (INDEPENDENT_AMBULATORY_CARE_PROVIDER_SITE_OTHER): Payer: Medicare HMO

## 2023-01-11 VITALS — Ht 70.0 in | Wt 247.0 lb

## 2023-01-11 DIAGNOSIS — Z0000 Encounter for general adult medical examination without abnormal findings: Secondary | ICD-10-CM | POA: Diagnosis not present

## 2023-01-11 NOTE — Progress Notes (Signed)
Subjective:   Juan [name] is a 82 y.o. male who presents for Medicare Annual/Subsequent preventive examination.  Visit Complete: Virtual I connected with  Juan [name] on 01/11/23 by a audio enabled telemedicine application and verified that I am speaking with the correct person using two identifiers.  Patient Location: Home  Provider Location: Home Office  I discussed the limitations of evaluation and management by telemedicine. The patient expressed understanding and agreed to proceed.  Vital Signs: Because this visit was a virtual/telehealth visit, some criteria may be missing or patient reported. Any vitals not documented were not able to be obtained and vitals that have been documented are patient reported.  Cardiac Risk Factors include: advanced age (>32men, >50 women);diabetes mellitus;hypertension;male gender;dyslipidemia     Objective:    Today's Vitals   01/11/23 1357  Weight: 247 lb (112 kg)  Height: 5\' 10"  (1.778 m)   Body mass index is 35.44 kg/m.     01/11/2023    2:03 PM 08/14/2022    6:07 AM 06/05/2022    1:13 PM 12/13/2020    2:58 PM 09/17/2020    2:22 PM 08/02/2017    9:40 AM 01/19/2016   10:09 AM  Advanced Directives  Does Patient Have a Medical Advance Directive? No Yes Yes No Yes No No  Type of Special educational needs teacher of [place];Living will Living will  Healthcare Power of [place];Living will    Does patient want to make changes to medical advance directive?  No - Patient declined   No - Patient declined    Copy of Healthcare Power of Attorney in Chart?  No - copy requested No - copy requested  No - copy requested    Would patient like information on creating a medical advance directive? Yes (MAU/Ambulatory/Procedural Areas - Information given)   No - Patient declined  No - Patient declined No - patient declined information    Current Medications (verified) Outpatient Encounter Medications as of 01/11/2023  Medication Sig    acetaminophen (TYLENOL) 500 MG tablet Take 1,000 mg by mouth every 6 (six) hours as needed for moderate pain.   allopurinol (ZYLOPRIM) 300 MG tablet TAKE 1 TABLET EVERY DAY   aspirin EC 81 MG tablet Take 81 mg by mouth daily.   Blood Glucose Monitoring Suppl (TRUE METRIX METER) w/Device KIT Use as Directed   clopidogrel (PLAVIX) 75 MG tablet Take 1 tablet (75 mg total) by mouth daily.   clopidogrel (PLAVIX) 75 MG tablet Take 1 tablet (75 mg total) by mouth daily.   diltiazem (CARDIZEM CD) 240 MG 24 hr capsule TAKE 1 CAPSULE EVERY DAY   doxazosin (CARDURA) 8 MG tablet TAKE 1 TABLET EVERY DAY   Dulaglutide (TRULICITY) 1.5 MG/0.5ML SOPN INJECT 1.5 MG (0.5ML) UNDER THE SKIN ONCE A WEEK   fluticasone (FLONASE) 50 MCG/ACT nasal spray Place 2 sprays into both nostrils 2 (two) times daily as needed for allergies or rhinitis.   furosemide (LASIX) 20 MG tablet Take 1 tablet (20 mg total) by mouth daily as needed for edema (SOB).   glipiZIDE (GLUCOTROL) 5 MG tablet TAKE 1 TABLET TWICE DAILY BEFORE MEALS (NEED MD APPOINTMENT FOR REFILLS)   glucose blood (TRUE METRIX BLOOD GLUCOSE TEST) test strip 1 each by Other route daily. DX: E11.9   Lancet Devices (PRODIGY LANCING DEVICE) MISC Use bid DX e11.9   lisinopril (ZESTRIL) 40 MG tablet TAKE 1 TABLET EVERY DAY   metoprolol succinate (TOPROL-XL) 50 MG 24 hr tablet TAKE 1 TABLET EVERY DAY.  TAKE WITH OR IMMEDIATELY FOLLOWING A MEAL (NEED MD APPOINTMENT FOR REFILLS)   Multiple Vitamin (MULTIVITAMIN WITH MINERALS) TABS Take 1 tablet by mouth daily.   Omega-3 Fatty Acids (FISH OIL) 1200 MG CAPS Take 1,200 mg by mouth 2 (two) times daily.   pantoprazole (PROTONIX) 20 MG tablet TAKE 1 TABLET EVERY DAY   rosuvastatin (CRESTOR) 20 MG tablet TAKE 1 TABLET EVERY DAY   TRUEplus Lancets 33G MISC TEST BLOOD SUGAR EVERY DAY   No facility-administered encounter medications on file as of 01/11/2023.    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    50-69% (12/2015)   Neuromuscular disorder (HCC)    L2-3,L5-S1 bulging disc /nerve impingement   PAD (peripheral artery disease) (HCC)    Sleep apnea    Past Surgical History:  Procedure Laterality Date   ABDOMINAL AORTIC ENDOVASCULAR STENT GRAFT Bilateral 06/13/2022   Procedure: ABDOMINAL AORTIC ENDOVASCULAR STENT GRAFT;  Surgeon: Chuck Hint, MD;  Location: [facility] OR;  Service: Vascular;  Laterality: Bilateral;   ABDOMINAL AORTOGRAM W/LOWER EXTREMITY N/A 08/14/2022   Procedure: ABDOMINAL AORTOGRAM W/LOWER EXTREMITY;  Surgeon: Chuck Hint, MD;  Location: [facility] INVASIVE CV LAB;  Service: Cardiovascular;  Laterality: N/A;   CATARACT EXTRACTION W/ INTRAOCULAR LENS IMPLANT Bilateral    about 3 years ago   COLONOSCOPY N/A 09/27/2012   Procedure: COLONOSCOPY;  Surgeon: Corbin Ade, MD;  Location[facility];  Service: Endoscopy;  Laterality: N/A;  9:30   CORONARY ANGIOPLASTY     1989   PERIPHERAL VASCULAR INTERVENTION Bilateral 08/14/2022   Procedure: PERIPHERAL VASCULAR INTERVENTION;  Surgeon: Chuck Hint, MD;  Location: [facility] INVASIVE CV LAB;  Service: Cardiovascular;  Laterality: Bilateral;  Iliac   TONSILLECTOMY     ULTRASOUND GUIDANCE FOR VASCULAR ACCESS Bilateral 06/13/2022   Procedure: ULTRASOUND GUIDANCE FOR VASCULAR ACCESS;  Surgeon: Chuck Hint, MD;  Location[facility];  Service: Vascular;  Laterality: Bilateral;   Family History  Problem Relation Age of Onset   Diabetes Mother    Heart disease Mother        After age 74   Heart  attack Mother    Hypertension Mother    Diabetes Father    Heart disease Father        After age 66   Hypertension Father    Heart attack Father    Brain cancer Sister    Cancer Sister        Brain   Diabetes Sister    Hypertension Sister    Cancer Brother        Chest Tumor   Diabetes Brother    Hypertension Brother    Deep vein thrombosis Sister    Diabetes Sister    Diabetes Brother        Bilateral leg   Colon cancer Neg Hx    Social History   Socioeconomic History   Marital status: Married    Spouse name: Not on file   Number of children: Not on file  Years of education: Not on file   Highest education level: Not on file  Occupational History   Occupation: retired    Associate Professor: RETIRED    Comment: [employer]   Tobacco Use   Smoking status: Former    Current packs/day: 0.00    Types: Cigarettes    Start date: 01/01/1953    Quit date: 01/02/1988    Years since quitting: 35.0   Smokeless tobacco: Never  Vaping Use   Vaping status: Never Used  Substance and Sexual Activity   Alcohol use: Not Currently    Comment: rare beer, no history of ETOH abuse   Drug use: No   Sexual activity: Not on file  Other Topics Concern   Not on file  Social History Narrative   Not on file   Social Determinants of Health   Financial Resource Strain: Low Risk  (01/11/2023)   Overall Financial Resource Strain (CARDIA)    Difficulty of Paying Living Expenses: Not hard at all  Food Insecurity: No Food Insecurity (01/11/2023)   Hunger Vital Sign    Worried About Running Out of Food in the Last Year: Never true    Ran Out of Food in the Last Year: Never true  Transportation Needs: No Transportation Needs (01/11/2023)   PRAPARE - Administrator, Civil Service (Medical): No    Lack of Transportation (Non-Medical): No  Physical Activity: Inactive (01/11/2023)   Exercise Vital Sign    Days of Exercise per Week: 0 days    Minutes of Exercise per Session: 0 min   Stress: No Stress Concern Present (01/11/2023)   [employer] of Occupational Health - Occupational Stress Questionnaire    Feeling of Stress : Not at all  Social Connections: Socially Integrated (01/11/2023)   Social Connection and Isolation Panel [NHANES]    Frequency of Communication with Friends and Family: More than three times a week    Frequency of Social Gatherings with Friends and Family: Three times a week    Attends Religious Services: More than 4 times per year    Active Member of Clubs or Organizations: Yes    Attends Engineer, structural: More than 4 times per year    Marital Status: Married    Tobacco Counseling Counseling given: Not Answered   Clinical Intake:  Pre-visit preparation completed: Yes  Pain : No/denies pain     Diabetes: Yes CBG done?: No Did pt. bring in CBG monitor from home?: No  How often do you need to have someone help you when you read instructions, pamphlets, or other written materials from your doctor or pharmacy?: 1 - Never  Interpreter Needed?: No  Information entered by :: Kandis Fantasia LPN   Activities of Daily Living    01/11/2023    2:01 PM 06/05/2022    1:16 PM  In your present state of health, do you have any difficulty performing the following activities:  Hearing? 0   Vision? 0   Difficulty concentrating or making decisions? 0   Walking or climbing stairs? 0   Dressing or bathing? 0   Doing errands, shopping? 0 0  Preparing Food and eating ? N   Using the Toilet? N   In the past six months, have you accidently leaked urine? N   Do you have problems with loss of bowel control? N   Managing your Medications? N   Managing your Finances? N   Housekeeping or managing your Housekeeping? N     Patient  Care Team: Donita Brooks, MD as PCP - General (Family Medicine) Mallipeddi, Orion Modest, MD as PCP - Cardiology (Cardiology) Jena Gauss Gerrit Friends, MD as Attending Physician (Gastroenterology)  Indicate any  recent Medical Services you may have received from other than [facility] providers in the past year (date may be approximate).     Assessment:   This is a routine wellness examination for Mavric.  Hearing/Vision screen Hearing Screening - Comments:: Denies hearing difficulties   Vision Screening - Comments:: No vision problems; will schedule routine eye exam soon     Goals Addressed             This Visit's Progress    COMPLETED: Track and Manage My Blood Pressure-Hypertension       Timeframe:  Long-Range Goal Priority:  High Start Date:           09/17/2020                  Expected End Date:   03/20/2021                    Follow Up Date- 03/11/2021   - continue to check blood pressure daily and record in log, take log to doctor's appointments - follow a low salt diet- avoid salty snacks and fast food read labels - try to exercise daily, continue going to [employer]- keep up the good work - take all medications as prescribed - follow up with Dr. Edilia Bo on 10/20   Why is this important?   You won't feel high blood pressure, but it can still hurt your blood vessels.  High blood pressure can cause heart or kidney problems. It can also cause a stroke.  Making lifestyle changes like losing a little weight or eating less salt will help.  Checking your blood pressure at home and at different times of the day can help to control blood pressure.  If the doctor prescribes medicine remember to take it the way the doctor ordered.  Call the office if you cannot afford the medicine or if there are questions about it.     Notes:       Depression Screen    01/11/2023    2:02 PM 12/15/2021   10:29 AM 12/13/2020    2:57 PM 09/17/2020    2:23 PM 09/17/2020    2:22 PM 05/18/2020    8:53 AM 12/04/2019   10:27 AM  PHQ 2/9 Scores  PHQ - 2 Score 0 0 0 0 0 0 0    Fall Risk    01/11/2023    2:03 PM 12/15/2021   10:29 AM 12/13/2020    2:57 PM 09/17/2020    2:10 PM 05/25/2020    1:53 PM  Fall Risk    Falls in the past year? 0 0 0 0 0  Number falls in past yr: 0 0 0    Injury with Fall? 0 0 0    Risk for fall due to : No Fall Risks No Fall Risks No Fall Risks    Follow up Falls prevention discussed;Education provided;Falls evaluation completed Falls prevention discussed Falls evaluation completed      MEDICARE RISK AT HOME: Medicare Risk at Home Any stairs in or around the home?: No If so, are there any without handrails?: No Home free of loose throw rugs in walkways, pet beds, electrical cords, etc?: Yes Adequate lighting in your home to reduce risk of falls?: Yes Life alert?: No Use of a  cane, walker or w/c?: No Grab bars in the bathroom?: Yes Shower chair or bench in shower?: No Elevated toilet seat or a handicapped toilet?: Yes  TIMED UP AND GO:  Was the test performed?  No    Cognitive Function:        01/11/2023    2:03 PM  6CIT Screen  What Year? 0 points  What month? 0 points  What time? 0 points  Count back from 20 0 points  Months in reverse 0 points  Repeat phrase 0 points  Total Score 0 points    Immunizations Immunization History  Administered Date(s) Administered   Fluad Quad(high Dose 65+) 11/14/2018, 01/06/2020, 12/24/2020   Influenza Split 11/16/2009   Influenza,inj,Quad PF,6+ Mos 12/24/2012, 11/20/2013, 12/03/2014, 11/26/2015, 12/14/2016, 11/22/2017   PFIZER(Purple Top)SARS-COV-2 Vaccination 05/08/2019, 06/03/2019   Pfizer Covid-19 Vaccine Bivalent Booster 33yrs & up 03/02/2021   Pneumococcal Conjugate-13 02/18/2013   Pneumococcal Polysaccharide-23 03/14/2007   Zoster Recombinant(Shingrix) 01/13/2021, 03/24/2021   Zoster, Live 08/27/2012    TDAP status: Due, Education has been provided regarding the importance of this vaccine. Advised may receive this vaccine at local pharmacy or Health Dept. Aware to provide a copy of the vaccination record if obtained from local pharmacy or Health Dept. Verbalized acceptance and understanding.  Flu  Vaccine status: Up to date  Pneumococcal vaccine status: Up to date  Covid-19 vaccine status: Information provided on how to obtain vaccines.   Qualifies for Shingles Vaccine? Yes   Zostavax completed Yes   Shingrix Completed?: Yes  Screening Tests Health Maintenance  Topic Date Due   DTaP/Tdap/Td (1 - Tdap) Never done   INFLUENZA VACCINE  10/12/2022   OPHTHALMOLOGY EXAM  11/02/2022   COVID-19 Vaccine (4 - 2023-24 season) 11/12/2022   HEMOGLOBIN A1C  03/29/2023   Diabetic kidney evaluation - eGFR measurement  09/26/2023   Diabetic kidney evaluation - Urine ACR  09/29/2023   FOOT EXAM  09/29/2023   Medicare Annual Wellness (AWV)  01/11/2024   Pneumonia Vaccine 23+ Years old  Completed   Zoster Vaccines- Shingrix  Completed   HPV VACCINES  Aged Out    Health Maintenance  Health Maintenance Due  Topic Date Due   DTaP/Tdap/Td (1 - Tdap) Never done   INFLUENZA VACCINE  10/12/2022   OPHTHALMOLOGY EXAM  11/02/2022   COVID-19 Vaccine (4 - 2023-24 season) 11/12/2022    Colorectal cancer screening: No longer required.   Lung Cancer Screening: (Low Dose CT Chest recommended if Age 31-80 years, 20 pack-year currently smoking OR have quit w/in 15years.) does not qualify.   Lung Cancer Screening Referral: n/a  Additional Screening:  Hepatitis C Screening: does not qualify  Vision Screening: Recommended annual ophthalmology exams for early detection of glaucoma and other disorders of the eye. Is the patient up to date with their annual eye exam?  No  Who is the provider or what is the name of the office in which the patient attends annual eye exams? none If pt is not established with a provider, would they like to be referred to a provider to establish care? No .   Dental Screening: Recommended annual dental exams for proper oral hygiene  Diabetic Foot Exam: Diabetic Foot Exam: Completed 09/29/22  Community Resource Referral / Chronic Care Management: CRR required this visit?   No   CCM required this visit?  No     Plan:     I have personally reviewed and noted the following in the patient's chart:   Medical  and social history Use of alcohol, tobacco or illicit drugs  Current medications and supplements including opioid prescriptions. Patient is not currently taking opioid prescriptions. Functional ability and status Nutritional status Physical activity Advanced directives List of other physicians Hospitalizations, surgeries, and ER visits in previous 12 months Vitals Screenings to include cognitive, depression, and falls Referrals and appointments  In addition, I have reviewed and discussed with patient certain preventive protocols, quality metrics, and best practice recommendations. A written personalized care plan for preventive services as well as general preventive health recommendations were provided to patient.     Kandis Fantasia [place], [place]   16/12/9602   After Visit Summary: (MyChart) Due to this being a telephonic visit, the after visit summary with patients personalized plan was offered to patient via MyChart   Nurse Notes: No concerns at this time

## 2023-01-11 NOTE — Patient Instructions (Signed)
Juan [name] , Thank you for taking time to come for your Medicare Wellness Visit. I appreciate your ongoing commitment to your health goals. Please review the following plan we discussed and let me know if I can assist you in the future.   Referrals/Orders/Follow-Ups/Clinician Recommendations: Aim for 30 minutes of exercise or brisk walking, 6-8 glasses of water, and 5 servings of fruits and vegetables each day.  This is a list of the screening recommended for you and due dates:  Health Maintenance  Topic Date Due   DTaP/Tdap/Td vaccine (1 - Tdap) Never done   Flu Shot  10/12/2022   Eye exam for diabetics  11/02/2022   COVID-19 Vaccine (4 - 2023-24 season) 11/12/2022   Hemoglobin A1C  03/29/2023   Yearly kidney function blood test for diabetes  09/26/2023   Yearly kidney health urinalysis for diabetes  09/29/2023   Complete foot exam   09/29/2023   Medicare Annual Wellness Visit  01/11/2024   Pneumonia Vaccine  Completed   Zoster (Shingles) Vaccine  Completed   HPV Vaccine  Aged Out    Advanced directives: (ACP Link)Information on Advanced Care Planning can be found at [facility] [place] Advance Health Care Directives Advance Health Care Directives ([email or web address]   Next Medicare Annual Wellness Visit scheduled for next year: Yes

## 2023-02-19 ENCOUNTER — Other Ambulatory Visit: Payer: Self-pay | Admitting: Family Medicine

## 2023-02-26 ENCOUNTER — Other Ambulatory Visit: Payer: Medicare HMO

## 2023-02-26 DIAGNOSIS — N1832 Chronic kidney disease, stage 3b: Secondary | ICD-10-CM

## 2023-02-26 DIAGNOSIS — E119 Type 2 diabetes mellitus without complications: Secondary | ICD-10-CM

## 2023-02-26 DIAGNOSIS — I2510 Atherosclerotic heart disease of native coronary artery without angina pectoris: Secondary | ICD-10-CM | POA: Diagnosis not present

## 2023-02-26 DIAGNOSIS — I10 Essential (primary) hypertension: Secondary | ICD-10-CM | POA: Diagnosis not present

## 2023-03-01 ENCOUNTER — Ambulatory Visit: Payer: Medicare HMO | Admitting: Family Medicine

## 2023-03-01 ENCOUNTER — Encounter: Payer: Self-pay | Admitting: Family Medicine

## 2023-03-01 DIAGNOSIS — E1169 Type 2 diabetes mellitus with other specified complication: Secondary | ICD-10-CM

## 2023-03-01 DIAGNOSIS — Z7984 Long term (current) use of oral hypoglycemic drugs: Secondary | ICD-10-CM

## 2023-03-01 DIAGNOSIS — N1832 Chronic kidney disease, stage 3b: Secondary | ICD-10-CM

## 2023-03-01 DIAGNOSIS — E119 Type 2 diabetes mellitus without complications: Secondary | ICD-10-CM

## 2023-03-01 DIAGNOSIS — I10 Essential (primary) hypertension: Secondary | ICD-10-CM | POA: Diagnosis not present

## 2023-03-01 DIAGNOSIS — I2510 Atherosclerotic heart disease of native coronary artery without angina pectoris: Secondary | ICD-10-CM

## 2023-03-01 NOTE — Progress Notes (Signed)
Subjective:    Patient ID: Juan [name], male    DOB: Oct 20, 1940, 82 y.o.   MRN: [id number]  At his last visit, my plan was:  Patient has edema in his lower extremities.  I suspect that he has diastolic dysfunction.  He has not been admitted for heart failure.  We discussed starting a diuretic.  The patient at the present time does not want to start any more pills.  He denies any shortness of breath.  His lungs are clear.  We discussed switching glipizide to Jardiance given his diastolic dysfunction.  He prefers not to do this.  I recommended a complete physical exam in 3 months.  If his weight increases, I would recommend a diuretic at that time and I will consider trying the patient again on Jardiance.  03/01/23  Wt Readings from Last 3 Encounters:  03/01/23 245 lb 3.2 oz (111.2 kg)  01/11/23 247 lb (112 kg)  11/27/22 247 lb 9.6 oz (112.3 kg)    Patient is here today for follow-up.  He has trace pitting edema in both ankles.  Recent lab work that showed an elevated protein to creatinine ratio worsening creatinine.  Hemoglobin A1c is still pending.  Blood pressure is acceptable at 140/70.  Patient is already on maximum dose lisinopril.  He denies any chest pain.  He denies any shortness of breath.  He denies any dyspnea on exertion.  . 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    50-69% (12/2015)   Neuromuscular disorder (HCC)    L2-3,L5-S1 bulging disc /nerve impingement   PAD (peripheral artery disease) (HCC)    Sleep apnea    Past Surgical History:  Procedure Laterality Date   ABDOMINAL AORTIC  ENDOVASCULAR STENT GRAFT Bilateral 06/13/2022   Procedure: ABDOMINAL AORTIC ENDOVASCULAR STENT GRAFT;  Surgeon: Chuck Hint, MD;  Location: [facility] OR;  Service: Vascular;  Laterality: Bilateral;   ABDOMINAL AORTOGRAM W/LOWER EXTREMITY N/A 08/14/2022   Procedure: ABDOMINAL AORTOGRAM W/LOWER EXTREMITY;  Surgeon: Chuck Hint, MD;  Location: [facility] INVASIVE CV LAB;  Service: Cardiovascular;  Laterality: N/A;   CATARACT EXTRACTION W/ INTRAOCULAR LENS IMPLANT Bilateral    about 3 years ago   COLONOSCOPY N/A 09/27/2012   Procedure: COLONOSCOPY;  Surgeon: Corbin Ade, MD;  [facility];  Service: Endoscopy;  Laterality: N/A;  9:30   CORONARY ANGIOPLASTY     1989   PERIPHERAL VASCULAR INTERVENTION Bilateral 08/14/2022   Procedure: PERIPHERAL VASCULAR INTERVENTION;  Surgeon: Chuck Hint, MD;  Location: [facility] INVASIVE CV LAB;  Service: Cardiovascular;  Laterality: Bilateral;  Iliac   TONSILLECTOMY     ULTRASOUND GUIDANCE FOR VASCULAR ACCESS Bilateral 06/13/2022   Procedure: ULTRASOUND GUIDANCE FOR VASCULAR ACCESS;  Surgeon: Chuck Hint, MD;  Location: [facility] OR;  Service: Vascular;  Laterality: Bilateral;   Current Outpatient Medications on File Prior to Visit  Medication Sig Dispense Refill   acetaminophen (TYLENOL) 500 MG tablet Take 1,000 mg by mouth every 6 (six) hours as needed for moderate pain.     allopurinol (ZYLOPRIM)  300 MG tablet TAKE 1 TABLET EVERY DAY 90 tablet 10   aspirin EC 81 MG tablet Take 81 mg by mouth daily.     Blood Glucose Monitoring Suppl (TRUE METRIX METER) w/Device KIT Use as Directed 1 kit 1   clopidogrel (PLAVIX) 75 MG tablet Take 1 tablet (75 mg total) by mouth daily. 30 tablet 2   clopidogrel (PLAVIX) 75 MG tablet Take 1 tablet (75 mg total) by mouth daily. 30 tablet 0   diltiazem (CARDIZEM CD) 240 MG 24 hr capsule TAKE 1 CAPSULE EVERY DAY 30 capsule 11   doxazosin (CARDURA) 8 MG tablet TAKE 1 TABLET EVERY DAY 90 tablet 3   fluticasone  (FLONASE) 50 MCG/ACT nasal spray Place 2 sprays into both nostrils 2 (two) times daily as needed for allergies or rhinitis.     furosemide (LASIX) 20 MG tablet Take 1 tablet (20 mg total) by mouth daily as needed for edema (SOB). 30 tablet 0   glipiZIDE (GLUCOTROL) 5 MG tablet TAKE 1 TABLET TWICE DAILY BEFORE MEALS (NEED MD APPOINTMENT FOR REFILLS) 180 tablet 1   glucose blood (TRUE METRIX BLOOD GLUCOSE TEST) test strip 1 each by Other route daily. DX: E11.9 100 strip 5   Lancet Devices (PRODIGY LANCING DEVICE) MISC Use bid DX e11.9 1 each 2   lisinopril (ZESTRIL) 40 MG tablet TAKE 1 TABLET EVERY DAY 90 tablet 10   metoprolol succinate (TOPROL-XL) 50 MG 24 hr tablet TAKE 1 TABLET EVERY DAY. TAKE WITH OR IMMEDIATELY FOLLOWING A MEAL (NEED MD APPOINTMENT FOR REFILLS) 30 tablet 11   Multiple Vitamin (MULTIVITAMIN WITH MINERALS) TABS Take 1 tablet by mouth daily.     Omega-3 Fatty Acids (FISH OIL) 1200 MG CAPS Take 1,200 mg by mouth 2 (two) times daily.     pantoprazole (PROTONIX) 20 MG tablet TAKE 1 TABLET EVERY DAY 90 tablet 10   rosuvastatin (CRESTOR) 20 MG tablet TAKE 1 TABLET EVERY DAY 90 tablet 3   TRUEplus Lancets 33G MISC TEST BLOOD SUGAR EVERY DAY 100 each 3   TRULICITY 1.5 MG/0.5ML SOAJ INJECT 1.5 MG (0.5ML) UNDER THE SKIN ONCE A WEEK 4 mL 0   No current facility-administered medications on file prior to visit.   Allergies  Allergen Reactions   Jardiance [Empagliflozin] Nausea And Vomiting    Severe nausea-    Social History   Socioeconomic History   Marital status: Married    Spouse name: Not on file   Number of children: Not on file   Years of education: Not on file   Highest education level: Not on file  Occupational History   Occupation: retired    Associate Professor: RETIRED    Comment: Autom[employer]   Tobacco Use   Smoking status: Former    Current packs/day: 0.00    Types: Cigarettes    Start date: 01/01/1953    Quit date: 01/02/1988    Years since quitting: 35.1   Smokeless  tobacco: Never  Vaping Use   Vaping status: Never Used  Substance and Sexual Activity   Alcohol use: Not Currently    Comment: rare beer, no history of ETOH abuse   Drug use: No   Sexual activity: Not on file  Other Topics Concern   Not on file  Social History Narrative   Not on file   Social Drivers of Health   Financial Resource Strain: Low Risk  (01/11/2023)   Overall Financial Resource Strain (CARDIA)    Difficulty of Paying Living Expenses: Not hard at  all  Food Insecurity: No Food Insecurity (01/11/2023)   Hunger Vital Sign    Worried About Running Out of Food in the Last Year: Never true    Ran Out of Food in the Last Year: Never true  Transportation Needs: No Transportation Needs (01/11/2023)   PRAPARE - Administrator, Civil Service (Medical): No    Lack of Transportation (Non-Medical): No  Physical Activity: Inactive (01/11/2023)   Exercise Vital Sign    Days of Exercise per Week: 0 days    Minutes of Exercise per Session: 0 min  Stress: No Stress Concern Present (01/11/2023)   [employer] of Occupational Health - Occupational Stress Questionnaire    Feeling of Stress : Not at all  Social Connections: Socially Integrated (01/11/2023)   Social Connection and Isolation Panel [NHANES]    Frequency of Communication with Friends and Family: More than three times a week    Frequency of Social Gatherings with Friends and Family: Three times a week    Attends Religious Services: More than 4 times per year    Active Member of Clubs or Organizations: Yes    Attends Engineer, structural: More than 4 times per year    Marital Status: Married  Catering manager Violence: Not on file   Family History  Problem Relation Age of Onset   Diabetes Mother    Heart disease Mother        After age 24   Heart attack Mother    Hypertension Mother    Diabetes Father    Heart disease Father        After age 39   Hypertension Father    Heart attack Father     Brain cancer Sister    Cancer Sister        Brain   Diabetes Sister    Hypertension Sister    Cancer Brother        Chest Tumor   Diabetes Brother    Hypertension Brother    Deep vein thrombosis Sister    Diabetes Sister    Diabetes Brother        Bilateral leg   Colon cancer Neg Hx       Review of Systems  All other systems reviewed and are negative.      Objective:   Physical Exam Vitals reviewed.  Constitutional:      General: He is not in acute distress.    Appearance: He is well-developed. He is not diaphoretic.  HENT:     Head: Normocephalic and atraumatic.  Neck:     Thyroid: No thyromegaly.     Vascular: No JVD.     Trachea: No tracheal deviation.  Cardiovascular:     Rate and Rhythm: Normal rate and regular rhythm.     Heart sounds: Murmur heard.     No friction rub. No gallop.  Pulmonary:     Effort: Pulmonary effort is normal. No respiratory distress.     Breath sounds: Normal breath sounds. No stridor. No wheezing or rales.  Chest:     Chest wall: No tenderness.  Abdominal:     General: Bowel sounds are normal.     Palpations: Abdomen is soft.  Musculoskeletal:     Right lower leg: Edema present.     Left lower leg: Edema present.  Neurological:     Mental Status: He is alert and oriented to person, place, and time.     Cranial Nerves: No cranial nerve  deficit.     Motor: No abnormal muscle tone.     Coordination: Coordination normal.     Deep Tendon Reflexes: Reflexes are normal and symmetric.  Psychiatric:        Behavior: Behavior normal.        Thought Content: Thought content normal.        Judgment: Judgment normal.           Assessment & Plan:  Coronary artery disease involving native coronary artery of native heart without angina pectoris - Plan: Hemoglobin A1c  Essential hypertension - Plan: Hemoglobin A1c  Stage 3b chronic kidney disease (HCC) - Plan: Hemoglobin A1c  Controlled type 2 diabetes mellitus without  complication, without long-term current use of insulin (HCC) - Plan: Hemoglobin A1c Still awaiting hemoglobin A1c.  LDL cholesterol is acceptable.  Renal function is slightly worse and urine protein to creatinine ratio was elevated.  I have recommended discontinuation of glipizide and replacing with Jardiance 25 mg a day.  This should help his renal function as well as his diastolic dysfunction and help manage his blood sugars.  Patient will try samples that I gave him and then let me know if he wants me to send in a prescription.

## 2023-03-03 LAB — COMPLETE METABOLIC PANEL WITHOUT GFR
AG Ratio: 1.6 (calc) (ref 1.0–2.5)
ALT: 18 U/L (ref 9–46)
AST: 16 U/L (ref 10–35)
Albumin: 4 g/dL (ref 3.6–5.1)
Alkaline phosphatase (APISO): 67 U/L (ref 35–144)
BUN/Creatinine Ratio: 12 (calc) (ref 6–22)
BUN: 19 mg/dL (ref 7–25)
CO2: 26 mmol/L (ref 20–32)
Calcium: 8.9 mg/dL (ref 8.6–10.3)
Chloride: 108 mmol/L (ref 98–110)
Creat: 1.63 mg/dL — ABNORMAL HIGH (ref 0.70–1.22)
Globulin: 2.5 g/dL (ref 1.9–3.7)
Glucose, Bld: 177 mg/dL — ABNORMAL HIGH (ref 65–99)
Potassium: 4.6 mmol/L (ref 3.5–5.3)
Sodium: 143 mmol/L (ref 135–146)
Total Bilirubin: 0.4 mg/dL (ref 0.2–1.2)
Total Protein: 6.5 g/dL (ref 6.1–8.1)
eGFR: 41 mL/min/1.73m2 — ABNORMAL LOW

## 2023-03-03 LAB — LIPID PANEL
Cholesterol: 116 mg/dL
HDL: 38 mg/dL — ABNORMAL LOW
LDL Cholesterol (Calc): 56 mg/dL
Non-HDL Cholesterol (Calc): 78 mg/dL
Total CHOL/HDL Ratio: 3.1 (calc)
Triglycerides: 137 mg/dL

## 2023-03-03 LAB — MICROALBUMIN / CREATININE URINE RATIO
Creatinine, Urine: 235 mg/dL (ref 20–320)
Microalb Creat Ratio: 358 mg/g{creat} — ABNORMAL HIGH
Microalb, Ur: 84.2 mg/dL

## 2023-03-03 LAB — HEMOGLOBIN A1C
Hgb A1c MFr Bld: 7.4 %{Hb} — ABNORMAL HIGH (ref <5.7)
Mean Plasma Glucose: 166 mg/dL
eAG (mmol/L): 9.2 mmol/L

## 2023-03-03 LAB — CBC WITH DIFFERENTIAL/PLATELET
Absolute Lymphocytes: 1537 {cells}/uL (ref 850–3900)
Absolute Monocytes: 537 {cells}/uL (ref 200–950)
Basophils Absolute: 31 {cells}/uL (ref 0–200)
Basophils Relative: 0.5 %
Eosinophils Absolute: 153 {cells}/uL (ref 15–500)
Eosinophils Relative: 2.5 %
HCT: 33 % — ABNORMAL LOW (ref 38.5–50.0)
Hemoglobin: 10.7 g/dL — ABNORMAL LOW (ref 13.2–17.1)
MCH: 31 pg (ref 27.0–33.0)
MCHC: 32.4 g/dL (ref 32.0–36.0)
MCV: 95.7 fL (ref 80.0–100.0)
MPV: 9.7 fL (ref 7.5–12.5)
Monocytes Relative: 8.8 %
Neutro Abs: 3843 {cells}/uL (ref 1500–7800)
Neutrophils Relative %: 63 %
Platelets: 123 Thousand/uL — ABNORMAL LOW (ref 140–400)
RBC: 3.45 Million/uL — ABNORMAL LOW (ref 4.20–5.80)
RDW: 13.9 % (ref 11.0–15.0)
Total Lymphocyte: 25.2 %
WBC: 6.1 Thousand/uL (ref 3.8–10.8)

## 2023-03-08 ENCOUNTER — Encounter: Payer: Self-pay | Admitting: Family Medicine

## 2023-03-21 ENCOUNTER — Encounter: Payer: Self-pay | Admitting: Family Medicine

## 2023-03-31 ENCOUNTER — Encounter: Payer: Self-pay | Admitting: Family Medicine

## 2023-04-02 ENCOUNTER — Other Ambulatory Visit: Payer: Self-pay

## 2023-04-03 ENCOUNTER — Other Ambulatory Visit: Payer: Self-pay

## 2023-04-03 DIAGNOSIS — I10 Essential (primary) hypertension: Secondary | ICD-10-CM

## 2023-04-03 DIAGNOSIS — E119 Type 2 diabetes mellitus without complications: Secondary | ICD-10-CM

## 2023-04-03 DIAGNOSIS — N1832 Chronic kidney disease, stage 3b: Secondary | ICD-10-CM

## 2023-04-03 MED ORDER — DOXAZOSIN MESYLATE 8 MG PO TABS: 8.0000 mg | ORAL_TABLET | Freq: Every day | ORAL | 1 refills | Status: DC

## 2023-04-03 MED ORDER — GLIPIZIDE 5 MG PO TABS: ORAL_TABLET | ORAL | 1 refills | Status: DC

## 2023-04-05 ENCOUNTER — Other Ambulatory Visit: Payer: Self-pay

## 2023-04-05 DIAGNOSIS — I10 Essential (primary) hypertension: Secondary | ICD-10-CM

## 2023-04-05 DIAGNOSIS — N1832 Chronic kidney disease, stage 3b: Secondary | ICD-10-CM

## 2023-04-05 DIAGNOSIS — E119 Type 2 diabetes mellitus without complications: Secondary | ICD-10-CM

## 2023-04-05 MED ORDER — GLIPIZIDE 5 MG PO TABS: ORAL_TABLET | ORAL | 1 refills | Status: DC

## 2023-04-05 MED ORDER — DOXAZOSIN MESYLATE 8 MG PO TABS: 8.0000 mg | ORAL_TABLET | Freq: Every day | ORAL | 1 refills | Status: DC

## 2023-04-12 ENCOUNTER — Other Ambulatory Visit: Payer: Self-pay | Admitting: Family Medicine

## 2023-04-13 NOTE — Telephone Encounter (Signed)
Requested medication (s) are due for refill today: Yes  Requested medication (s) are on the active medication list: Yes  Last refill:  02/19/23  Future visit scheduled: No  Notes to clinic:  Last OV 09/29/22     Requested Prescriptions  Pending Prescriptions Disp Refills   TRULICITY 1.5 MG/0.5ML SOAJ [Pharmacy Med Name: Trulicity Subcutaneous Solution Auto-injector 1.5 MG/0.5ML] 4 mL 0    Sig: INJECT 1.5 MG (0.5ML) UNDER THE SKIN ONCE A WEEK     Endocrinology:  Diabetes - GLP-1 Receptor Agonists Failed - 04/13/2023  8:53 AM      Failed - Valid encounter within last 6 months    Recent Outpatient Visits           2 years ago Diabetes mellitus without complication (HCC)   [facility] Donita Brooks, MD   2 years ago Irregular heart beat   [facility] Donita Brooks, MD   2 years ago Essential hypertension   [facility] Tanya Nones, Priscille Heidelberg, MD   3 years ago Iron deficiency anemia, unspecified iron deficiency anemia type   [facility] Donita Brooks, MD   3 years ago Shortness of breath   [facility] Pickard, Priscille Heidelberg, MD       Future Appointments             In 1 month Mallipeddi, Orion Modest, MD [facility] [place] [facility]            Passed - HBA1C is between 0 and 7.9 and within 180 days    Hgb A1c MFr Bld  Date Value Ref Range Status  02/26/2023 7.4 (H) <5.7 % of total Hgb Final    Comment:    For someone without known diabetes, a hemoglobin A1c value of 6.5% or greater indicates that they may have  diabetes and this should be confirmed with a follow-up  test. . For someone with known diabetes, a value <7% indicates  that their diabetes is well controlled and a value  greater than or equal to 7% indicates suboptimal  control. A1c targets should be individualized based on  duration of diabetes, age, comorbid conditions, and  other  considerations. . Currently, no consensus exists regarding use of hemoglobin A1c for diagnosis of diabetes for children. Marland Kitchen

## 2023-04-28 ENCOUNTER — Encounter: Payer: Self-pay | Admitting: Family Medicine

## 2023-04-30 ENCOUNTER — Other Ambulatory Visit: Payer: Self-pay

## 2023-04-30 MED ORDER — PANTOPRAZOLE SODIUM 20 MG PO TBEC: 20.0000 mg | DELAYED_RELEASE_TABLET | Freq: Every day | ORAL | 10 refills | Status: AC

## 2023-04-30 MED ORDER — ROSUVASTATIN CALCIUM 20 MG PO TABS: 20.0000 mg | ORAL_TABLET | Freq: Every day | ORAL | 3 refills | Status: AC

## 2023-04-30 MED ORDER — ALLOPURINOL 300 MG PO TABS: 300.0000 mg | ORAL_TABLET | Freq: Every day | ORAL | 10 refills | Status: AC

## 2023-05-25 ENCOUNTER — Ambulatory Visit: Payer: Medicare HMO | Attending: Internal Medicine | Admitting: Internal Medicine

## 2023-05-25 ENCOUNTER — Encounter: Payer: Self-pay | Admitting: Internal Medicine

## 2023-05-25 VITALS — BP 158/68 | HR 86 | Ht 70.0 in | Wt 243.2 lb

## 2023-05-25 DIAGNOSIS — I7143 Infrarenal abdominal aortic aneurysm, without rupture: Secondary | ICD-10-CM | POA: Diagnosis not present

## 2023-05-25 DIAGNOSIS — I2510 Atherosclerotic heart disease of native coronary artery without angina pectoris: Secondary | ICD-10-CM

## 2023-05-25 NOTE — Patient Instructions (Signed)
 Medication Instructions:  Your physician recommends that you continue on your current medications as directed. Please refer to the Current Medication list given to you today.  *If you need a refill on your cardiac medications before your next appointment, please call your pharmacy*   Lab Work: None If you have labs (blood work) drawn today and your tests are completely normal, you will receive your results only by: MyChart Message (if you have MyChart) OR A paper copy in the mail If you have any lab test that is abnormal or we need to change your treatment, we will call you to review the results.   Testing/Procedures: Noen   Follow-Up: At [facility], you and your health needs are our priority.  As part of our continuing mission to provide you with exceptional heart care, we have created designated Provider Care Teams.  These Care Teams include your primary Cardiologist (physician) and Advanced Practice Providers (APPs -  Physician Assistants and Nurse Practitioners) who all work together to provide you with the care you need, when you need it.  We recommend signing up for the patient portal called "MyChart".  Sign up information is provided on this After Visit Summary.  MyChart is used to connect with patients for Virtual Visits (Telemedicine).  Patients are able to view lab/test results, encounter notes, upcoming appointments, etc.  Non-urgent messages can be sent to your provider as well.   To learn more about what you can do with MyChart, go to [email or web address].    Your next appointment:   1 year(s)  Provider:   You may see Vishnu P Mallipeddi, MD or one of the following Advanced Practice Providers on your designated Care Team:   [place], PA-C  Jacolyn Reedy, [place]     Other Instructions

## 2023-05-25 NOTE — Progress Notes (Signed)
 Cardiology Office Note  Date: 05/25/2023   ID: Juan [name], DOB Feb 19, 1941, MRN [id number]  PCP:  Donita Brooks, MD  Cardiologist:  Marjo Bicker, MD Electrophysiologist:  None   History of Present Illness: Juan [name] is a 83 y.o. male known to have CAD s/p angioplasty in 1999, PAD s/p kissing balloon angioplasty and stenting of bilateral common iliac arteries in 6/24, AAA s/p EVAR in 4/24, OSA, mild aortic valve stenosis, HTN, DM 2, CKD stage III is here for follow-up visit.  No angina, DOE, dizziness, presyncope, syncope, leg swelling or palpitations.  He does have positional dizziness but no exertional dizziness.  He has an incline at his house that is when he feels little short of breath when he goes uphill but does okay when he goes downhill.  He sleeps in a recliner due to back pain.  Does not do much at home due to back pain.  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    50-69% (12/2015)   Neuromuscular disorder (HCC)    L2-3,L5-S1 bulging disc /nerve impingement   PAD (peripheral artery disease) (HCC)    Sleep apnea     Past Surgical History:  Procedure Laterality Date   ABDOMINAL AORTIC ENDOVASCULAR STENT GRAFT Bilateral 06/13/2022   Procedure: ABDOMINAL AORTIC ENDOVASCULAR STENT GRAFT;  Surgeon: Chuck Hint, MD;  Location: [facility] OR;  Service: Vascular;  Laterality: Bilateral;   ABDOMINAL AORTOGRAM W/LOWER EXTREMITY N/A 08/14/2022   Procedure: ABDOMINAL AORTOGRAM W/LOWER EXTREMITY;  Surgeon: Chuck Hint, MD;  Location: [facility] INVASIVE CV LAB;  Service:  Cardiovascular;  Laterality: N/A;   CATARACT EXTRACTION W/ INTRAOCULAR LENS IMPLANT Bilateral    about 3 years ago   COLONOSCOPY N/A 09/27/2012   Procedure: COLONOSCOPY;  Surgeon: Corbin Ade, MD;  [facility];  Service: Endoscopy;  Laterality: N/A;  9:30   CORONARY ANGIOPLASTY     1989   PERIPHERAL VASCULAR INTERVENTION Bilateral 08/14/2022   Procedure: PERIPHERAL VASCULAR INTERVENTION;  Surgeon: Chuck Hint, MD;  Location: [facility] INVASIVE CV LAB;  Service: Cardiovascular;  Laterality: Bilateral;  Iliac   TONSILLECTOMY     ULTRASOUND GUIDANCE FOR VASCULAR ACCESS Bilateral 06/13/2022   Procedure: ULTRASOUND GUIDANCE FOR VASCULAR ACCESS;  Surgeon: Chuck Hint, MD;  Location: [facility] OR;  Service: Vascular;  Laterality: Bilateral;    Current Outpatient Medications  Medication Sig Dispense Refill   acetaminophen (TYLENOL) 500 MG tablet Take 1,000 mg by mouth every 6 (six) hours as needed for moderate pain.     allopurinol (ZYLOPRIM) 300 MG tablet Take 1 tablet (300 mg total) by mouth daily. 90 tablet 10   aspirin EC 81 MG tablet Take 81 mg by mouth daily.     Blood Glucose Monitoring Suppl (TRUE METRIX METER) w/Device KIT Use as Directed 1 kit 1   clopidogrel (PLAVIX) 75 MG tablet Take 1 tablet (75 mg total) by mouth daily. 30 tablet 2   diltiazem (CARDIZEM CD) 240 MG 24 hr capsule TAKE 1 CAPSULE EVERY DAY 30 capsule 11   doxazosin (  CARDURA) 8 MG tablet Take 1 tablet (8 mg total) by mouth daily. 90 tablet 1   fluticasone (FLONASE) 50 MCG/ACT nasal spray Place 2 sprays into both nostrils 2 (two) times daily as needed for allergies or rhinitis.     glipiZIDE (GLUCOTROL) 5 MG tablet TAKE 1 TABLET TWICE DAILY BEFORE MEALS. 180 tablet 1   glucose blood (TRUE METRIX BLOOD GLUCOSE TEST) test strip 1 each by Other route daily. DX: E11.9 100 strip 5   Lancet Devices (PRODIGY LANCING DEVICE) MISC Use bid DX e11.9 1 each 2   lisinopril (ZESTRIL) 40 MG tablet TAKE 1 TABLET EVERY  DAY 90 tablet 10   metoprolol succinate (TOPROL-XL) 50 MG 24 hr tablet TAKE 1 TABLET EVERY DAY. TAKE WITH OR IMMEDIATELY FOLLOWING A MEAL (NEED MD APPOINTMENT FOR REFILLS) 30 tablet 11   Multiple Vitamin (MULTIVITAMIN WITH MINERALS) TABS Take 1 tablet by mouth daily.     Omega-3 Fatty Acids (FISH OIL) 1200 MG CAPS Take 1,200 mg by mouth 2 (two) times daily.     pantoprazole (PROTONIX) 20 MG tablet Take 1 tablet (20 mg total) by mouth daily. 90 tablet 10   rosuvastatin (CRESTOR) 20 MG tablet Take 1 tablet (20 mg total) by mouth daily. 90 tablet 3   TRUEplus Lancets 33G MISC TEST BLOOD SUGAR EVERY DAY 100 each 3   TRULICITY 1.5 MG/0.5ML SOAJ INJECT 1.5 MG (0.5ML) UNDER THE SKIN ONCE A WEEK 4 mL 0   furosemide (LASIX) 20 MG tablet Take 1 tablet (20 mg total) by mouth daily as needed for edema (SOB). (Patient not taking: Reported on 05/25/2023) 30 tablet 0   No current facility-administered medications for this visit.   Allergies:  Jardiance [empagliflozin]   Social History: The patient  reports that he quit smoking about 35 years ago. His smoking use included cigarettes. He started smoking about 70 years ago. He has never used smokeless tobacco. He reports that he does not currently use alcohol. He reports that he does not use drugs.   Family History: The patient's family history includes Brain cancer in his sister; Cancer in his brother and sister; Deep vein thrombosis in his sister; Diabetes in his brother, brother, father, mother, sister, and sister; Heart attack in his father and mother; Heart disease in his father and mother; Hypertension in his brother, father, mother, and sister.   ROS:  Please see the history of present illness. Otherwise, complete review of systems is positive for none.  All other systems are reviewed and negative.   Physical Exam: VS:  BP (!) 158/68   Pulse 86   Ht 5\' 10"  (1.778 m)   Wt 243 lb 3.2 oz (110.3 kg)   SpO2 95%   BMI 34.90 kg/m , BMI Body mass index is  34.9 kg/m.  Wt Readings from Last 3 Encounters:  05/25/23 243 lb 3.2 oz (110.3 kg)  03/01/23 245 lb 3.2 oz (111.2 kg)  01/11/23 247 lb (112 kg)    General: Patient appears comfortable at rest. HEENT: Conjunctiva and lids normal, oropharynx clear with moist mucosa. Neck: Supple, no elevated JVP or carotid bruits, no thyromegaly. Lungs: Clear to auscultation, nonlabored breathing at rest. Cardiac: Regular rate and rhythm, no S3 or significant systolic murmur, no pericardial rub. Abdomen: Soft, nontender, no hepatomegaly, bowel sounds present, no guarding or rebound. Extremities: No pitting edema, distal pulses 2+. Skin: Warm and dry. Musculoskeletal: No kyphosis. Neuropsychiatric: Alert and oriented x3, affect grossly appropriate.  Recent Labwork: 02/26/2023: ALT 18; AST  16; BUN 19; Creat 1.63; Hemoglobin 10.7; Platelets 123; Potassium 4.6; Sodium 143     Component Value Date/Time   CHOL 116 02/26/2023 0905   TRIG 137 02/26/2023 0905   HDL 38 (L) 02/26/2023 0905   CHOLHDL 3.1 02/26/2023 0905   VLDL 35 (H) 08/28/2016 0845   LDLCALC 56 02/26/2023 0905     Assessment and Plan:  CAD s/p angioplasty in 1989 PAD s/p kissing angioplasty and stenting of bilateral common iliac arteries in 08/2022 AAA 5.7 cm s/p EVAR in 06/2022 Mild aortic valve stenosis in 2024 HTN, controlled HLD, partially at goal  -No interval angina or DOE.  Not much active at home due to back pain.  Sleeps in a recliner due to back pain.  Currently on DAPT per vascular surgery.  Instructed patient to follow-up with vascular for further recommendations on DAPT.  Continue rosuvastatin 20 mg nightly, goal LDL less than 55, currently at goal.  LDL from 02/2023 was 56.  TG was also within normal limits. -Echocardiogram in 2024 showed normal LVEF, normal RV function, mild aortic valve stenosis.  Asymptomatic currently.  Physical examination showed findings consistent with mild to moderate aortic valve stenosis.  Will  repeat echocardiogram in 2027. -Continue current antihypertensives.  EKG today showed normal sinus rhythm and first-degree AV block, HR 67 bpm.  He does not have symptoms of dizziness, presyncope, syncope or severe fatigue.  Continue diltiazem 240 mg once daily, lisinopril 40 mg once daily and metoprolol succinate 50 mg once daily.  Follows with PCP for HTN management. -He was prescribed p.o. Lasix as needed for LE swelling/DOE in the last clinic visit but he did not have to take any pills so far.   Medication Adjustments/Labs and Tests Ordered: Current medicines are reviewed at length with the patient today.  Concerns regarding medicines are outlined above.    Disposition:  Follow up 1 year  Signed, Jayshawn Colston Verne Spurr, MD, 05/25/2023 9:05 AM    [facility] at [facility] [address], [place], [place] [address]

## 2023-06-04 ENCOUNTER — Ambulatory Visit: Payer: Medicare HMO | Admitting: Internal Medicine

## 2023-08-04 ENCOUNTER — Encounter: Payer: Self-pay | Admitting: Family Medicine

## 2023-08-07 ENCOUNTER — Other Ambulatory Visit: Payer: Self-pay

## 2023-08-07 DIAGNOSIS — N1832 Chronic kidney disease, stage 3b: Secondary | ICD-10-CM

## 2023-08-07 DIAGNOSIS — I2510 Atherosclerotic heart disease of native coronary artery without angina pectoris: Secondary | ICD-10-CM

## 2023-08-07 DIAGNOSIS — I10 Essential (primary) hypertension: Secondary | ICD-10-CM

## 2023-08-07 DIAGNOSIS — E119 Type 2 diabetes mellitus without complications: Secondary | ICD-10-CM

## 2023-08-07 MED ORDER — DILTIAZEM HCL ER COATED BEADS 240 MG PO CP24: 240.0000 mg | ORAL_CAPSULE | Freq: Every day | ORAL | 3 refills | Status: DC

## 2023-08-07 MED ORDER — TRULICITY 3 MG/0.5ML ~~LOC~~ SOAJ: 3.0000 mg | SUBCUTANEOUS | 0 refills | Status: DC

## 2023-08-07 MED ORDER — METOPROLOL SUCCINATE ER 50 MG PO TB24: 50.0000 mg | ORAL_TABLET | Freq: Every day | ORAL | 3 refills | Status: AC

## 2023-08-07 MED ORDER — LISINOPRIL 40 MG PO TABS
ORAL_TABLET | ORAL | 3 refills | Status: DC
Start: 2023-08-07 — End: 2023-09-17

## 2023-08-09 ENCOUNTER — Other Ambulatory Visit (HOSPITAL_COMMUNITY): Payer: Self-pay

## 2023-08-10 ENCOUNTER — Telehealth: Payer: Self-pay | Admitting: Pharmacy Technician

## 2023-08-10 ENCOUNTER — Other Ambulatory Visit (HOSPITAL_COMMUNITY): Payer: Self-pay

## 2023-08-10 NOTE — Telephone Encounter (Signed)
 Pharmacy Patient Advocate Encounter   Received notification from CoverMyMeds that prior authorization for Trulicity  3MG /0.5ML auto-injectors is required/requested.   Insurance verification completed.   The patient is insured through [facility] ADVANTAGE/RX ADVANCE .   Per test claim: PA required and submitted KEY/EOC/Request #: [id number] from 08/10/23 to 08/09/24. Ran test claim, Copay is $0.00. This test claim was processed through [facility]- copay amounts may vary at other pharmacies due to pharmacy/plan contracts, or as the patient moves through the different stages of their insurance plan.   PA Case ID #: [id number]

## 2023-08-17 NOTE — Telephone Encounter (Signed)
 MA has contacted [facility] pharmacy In [place]  to confirm they received increased dosage of 3ml.Aaron Aas Pharmacist confirmed new order was received and they would fill medication today. Called pt. To inform of medication approval was not able to make contact. Lvm w/ information.

## 2023-08-23 ENCOUNTER — Telehealth: Payer: Self-pay

## 2023-08-23 ENCOUNTER — Ambulatory Visit: Admitting: Family Medicine

## 2023-08-23 ENCOUNTER — Encounter: Payer: Self-pay | Admitting: Family Medicine

## 2023-08-23 ENCOUNTER — Ambulatory Visit

## 2023-08-23 VITALS — BP 130/80 | HR 65 | Temp 97.7°F | Ht 70.0 in | Wt 237.2 lb

## 2023-08-23 DIAGNOSIS — N1832 Chronic kidney disease, stage 3b: Secondary | ICD-10-CM

## 2023-08-23 DIAGNOSIS — I2510 Atherosclerotic heart disease of native coronary artery without angina pectoris: Secondary | ICD-10-CM

## 2023-08-23 DIAGNOSIS — E119 Type 2 diabetes mellitus without complications: Secondary | ICD-10-CM | POA: Diagnosis not present

## 2023-08-23 DIAGNOSIS — Z7985 Long-term (current) use of injectable non-insulin antidiabetic drugs: Secondary | ICD-10-CM

## 2023-08-23 LAB — LIPID PANEL
Cholesterol: 123 mg/dL (ref <200)
HDL: 36 mg/dL — ABNORMAL LOW (ref >=40)
LDL Cholesterol (Calc): 62 mg/dL
Non-HDL Cholesterol (Calc): 87 mg/dL (ref <130)
Total CHOL/HDL Ratio: 3.4 (calc) (ref <5.0)
Triglycerides: 171 mg/dL — ABNORMAL HIGH (ref <150)

## 2023-08-23 LAB — COMPREHENSIVE METABOLIC PANEL WITH GFR
AG Ratio: 1.6 (calc) (ref 1.0–2.5)
ALT: 16 U/L (ref 9–46)
AST: 17 U/L (ref 10–35)
Albumin: 3.9 g/dL (ref 3.6–5.1)
Alkaline phosphatase (APISO): 71 U/L (ref 35–144)
BUN/Creatinine Ratio: 13 (calc) (ref 6–22)
BUN: 23 mg/dL (ref 7–25)
CO2: 26 mmol/L (ref 20–32)
Calcium: 9.2 mg/dL (ref 8.6–10.3)
Chloride: 105 mmol/L (ref 98–110)
Creat: 1.76 mg/dL — ABNORMAL HIGH (ref 0.70–1.22)
Globulin: 2.5 g/dL (ref 1.9–3.7)
Glucose, Bld: 172 mg/dL — ABNORMAL HIGH (ref 65–99)
Potassium: 4.6 mmol/L (ref 3.5–5.3)
Sodium: 140 mmol/L (ref 135–146)
Total Bilirubin: 0.6 mg/dL (ref 0.2–1.2)
Total Protein: 6.4 g/dL (ref 6.1–8.1)
eGFR: 37 mL/min/{1.73_m2} — ABNORMAL LOW (ref >=60)

## 2023-08-23 LAB — CBC WITH DIFFERENTIAL/PLATELET
Absolute Lymphocytes: 1424 {cells}/uL (ref 850–3900)
Absolute Monocytes: 504 {cells}/uL (ref 200–950)
Basophils Absolute: 13 {cells}/uL (ref 0–200)
Basophils Relative: 0.2 %
Eosinophils Absolute: 132 {cells}/uL (ref 15–500)
Eosinophils Relative: 2.1 %
HCT: 34.2 % — ABNORMAL LOW (ref 38.5–50.0)
Hemoglobin: 11 g/dL — ABNORMAL LOW (ref 13.2–17.1)
MCH: 31 pg (ref 27.0–33.0)
MCHC: 32.2 g/dL (ref 32.0–36.0)
MCV: 96.3 fL (ref 80.0–100.0)
MPV: 9.9 fL (ref 7.5–12.5)
Monocytes Relative: 8 %
Neutro Abs: 4227 {cells}/uL (ref 1500–7800)
Neutrophils Relative %: 67.1 %
Platelets: 123 10*3/uL — ABNORMAL LOW (ref 140–400)
RBC: 3.55 10*6/uL — ABNORMAL LOW (ref 4.20–5.80)
RDW: 13.5 % (ref 11.0–15.0)
Total Lymphocyte: 22.6 %
WBC: 6.3 10*3/uL (ref 3.8–10.8)

## 2023-08-23 LAB — HEMOGLOBIN A1C
Hgb A1c MFr Bld: 8 % — ABNORMAL HIGH (ref <5.7)
Mean Plasma Glucose: 183 mg/dL
eAG (mmol/L): 10.1 mmol/L

## 2023-08-23 NOTE — Progress Notes (Signed)
 Subjective:    Patient ID: Juan [name], male    DOB: May 06, 1940, 83 y.o.   MRN: [id number]  Patient is here today for follow-up with diabetes, hypertension, and hyperlipidemia.  He reports fasting blood sugars in the 150 range.  He is taking Trulicity  3 mg a day and tolerating it well.  Patient last saw his vascular surgeon in August 2024.  He has a history of a AAA repair.  He is due to see them again this August.  He denies any angina.  He denies any chest pain.  He denies any shortness of breath until exertion.  Continuous numbness and tingling and mild edema in both legs. 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    50-69% (12/2015)   Neuromuscular disorder (HCC)    L2-3,L5-S1 bulging disc /nerve impingement   PAD (peripheral artery disease) (HCC)    Sleep apnea    Past Surgical History:  Procedure Laterality Date   ABDOMINAL AORTIC ENDOVASCULAR STENT GRAFT Bilateral 06/13/2022   Procedure: ABDOMINAL AORTIC ENDOVASCULAR STENT GRAFT;  Surgeon: Dannis Dy, MD;  Location: [facility] OR;  Service: Vascular;  Laterality: Bilateral;   ABDOMINAL AORTOGRAM W/LOWER EXTREMITY N/A 08/14/2022   Procedure: ABDOMINAL AORTOGRAM W/LOWER EXTREMITY;  Surgeon: Dannis Dy, MD;  Location: [facility] INVASIVE CV LAB;  Service: Cardiovascular;  Laterality: N/A;   CATARACT EXTRACTION W/ INTRAOCULAR LENS IMPLANT Bilateral    about 3 years ago   COLONOSCOPY N/A 09/27/2012   Procedure: COLONOSCOPY;  Surgeon: Suzette Espy, MD;  Location: [facility];  Service: Endoscopy;  Laterality: N/A;  9:30   CORONARY ANGIOPLASTY      1989   PERIPHERAL VASCULAR INTERVENTION Bilateral 08/14/2022   Procedure: PERIPHERAL VASCULAR INTERVENTION;  Surgeon: Dannis Dy, MD;  Location: [facility] INVASIVE CV LAB;  Service: Cardiovascular;  Laterality: Bilateral;  Iliac   TONSILLECTOMY     ULTRASOUND GUIDANCE FOR VASCULAR ACCESS Bilateral 06/13/2022   Procedure: ULTRASOUND GUIDANCE FOR VASCULAR ACCESS;  Surgeon: Dannis Dy, MD;  Location: [facility] OR;  Service: Vascular;  Laterality: Bilateral;   Current Outpatient Medications on File Prior to Visit  Medication Sig Dispense Refill   acetaminophen  (TYLENOL ) 500 MG tablet Take 1,000 mg by mouth every 6 (six) hours as needed for moderate pain.     allopurinol  (ZYLOPRIM ) 300 MG tablet Take 1 tablet (300 mg total) by mouth daily. 90 tablet 10   aspirin  EC 81 MG tablet Take 81 mg by mouth daily.     Blood Glucose Monitoring Suppl (TRUE METRIX METER) w/Device KIT Use as Directed 1 kit 1   clopidogrel  (PLAVIX ) 75 MG tablet Take 1 tablet (75 mg total) by mouth daily. 30 tablet 2   diltiazem  (CARDIZEM  CD) 240 MG 24 hr capsule Take 1 capsule (240 mg total) by mouth daily. 90 capsule 3   doxazosin  (CARDURA ) 8 MG tablet Take 1 tablet (8 mg total) by mouth daily. 90 tablet 1   Dulaglutide  (TRULICITY ) 3 MG/0.5ML SOAJ Inject 3 mg as directed once a week. 2 mL 0   fluticasone (  FLONASE) 50 MCG/ACT nasal spray Place 2 sprays into both nostrils 2 (two) times daily as needed for allergies or rhinitis.     furosemide  (LASIX ) 20 MG tablet Take 1 tablet (20 mg total) by mouth daily as needed for edema (SOB). 30 tablet 0   glipiZIDE  (GLUCOTROL ) 5 MG tablet TAKE 1 TABLET TWICE DAILY BEFORE MEALS. 180 tablet 1   glucose blood (TRUE METRIX BLOOD GLUCOSE TEST) test strip 1 each by Other route daily. DX: E11.9 100 strip 5   Lancet Devices (PRODIGY LANCING DEVICE) MISC Use bid DX e11.9 1 each 2   lisinopril  (ZESTRIL ) 40 MG tablet TAKE 1 TABLET EVERY DAY 90 tablet 3   metoprolol  succinate (TOPROL -XL) 50 MG 24  hr tablet Take 1 tablet (50 mg total) by mouth daily. Take with or immediately following a meal. 90 tablet 3   Multiple Vitamin (MULTIVITAMIN WITH MINERALS) TABS Take 1 tablet by mouth daily.     Omega-3 Fatty Acids (FISH OIL) 1200 MG CAPS Take 1,200 mg by mouth 2 (two) times daily.     pantoprazole  (PROTONIX ) 20 MG tablet Take 1 tablet (20 mg total) by mouth daily. 90 tablet 10   rosuvastatin  (CRESTOR ) 20 MG tablet Take 1 tablet (20 mg total) by mouth daily. 90 tablet 3   TRUEplus Lancets 33G MISC TEST BLOOD SUGAR EVERY DAY 100 each 3   No current facility-administered medications on file prior to visit.   Allergies  Allergen Reactions   Jardiance  [Empagliflozin ] Nausea And Vomiting    Severe nausea-    Social History   Socioeconomic History   Marital status: Married    Spouse name: Not on file   Number of children: Not on file   Years of education: Not on file   Highest education level: Not on file  Occupational History   Occupation: retired    Associate Professor: RETIRED    Comment: [employer]   Tobacco Use   Smoking status: Former    Current packs/day: 0.00    Types: Cigarettes    Start date: 01/01/1953    Quit date: 01/02/1988    Years since quitting: 35.6   Smokeless tobacco: Never  Vaping Use   Vaping status: Never Used  Substance and Sexual Activity   Alcohol use: Not Currently    Comment: rare beer, no history of ETOH abuse   Drug use: No   Sexual activity: Not Currently  Other Topics Concern   Not on file  Social History Narrative   Not on file   Social Drivers of Health   Financial Resource Strain: Low Risk  (01/11/2023)   Overall Financial Resource Strain (CARDIA)    Difficulty of Paying Living Expenses: Not hard at all  Food Insecurity: No Food Insecurity (01/11/2023)   Hunger Vital Sign    Worried About Running Out of Food in the Last Year: Never true    Ran Out of Food in the Last Year: Never true  Transportation Needs: No Transportation Needs (01/11/2023)    PRAPARE - Administrator, Civil Service (Medical): No    Lack of Transportation (Non-Medical): No  Physical Activity: Inactive (01/11/2023)   Exercise Vital Sign    Days of Exercise per Week: 0 days    Minutes of Exercise per Session: 0 min  Stress: No Stress Concern Present (01/11/2023)   [employer] of Occupational Health - Occupational Stress Questionnaire    Feeling of Stress : Not at all  Social Connections: Socially Integrated (01/11/2023)  Social Connection and Isolation Panel    Frequency of Communication with Friends and Family: More than three times a week    Frequency of Social Gatherings with Friends and Family: Three times a week    Attends Religious Services: More than 4 times per year    Active Member of Clubs or Organizations: Yes    Attends Engineer, structural: More than 4 times per year    Marital Status: Married  Catering manager Violence: Not on file   Family History  Problem Relation Age of Onset   Diabetes Mother    Heart disease Mother        After age 83   Heart attack Mother    Hypertension Mother    Diabetes Father    Heart disease Father        After age 59   Hypertension Father    Heart attack Father    Brain cancer Sister    Cancer Sister        Brain   Diabetes Sister    Hypertension Sister    Cancer Brother        Chest Tumor   Diabetes Brother    Hypertension Brother    Deep vein thrombosis Sister    Diabetes Sister    Diabetes Brother        Bilateral leg   Colon cancer Neg Hx       Review of Systems  All other systems reviewed and are negative.      Objective:   Physical Exam Vitals reviewed.  Constitutional:      General: He is not in acute distress.    Appearance: He is well-developed. He is not diaphoretic.  HENT:     Head: Normocephalic and atraumatic.  Neck:     Thyroid : No thyromegaly.     Vascular: No JVD.     Trachea: No tracheal deviation.   Cardiovascular:     Rate and  Rhythm: Normal rate and regular rhythm.     Heart sounds: Murmur heard.     No friction rub. No gallop.  Pulmonary:     Effort: Pulmonary effort is normal. No respiratory distress.     Breath sounds: Normal breath sounds. No stridor. No wheezing or rales.  Chest:     Chest wall: No tenderness.  Abdominal:     General: Bowel sounds are normal.     Palpations: Abdomen is soft.   Musculoskeletal:     Right lower leg: Edema present.     Left lower leg: Edema present.   Neurological:     Mental Status: He is alert and oriented to person, place, and time.     Cranial Nerves: No cranial nerve deficit.     Motor: No abnormal muscle tone.     Coordination: Coordination normal.     Deep Tendon Reflexes: Reflexes are normal and symmetric.   Psychiatric:        Behavior: Behavior normal.        Thought Content: Thought content normal.        Judgment: Judgment normal.           Assessment & Plan:  Controlled type 2 diabetes mellitus without complication, without long-term current use of insulin  (HCC) - Plan: CBC with Differential/Platelet, Comprehensive metabolic panel with GFR, Lipid panel, Hemoglobin A1c  Stage 3b chronic kidney disease (HCC)  Coronary artery disease involving native coronary artery of native heart without angina pectoris Patient is currently on Trulicity .  I feel that switching Trulicity  to Mounjaro would offer better weight loss and better glycemic control.  Patient will check with his insurance to see if this medication is covered.  Blood pressure today is acceptable.  Check renal function.  Check CBC and CMP.  Check fasting lipid panel.  Goal LDL cholesterol is less than 55.  Patient is unable to tolerate SGLT2 medications for his diastolic dysfunction.

## 2023-08-23 NOTE — Progress Notes (Deleted)
 Juan [name] arrived 08/23/2023 and has given verbal consent to obtain images and complete their overdue diabetic retinal screening.  The images have been sent to an ophthalmologist or optometrist for review and interpretation.  Results will be sent back to [name], MD for review.  Patient has been informed they will be contacted when we receive the results via telephone or MyChart

## 2023-08-23 NOTE — Progress Notes (Signed)
 Unable to obtain pictures for diabetic retinal screening  Juan [name], CMA

## 2023-08-23 NOTE — Telephone Encounter (Signed)
 Unable to obtain diabetic eye exam today due to patients eyes not dilating. I have recommended that patient follow up with his ophthalmologist to obtain diabetic eye exam with dilation.   Vallerie Gave, CMA

## 2023-08-24 ENCOUNTER — Ambulatory Visit: Payer: Self-pay | Admitting: Family Medicine

## 2023-08-24 ENCOUNTER — Encounter: Payer: Self-pay | Admitting: Family Medicine

## 2023-08-24 DIAGNOSIS — E119 Type 2 diabetes mellitus without complications: Secondary | ICD-10-CM

## 2023-08-24 MED ORDER — TIRZEPATIDE 7.5 MG/0.5ML ~~LOC~~ SOAJ: 7.5000 mg | SUBCUTANEOUS | 0 refills | Status: DC

## 2023-08-24 NOTE — Telephone Encounter (Signed)
-----   Message from [name] sent at 08/24/2023  6:56 AM EDT ----- Kidney fcn is worse and diabetes is poorly controlled. I would recommend stopping trulicity  and replacing with mounjaro 7.5 mg sq weekly.  We will need to up titrate each month if he tolerates the  medicine to get him to the maximum dose.  If this doesn't work, the next step would be insulin .  Cholesterol looks good. Recheck labs in 4 months on mounjaro.   ----- Message ----- From: Addison Hol[facility] Results In Sent: 08/23/2023   6:50 PM EDT To: [name], MD

## 2023-08-24 NOTE — Telephone Encounter (Signed)
 Left message for patient to respond to results in mychart and appointment scheduled.

## 2023-08-27 ENCOUNTER — Encounter: Payer: Self-pay | Admitting: Family Medicine

## 2023-08-29 ENCOUNTER — Telehealth: Payer: Self-pay

## 2023-08-29 ENCOUNTER — Encounter: Payer: Self-pay | Admitting: Family Medicine

## 2023-08-29 ENCOUNTER — Other Ambulatory Visit (HOSPITAL_COMMUNITY): Payer: Self-pay

## 2023-08-29 NOTE — Telephone Encounter (Signed)
 Pharmacy Patient Advocate Encounter   Received notification from CoverMyMeds that prior authorization for Mounjaro 7.5MG /0.5ML auto-injectors  is required/requested.   Insurance verification completed.   The patient is insured through [facility] ADVANTAGE/RX ADVANCE .   Per test claim: PA required; PA started via CoverMyMeds. KEY [id number] . Waiting for clinical questions to populate.

## 2023-08-30 NOTE — Telephone Encounter (Signed)
 PLEASE BE ADVISED Clinical questions have been answered and PA submitted.TO PLAN. PA currently Pending.

## 2023-08-30 NOTE — Telephone Encounter (Signed)
 Pharmacy Patient Advocate Encounter  Received notification from [facility] ADVANTAGE/RX ADVANCE that Prior Authorization for Mounjaro 7.5MG /0.5ML auto-injectors has been APPROVED from 08/30/2023 to 09/05/2024 SEE APPROVAL MESSAGE BELOW    Message from Plan [id number]:19-JUN-26 Mounjaro 7.5MG /0.5ML [place] SOAJ Quantity:6;

## 2023-08-31 ENCOUNTER — Other Ambulatory Visit (HOSPITAL_COMMUNITY): Payer: Self-pay

## 2023-08-31 ENCOUNTER — Other Ambulatory Visit: Payer: Self-pay

## 2023-08-31 DIAGNOSIS — E119 Type 2 diabetes mellitus without complications: Secondary | ICD-10-CM

## 2023-08-31 MED ORDER — TIRZEPATIDE 7.5 MG/0.5ML ~~LOC~~ SOAJ: 7.5000 mg | SUBCUTANEOUS | 0 refills | Status: DC

## 2023-09-14 ENCOUNTER — Encounter: Payer: Self-pay | Admitting: Family Medicine

## 2023-09-17 ENCOUNTER — Other Ambulatory Visit: Payer: Self-pay

## 2023-09-17 DIAGNOSIS — I10 Essential (primary) hypertension: Secondary | ICD-10-CM

## 2023-09-17 DIAGNOSIS — N1832 Chronic kidney disease, stage 3b: Secondary | ICD-10-CM

## 2023-09-17 MED ORDER — LISINOPRIL 40 MG PO TABS: ORAL_TABLET | ORAL | 3 refills | Status: AC

## 2023-10-03 ENCOUNTER — Encounter: Payer: Self-pay | Admitting: Family Medicine

## 2023-10-03 ENCOUNTER — Other Ambulatory Visit: Payer: Self-pay

## 2023-10-03 DIAGNOSIS — I10 Essential (primary) hypertension: Secondary | ICD-10-CM

## 2023-10-03 DIAGNOSIS — N1832 Chronic kidney disease, stage 3b: Secondary | ICD-10-CM

## 2023-10-03 DIAGNOSIS — E119 Type 2 diabetes mellitus without complications: Secondary | ICD-10-CM

## 2023-10-03 MED ORDER — DOXAZOSIN MESYLATE 8 MG PO TABS: 8.0000 mg | ORAL_TABLET | Freq: Every day | ORAL | 3 refills | Status: AC

## 2023-10-03 MED ORDER — GLIPIZIDE 5 MG PO TABS: ORAL_TABLET | ORAL | 3 refills | Status: DC

## 2023-10-08 ENCOUNTER — Encounter: Payer: Self-pay | Admitting: Family Medicine

## 2023-10-09 ENCOUNTER — Other Ambulatory Visit (HOSPITAL_COMMUNITY): Payer: Self-pay

## 2023-10-09 ENCOUNTER — Telehealth: Payer: Self-pay | Admitting: Pharmacy Technician

## 2023-10-09 ENCOUNTER — Other Ambulatory Visit: Payer: Self-pay | Admitting: Family Medicine

## 2023-10-09 MED ORDER — BLOOD GLUCOSE MONITORING SUPPL DEVI
1.0000 | Freq: Three times a day (TID) | 0 refills | Status: DC
Start: 2023-10-09 — End: 2023-10-11

## 2023-10-09 MED ORDER — BLOOD GLUCOSE TEST VI STRP: 1.0000 | ORAL_STRIP | Freq: Three times a day (TID) | 0 refills | Status: AC

## 2023-10-09 MED ORDER — LANCETS MISC. MISC: 1.0000 | Freq: Three times a day (TID) | 0 refills | Status: AC

## 2023-10-09 MED ORDER — LANCET DEVICE MISC: 1.0000 | Freq: Three times a day (TID) | 0 refills | Status: AC

## 2023-10-09 NOTE — Telephone Encounter (Signed)
 Pharmacy Patient Advocate Encounter   Received notification from Onbase that prior authorization for Accu-Chek Guide Test strips is required/requested.   Insurance verification completed.   The patient is insured through [facility] ADVANTAGE/RX ADVANCE .   Per test claim:  OneTouch test strips is preferred by the insurance.  If suggested medication is appropriate, Please send in a new RX and discontinue this one. If not, please advise as to why it's not appropriate so that we may request a Prior Authorization. Please note, some preferred medications may still require a PA.  If the suggested medications have not been trialed and there are no contraindications to their use, the PA will not be submitted, as it will not be approved.

## 2023-10-11 ENCOUNTER — Other Ambulatory Visit: Payer: Self-pay

## 2023-10-11 DIAGNOSIS — E119 Type 2 diabetes mellitus without complications: Secondary | ICD-10-CM

## 2023-10-11 MED ORDER — TRUE METRIX BLOOD GLUCOSE TEST VI STRP
1.0000 | ORAL_STRIP | Freq: Every day | 5 refills | Status: AC
Start: 2023-10-11 — End: ?

## 2023-10-11 MED ORDER — BLOOD GLUCOSE MONITORING SUPPL DEVI
1.0000 | Freq: Three times a day (TID) | 0 refills | Status: AC
Start: 2023-10-11 — End: ?

## 2023-10-11 MED ORDER — TRUEPLUS LANCETS 33G MISC
3 refills | Status: AC
Start: 2023-10-11 — End: ?

## 2023-10-12 ENCOUNTER — Other Ambulatory Visit: Payer: Self-pay

## 2023-11-15 ENCOUNTER — Other Ambulatory Visit: Payer: Self-pay | Admitting: Vascular Surgery

## 2023-11-15 DIAGNOSIS — I7143 Infrarenal abdominal aortic aneurysm, without rupture: Secondary | ICD-10-CM

## 2023-11-21 ENCOUNTER — Encounter: Payer: Self-pay | Admitting: Family Medicine

## 2023-11-21 ENCOUNTER — Other Ambulatory Visit: Payer: Self-pay

## 2023-11-21 DIAGNOSIS — E119 Type 2 diabetes mellitus without complications: Secondary | ICD-10-CM

## 2023-11-23 ENCOUNTER — Other Ambulatory Visit: Payer: Self-pay

## 2023-11-23 DIAGNOSIS — N1832 Chronic kidney disease, stage 3b: Secondary | ICD-10-CM

## 2023-11-23 DIAGNOSIS — I10 Essential (primary) hypertension: Secondary | ICD-10-CM

## 2023-11-23 DIAGNOSIS — I2510 Atherosclerotic heart disease of native coronary artery without angina pectoris: Secondary | ICD-10-CM

## 2023-11-23 DIAGNOSIS — E119 Type 2 diabetes mellitus without complications: Secondary | ICD-10-CM

## 2023-11-23 MED ORDER — TIRZEPATIDE 10 MG/0.5ML ~~LOC~~ SOAJ: 10.0000 mg | SUBCUTANEOUS | 0 refills | Status: DC

## 2023-12-07 ENCOUNTER — Ambulatory Visit (INDEPENDENT_AMBULATORY_CARE_PROVIDER_SITE_OTHER)

## 2023-12-07 DIAGNOSIS — Z23 Encounter for immunization: Secondary | ICD-10-CM

## 2023-12-07 NOTE — Progress Notes (Signed)
 Patient is in office today for a nurse visit for Immunization. Patient Injection was given in the  Left deltoid. Patient tolerated injection well.

## 2023-12-12 NOTE — Progress Notes (Deleted)
 HISTORY AND PHYSICAL     CC:  follow up. For EVAR Requesting Provider:  [name], MD  HPI: This is a 83 y.o. male who is here today for follow up for AAA and is s/p EVAR on  06/13/2022 by Dr. Eliza.  He subsequently underwent angiogram with kissing balloon angioplasty and stenting of bilateral CIA for compression of left iliac limb on 08/14/2022 by Dr. Eliza.  Pt was last seen 10/19/2022 and at that time, he was not having any abdominal or back pain. He had palpable femoral pulses and normal ABI and triphasic signal left foot.  The aneurysm had decreased in size and no endoleak.    The pt returns today for follow up studies.   The pt is on a statin for cholesterol management.    The pt is on an aspirin .    Other AC:  plavix  The pt is on CCB, diuretic, ACEI, BB for hypertension.  The pt is  on medication for diabetes. Tobacco hx:  former  Pt does *** have family hx of AAA.  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    50-69% (12/2015)   Neuromuscular disorder (HCC)    L2-3,L5-S1 bulging disc /nerve impingement   PAD (peripheral artery disease)    Sleep apnea     Past Surgical History:  Procedure Laterality Date   ABDOMINAL AORTIC ENDOVASCULAR STENT GRAFT Bilateral 06/13/2022   Procedure: ABDOMINAL AORTIC ENDOVASCULAR STENT GRAFT;  Surgeon: Eliza Lonni RAMAN, MD;  Location: [facility] OR;  Service: Vascular;  Laterality: Bilateral;   ABDOMINAL AORTOGRAM W/LOWER EXTREMITY N/A 08/14/2022   Procedure: ABDOMINAL AORTOGRAM W/LOWER EXTREMITY;  Surgeon: Eliza Lonni RAMAN, MD;  Location: [facility] INVASIVE CV LAB;   Service: Cardiovascular;  Laterality: N/A;   CATARACT EXTRACTION W/ INTRAOCULAR LENS IMPLANT Bilateral    about 3 years ago   COLONOSCOPY N/A 09/27/2012   Procedure: COLONOSCOPY;  Surgeon: Lamar CHRISTELLA Hollingshead, MD;  Location: [facility];  Service: Endoscopy;  Laterality: N/A;  9:30   CORONARY ANGIOPLASTY     1989   PERIPHERAL VASCULAR INTERVENTION Bilateral 08/14/2022   Procedure: PERIPHERAL VASCULAR INTERVENTION;  Surgeon: Eliza Lonni RAMAN, MD;  Location: [facility] INVASIVE CV LAB;  Service: Cardiovascular;  Laterality: Bilateral;  Iliac   TONSILLECTOMY     ULTRASOUND GUIDANCE FOR VASCULAR ACCESS Bilateral 06/13/2022   Procedure: ULTRASOUND GUIDANCE FOR VASCULAR ACCESS;  Surgeon: Eliza Lonni RAMAN, MD;  Location: [facility] OR;  Service: Vascular;  Laterality: Bilateral;    Allergies  Allergen Reactions   Jardiance  [Empagliflozin ] Nausea And Vomiting    Severe nausea-     Current Outpatient Medications  Medication Sig Dispense Refill   acetaminophen  (TYLENOL ) 500 MG tablet Take 1,000 mg by mouth every 6 (six) hours as needed for moderate pain.     allopurinol  (ZYLOPRIM ) 300 MG tablet Take 1 tablet (300 mg total) by mouth daily. 90 tablet 10   aspirin  EC 81 MG tablet Take 81 mg by mouth daily.     Blood Glucose Monitoring Suppl (TRUE METRIX METER) w/Device KIT Use as Directed  1 kit 1   Blood Glucose Monitoring Suppl DEVI 1 each by Does not apply route in the morning, at noon, and at bedtime. May substitute to any manufacturer covered by patient's insurance. Dx code E11.9. 1 each 0   clopidogrel  (PLAVIX ) 75 MG tablet Take 1 tablet (75 mg total) by mouth daily. 30 tablet 2   diltiazem  (CARDIZEM  CD) 240 MG 24 hr capsule Take 1 capsule (240 mg total) by mouth daily. 90 capsule 3   doxazosin  (CARDURA ) 8 MG tablet Take 1 tablet (8 mg total) by mouth daily. 90 tablet 3   fluticasone (FLONASE) 50 MCG/ACT nasal spray Place 2 sprays into both nostrils 2 (two) times daily as needed for allergies or rhinitis.      furosemide  (LASIX ) 20 MG tablet Take 1 tablet (20 mg total) by mouth daily as needed for edema (SOB). 30 tablet 0   glipiZIDE  (GLUCOTROL ) 5 MG tablet TAKE 1 TABLET TWICE DAILY BEFORE MEALS. 90 tablet 3   glucose blood (TRUE METRIX BLOOD GLUCOSE TEST) test strip 1 each by Other route daily. Use to check blood sugars up to four times per day. DX: E11.9 100 strip 5   Lancet Devices (PRODIGY LANCING DEVICE) MISC Use bid DX e11.9 1 each 2   lisinopril  (ZESTRIL ) 40 MG tablet TAKE 1 TABLET EVERY DAY 90 tablet 3   metoprolol  succinate (TOPROL -XL) 50 MG 24 hr tablet Take 1 tablet (50 mg total) by mouth daily. Take with or immediately following a meal. 90 tablet 3   Multiple Vitamin (MULTIVITAMIN WITH MINERALS) TABS Take 1 tablet by mouth daily.     Omega-3 Fatty Acids (FISH OIL) 1200 MG CAPS Take 1,200 mg by mouth 2 (two) times daily.     pantoprazole  (PROTONIX ) 20 MG tablet Take 1 tablet (20 mg total) by mouth daily. 90 tablet 10   rosuvastatin  (CRESTOR ) 20 MG tablet Take 1 tablet (20 mg total) by mouth daily. 90 tablet 3   tirzepatide  (MOUNJARO ) 10 MG/0.5ML Pen Inject 10 mg into the skin once a week. 2 mL 0   TRUEplus Lancets 33G MISC Use to check blood sugars up to four times per day. Dx code E11.9 100 each 3   No current facility-administered medications for this visit.    Family History  Problem Relation Age of Onset   Diabetes Mother    Heart disease Mother        After age 69   Heart attack Mother    Hypertension Mother    Diabetes Father    Heart disease Father        After age 72   Hypertension Father    Heart attack Father    Brain cancer Sister    Cancer Sister        Brain   Diabetes Sister    Hypertension Sister    Cancer Brother        Chest Tumor   Diabetes Brother    Hypertension Brother    Deep vein thrombosis Sister    Diabetes Sister    Diabetes Brother        Bilateral leg   Colon cancer Neg Hx     Social History   Socioeconomic History   Marital status:  Married    Spouse name: Not on file   Number of children: Not on file   Years of education: Not on file   Highest education level: Not on file  Occupational History   Occupation: retired    Associate Professor:  RETIRED    Comment: [employer]   Tobacco Use   Smoking status: Former    Current packs/day: 0.00    Types: Cigarettes    Start date: 01/01/1953    Quit date: 01/02/1988    Years since quitting: 35.9   Smokeless tobacco: Never  Vaping Use   Vaping status: Never Used  Substance and Sexual Activity   Alcohol use: Not Currently    Comment: rare beer, no history of ETOH abuse   Drug use: No   Sexual activity: Not Currently  Other Topics Concern   Not on file  Social History Narrative   Not on file   Social Drivers of Health   Financial Resource Strain: Low Risk  (01/11/2023)   Overall Financial Resource Strain (CARDIA)    Difficulty of Paying Living Expenses: Not hard at all  Food Insecurity: No Food Insecurity (01/11/2023)   Hunger Vital Sign    Worried About Running Out of Food in the Last Year: Never true    Ran Out of Food in the Last Year: Never true  Transportation Needs: No Transportation Needs (01/11/2023)   PRAPARE - Administrator, Civil Service (Medical): No    Lack of Transportation (Non-Medical): No  Physical Activity: Inactive (01/11/2023)   Exercise Vital Sign    Days of Exercise per Week: 0 days    Minutes of Exercise per Session: 0 min  Stress: No Stress Concern Present (01/11/2023)   [employer] of Occupational Health - Occupational Stress Questionnaire    Feeling of Stress : Not at all  Social Connections: Socially Integrated (01/11/2023)   Social Connection and Isolation Panel    Frequency of Communication with Friends and Family: More than three times a week    Frequency of Social Gatherings with Friends and Family: Three times a week    Attends Religious Services: More than 4 times per year    Active Member of Clubs or Organizations:  Yes    Attends Engineer, structural: More than 4 times per year    Marital Status: Married  Catering manager Violence: Not on file     REVIEW OF SYSTEMS:  *** [X]  denotes positive finding, [ ]  denotes negative finding Cardiac  Comments:  Chest pain or chest pressure:    Shortness of breath upon exertion:    Short of breath when lying flat:    Irregular heart rhythm:        Vascular    Pain in calf, thigh, or hip brought on by ambulation:    Pain in feet at night that wakes you up from your sleep:     Blood clot in your veins:    Leg swelling:         Pulmonary    Oxygen at home:    Productive cough:     Wheezing:         Neurologic    Sudden weakness in arms or legs:     Sudden numbness in arms or legs:     Sudden onset of difficulty speaking or slurred speech:    Temporary loss of vision in one eye:     Problems with dizziness:         Gastrointestinal    Blood in stool:     Vomited blood:         Genitourinary    Burning when urinating:     Blood in urine:        Psychiatric  Major depression:         Hematologic    Bleeding problems:    Problems with blood clotting too easily:        Skin    Rashes or ulcers:        Constitutional    Fever or chills:      PHYSICAL EXAMINATION:  ***  General:  WDWN in NAD; vital signs documented above Gait: Not observed HENT: WNL, normocephalic Pulmonary: normal non-labored breathing  Cardiac: {Desc; regular/irreg:14544} HR;  {With/Without:20273} carotid bruit*** Abdomen: soft, NT; aortic pulse is *** palpable Skin: {With/Without:20273} rashes Vascular Exam/Pulses:  Right Left  Radial {Exam; arterial pulse strength 0-4:30167} {Exam; arterial pulse strength 0-4:30167}  Femoral {Exam; arterial pulse strength 0-4:30167} {Exam; arterial pulse strength 0-4:30167}  Popliteal {Exam; arterial pulse strength 0-4:30167} {Exam; arterial pulse strength 0-4:30167}  DP {Exam; arterial pulse strength 0-4:30167}  {Exam; arterial pulse strength 0-4:30167}  PT {Exam; arterial pulse strength 0-4:30167} {Exam; arterial pulse strength 0-4:30167}   Extremities: {With/Without:20273} open wounds Musculoskeletal: no muscle wasting or atrophy  Neurologic: A&O X 3 Psychiatric:  The pt has {Desc; normal/abnormal:11317::Normal} affect.   Non-Invasive Vascular Imaging:   EVAR Arterial duplex on 12/14/2023: ***  Previous EVAR arterial duplex on 10/19/2022: Abdominal Aorta: The largest aortic diameter has decreased compared to prior CT exam. Unable to evaluate narrowing of the left iliac limb   ABI 10/19/2022 +-------+-----------+-----------+------------+------------+  ABI/TBIToday's ABIToday's TBIPrevious ABIPrevious TBI  +-------+-----------+-----------+------------+------------+  Right [place]         0.94       [place]          0.86          +-------+-----------+-----------+------------+------------+  Left  [place]         1.10                 0.60          +-------+-----------+-----------+------------+------------+     ASSESSMENT/PLAN:: 83 y.o. male here with hx of EVAR on  06/13/2022 by Dr. Eliza.  He subsequently underwent angiogram with kissing balloon angioplasty and stenting of bilateral CIA for compression of left iliac limb on 08/14/2022 by Dr. Eliza.  -*** -continue asa/statin/plavix  -pt will f/u in *** with ***.   Lucie Apt, [facility] [phone]  Clinic MD:   [place]

## 2023-12-14 ENCOUNTER — Other Ambulatory Visit (HOSPITAL_COMMUNITY)

## 2023-12-14 ENCOUNTER — Ambulatory Visit

## 2023-12-14 ENCOUNTER — Encounter (HOSPITAL_COMMUNITY)

## 2023-12-18 ENCOUNTER — Other Ambulatory Visit: Payer: Self-pay | Admitting: Family Medicine

## 2023-12-18 DIAGNOSIS — N1832 Chronic kidney disease, stage 3b: Secondary | ICD-10-CM

## 2023-12-18 DIAGNOSIS — E119 Type 2 diabetes mellitus without complications: Secondary | ICD-10-CM

## 2023-12-18 DIAGNOSIS — I2510 Atherosclerotic heart disease of native coronary artery without angina pectoris: Secondary | ICD-10-CM

## 2023-12-18 DIAGNOSIS — I10 Essential (primary) hypertension: Secondary | ICD-10-CM

## 2023-12-24 ENCOUNTER — Ambulatory Visit: Admitting: Family Medicine

## 2023-12-24 ENCOUNTER — Encounter: Payer: Self-pay | Admitting: Family Medicine

## 2023-12-24 VITALS — BP 146/58 | HR 86 | Temp 97.3°F | Ht 70.0 in | Wt 227.0 lb

## 2023-12-24 DIAGNOSIS — I10 Essential (primary) hypertension: Secondary | ICD-10-CM

## 2023-12-24 DIAGNOSIS — E119 Type 2 diabetes mellitus without complications: Secondary | ICD-10-CM | POA: Diagnosis not present

## 2023-12-24 DIAGNOSIS — Z7984 Long term (current) use of oral hypoglycemic drugs: Secondary | ICD-10-CM

## 2023-12-24 DIAGNOSIS — N1832 Chronic kidney disease, stage 3b: Secondary | ICD-10-CM | POA: Diagnosis not present

## 2023-12-24 NOTE — Progress Notes (Signed)
 Subjective:    Patient ID: Juan [name], male    DOB: 09-27-1940, 83 y.o.   MRN: [id number]  Patient is here today for follow-up with diabetes, hypertension, and hyperlipidemia.  He reports fasting blood sugars in the 90-120 range.  Patient is currently on Mounjaro  10 mg subcu weekly.  He denies any abdominal pain.  He denies any nausea or vomiting.  He has no interest in increasing the dose of his Mounjaro .   Wt Readings from Last 3 Encounters:  12/24/23 227 lb (103 kg)  08/23/23 237 lb 3.2 oz (107.6 kg)  05/25/23 243 lb 3.2 oz (110.3 kg)   Patient has lost 10 pounds since his last visit.  Patient last saw his vascular surgeon in August 2024.  He has a history of a AAA repair.  He missed his last appointment.  He needs to reschedule this.  He denies any angina.  He denies any chest pain.  He denies any shortness of breath until exertion.  Continuous numbness and tingling and mild edema in both legs. 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    50-69% (12/2015)   Neuromuscular disorder (HCC)    L2-3,L5-S1 bulging disc /nerve impingement   PAD (peripheral artery disease)    Sleep apnea    Past Surgical History:  Procedure Laterality Date   ABDOMINAL AORTIC ENDOVASCULAR STENT GRAFT Bilateral 06/13/2022   Procedure: ABDOMINAL AORTIC ENDOVASCULAR STENT GRAFT;  Surgeon: Eliza Lonni RAMAN, MD;  Location: [facility] OR;  Service: Vascular;  Laterality: Bilateral;   ABDOMINAL AORTOGRAM W/LOWER EXTREMITY N/A 08/14/2022   Procedure: ABDOMINAL AORTOGRAM W/LOWER EXTREMITY;  Surgeon: Eliza Lonni RAMAN, MD;  Location: [facility]  INVASIVE CV LAB;  Service: Cardiovascular;  Laterality: N/A;   CATARACT EXTRACTION W/ INTRAOCULAR LENS IMPLANT Bilateral    about 3 years ago   COLONOSCOPY N/A 09/27/2012   Procedure: COLONOSCOPY;  Surgeon: Lamar CHRISTELLA Hollingshead, MD;  Location: [facility];  Service: Endoscopy;  Laterality: N/A;  9:30   CORONARY ANGIOPLASTY     1989   PERIPHERAL VASCULAR INTERVENTION Bilateral 08/14/2022   Procedure: PERIPHERAL VASCULAR INTERVENTION;  Surgeon: Eliza Lonni RAMAN, MD;  Location: [facility] INVASIVE CV LAB;  Service: Cardiovascular;  Laterality: Bilateral;  Iliac   TONSILLECTOMY     ULTRASOUND GUIDANCE FOR VASCULAR ACCESS Bilateral 06/13/2022   Procedure: ULTRASOUND GUIDANCE FOR VASCULAR ACCESS;  Surgeon: Eliza Lonni RAMAN, MD;  Location: [facility] OR;  Service: Vascular;  Laterality: Bilateral;   Current Outpatient Medications on File Prior to Visit  Medication Sig Dispense Refill   acetaminophen  (TYLENOL ) 500 MG tablet Take 1,000 mg by mouth every 6 (six) hours as needed for moderate pain.     allopurinol  (ZYLOPRIM ) 300 MG tablet Take 1 tablet (300 mg total) by mouth daily. 90 tablet 10   aspirin  EC 81 MG tablet Take 81 mg by mouth daily.     Blood Glucose Monitoring Suppl (TRUE METRIX METER) w/Device KIT Use as Directed 1 kit 1   Blood Glucose Monitoring Suppl DEVI 1 each by Does not apply route in the morning,  at noon, and at bedtime. May substitute to any manufacturer covered by patient's insurance. Dx code E11.9. 1 each 0   clopidogrel  (PLAVIX ) 75 MG tablet Take 1 tablet (75 mg total) by mouth daily. 30 tablet 2   diltiazem  (CARDIZEM  CD) 240 MG 24 hr capsule Take 1 capsule (240 mg total) by mouth daily. 90 capsule 3   doxazosin  (CARDURA ) 8 MG tablet Take 1 tablet (8 mg total) by mouth daily. 90 tablet 3   fluticasone (FLONASE) 50 MCG/ACT nasal spray Place 2 sprays into both nostrils 2 (two) times daily as needed for allergies or rhinitis.     furosemide  (LASIX ) 20 MG tablet Take 1 tablet (20 mg total)  by mouth daily as needed for edema (SOB). 30 tablet 0   glipiZIDE  (GLUCOTROL ) 5 MG tablet TAKE 1 TABLET TWICE DAILY BEFORE MEALS. 90 tablet 3   glucose blood (TRUE METRIX BLOOD GLUCOSE TEST) test strip 1 each by Other route daily. Use to check blood sugars up to four times per day. DX: E11.9 100 strip 5   Lancet Devices (PRODIGY LANCING DEVICE) MISC Use bid DX e11.9 1 each 2   lisinopril  (ZESTRIL ) 40 MG tablet TAKE 1 TABLET EVERY DAY 90 tablet 3   metoprolol  succinate (TOPROL -XL) 50 MG 24 hr tablet Take 1 tablet (50 mg total) by mouth daily. Take with or immediately following a meal. 90 tablet 3   MOUNJARO  10 MG/0.5ML Pen INJECT 10MG  SUBCUTANEOUSLY  ONCE A WEEK 4 mL 0   Multiple Vitamin (MULTIVITAMIN WITH MINERALS) TABS Take 1 tablet by mouth daily.     Omega-3 Fatty Acids (FISH OIL) 1200 MG CAPS Take 1,200 mg by mouth 2 (two) times daily.     pantoprazole  (PROTONIX ) 20 MG tablet Take 1 tablet (20 mg total) by mouth daily. 90 tablet 10   rosuvastatin  (CRESTOR ) 20 MG tablet Take 1 tablet (20 mg total) by mouth daily. 90 tablet 3   TRUEplus Lancets 33G MISC Use to check blood sugars up to four times per day. Dx code E11.9 100 each 3   No current facility-administered medications on file prior to visit.   Allergies  Allergen Reactions   Jardiance  [Empagliflozin ] Nausea And Vomiting    Severe nausea-    Social History   Socioeconomic History   Marital status: Married    Spouse name: Not on file   Number of children: Not on file   Years of education: Not on file   Highest education level: Not on file  Occupational History   Occupation: retired    Associate Professor: RETIRED    Comment: Automotive   Tobacco Use   Smoking status: Former    Current packs/day: 0.00    Types: Cigarettes    Start date: 01/01/1953    Quit date: 01/02/1988    Years since quitting: 36.0   Smokeless tobacco: Never  Vaping Use   Vaping status: Never Used  Substance and Sexual Activity   Alcohol use: Not Currently     Comment: rare beer, no history of ETOH abuse   Drug use: No   Sexual activity: Not Currently  Other Topics Concern   Not on file  Social History Narrative   Not on file   Social Drivers of Health   Financial Resource Strain: Low Risk  (01/11/2023)   Overall Financial Resource Strain (CARDIA)    Difficulty of Paying Living Expenses: Not hard at all  Food Insecurity: No Food Insecurity (01/11/2023)   Hunger Vital Sign  Worried About Programme researcher, broadcasting/film/video in the Last Year: Never true    Ran Out of Food in the Last Year: Never true  Transportation Needs: No Transportation Needs (01/11/2023)   PRAPARE - Administrator, Civil Service (Medical): No    Lack of Transportation (Non-Medical): No  Physical Activity: Inactive (01/11/2023)   Exercise Vital Sign    Days of Exercise per Week: 0 days    Minutes of Exercise per Session: 0 min  Stress: No Stress Concern Present (01/11/2023)   [employer] of Occupational Health - Occupational Stress Questionnaire    Feeling of Stress : Not at all  Social Connections: Socially Integrated (01/11/2023)   Social Connection and Isolation Panel    Frequency of Communication with Friends and Family: More than three times a week    Frequency of Social Gatherings with Friends and Family: Three times a week    Attends Religious Services: More than 4 times per year    Active Member of Clubs or Organizations: Yes    Attends Engineer, structural: More than 4 times per year    Marital Status: Married  Catering manager Violence: Not on file   Family History  Problem Relation Age of Onset   Diabetes Mother    Heart disease Mother        After age 6   Heart attack Mother    Hypertension Mother    Diabetes Father    Heart disease Father        After age 28   Hypertension Father    Heart attack Father    Brain cancer Sister    Cancer Sister        Brain   Diabetes Sister    Hypertension Sister    Cancer Brother         Chest Tumor   Diabetes Brother    Hypertension Brother    Deep vein thrombosis Sister    Diabetes Sister    Diabetes Brother        Bilateral leg   Colon cancer Neg Hx       Review of Systems  All other systems reviewed and are negative.      Objective:   Physical Exam Vitals reviewed.  Constitutional:      General: He is not in acute distress.    Appearance: He is well-developed. He is not diaphoretic.  HENT:     Head: Normocephalic and atraumatic.  Neck:     Thyroid : No thyromegaly.     Vascular: No JVD.     Trachea: No tracheal deviation.  Cardiovascular:     Rate and Rhythm: Normal rate and regular rhythm.     Heart sounds: Murmur heard.     No friction rub. No gallop.  Pulmonary:     Effort: Pulmonary effort is normal. No respiratory distress.     Breath sounds: Normal breath sounds. No stridor. No wheezing or rales.  Chest:     Chest wall: No tenderness.  Abdominal:     General: Bowel sounds are normal.     Palpations: Abdomen is soft.  Musculoskeletal:     Right lower leg: Edema present.     Left lower leg: Edema present.  Neurological:     Mental Status: He is alert and oriented to person, place, and time.     Cranial Nerves: No cranial nerve deficit.     Motor: No abnormal muscle tone.     Coordination: Coordination normal.  Deep Tendon Reflexes: Reflexes are normal and symmetric.  Psychiatric:        Behavior: Behavior normal.        Thought Content: Thought content normal.        Judgment: Judgment normal.    Patient has diminished dorsalis pedis and posterior tibialis pulses bilaterally but he denies any symptoms of claudication       Assessment & Plan:  Controlled type 2 diabetes mellitus without complication, without long-term current use of insulin  (HCC) - Plan: Hemoglobin A1c, CBC with Differential/Platelet, Comprehensive metabolic panel with GFR, Lipid panel, Microalbumin/Creatinine Ratio, Urine  Stage 3b chronic kidney disease  (HCC)  Essential hypertension Blood pressure here is elevated however his blood pressure at home is typically 110-130 systolic.  This is acceptable.  He has peripheral artery disease but denies any claudication.  His fasting blood sugars sound well-controlled.  I will check a CBC a CMP and an A1c and a lipid panel along with a urine microalbumin creatinine ratio.  I would like to see his A1c less than 7, his LDL less than 70, and his microalbumin to creatinine ratio less than 30.  Recommended RSV vaccine and the COVID-vaccine.  Flu shot is up-to-date.

## 2023-12-25 ENCOUNTER — Ambulatory Visit: Payer: Self-pay | Admitting: Family Medicine

## 2023-12-25 LAB — COMPREHENSIVE METABOLIC PANEL WITH GFR
AG Ratio: 1.8 (calc) (ref 1.0–2.5)
ALT: 18 U/L (ref 9–46)
AST: 18 U/L (ref 10–35)
Albumin: 4.1 g/dL (ref 3.6–5.1)
Alkaline phosphatase (APISO): 66 U/L (ref 35–144)
BUN/Creatinine Ratio: 14 (calc) (ref 6–22)
BUN: 27 mg/dL — ABNORMAL HIGH (ref 7–25)
CO2: 26 mmol/L (ref 20–32)
Calcium: 9.2 mg/dL (ref 8.6–10.3)
Chloride: 108 mmol/L (ref 98–110)
Creat: 1.92 mg/dL — ABNORMAL HIGH (ref 0.70–1.22)
Globulin: 2.3 g/dL (ref 1.9–3.7)
Glucose, Bld: 154 mg/dL — ABNORMAL HIGH (ref 65–99)
Potassium: 4.8 mmol/L (ref 3.5–5.3)
Sodium: 140 mmol/L (ref 135–146)
Total Bilirubin: 0.4 mg/dL (ref 0.2–1.2)
Total Protein: 6.4 g/dL (ref 6.1–8.1)
eGFR: 34 mL/min/1.73m2 — ABNORMAL LOW (ref >=60)

## 2023-12-25 LAB — LIPID PANEL
Cholesterol: 113 mg/dL (ref <200)
HDL: 40 mg/dL (ref >=40)
LDL Cholesterol (Calc): 52 mg/dL
Non-HDL Cholesterol (Calc): 73 mg/dL (ref <130)
Total CHOL/HDL Ratio: 2.8 (calc) (ref <5.0)
Triglycerides: 128 mg/dL (ref <150)

## 2023-12-25 LAB — CBC WITH DIFFERENTIAL/PLATELET
Absolute Lymphocytes: 1709 {cells}/uL (ref 850–3900)
Absolute Monocytes: 541 {cells}/uL (ref 200–950)
Basophils Absolute: 33 {cells}/uL (ref 0–200)
Basophils Relative: 0.5 %
Eosinophils Absolute: 191 {cells}/uL (ref 15–500)
Eosinophils Relative: 2.9 %
HCT: 34.1 % — ABNORMAL LOW (ref 38.5–50.0)
Hemoglobin: 11.2 g/dL — ABNORMAL LOW (ref 13.2–17.1)
MCH: 31.8 pg (ref 27.0–33.0)
MCHC: 32.8 g/dL (ref 32.0–36.0)
MCV: 96.9 fL (ref 80.0–100.0)
MPV: 10.6 fL (ref 7.5–12.5)
Monocytes Relative: 8.2 %
Neutro Abs: 4125 {cells}/uL (ref 1500–7800)
Neutrophils Relative %: 62.5 %
Platelets: 137 Thousand/uL — ABNORMAL LOW (ref 140–400)
RBC: 3.52 Million/uL — ABNORMAL LOW (ref 4.20–5.80)
RDW: 13.7 % (ref 11.0–15.0)
Total Lymphocyte: 25.9 %
WBC: 6.6 Thousand/uL (ref 3.8–10.8)

## 2023-12-25 LAB — HEMOGLOBIN A1C
Hgb A1c MFr Bld: 6.1 % — ABNORMAL HIGH (ref <5.7)
Mean Plasma Glucose: 128 mg/dL
eAG (mmol/L): 7.1 mmol/L

## 2023-12-25 LAB — MICROALBUMIN / CREATININE URINE RATIO
Creatinine, Urine: 369 mg/dL — ABNORMAL HIGH (ref 20–320)
Microalb Creat Ratio: 80 mg/g{creat} — ABNORMAL HIGH (ref <30)
Microalb, Ur: 29.6 mg/dL

## 2023-12-26 ENCOUNTER — Encounter: Payer: Self-pay | Admitting: Family Medicine

## 2023-12-27 ENCOUNTER — Encounter: Admitting: Family Medicine

## 2024-01-10 ENCOUNTER — Encounter: Payer: Self-pay | Admitting: *Deleted

## 2024-01-10 NOTE — Progress Notes (Signed)
 Juan [name]                                          MRN: [id number]   01/10/2024   The [facility] Quality Team Specialist reviewed this patient medical record for the purposes of chart review for care gap closure. The following were reviewed: abstraction for care gap closure-kidney health evaluation for diabetes:eGFR  and uACR.    [facility] Quality Team

## 2024-01-16 ENCOUNTER — Encounter: Payer: Self-pay | Admitting: Family Medicine

## 2024-01-16 ENCOUNTER — Other Ambulatory Visit: Payer: Self-pay

## 2024-01-16 DIAGNOSIS — N1832 Chronic kidney disease, stage 3b: Secondary | ICD-10-CM

## 2024-01-16 DIAGNOSIS — I10 Essential (primary) hypertension: Secondary | ICD-10-CM

## 2024-01-16 DIAGNOSIS — I2510 Atherosclerotic heart disease of native coronary artery without angina pectoris: Secondary | ICD-10-CM

## 2024-01-16 DIAGNOSIS — E119 Type 2 diabetes mellitus without complications: Secondary | ICD-10-CM

## 2024-01-16 MED ORDER — MOUNJARO 10 MG/0.5ML ~~LOC~~ SOAJ: 10.0000 mg | SUBCUTANEOUS | 0 refills | Status: DC

## 2024-01-16 MED ORDER — DILTIAZEM HCL ER COATED BEADS 240 MG PO CP24: 240.0000 mg | ORAL_CAPSULE | Freq: Every day | ORAL | 3 refills | Status: DC

## 2024-01-17 ENCOUNTER — Ambulatory Visit: Payer: Medicare HMO | Admitting: *Deleted

## 2024-01-17 VITALS — Ht 70.0 in | Wt 227.0 lb

## 2024-01-17 DIAGNOSIS — Z0000 Encounter for general adult medical examination without abnormal findings: Secondary | ICD-10-CM

## 2024-01-17 NOTE — Patient Instructions (Signed)
 Juan [name] , Thank you for taking time to come for your Medicare Wellness Visit. I appreciate your ongoing commitment to your health goals. Please review the following plan we discussed and let me know if I can assist you in the future.   Screening recommendations/referrals: Colonoscopy:  Recommended yearly ophthalmology/optometry visit for glaucoma screening and checkup Recommended yearly dental visit for hygiene and checkup  Vaccinations: Influenza vaccine:  Pneumococcal vaccine:  Tdap vaccine:  Shingles vaccine:       Preventive Care 65 Years and Older, Male Preventive care refers to lifestyle choices and visits with your health care provider that can promote health and wellness. What does preventive care include? A yearly physical exam. This is also called an annual well check. Dental exams once or twice a year. Routine eye exams. Ask your health care provider how often you should have your eyes checked. Personal lifestyle choices, including: Daily care of your teeth and gums. Regular physical activity. Eating a healthy diet. Avoiding tobacco and drug use. Limiting alcohol use. Practicing safe sex. Taking low doses of aspirin  every day. Taking vitamin and mineral supplements as recommended by your health care provider. What happens during an annual well check? The services and screenings done by your health care provider during your annual well check will depend on your age, overall health, lifestyle risk factors, and family history of disease. Counseling  Your health care provider may ask you questions about your: Alcohol use. Tobacco use. Drug use. Emotional well-being. Home and relationship well-being. Sexual activity. Eating habits. History of falls. Memory and ability to understand (cognition). Work and work astronomer. Screening  You may have the following tests or measurements: Height, weight, and BMI. Blood pressure. Lipid and cholesterol levels.  These may be checked every 5 years, or more frequently if you are over 69 years old. Skin check. Lung cancer screening. You may have this screening every year starting at age 77 if you have a 30-pack-year history of smoking and currently smoke or have quit within the past 15 years. Fecal occult blood test (FOBT) of the stool. You may have this test every year starting at age 73. Flexible sigmoidoscopy or colonoscopy. You may have a sigmoidoscopy every 5 years or a colonoscopy every 10 years starting at age 19. Prostate cancer screening. Recommendations will vary depending on your family history and other risks. Hepatitis C blood test. Hepatitis B blood test. Sexually transmitted disease (STD) testing. Diabetes screening. This is done by checking your blood sugar (glucose) after you have not eaten for a while (fasting). You may have this done every 1-3 years. Abdominal aortic aneurysm (AAA) screening. You may need this if you are a current or former smoker. Osteoporosis. You may be screened starting at age 3 if you are at high risk. Talk with your health care provider about your test results, treatment options, and if necessary, the need for more tests. Vaccines  Your health care provider may recommend certain vaccines, such as: Influenza vaccine. This is recommended every year. Tetanus, diphtheria, and acellular pertussis (Tdap, Td) vaccine. You may need a Td booster every 10 years. Zoster vaccine. You may need this after age 49. Pneumococcal 13-valent conjugate (PCV13) vaccine. One dose is recommended after age 80. Pneumococcal polysaccharide (PPSV23) vaccine. One dose is recommended after age 66. Talk to your health care provider about which screenings and vaccines you need and how often you need them. This information is not intended to replace advice given to you by your health care  provider. Make sure you discuss any questions you have with your health care provider. Document Released:  03/26/2015 Document Revised: 11/17/2015 Document Reviewed: 12/29/2014 Elsevier Interactive Patient Education  2017 [employer].  Fall Prevention in the Home Falls can cause injuries. They can happen to people of all ages. There are many things you can do to make your home safe and to help prevent falls. What can I do on the outside of my home? Regularly fix the edges of walkways and driveways and fix any cracks. Remove anything that might make you trip as you walk through a door, such as a raised step or threshold. Trim any bushes or trees on the path to your home. Use bright outdoor lighting. Clear any walking paths of anything that might make someone trip, such as rocks or tools. Regularly check to see if handrails are loose or broken. Make sure that both sides of any steps have handrails. Any raised decks and porches should have guardrails on the edges. Have any leaves, snow, or ice cleared regularly. Use sand or salt on walking paths during winter. Clean up any spills in your garage right away. This includes oil or grease spills. What can I do in the bathroom? Use night lights. Install grab bars by the toilet and in the tub and shower. Do not use towel bars as grab bars. Use non-skid mats or decals in the tub or shower. If you need to sit down in the shower, use a plastic, non-slip stool. Keep the floor dry. Clean up any water that spills on the floor as soon as it happens. Remove soap buildup in the tub or shower regularly. Attach bath mats securely with double-sided non-slip rug tape. Do not have throw rugs and other things on the floor that can make you trip. What can I do in the bedroom? Use night lights. Make sure that you have a light by your bed that is easy to reach. Do not use any sheets or blankets that are too big for your bed. They should not hang down onto the floor. Have a firm chair that has side arms. You can use this for support while you get dressed. Do not have  throw rugs and other things on the floor that can make you trip. What can I do in the kitchen? Clean up any spills right away. Avoid walking on wet floors. Keep items that you use a lot in easy-to-reach places. If you need to reach something above you, use a strong step stool that has a grab bar. Keep electrical cords out of the way. Do not use floor polish or wax that makes floors slippery. If you must use wax, use non-skid floor wax. Do not have throw rugs and other things on the floor that can make you trip. What can I do with my stairs? Do not leave any items on the stairs. Make sure that there are handrails on both sides of the stairs and use them. Fix handrails that are broken or loose. Make sure that handrails are as long as the stairways. Check any carpeting to make sure that it is firmly attached to the stairs. Fix any carpet that is loose or worn. Avoid having throw rugs at the top or bottom of the stairs. If you do have throw rugs, attach them to the floor with carpet tape. Make sure that you have a light switch at the top of the stairs and the bottom of the stairs. If you do not have  them, ask someone to add them for you. What else can I do to help prevent falls? Wear shoes that: Do not have high heels. Have rubber bottoms. Are comfortable and fit you well. Are closed at the toe. Do not wear sandals. If you use a stepladder: Make sure that it is fully opened. Do not climb a closed stepladder. Make sure that both sides of the stepladder are locked into place. Ask someone to hold it for you, if possible. Clearly mark and make sure that you can see: Any grab bars or handrails. First and last steps. Where the edge of each step is. Use tools that help you move around (mobility aids) if they are needed. These include: Canes. Walkers. Scooters. Crutches. Turn on the lights when you go into a dark area. Replace any light bulbs as soon as they burn out. Set up your furniture so  you have a clear path. Avoid moving your furniture around. If any of your floors are uneven, fix them. If there are any pets around you, be aware of where they are. Review your medicines with your doctor. Some medicines can make you feel dizzy. This can increase your chance of falling. Ask your doctor what other things that you can do to help prevent falls. This information is not intended to replace advice given to you by your health care provider. Make sure you discuss any questions you have with your health care provider. Document Released: 12/24/2008 Document Revised: 08/05/2015 Document Reviewed: 04/03/2014 Elsevier Interactive Patient Education  2017 [employer].

## 2024-01-17 NOTE — Progress Notes (Signed)
 Subjective:   Juan [name] is a 83 y.o. male who presents for a Medicare Annual Wellness Visit.  I connected with  Christopher Messing on 01/17/24 by a audio enabled telemedicine application and verified that I am speaking with the correct person using two identifiers.  Patient Location: Home  Provider Location: Home Office  I discussed the limitations of evaluation and management by telemedicine. The patient expressed understanding and agreed to proceed.   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    50-69% (12/2015)   Neuromuscular disorder (HCC)    L2-3,L5-S1 bulging disc /nerve impingement   PAD (peripheral artery disease)    Sleep apnea    Past Surgical History:  Procedure Laterality Date   ABDOMINAL AORTIC ENDOVASCULAR STENT GRAFT Bilateral 06/13/2022   Procedure: ABDOMINAL AORTIC ENDOVASCULAR STENT GRAFT;  Surgeon: Eliza Lonni RAMAN, MD;  Location: [facility] OR;  Service: Vascular;  Laterality: Bilateral;   ABDOMINAL AORTOGRAM W/LOWER EXTREMITY N/A 08/14/2022   Procedure: ABDOMINAL AORTOGRAM W/LOWER EXTREMITY;  Surgeon: Eliza Lonni RAMAN, MD;  Location: [facility] INVASIVE CV LAB;  Service: Cardiovascular;  Laterality: N/A;   CATARACT EXTRACTION W/ INTRAOCULAR LENS IMPLANT Bilateral    about 3 years ago   COLONOSCOPY N/A 09/27/2012   Procedure: COLONOSCOPY;  Surgeon: Lamar CHRISTELLA Hollingshead, MD;  Location: [facility] ENDO SUITE;  Service: Endoscopy;  Laterality: N/A;  9:30   CORONARY ANGIOPLASTY     1989   PERIPHERAL VASCULAR INTERVENTION Bilateral 08/14/2022    Procedure: PERIPHERAL VASCULAR INTERVENTION;  Surgeon: Eliza Lonni RAMAN, MD;  Location: [facility] INVASIVE CV LAB;  Service: Cardiovascular;  Laterality: Bilateral;  Iliac   TONSILLECTOMY     ULTRASOUND GUIDANCE FOR VASCULAR ACCESS Bilateral 06/13/2022   Procedure: ULTRASOUND GUIDANCE FOR VASCULAR ACCESS;  Surgeon: Eliza Lonni RAMAN, MD;  Location: [facility] OR;  Service: Vascular;  Laterality: Bilateral;   Family History  Problem Relation Age of Onset   Diabetes Mother    Heart disease Mother        After age 8   Heart attack Mother    Hypertension Mother    Diabetes Father    Heart disease Father        After age 16   Hypertension Father    Heart attack Father    Brain cancer Sister    Cancer Sister        Brain   Diabetes Sister    Hypertension Sister    Cancer Brother        Chest Tumor   Diabetes Brother    Hypertension Brother    Deep vein thrombosis Sister    Diabetes Sister    Diabetes Brother        Bilateral leg   Colon cancer Neg Hx    Social History   Occupational History   Occupation: retired    Associate Professor: RETIRED    Comment: [employer]   Tobacco Use   Smoking status: Former    Current packs/day: 0.00    Types: Cigarettes    Start date: 01/01/1953  Quit date: 01/02/1988    Years since quitting: 36.0   Smokeless tobacco: Never  Vaping Use   Vaping status: Never Used  Substance and Sexual Activity   Alcohol use: Not Currently    Comment: rare beer, no history of ETOH abuse   Drug use: No   Sexual activity: Not Currently   Tobacco Counseling Counseling given: Not Answered  SDOH Screenings   Food Insecurity: No Food Insecurity (01/17/2024)  Housing: Unknown (01/17/2024)  Transportation Needs: No Transportation Needs (01/17/2024)  Utilities: Not At Risk (01/17/2024)  Alcohol Screen: Low Risk  (01/11/2023)  Depression (PHQ2-9): Low Risk  (01/17/2024)  Financial Resource Strain: Low Risk  (01/11/2023)  Physical Activity: Inactive (01/17/2024)  Social  Connections: Socially Integrated (01/17/2024)  Stress: No Stress Concern Present (01/17/2024)  Tobacco Use: Medium Risk (01/17/2024)  Health Literacy: Adequate Health Literacy (01/17/2024)   Depression Screen    01/17/2024   10:53 AM 12/24/2023   11:05 AM 01/11/2023    2:02 PM 12/15/2021   10:29 AM 12/13/2020    2:57 PM 09/17/2020    2:23 PM 09/17/2020    2:22 PM  PHQ 2/9 Scores  PHQ - 2 Score 0 0 0 0 0 0 0  PHQ- 9 Score 0            Data saved with a previous flowsheet row definition     Goals Addressed   None    Visit info / Clinical Intake: Medicare Wellness Visit Type:: Subsequent Annual Wellness Visit Medicare Wellness Visit Mode:: Telephone If telephone:: video declined Interpreter Needed?: No Pre-visit prep was completed: no AWV questionnaire completed by patient prior to visit?: yes Date:: 01/16/24 Living arrangements:: lives with spouse/significant other Patient's Overall Health Status Rating: good Typical amount of pain: none Does pain affect daily life?: no Are you currently prescribed opioids?: no  Dietary Habits and Nutritional Risks How many meals a day?: 3 Eats fruit and vegetables daily?: yes Most meals are obtained by: preparing own meals; eating out Diabetic:: (!) yes Any non-healing wounds?: no How often do you check your BS?: 2 Would you like to be referred to a Nutritionist or for Diabetic Management? : no  Functional Status Activities of Daily Living (to include ambulation/medication): Independent Ambulation: Independent Medication Administration: Independent Home Management: Independent Manage your own finances?: (!) no Primary transportation is: driving Concerns about vision?: no *vision screening is required for WTM* Concerns about hearing?: no  Fall Screening Falls in the past year?: 0 Number of falls in past year: 0 Was there an injury with Fall?: 0 Fall Risk Category Calculator: 0 Patient Fall Risk Level: Low Fall Risk  Fall  Risk Patient at Risk for Falls Due to: No Fall Risks Fall risk Follow up: Falls evaluation completed; Education provided; Falls prevention discussed  Home and Transportation Safety: All rugs have non-skid backing?: (!) no All stairs or steps have railings?: (!) no Grab bars in the bathtub or shower?: yes Have non-skid surface in bathtub or shower?: (!) no Good home lighting?: yes Regular seat belt use?: yes Hospital stays in the last year:: no  Cognitive Assessment Difficulty concentrating, remembering, or making decisions? : no Will 6CIT or Mini Cog be Completed: yes What year is it?: 0 points What month is it?: 0 points Give patient an address phrase to remember (5 components): Its very sunny outside today in November About what time is it?: 0 points Count backwards from 20 to 1: 0 points Say the months of the year in reverse:  0 points Repeat the address phrase from earlier: 0 points 6 CIT Score: 0 points  Advance Directives (For Healthcare) Does Patient Have a Medical Advance Directive?: Yes Type of Advance Directive: Healthcare Power of Attorney  Reviewed/Updated  Reviewed/Updated: All        Objective:    Today's Vitals   01/17/24 1050  Weight: 227 lb (103 kg)  Height: 5' 10 (1.778 m)   Body mass index is 32.57 kg/m.  Current Medications (verified) Outpatient Encounter Medications as of 01/17/2024  Medication Sig   acetaminophen  (TYLENOL ) 500 MG tablet Take 1,000 mg by mouth every 6 (six) hours as needed for moderate pain.   allopurinol  (ZYLOPRIM ) 300 MG tablet Take 1 tablet (300 mg total) by mouth daily.   aspirin  EC 81 MG tablet Take 81 mg by mouth daily.   Blood Glucose Monitoring Suppl (TRUE METRIX METER) w/Device KIT Use as Directed   Blood Glucose Monitoring Suppl DEVI 1 each by Does not apply route in the morning, at noon, and at bedtime. May substitute to any manufacturer covered by patient's insurance. Dx code E11.9.   clopidogrel  (PLAVIX ) 75 MG  tablet Take 1 tablet (75 mg total) by mouth daily.   diltiazem  (CARDIZEM  CD) 240 MG 24 hr capsule Take 1 capsule (240 mg total) by mouth daily.   doxazosin  (CARDURA ) 8 MG tablet Take 1 tablet (8 mg total) by mouth daily.   fluticasone (FLONASE) 50 MCG/ACT nasal spray Place 2 sprays into both nostrils 2 (two) times daily as needed for allergies or rhinitis.   furosemide  (LASIX ) 20 MG tablet Take 1 tablet (20 mg total) by mouth daily as needed for edema (SOB).   glipiZIDE  (GLUCOTROL ) 5 MG tablet TAKE 1 TABLET TWICE DAILY BEFORE MEALS.   glucose blood (TRUE METRIX BLOOD GLUCOSE TEST) test strip 1 each by Other route daily. Use to check blood sugars up to four times per day. DX: E11.9   Lancet Devices (PRODIGY LANCING DEVICE) MISC Use bid DX e11.9   lisinopril  (ZESTRIL ) 40 MG tablet TAKE 1 TABLET EVERY DAY   metoprolol  succinate (TOPROL -XL) 50 MG 24 hr tablet Take 1 tablet (50 mg total) by mouth daily. Take with or immediately following a meal.   Multiple Vitamin (MULTIVITAMIN WITH MINERALS) TABS Take 1 tablet by mouth daily.   Omega-3 Fatty Acids (FISH OIL) 1200 MG CAPS Take 1,200 mg by mouth 2 (two) times daily.   pantoprazole  (PROTONIX ) 20 MG tablet Take 1 tablet (20 mg total) by mouth daily.   rosuvastatin  (CRESTOR ) 20 MG tablet Take 1 tablet (20 mg total) by mouth daily.   tirzepatide  (MOUNJARO ) 10 MG/0.5ML Pen Inject 10 mg into the skin once a week.   TRUEplus Lancets 33G MISC Use to check blood sugars up to four times per day. Dx code E11.9   No facility-administered encounter medications on file as of 01/17/2024.   Hearing/Vision screen Hearing Screening - Comments:: No trouble hearing Vision Screening - Comments:: Not up to date Education provided Immunizations and Health Maintenance Health Maintenance  Topic Date Due   OPHTHALMOLOGY EXAM  11/18/2022   FOOT EXAM  09/29/2023   COVID-19 Vaccine (4 - 2025-26 season) 11/12/2023   DTaP/Tdap/Td (1 - Tdap) 08/22/2024 (Originally 06/10/1959)    HEMOGLOBIN A1C  06/23/2024   Diabetic kidney evaluation - eGFR measurement  12/23/2024   Diabetic kidney evaluation - Urine ACR  12/23/2024   Medicare Annual Wellness (AWV)  01/16/2025   Pneumococcal Vaccine: 50+ Years  Completed   Influenza Vaccine  Completed   Zoster Vaccines- Shingrix  Completed   Meningococcal B Vaccine  Aged Out        Assessment/Plan:  This is a routine wellness examination for Vaden.  Patient Care Team: [name], MD as PCP - General (Family Medicine) Mallipeddi, Diannah SQUIBB, MD as PCP - Cardiology (Cardiology) Shaaron Lamar HERO, MD as Attending Physician (Gastroenterology)  I have personally reviewed and noted the following in the patient's chart:   Medical and social history Use of alcohol, tobacco or illicit drugs  Current medications and supplements including opioid prescriptions. Functional ability and status Nutritional status Physical activity Advanced directives List of other physicians Hospitalizations, surgeries, and ER visits in previous 12 months Vitals Screenings to include cognitive, depression, and falls Referrals and appointments  No orders of the defined types were placed in this encounter.  In addition, I have reviewed and discussed with patient certain preventive protocols, quality metrics, and best practice recommendations. A written personalized care plan for preventive services as well as general preventive health recommendations were provided to patient.   Mliss Graff, LPN   [id number]   Return in 1 year (on 01/16/2025).  After Visit Summary: (MyChart) Due to this being a telephonic visit, the after visit summary with patients personalized plan was offered to patient via MyChart   Nurse Notes:

## 2024-01-24 ENCOUNTER — Ambulatory Visit (HOSPITAL_COMMUNITY)
Admission: RE | Admit: 2024-01-24 | Discharge: 2024-01-24 | Disposition: A | Discharge status: Home / Self Care | Source: Ambulatory Visit | Attending: Vascular Surgery | Admitting: Vascular Surgery

## 2024-01-24 ENCOUNTER — Ambulatory Visit: Admitting: Physician Assistant

## 2024-01-24 ENCOUNTER — Ambulatory Visit (HOSPITAL_COMMUNITY)
Admission: RE | Admit: 2024-01-24 | Discharge: 2024-01-24 | Disposition: A | Source: Ambulatory Visit | Attending: Vascular Surgery

## 2024-01-24 VITALS — BP 133/67 | HR 75 | Temp 97.7°F | Wt 227.3 lb

## 2024-01-24 DIAGNOSIS — I7143 Infrarenal abdominal aortic aneurysm, without rupture: Secondary | ICD-10-CM | POA: Insufficient documentation

## 2024-01-24 DIAGNOSIS — Z95828 Presence of other vascular implants and grafts: Secondary | ICD-10-CM | POA: Insufficient documentation

## 2024-01-24 DIAGNOSIS — I739 Peripheral vascular disease, unspecified: Secondary | ICD-10-CM | POA: Diagnosis not present

## 2024-01-24 NOTE — Progress Notes (Signed)
 Office Note     CC:  follow up Requesting Provider:  [name], MD  HPI: Juan [name] is a 83 y.o. (02/09/1941) male who presents for surveillance follow up of EVAR.  Her underwent EVAR of 5.7 cm AAA on 06/13/22 by Dr. Eliza. He did very well post operatively. On follow up CT scan evaluation he had evidence of compression of the left iliac limb so on 08/14/22 kissing balloons were placed. When he was last seen in August of 2024 by Dr. Eliza. He was doing very well and no evidence of any recurrent stenosis or aneurysmal dilatation or leak on duplex.   Today he denies any pain in his legs on ambulation or rest. No tissue loss. He denies any back or abdominal pain. 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    50-69% (12/2015)   Neuromuscular disorder (HCC)    L2-3,L5-S1 bulging disc /nerve impingement   PAD (peripheral artery disease)    Sleep apnea     Past Surgical History:  Procedure Laterality Date   ABDOMINAL AORTIC ENDOVASCULAR STENT GRAFT Bilateral 06/13/2022   Procedure: ABDOMINAL AORTIC ENDOVASCULAR STENT GRAFT;  Surgeon: Eliza Lonni RAMAN, MD;  Location: [facility] OR;  Service: Vascular;  Laterality: Bilateral;   ABDOMINAL AORTOGRAM W/LOWER EXTREMITY N/A 08/14/2022   Procedure: ABDOMINAL AORTOGRAM W/LOWER EXTREMITY;  Surgeon: Eliza Lonni RAMAN, MD;  Location: [facility] INVASIVE CV LAB;  Service: Cardiovascular;  Laterality: N/A;   CATARACT EXTRACTION W/ INTRAOCULAR LENS IMPLANT Bilateral    about 3 years ago   COLONOSCOPY N/A 09/27/2012    Procedure: COLONOSCOPY;  Surgeon: Lamar CHRISTELLA Hollingshead, MD;  Location: [facility];  Service: Endoscopy;  Laterality: N/A;  9:30   CORONARY ANGIOPLASTY     1989   PERIPHERAL VASCULAR INTERVENTION Bilateral 08/14/2022   Procedure: PERIPHERAL VASCULAR INTERVENTION;  Surgeon: Eliza Lonni RAMAN, MD;  Location: [facility] INVASIVE CV LAB;  Service: Cardiovascular;  Laterality: Bilateral;  Iliac   TONSILLECTOMY     ULTRASOUND GUIDANCE FOR VASCULAR ACCESS Bilateral 06/13/2022   Procedure: ULTRASOUND GUIDANCE FOR VASCULAR ACCESS;  Surgeon: Eliza Lonni RAMAN, MD;  Location: [facility] OR;  Service: Vascular;  Laterality: Bilateral;    Social History   Socioeconomic History   Marital status: Married    Spouse name: Not on file   Number of children: Not on file   Years of education: Not on file   Highest education level: Not on file  Occupational History   Occupation: retired    Associate Professor: RETIRED    Comment: [employer]   Tobacco Use   Smoking status: Former    Current packs/day: 0.00    Types: Cigarettes    Start date: 01/01/1953    Quit date: 01/02/1988    Years since quitting: 36.0   Smokeless tobacco: Never  Vaping Use   Vaping status: Never Used  Substance and Sexual Activity   Alcohol use: Not Currently    Comment: rare beer, no history of ETOH abuse   Drug use: No  Sexual activity: Not Currently  Other Topics Concern   Not on file  Social History Narrative   Not on file   Social Drivers of Health   Financial Resource Strain: Low Risk  (01/11/2023)   Overall Financial Resource Strain (CARDIA)    Difficulty of Paying Living Expenses: Not hard at all  Food Insecurity: No Food Insecurity (01/17/2024)   Hunger Vital Sign    Worried About Running Out of Food in the Last Year: Never true    Ran Out of Food in the Last Year: Never true  Transportation Needs: No Transportation Needs (01/17/2024)   PRAPARE - Administrator, Civil Service (Medical): No    Lack of Transportation  (Non-Medical): No  Physical Activity: Inactive (01/17/2024)   Exercise Vital Sign    Days of Exercise per Week: 0 days    Minutes of Exercise per Session: 0 min  Stress: No Stress Concern Present (01/17/2024)   [employer] of Occupational Health - Occupational Stress Questionnaire    Feeling of Stress: Not at all  Social Connections: Socially Integrated (01/17/2024)   Social Connection and Isolation Panel    Frequency of Communication with Friends and Family: More than three times a week    Frequency of Social Gatherings with Friends and Family: Three times a week    Attends Religious Services: More than 4 times per year    Active Member of Clubs or Organizations: Yes    Attends Banker Meetings: More than 4 times per year    Marital Status: Married  Catering Manager Violence: Not At Risk (01/17/2024)   Humiliation, Afraid, Rape, and Kick questionnaire    Fear of Current or Ex-Partner: No    Emotionally Abused: No    Physically Abused: No    Sexually Abused: No    Family History  Problem Relation Age of Onset   Diabetes Mother    Heart disease Mother        After age 36   Heart attack Mother    Hypertension Mother    Diabetes Father    Heart disease Father        After age 51   Hypertension Father    Heart attack Father    Brain cancer Sister    Cancer Sister        Brain   Diabetes Sister    Hypertension Sister    Cancer Brother        Chest Tumor   Diabetes Brother    Hypertension Brother    Deep vein thrombosis Sister    Diabetes Sister    Diabetes Brother        Bilateral leg   Colon cancer Neg Hx     Current Outpatient Medications  Medication Sig Dispense Refill   acetaminophen  (TYLENOL ) 500 MG tablet Take 1,000 mg by mouth every 6 (six) hours as needed for moderate pain.     allopurinol  (ZYLOPRIM ) 300 MG tablet Take 1 tablet (300 mg total) by mouth daily. 90 tablet 10   aspirin  EC 81 MG tablet Take 81 mg by mouth daily.     Blood Glucose  Monitoring Suppl (TRUE METRIX METER) w/Device KIT Use as Directed 1 kit 1   Blood Glucose Monitoring Suppl DEVI 1 each by Does not apply route in the morning, at noon, and at bedtime. May substitute to any manufacturer covered by patient's insurance. Dx code E11.9. 1 each 0   clopidogrel  (PLAVIX ) 75 MG tablet Take 1 tablet (  75 mg total) by mouth daily. 30 tablet 2   diltiazem  (CARDIZEM  CD) 240 MG 24 hr capsule Take 1 capsule (240 mg total) by mouth daily. 90 capsule 3   doxazosin  (CARDURA ) 8 MG tablet Take 1 tablet (8 mg total) by mouth daily. 90 tablet 3   fluticasone (FLONASE) 50 MCG/ACT nasal spray Place 2 sprays into both nostrils 2 (two) times daily as needed for allergies or rhinitis.     furosemide  (LASIX ) 20 MG tablet Take 1 tablet (20 mg total) by mouth daily as needed for edema (SOB). 30 tablet 0   glipiZIDE  (GLUCOTROL ) 5 MG tablet TAKE 1 TABLET TWICE DAILY BEFORE MEALS. 90 tablet 3   glucose blood (TRUE METRIX BLOOD GLUCOSE TEST) test strip 1 each by Other route daily. Use to check blood sugars up to four times per day. DX: E11.9 100 strip 5   Lancet Devices (PRODIGY LANCING DEVICE) MISC Use bid DX e11.9 1 each 2   lisinopril  (ZESTRIL ) 40 MG tablet TAKE 1 TABLET EVERY DAY 90 tablet 3   metoprolol  succinate (TOPROL -XL) 50 MG 24 hr tablet Take 1 tablet (50 mg total) by mouth daily. Take with or immediately following a meal. 90 tablet 3   Multiple Vitamin (MULTIVITAMIN WITH MINERALS) TABS Take 1 tablet by mouth daily.     Omega-3 Fatty Acids (FISH OIL) 1200 MG CAPS Take 1,200 mg by mouth 2 (two) times daily.     pantoprazole  (PROTONIX ) 20 MG tablet Take 1 tablet (20 mg total) by mouth daily. 90 tablet 10   rosuvastatin  (CRESTOR ) 20 MG tablet Take 1 tablet (20 mg total) by mouth daily. 90 tablet 3   tirzepatide  (MOUNJARO ) 10 MG/0.5ML Pen Inject 10 mg into the skin once a week. 4 mL 0   TRUEplus Lancets 33G MISC Use to check blood sugars up to four times per day. Dx code E11.9 100 each 3    No current facility-administered medications for this visit.    Allergies  Allergen Reactions   Jardiance  [Empagliflozin ] Nausea And Vomiting    Severe nausea-      REVIEW OF SYSTEMS:  Negative unless noted in HPI [X]  denotes positive finding, [ ]  denotes negative finding Cardiac  Comments:  Chest pain or chest pressure:    Shortness of breath upon exertion:    Short of breath when lying flat:    Irregular heart rhythm:        Vascular    Pain in calf, thigh, or hip brought on by ambulation:    Pain in feet at night that wakes you up from your sleep:     Blood clot in your veins:    Leg swelling:         Pulmonary    Oxygen at home:    Productive cough:     Wheezing:         Neurologic    Sudden weakness in arms or legs:     Sudden numbness in arms or legs:     Sudden onset of difficulty speaking or slurred speech:    Temporary loss of vision in one eye:     Problems with dizziness:         Gastrointestinal    Blood in stool:     Vomited blood:         Genitourinary    Burning when urinating:     Blood in urine:        Psychiatric    Major depression:  Hematologic    Bleeding problems:    Problems with blood clotting too easily:        Skin    Rashes or ulcers:        Constitutional    Fever or chills:      PHYSICAL EXAMINATION:  Vitals:   01/24/24 1116  BP: 133/67  Pulse: 75  Temp: 97.7 F (36.5 C)  TempSrc: Temporal  Weight: 227 lb 4.8 oz (103.1 kg)    General:  WDWN in NAD; vital signs documented above Gait: Normal HENT: WNL, normocephalic Pulmonary: normal non-labored breathing Cardiac: regular HR Abdomen: soft Vascular Exam/Pulses: 2+ femoral pulses, Right PT and left DP pulses palpable  Extremities: without ischemic changes, without Gangrene , without cellulitis; without open wounds;  Musculoskeletal: no muscle wasting or atrophy  Neurologic: A&O X 3 Psychiatric:  The pt has Normal affect.   Non-Invasive Vascular  Imaging:   VAS US  EVAR Duplex: Endovascular Aortic Repair (EVAR):  +----------+----------------+-------------------+-------------------+           Diameter AP (cm)Diameter Trans (cm)Velocities (cm/sec)  +----------+----------------+-------------------+-------------------+  Aorta    4.73            4.47               103                  +----------+----------------+-------------------+-------------------+  Right Limb1.50            1.60               167                  +----------+----------------+-------------------+-------------------+  Left Limb 1.10            1.10               107                  +----------+----------------+-------------------+-------------------+   +-------------+----------------+  Endoleak TypeNone visualized.  +-------------+----------------+    Summary:  Abdominal Aorta: Patent endovascular aneurysm repair with no evidence of endoleak. Residual sac diameter measures 4.73 x 4.47 cm. This has decreased since previous study.    VAS US  Aorta/ IVC/ Iliacs: Summary:  Stenosis: +------------------+--------------+  Location          Stent           +------------------+--------------+  Right Common Iliacno stenosis     +------------------+--------------+  Left Common Iliac 1-49% stenosis  +------------------+--------------+   Stent struts not well visualized bilaterally.   ASSESSMENT/PLAN:: 83 y.o. male here for surveillance follow up of EVAR.  Her underwent EVAR of 5.7 cm AAA on 06/13/22 by Dr. Eliza. He did very well post operatively. On follow up CT scan evaluation he had evidence of compression of the left iliac limb so on 08/14/22 kissing balloons were placed. He is without any claudication, rest pain or tissue loss. He denies any back or abdominal pain. His duplex today shows no stenosis of his iliac stents. His aneurysm continues to decrease in size with maximal diameter of 4.73 cm. No evidence of endoleak. He has palpable  pulses on examination.  - he will continue Aspirin , Statin, and Plavix  -he will follow up in 1 year with repeat EVAR duplex and ABI   Teretha Damme, PA-C [facility] [phone]  Clinic MD:   [place]

## 2024-02-13 ENCOUNTER — Encounter: Payer: Self-pay | Admitting: Family Medicine

## 2024-02-13 ENCOUNTER — Other Ambulatory Visit: Payer: Self-pay

## 2024-02-13 DIAGNOSIS — I10 Essential (primary) hypertension: Secondary | ICD-10-CM

## 2024-02-13 DIAGNOSIS — N1832 Chronic kidney disease, stage 3b: Secondary | ICD-10-CM

## 2024-02-13 DIAGNOSIS — E119 Type 2 diabetes mellitus without complications: Secondary | ICD-10-CM

## 2024-02-13 DIAGNOSIS — I2510 Atherosclerotic heart disease of native coronary artery without angina pectoris: Secondary | ICD-10-CM

## 2024-02-14 ENCOUNTER — Other Ambulatory Visit: Payer: Self-pay

## 2024-02-14 DIAGNOSIS — E119 Type 2 diabetes mellitus without complications: Secondary | ICD-10-CM

## 2024-02-14 DIAGNOSIS — N1832 Chronic kidney disease, stage 3b: Secondary | ICD-10-CM

## 2024-02-14 MED ORDER — TIRZEPATIDE 12.5 MG/0.5ML ~~LOC~~ SOAJ: 12.5000 mg | SUBCUTANEOUS | 0 refills | Status: DC

## 2024-02-26 ENCOUNTER — Other Ambulatory Visit

## 2024-02-26 DIAGNOSIS — N1832 Chronic kidney disease, stage 3b: Secondary | ICD-10-CM

## 2024-02-26 LAB — RENAL FUNCTION PANEL
Albumin: 3.9 g/dL (ref 3.6–5.1)
BUN/Creatinine Ratio: 14 (calc) (ref 6–22)
BUN: 23 mg/dL (ref 7–25)
CO2: 25 mmol/L (ref 20–32)
Calcium: 8.7 mg/dL (ref 8.6–10.3)
Chloride: 103 mmol/L (ref 98–110)
Creat: 1.64 mg/dL — ABNORMAL HIGH (ref 0.70–1.22)
Glucose, Bld: 137 mg/dL — ABNORMAL HIGH (ref 65–99)
Phosphorus: 3.5 mg/dL (ref 2.1–4.3)
Potassium: 4.6 mmol/L (ref 3.5–5.3)
Sodium: 136 mmol/L (ref 135–146)

## 2024-02-28 ENCOUNTER — Ambulatory Visit: Payer: Self-pay | Admitting: Family Medicine

## 2024-03-04 ENCOUNTER — Encounter: Payer: Self-pay | Admitting: Family Medicine

## 2024-03-04 ENCOUNTER — Ambulatory Visit: Admitting: Family Medicine

## 2024-03-04 VITALS — BP 132/64 | HR 75 | Temp 97.6°F | Ht 70.0 in | Wt 228.0 lb

## 2024-03-04 DIAGNOSIS — N1832 Chronic kidney disease, stage 3b: Secondary | ICD-10-CM

## 2024-03-04 DIAGNOSIS — I2510 Atherosclerotic heart disease of native coronary artery without angina pectoris: Secondary | ICD-10-CM

## 2024-03-04 DIAGNOSIS — E1122 Type 2 diabetes mellitus with diabetic chronic kidney disease: Secondary | ICD-10-CM

## 2024-03-04 DIAGNOSIS — I10 Essential (primary) hypertension: Secondary | ICD-10-CM

## 2024-03-04 DIAGNOSIS — Z7985 Long-term (current) use of injectable non-insulin antidiabetic drugs: Secondary | ICD-10-CM

## 2024-03-04 DIAGNOSIS — Z0001 Encounter for general adult medical examination with abnormal findings: Secondary | ICD-10-CM

## 2024-03-04 DIAGNOSIS — Z0000 Encounter for general adult medical examination without abnormal findings: Secondary | ICD-10-CM

## 2024-03-04 MED ORDER — MECLIZINE HCL 25 MG PO TABS: 25.0000 mg | ORAL_TABLET | Freq: Three times a day (TID) | ORAL | 0 refills | Status: AC | PRN

## 2024-03-04 NOTE — Progress Notes (Signed)
 "  Subjective:    Patient ID: Juan [name], male    DOB: Jul 15, 1940, 83 y.o.   MRN: [id number]  Patient is a very pleasant 83 year old Caucasian gentleman here today for physical exam.  Past medical history is significant for coronary artery disease, a AAA status post endovascular stent repair in 2024, type 2 diabetes mellitus, hyperlipidemia, hypertension, and chronic kidney disease stage III.  He denies any concerns.  All of his immunizations are up-to-date including the flu shot, his pneumonia vaccine, his shingles vaccine, his COVID shot.  He is due for RSV and we discussed this.  His recent lab work included a hemoglobin A1c of 6.2 in October.  His cholesterol was checked in October and was outstanding with an LDL cholesterol below 70.  He also had a CBC that showed his chronic stable mild anemia of chronic disease but was otherwise normal in October.  He recently had a BMP which showed his creatinine had improved from 1.9-1.6.  He denies any falls or depression or memory loss. Lab on 02/26/2024  Component Date Value Ref Range Status   Glucose, Bld 02/26/2024 137 (H)  65 - 99 mg/dL Final   Comment: .            Fasting reference interval . For someone without known diabetes, a glucose value >125 mg/dL indicates that they may have diabetes and this should be confirmed with a follow-up test. .    BUN 02/26/2024 23  7 - 25 mg/dL Final   Creat 87/83/7974 1.64 (H)  0.70 - 1.22 mg/dL Final   BUN/Creatinine Ratio 02/26/2024 14  6 - 22 (calc) Final   Sodium 02/26/2024 136  135 - 146 mmol/L Final   Potassium 02/26/2024 4.6  3.5 - 5.3 mmol/L Final   Chloride 02/26/2024 103  98 - 110 mmol/L Final   CO2 02/26/2024 25  20 - 32 mmol/L Final   Calcium  02/26/2024 8.7  8.6 - 10.3 mg/dL Final   Phosphorus 87/83/7974 3.5  2.1 - 4.3 mg/dL Final   Albumin 87/83/7974 3.9  3.6 - 5.1 g/dL Final    Past Medical History:  Diagnosis Date   AAA (abdominal aortic aneurysm) 10/2010   3.5cm   Arthritis     CAD (coronary artery disease)    CKD (chronic kidney disease) stage 3, GFR 30-59 ml/min (HCC)    Colon polyps    Diabetes mellitus without complication (HCC)    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   Hyperlipidemia    Hypertension    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    50-69% (12/2015)   Neuromuscular disorder (HCC)    L2-3,L5-S1 bulging disc /nerve impingement   PAD (peripheral artery disease)    Sleep apnea    Past Surgical History:  Procedure Laterality Date   ABDOMINAL AORTIC ENDOVASCULAR STENT GRAFT Bilateral 06/13/2022   Procedure: ABDOMINAL AORTIC ENDOVASCULAR STENT GRAFT;  Surgeon: Eliza Lonni RAMAN, MD;  Location: [facility] OR;  Service: Vascular;  Laterality: Bilateral;   ABDOMINAL AORTOGRAM W/LOWER EXTREMITY N/A 08/14/2022   Procedure: ABDOMINAL AORTOGRAM W/LOWER EXTREMITY;  Surgeon: Eliza Lonni RAMAN, MD;  Location: [facility] INVASIVE CV LAB;  Service: Cardiovascular;  Laterality: N/A;   CATARACT EXTRACTION W/ INTRAOCULAR LENS IMPLANT Bilateral    about 3 years ago   COLONOSCOPY N/A 09/27/2012   Procedure: COLONOSCOPY;  Surgeon: Lamar CHRISTELLA Hollingshead, MD;  [facility];  Service: Endoscopy;  Laterality: N/A;  9:30   CORONARY ANGIOPLASTY  1989   PERIPHERAL VASCULAR INTERVENTION Bilateral 08/14/2022   Procedure: PERIPHERAL VASCULAR INTERVENTION;  Surgeon: Eliza Lonni RAMAN, MD;  Location: [facility] INVASIVE CV LAB;  Service: Cardiovascular;  Laterality: Bilateral;  Iliac   TONSILLECTOMY     ULTRASOUND GUIDANCE FOR VASCULAR ACCESS Bilateral 06/13/2022   Procedure: ULTRASOUND GUIDANCE FOR VASCULAR ACCESS;  Surgeon: Eliza Lonni RAMAN, MD;  Location: [facility] OR;  Service: Vascular;  Laterality: Bilateral;   Current Outpatient Medications on File Prior to Visit  Medication Sig Dispense Refill   acetaminophen  (TYLENOL ) 500 MG tablet Take 1,000 mg by mouth every 6 (six) hours as needed for moderate pain.     allopurinol  (ZYLOPRIM ) 300 MG tablet  Take 1 tablet (300 mg total) by mouth daily. 90 tablet 10   aspirin  EC 81 MG tablet Take 81 mg by mouth daily.     Blood Glucose Monitoring Suppl (TRUE METRIX METER) w/Device KIT Use as Directed 1 kit 1   Blood Glucose Monitoring Suppl DEVI 1 each by Does not apply route in the morning, at noon, and at bedtime. May substitute to any manufacturer covered by patient's insurance. Dx code E11.9. 1 each 0   clopidogrel  (PLAVIX ) 75 MG tablet Take 1 tablet (75 mg total) by mouth daily. 30 tablet 2   diltiazem  (CARDIZEM  CD) 240 MG 24 hr capsule Take 1 capsule (240 mg total) by mouth daily. 90 capsule 3   doxazosin  (CARDURA ) 8 MG tablet Take 1 tablet (8 mg total) by mouth daily. 90 tablet 3   fluticasone (FLONASE) 50 MCG/ACT nasal spray Place 2 sprays into both nostrils 2 (two) times daily as needed for allergies or rhinitis.     furosemide  (LASIX ) 20 MG tablet Take 1 tablet (20 mg total) by mouth daily as needed for edema (SOB). 30 tablet 0   glipiZIDE  (GLUCOTROL ) 5 MG tablet TAKE 1 TABLET TWICE DAILY BEFORE MEALS. 90 tablet 3   glucose blood (TRUE METRIX BLOOD GLUCOSE TEST) test strip 1 each by Other route daily. Use to check blood sugars up to four times per day. DX: E11.9 100 strip 5   Lancet Devices (PRODIGY LANCING DEVICE) MISC Use bid DX e11.9 1 each 2   lisinopril  (ZESTRIL ) 40 MG tablet TAKE 1 TABLET EVERY DAY 90 tablet 3   metoprolol  succinate (TOPROL -XL) 50 MG 24 hr tablet Take 1 tablet (50 mg total) by mouth daily. Take with or immediately following a meal. 90 tablet 3   Multiple Vitamin (MULTIVITAMIN WITH MINERALS) TABS Take 1 tablet by mouth daily.     Omega-3 Fatty Acids (FISH OIL) 1200 MG CAPS Take 1,200 mg by mouth 2 (two) times daily.     pantoprazole  (PROTONIX ) 20 MG tablet Take 1 tablet (20 mg total) by mouth daily. 90 tablet 10   rosuvastatin  (CRESTOR ) 20 MG tablet Take 1 tablet (20 mg total) by mouth daily. 90 tablet 3   tirzepatide  (MOUNJARO ) 12.5 MG/0.5ML Pen Inject 12.5 mg into the  skin once a week. 2 mL 0   TRUEplus Lancets 33G MISC Use to check blood sugars up to four times per day. Dx code E11.9 100 each 3   No current facility-administered medications on file prior to visit.   Allergies  Allergen Reactions   Jardiance  [Empagliflozin ] Nausea And Vomiting    Severe nausea-    Social History   Socioeconomic History   Marital status: Married    Spouse name: Not on file   Number of children: Not on file   Years of education:  Not on file   Highest education level: Not on file  Occupational History   Occupation: retired    Associate Professor: RETIRED    Comment: [employer]   Tobacco Use   Smoking status: Former    Current packs/day: 0.00    Types: Cigarettes    Start date: 01/01/1953    Quit date: 01/02/1988    Years since quitting: 36.1   Smokeless tobacco: Never  Vaping Use   Vaping status: Never Used  Substance and Sexual Activity   Alcohol use: Not Currently    Comment: rare beer, no history of ETOH abuse   Drug use: No   Sexual activity: Not Currently  Other Topics Concern   Not on file  Social History Narrative   Not on file   Social Drivers of Health   Tobacco Use: Medium Risk (01/24/2024)   Patient History    Smoking Tobacco Use: Former    Smokeless Tobacco Use: Never    Passive Exposure: Not on Actuary Strain: Low Risk (01/11/2023)   Overall Financial Resource Strain (CARDIA)    Difficulty of Paying Living Expenses: Not hard at all  Food Insecurity: No Food Insecurity (01/17/2024)   Epic    Worried About Programme Researcher, Broadcasting/film/video in the Last Year: Never true    Ran Out of Food in the Last Year: Never true  Transportation Needs: No Transportation Needs (01/17/2024)   Epic    Lack of Transportation (Medical): No    Lack of Transportation (Non-Medical): No  Physical Activity: Inactive (01/17/2024)   Exercise Vital Sign    Days of Exercise per Week: 0 days    Minutes of Exercise per Session: 0 min  Stress: No Stress Concern  Present (01/17/2024)   [employer] of Occupational Health - Occupational Stress Questionnaire    Feeling of Stress: Not at all  Social Connections: Socially Integrated (01/17/2024)   Social Connection and Isolation Panel    Frequency of Communication with Friends and Family: More than three times a week    Frequency of Social Gatherings with Friends and Family: Three times a week    Attends Religious Services: More than 4 times per year    Active Member of Clubs or Organizations: Yes    Attends Banker Meetings: More than 4 times per year    Marital Status: Married  Catering Manager Violence: Not At Risk (01/17/2024)   Epic    Fear of Current or Ex-Partner: No    Emotionally Abused: No    Physically Abused: No    Sexually Abused: No  Depression (PHQ2-9): Low Risk (01/17/2024)   Depression (PHQ2-9)    PHQ-2 Score: 0  Alcohol Screen: Low Risk (01/11/2023)   Alcohol Screen    Last Alcohol Screening Score (AUDIT): 0  Housing: Unknown (01/17/2024)   Epic    Unable to Pay for Housing in the Last Year: No    Number of Times Moved in the Last Year: Not on file    Homeless in the Last Year: No  Utilities: Not At Risk (01/17/2024)   Epic    Threatened with loss of utilities: No  Health Literacy: Adequate Health Literacy (01/17/2024)   B1300 Health Literacy    Frequency of need for help with medical instructions: Never   Family History  Problem Relation Age of Onset   Diabetes Mother    Heart disease Mother        After age 29   Heart attack Mother  Hypertension Mother    Diabetes Father    Heart disease Father        After age 80   Hypertension Father    Heart attack Father    Brain cancer Sister    Cancer Sister        Brain   Diabetes Sister    Hypertension Sister    Cancer Brother        Chest Tumor   Diabetes Brother    Hypertension Brother    Deep vein thrombosis Sister    Diabetes Sister    Diabetes Brother        Bilateral leg   Colon cancer Neg  Hx       Review of Systems  All other systems reviewed and are negative.      Objective:   Physical Exam Vitals reviewed.  Constitutional:      General: He is not in acute distress.    Appearance: Normal appearance. He is well-developed. He is obese. He is not ill-appearing, toxic-appearing or diaphoretic.  HENT:     Head: Normocephalic and atraumatic.     Right Ear: External ear normal.     Left Ear: External ear normal.     Nose: Nose normal.     Mouth/Throat:     Pharynx: No oropharyngeal exudate.  Eyes:     General: No scleral icterus.       Right eye: No discharge.        Left eye: No discharge.     Conjunctiva/sclera: Conjunctivae normal.     Pupils: Pupils are equal, round, and reactive to light.  Neck:     Thyroid : No thyromegaly.     Vascular: No carotid bruit or JVD.     Trachea: No tracheal deviation.  Cardiovascular:     Rate and Rhythm: Normal rate. Rhythm irregular.     Heart sounds: Murmur heard.     No friction rub. No gallop.  Pulmonary:     Effort: Pulmonary effort is normal. No respiratory distress.     Breath sounds: Normal breath sounds. No stridor. No wheezing or rales.  Chest:     Chest wall: No tenderness.  Abdominal:     General: Bowel sounds are normal. There is no distension.     Palpations: Abdomen is soft. There is no mass.     Tenderness: There is no abdominal tenderness. There is no guarding or rebound.  Musculoskeletal:        General: No tenderness. Normal range of motion.     Cervical back: Normal range of motion and neck supple.     Right lower leg: Edema present.     Left lower leg: Edema present.  Lymphadenopathy:     Cervical: No cervical adenopathy.  Skin:    General: Skin is warm.     Coloration: Skin is not jaundiced or pale.     Findings: No bruising, erythema, lesion or rash.  Neurological:     Mental Status: He is alert and oriented to person, place, and time.     Cranial Nerves: No cranial nerve deficit.      Motor: No abnormal muscle tone.     Coordination: Coordination normal.     Deep Tendon Reflexes: Reflexes are normal and symmetric.  Psychiatric:        Behavior: Behavior normal.        Thought Content: Thought content normal.        Judgment: Judgment normal.  Assessment & Plan:  Stage 3b chronic kidney disease (HCC) - Plan: Protein / Creatinine Ratio, Urine, CANCELED: CBC with Differential/Platelet, CANCELED: Lipid panel  Essential hypertension  Coronary artery disease involving native coronary artery of native heart without angina pectoris  Type 2 diabetes mellitus with stage 3b chronic kidney disease, unspecified whether long term insulin  use (HCC)  General medical exam Physical exam today is normal.  Blood pressure is excellent.  I will check a urine protein creatinine ratio to complete his workup for his chronic kidney disease.  If significantly elevated, we can consider switching his medication to kerendia.  He could not tolerate Jardiance .  His blood pressure is outstanding.  His LDL cholesterol is well below his goal when checked in October.  His hemoglobin A1c was excellent in October.  His creatinine is stable.  His immunizations are up-to-date except for RSV.  I recommended that he receive this. "

## 2024-03-05 LAB — PROTEIN / CREATININE RATIO, URINE
Creatinine, Urine: 187 mg/dL (ref 20–320)
Protein/Creat Ratio: 267 mg/g{creat} — ABNORMAL HIGH (ref 25–148)
Protein/Creatinine Ratio: 0.267 mg/mg{creat} — ABNORMAL HIGH (ref 0.025–0.148)
Total Protein, Urine: 50 mg/dL — ABNORMAL HIGH (ref 5–25)

## 2024-03-06 ENCOUNTER — Ambulatory Visit: Payer: Self-pay | Admitting: Family Medicine

## 2024-03-12 ENCOUNTER — Encounter: Payer: Self-pay | Admitting: Family Medicine

## 2024-03-14 ENCOUNTER — Other Ambulatory Visit: Payer: Self-pay

## 2024-03-14 DIAGNOSIS — E119 Type 2 diabetes mellitus without complications: Secondary | ICD-10-CM

## 2024-03-14 DIAGNOSIS — N1832 Chronic kidney disease, stage 3b: Secondary | ICD-10-CM

## 2024-03-14 MED ORDER — TIRZEPATIDE 12.5 MG/0.5ML ~~LOC~~ SOAJ: 12.5000 mg | SUBCUTANEOUS | 0 refills | Status: DC

## 2024-03-18 ENCOUNTER — Ambulatory Visit: Admitting: Family Medicine

## 2024-03-18 ENCOUNTER — Encounter: Payer: Self-pay | Admitting: Family Medicine

## 2024-03-18 VITALS — BP 132/70 | HR 98 | Ht 70.0 in | Wt 228.0 lb

## 2024-03-18 DIAGNOSIS — G8929 Other chronic pain: Secondary | ICD-10-CM | POA: Diagnosis not present

## 2024-03-18 DIAGNOSIS — D485 Neoplasm of uncertain behavior of skin: Secondary | ICD-10-CM

## 2024-03-18 DIAGNOSIS — M545 Low back pain, unspecified: Secondary | ICD-10-CM | POA: Diagnosis not present

## 2024-03-18 DIAGNOSIS — M542 Cervicalgia: Secondary | ICD-10-CM | POA: Diagnosis not present

## 2024-03-18 DIAGNOSIS — L989 Disorder of the skin and subcutaneous tissue, unspecified: Secondary | ICD-10-CM

## 2024-03-18 NOTE — Progress Notes (Signed)
 "   Subjective:    Patient ID: Juan [name], male    DOB: 05-May-1940, 84 y.o.   MRN: [id number]  Patient has 2 atypical hyperpigmented lesions on his chest.  1 is on his right breast just medial to the nipple.  It is roughly 4 mm in diameter.  It has irregular borders with a dark black center.  It appears to be a seborrheic keratosis.  However the darker pigment appears to be spreading into the surrounding skin with an irregular border.  The second lesion is on his left breast just medial to the nipple.  This is a 5 mm hyperkeratotic brown papule again with a dark black center with an irregular border. 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    50-69% (12/2015)   Neuromuscular disorder (HCC)    L2-3,L5-S1 bulging disc /nerve impingement   PAD (peripheral artery disease)    Sleep apnea    Past Surgical History:  Procedure Laterality Date   ABDOMINAL AORTIC ENDOVASCULAR STENT GRAFT Bilateral 06/13/2022   Procedure: ABDOMINAL AORTIC ENDOVASCULAR STENT GRAFT;  Surgeon: Eliza Lonni RAMAN, MD;  Location: [facility] OR;  Service: Vascular;  Laterality: Bilateral;   ABDOMINAL AORTOGRAM W/LOWER EXTREMITY N/A 08/14/2022   Procedure: ABDOMINAL AORTOGRAM W/LOWER EXTREMITY;  Surgeon: Eliza Lonni RAMAN, MD;  Location: [facility] INVASIVE CV LAB;  Service: Cardiovascular;  Laterality: N/A;   CATARACT EXTRACTION W/ INTRAOCULAR LENS IMPLANT Bilateral    about 3 years ago   COLONOSCOPY N/A 09/27/2012   Procedure: COLONOSCOPY;  Surgeon: Lamar CHRISTELLA Hollingshead, MD;  Location: [facility];  Service: Endoscopy;  Laterality: N/A;  9:30   CORONARY  ANGIOPLASTY     1989   PERIPHERAL VASCULAR INTERVENTION Bilateral 08/14/2022   Procedure: PERIPHERAL VASCULAR INTERVENTION;  Surgeon: Eliza Lonni RAMAN, MD;  Location: [facility] INVASIVE CV LAB;  Service: Cardiovascular;  Laterality: Bilateral;  Iliac   TONSILLECTOMY     ULTRASOUND GUIDANCE FOR VASCULAR ACCESS Bilateral 06/13/2022   Procedure: ULTRASOUND GUIDANCE FOR VASCULAR ACCESS;  Surgeon: Eliza Lonni RAMAN, MD;  Location: [facility] OR;  Service: Vascular;  Laterality: Bilateral;   Current Outpatient Medications on File Prior to Visit  Medication Sig Dispense Refill   acetaminophen  (TYLENOL ) 500 MG tablet Take 1,000 mg by mouth every 6 (six) hours as needed for moderate pain.     allopurinol  (ZYLOPRIM ) 300 MG tablet Take 1 tablet (300 mg total) by mouth daily. 90 tablet 10   aspirin  EC 81 MG tablet Take 81 mg by mouth daily.     Blood Glucose Monitoring Suppl (TRUE METRIX METER) w/Device KIT Use as Directed 1 kit 1   Blood Glucose Monitoring Suppl DEVI 1 each by Does not apply route in the morning, at noon, and at bedtime. May substitute to any manufacturer covered by patient's insurance. Dx code E11.9. 1 each 0   clopidogrel  (PLAVIX ) 75 MG tablet Take 1 tablet (75 mg total) by mouth daily. 30 tablet 2   diltiazem  (CARDIZEM  CD) 240 MG 24 hr capsule Take 1 capsule (240 mg total) by mouth daily. 90 capsule 3  doxazosin  (CARDURA ) 8 MG tablet Take 1 tablet (8 mg total) by mouth daily. 90 tablet 3   fluticasone (FLONASE) 50 MCG/ACT nasal spray Place 2 sprays into both nostrils 2 (two) times daily as needed for allergies or rhinitis.     furosemide  (LASIX ) 20 MG tablet Take 1 tablet (20 mg total) by mouth daily as needed for edema (SOB). 30 tablet 0   glipiZIDE  (GLUCOTROL ) 5 MG tablet TAKE 1 TABLET TWICE DAILY BEFORE MEALS. 90 tablet 3   glucose blood (TRUE METRIX BLOOD GLUCOSE TEST) test strip 1 each by Other route daily. Use to check blood sugars up to four times per day. DX: E11.9 100 strip 5    Lancet Devices (PRODIGY LANCING DEVICE) MISC Use bid DX e11.9 1 each 2   lisinopril  (ZESTRIL ) 40 MG tablet TAKE 1 TABLET EVERY DAY 90 tablet 3   meclizine  (ANTIVERT ) 25 MG tablet Take 1 tablet (25 mg total) by mouth 3 (three) times daily as needed. 30 tablet 0   metoprolol  succinate (TOPROL -XL) 50 MG 24 hr tablet Take 1 tablet (50 mg total) by mouth daily. Take with or immediately following a meal. 90 tablet 3   Multiple Vitamin (MULTIVITAMIN WITH MINERALS) TABS Take 1 tablet by mouth daily.     Omega-3 Fatty Acids (FISH OIL) 1200 MG CAPS Take 1,200 mg by mouth 2 (two) times daily.     pantoprazole  (PROTONIX ) 20 MG tablet Take 1 tablet (20 mg total) by mouth daily. 90 tablet 10   rosuvastatin  (CRESTOR ) 20 MG tablet Take 1 tablet (20 mg total) by mouth daily. 90 tablet 3   tirzepatide  (MOUNJARO ) 12.5 MG/0.5ML Pen Inject 12.5 mg into the skin once a week. 2 mL 0   TRUEplus Lancets 33G MISC Use to check blood sugars up to four times per day. Dx code E11.9 100 each 3   No current facility-administered medications on file prior to visit.   Allergies  Allergen Reactions   Jardiance  [Empagliflozin ] Nausea And Vomiting    Severe nausea-    Social History   Socioeconomic History   Marital status: Married    Spouse name: Not on file   Number of children: Not on file   Years of education: Not on file   Highest education level: Not on file  Occupational History   Occupation: retired    Associate Professor: RETIRED    Comment: Automotive   Tobacco Use   Smoking status: Former    Current packs/day: 0.00    Types: Cigarettes    Start date: 01/01/1953    Quit date: 01/02/1988    Years since quitting: 36.2   Smokeless tobacco: Never  Vaping Use   Vaping status: Never Used  Substance and Sexual Activity   Alcohol use: Not Currently    Comment: rare beer, no history of ETOH abuse   Drug use: No   Sexual activity: Not Currently  Other Topics Concern   Not on file  Social History Narrative   Not on  file   Social Drivers of Health   Tobacco Use: Medium Risk (03/18/2024)   Patient History    Smoking Tobacco Use: Former    Smokeless Tobacco Use: Never    Passive Exposure: Not on Actuary Strain: Low Risk (01/11/2023)   Overall Financial Resource Strain (CARDIA)    Difficulty of Paying Living Expenses: Not hard at all  Food Insecurity: No Food Insecurity (01/17/2024)   Epic    Worried About Programme Researcher, Broadcasting/film/video in  the Last Year: Never true    Ran Out of Food in the Last Year: Never true  Transportation Needs: No Transportation Needs (01/17/2024)   Epic    Lack of Transportation (Medical): No    Lack of Transportation (Non-Medical): No  Physical Activity: Inactive (01/17/2024)   Exercise Vital Sign    Days of Exercise per Week: 0 days    Minutes of Exercise per Session: 0 min  Stress: No Stress Concern Present (01/17/2024)   [employer] of Occupational Health - Occupational Stress Questionnaire    Feeling of Stress: Not at all  Social Connections: Socially Integrated (01/17/2024)   Social Connection and Isolation Panel    Frequency of Communication with Friends and Family: More than three times a week    Frequency of Social Gatherings with Friends and Family: Three times a week    Attends Religious Services: More than 4 times per year    Active Member of Clubs or Organizations: Yes    Attends Banker Meetings: More than 4 times per year    Marital Status: Married  Catering Manager Violence: Not At Risk (01/17/2024)   Epic    Fear of Current or Ex-Partner: No    Emotionally Abused: No    Physically Abused: No    Sexually Abused: No  Depression (PHQ2-9): Low Risk (03/04/2024)   Depression (PHQ2-9)    PHQ-2 Score: 0  Alcohol Screen: Low Risk (01/11/2023)   Alcohol Screen    Last Alcohol Screening Score (AUDIT): 0  Housing: Unknown (01/17/2024)   Epic    Unable to Pay for Housing in the Last Year: No    Number of Times Moved in the Last Year:  Not on file    Homeless in the Last Year: No  Utilities: Not At Risk (01/17/2024)   Epic    Threatened with loss of utilities: No  Health Literacy: Adequate Health Literacy (01/17/2024)   B1300 Health Literacy    Frequency of need for help with medical instructions: Never   Family History  Problem Relation Age of Onset   Diabetes Mother    Heart disease Mother        After age 70   Heart attack Mother    Hypertension Mother    Diabetes Father    Heart disease Father        After age 67   Hypertension Father    Heart attack Father    Brain cancer Sister    Cancer Sister        Brain   Diabetes Sister    Hypertension Sister    Cancer Brother        Chest Tumor   Diabetes Brother    Hypertension Brother    Deep vein thrombosis Sister    Diabetes Sister    Diabetes Brother        Bilateral leg   Colon cancer Neg Hx       Review of Systems  All other systems reviewed and are negative.      Objective:   Physical Exam Vitals reviewed.  Constitutional:      General: He is not in acute distress.    Appearance: He is well-developed. He is not diaphoretic.  HENT:     Head: Normocephalic and atraumatic.  Neck:     Thyroid : No thyromegaly.     Vascular: No JVD.     Trachea: No tracheal deviation.  Cardiovascular:     Rate and Rhythm: Normal rate and  regular rhythm.     Heart sounds: Murmur heard.     No friction rub. No gallop.  Pulmonary:     Effort: Pulmonary effort is normal. No respiratory distress.     Breath sounds: Normal breath sounds. No stridor. No wheezing or rales.  Chest:     Chest wall: No tenderness.    Abdominal:     General: Bowel sounds are normal.     Palpations: Abdomen is soft.  Neurological:     Mental Status: He is alert and oriented to person, place, and time.     Cranial Nerves: No cranial nerve deficit.     Motor: No abnormal muscle tone.     Coordination: Coordination normal.     Deep Tendon Reflexes: Reflexes are normal and  symmetric.  Psychiatric:        Behavior: Behavior normal.        Thought Content: Thought content normal.        Judgment: Judgment normal.         Assessment & Plan:  Skin lesion of chest wall - Plan: Pathology Report ([facility]), Pathology Report ([facility]) I believe both skin lesions are seborrheic keratoses but given they are irregular colors and they are irregular borders and the fact the pigmentation appears to be spreading into the surrounding tissue, I have recommended a shave biopsy to rule out malignancy.  I anesthetized each of the lesions with 0.1% lidocaine  with epinephrine.  Using sterile technique I performed a shave biopsy of the lesion on his right breast and sent that to pathology in a labeled container denoted right breast.  I achieved hemostasis with Drysol and a Band-Aid.  I then repeated the procedure on the lesion of the left breast and sent that to pathology in the labeled container denoted left breast.  Hemostasis was achieved with Drysol and a Band-Aid.  The patient reports neck pain and low back pain with easy fatigability.  He is relatively deconditioned.  I have recommended a trial of physical therapy to help with his neck and back pain.  X-rays previously were unremarkable "

## 2024-03-20 LAB — PATHOLOGY REPORT

## 2024-03-20 LAB — TISSUE PATH REPORT

## 2024-03-21 ENCOUNTER — Ambulatory Visit: Payer: Self-pay | Admitting: Family Medicine

## 2024-03-30 ENCOUNTER — Encounter: Payer: Self-pay | Admitting: Family Medicine

## 2024-03-30 ENCOUNTER — Other Ambulatory Visit: Payer: Self-pay | Admitting: Family Medicine

## 2024-03-30 DIAGNOSIS — N1832 Chronic kidney disease, stage 3b: Secondary | ICD-10-CM

## 2024-03-30 DIAGNOSIS — E119 Type 2 diabetes mellitus without complications: Secondary | ICD-10-CM

## 2024-03-31 ENCOUNTER — Other Ambulatory Visit: Payer: Self-pay

## 2024-03-31 DIAGNOSIS — I10 Essential (primary) hypertension: Secondary | ICD-10-CM

## 2024-03-31 DIAGNOSIS — E119 Type 2 diabetes mellitus without complications: Secondary | ICD-10-CM

## 2024-03-31 DIAGNOSIS — N1832 Chronic kidney disease, stage 3b: Secondary | ICD-10-CM

## 2024-03-31 DIAGNOSIS — I2510 Atherosclerotic heart disease of native coronary artery without angina pectoris: Secondary | ICD-10-CM

## 2024-03-31 MED ORDER — DILTIAZEM HCL ER COATED BEADS 240 MG PO CP24: 240.0000 mg | ORAL_CAPSULE | Freq: Every day | ORAL | 3 refills | Status: AC

## 2024-03-31 MED ORDER — GLIPIZIDE 5 MG PO TABS: ORAL_TABLET | ORAL | 3 refills | Status: AC

## 2024-04-07 NOTE — Therapy (Incomplete)
 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    50-69% (12/2015)   Neuromuscular disorder (HCC)    L2-3,L5-S1 bulging disc /nerve impingement   PAD (peripheral artery disease)    Sleep apnea    Past Surgical History:  Procedure Laterality Date   ABDOMINAL AORTIC ENDOVASCULAR STENT GRAFT Bilateral 06/13/2022   Procedure: ABDOMINAL AORTIC ENDOVASCULAR STENT GRAFT;  Surgeon: Eliza Lonni RAMAN, MD;  Location: [facility] OR;  Service: Vascular;  Laterality: Bilateral;   ABDOMINAL AORTOGRAM W/LOWER EXTREMITY N/A 08/14/2022   Procedure: ABDOMINAL AORTOGRAM W/LOWER EXTREMITY;  Surgeon: Eliza Lonni RAMAN, MD;  Location: [facility] INVASIVE CV LAB;  Service: Cardiovascular;  Laterality: N/A;   CATARACT EXTRACTION W/ INTRAOCULAR LENS IMPLANT Bilateral    about 3 years ago   COLONOSCOPY N/A 09/27/2012   Procedure: COLONOSCOPY;  Surgeon: Lamar CHRISTELLA Hollingshead, MD;  Location: [facility];  Service: Endoscopy;  Laterality: N/A;  9:30   CORONARY ANGIOPLASTY     1989   PERIPHERAL VASCULAR INTERVENTION Bilateral 08/14/2022   Procedure: PERIPHERAL VASCULAR INTERVENTION;  Surgeon: Eliza Lonni RAMAN, MD;  Location: [facility] INVASIVE CV LAB;  Service: Cardiovascular;  Laterality: Bilateral;  Iliac   TONSILLECTOMY     ULTRASOUND GUIDANCE FOR VASCULAR ACCESS Bilateral 06/13/2022   Procedure: ULTRASOUND GUIDANCE FOR VASCULAR ACCESS;  Surgeon: Eliza Lonni RAMAN, MD;  Location: [facility] OR;   Service: Vascular;  Laterality: Bilateral;   Patient Active Problem List   Diagnosis Date Noted   Status post endovascular aneurysm repair (EVAR) 12/01/2022   Long term (current) use of antithrombotics/antiplatelets 12/01/2022   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   Neuropathy associated with endocrine disorder 05/25/2020   Restless legs syndrome (RLS) 05/25/2020   Retinal telangiectasis, 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   OSA (obstructive sleep apnea) 09/18/2019   Cardiac arrhythmia 08/21/2019   Cystoid macular edema of right eye 07/14/2019   Vitreomacular adhesion of left eye 07/14/2019   Cystoid macular edema of left eye 07/14/2019   Vitreomacular adhesion of both eyes 07/14/2019   Retinopathy due to secondary diabetes mellitus, with macular edema, 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HCC) 07/03/2019   CAD S/P percutaneous coronary angioplasty 07/03/2019   CKD (chronic kidney disease) stage 3, GFR 30-59 ml/min (HCC)    Hypertension 05/09/2017   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  Rationale for Evaluation and Treatment: {HABREHAB:27488}  ONSET DATE: ***  SUBJECTIVE:  SUBJECTIVE STATEMENT: *** Hand dominance: {MISC; OT HAND DOMINANCE:[phone]}  PERTINENT HISTORY:  ***  PAIN:  Are you having pain? {OPRCPAIN:27236}  PRECAUTIONS: {Therapy precautions:24002}  RED  FLAGS: {PT Red Flags:29287}     WEIGHT BEARING RESTRICTIONS: {Yes ***/No:24003}  FALLS:  Has patient fallen in last 6 months? {fallsyesno:27318}  LIVING ENVIRONMENT: Lives with: {OPRC lives with:25569::lives with their family} Lives in: {Lives in:25570} Stairs: {opstairs:27293} Has following equipment at home: {Assistive devices:23999}  OCCUPATION: ***  PLOF: {PLOF:24004}  PATIENT GOALS: ***  NEXT MD VISIT: ***  OBJECTIVE:  Note: Objective measures were completed at Evaluation unless otherwise noted.  DIAGNOSTIC FINDINGS:  ***  PATIENT SURVEYS:  {rehab surveys:24030}  COGNITION: Overall cognitive status: {cognition:24006}  SENSATION: {sensation:27233}  POSTURE: {posture:25561}  PALPATION: ***   CERVICAL ROM:   {AROM/PROM:27142} ROM A/PROM (deg) eval  Flexion   Extension   Right lateral flexion   Left lateral flexion   Right rotation   Left rotation    (Blank rows = not tested)  UPPER EXTREMITY ROM:  {AROM/PROM:27142} ROM Right eval Left eval  Shoulder flexion    Shoulder extension    Shoulder abduction    Shoulder adduction    Shoulder extension    Shoulder internal rotation    Shoulder external rotation    Elbow flexion    Elbow extension    Wrist flexion    Wrist extension    Wrist ulnar deviation    Wrist radial deviation    Wrist pronation    Wrist supination     (Blank rows = not tested)  UPPER EXTREMITY MMT:  MMT Right eval Left eval  Shoulder flexion    Shoulder extension    Shoulder abduction    Shoulder adduction    Shoulder extension    Shoulder internal rotation    Shoulder external rotation    Middle trapezius    Lower trapezius    Elbow flexion    Elbow extension    Wrist flexion    Wrist extension    Wrist ulnar deviation    Wrist radial deviation    Wrist pronation    Wrist supination    Grip strength     (Blank rows = not tested)  CERVICAL SPECIAL TESTS:  {Cervical special  tests:25246}  FUNCTIONAL TESTS:  {Functional tests:24029}  TREATMENT DATE: ***                                                                                                                                 PATIENT EDUCATION:  Education details: *** Person educated: {Person educated:25204} Education method: {Education Method:25205} Education comprehension: {Education Comprehension:25206}  HOME EXERCISE PROGRAM: ***  ASSESSMENT:  CLINICAL IMPRESSION: Patient is a *** y.o. *** who was seen today for physical therapy evaluation and treatment for ***.   OBJECTIVE IMPAIRMENTS: {opptimpairments:25111}.   ACTIVITY LIMITATIONS: {activitylimitations:27494}  PARTICIPATION LIMITATIONS: {participationrestrictions:25113}  PERSONAL FACTORS: {Personal factors:25162} are also affecting patient's functional outcome.  REHAB POTENTIAL: {rehabpotential:25112}  CLINICAL DECISION MAKING: {clinical decision making:25114}  EVALUATION COMPLEXITY: {Evaluation complexity:25115}   GOALS: Goals reviewed with patient? {yes/no:20286}  SHORT TERM GOALS: Target date: ***  *** Baseline:  Goal status: INITIAL  2.  *** Baseline:  Goal status: INITIAL  3.  *** Baseline:  Goal status: INITIAL  4.  *** Baseline:  Goal status: INITIAL  5.  *** Baseline:  Goal status: INITIAL  6.  *** Baseline:  Goal status: INITIAL  LONG TERM GOALS: Target date: ***  *** Baseline:  Goal status: INITIAL  2.  *** Baseline:  Goal status: INITIAL  3.  *** Baseline:  Goal status: INITIAL  4.  *** Baseline:  Goal status: INITIAL  5.  *** Baseline:  Goal status: INITIAL  6.  *** Baseline:  Goal status: INITIAL   PLAN:  PT FREQUENCY: {rehab frequency:25116}  PT DURATION: {rehab duration:25117}  PLANNED INTERVENTIONS: {rehab planned interventions:25118::97110-Therapeutic exercises,97530- Therapeutic [phone]- Neuromuscular re-education,97535- Self Rjmz,02859- Manual  therapy,Patient/Family education}  PLAN FOR NEXT SESSION: ***   Greig GORMAN Quivers, PT 04/07/2024, 3:46 PM      "

## 2024-04-09 ENCOUNTER — Ambulatory Visit (HOSPITAL_COMMUNITY)

## 2024-04-10 ENCOUNTER — Encounter: Payer: Self-pay | Admitting: Family Medicine

## 2024-04-10 ENCOUNTER — Other Ambulatory Visit: Payer: Self-pay

## 2024-04-10 DIAGNOSIS — E119 Type 2 diabetes mellitus without complications: Secondary | ICD-10-CM

## 2024-04-10 DIAGNOSIS — N1832 Chronic kidney disease, stage 3b: Secondary | ICD-10-CM

## 2024-04-10 MED ORDER — TIRZEPATIDE 12.5 MG/0.5ML ~~LOC~~ SOAJ: 12.5000 mg | SUBCUTANEOUS | 0 refills | Status: AC

## 2025-03-12 ENCOUNTER — Ambulatory Visit
